# Patient Record
Sex: Male | Born: 1945
Health system: Southern US, Community
[De-identification: ages and names within clinical notes are randomized; demographics above are authoritative.]

## PROBLEM LIST (undated history)

## (undated) DIAGNOSIS — M109 Gout, unspecified: Secondary | ICD-10-CM

## (undated) DIAGNOSIS — Z862 Personal history of diseases of the blood and blood-forming organs and certain disorders involving the immune mechanism: Secondary | ICD-10-CM

## (undated) DIAGNOSIS — E782 Mixed hyperlipidemia: Secondary | ICD-10-CM

## (undated) DIAGNOSIS — N2889 Other specified disorders of kidney and ureter: Secondary | ICD-10-CM

## (undated) DIAGNOSIS — K2270 Barrett's esophagus without dysplasia: Secondary | ICD-10-CM

## (undated) DIAGNOSIS — F419 Anxiety disorder, unspecified: Secondary | ICD-10-CM

## (undated) DIAGNOSIS — C649 Malignant neoplasm of unspecified kidney, except renal pelvis: Secondary | ICD-10-CM

## (undated) DIAGNOSIS — I10 Essential (primary) hypertension: Secondary | ICD-10-CM

## (undated) DIAGNOSIS — R011 Cardiac murmur, unspecified: Secondary | ICD-10-CM

## (undated) DIAGNOSIS — C641 Malignant neoplasm of right kidney, except renal pelvis: Secondary | ICD-10-CM

## (undated) DIAGNOSIS — Z973 Presence of spectacles and contact lenses: Secondary | ICD-10-CM

## (undated) DIAGNOSIS — K219 Gastro-esophageal reflux disease without esophagitis: Secondary | ICD-10-CM

---

## 2012-09-04 HISTORY — PX: INGUINAL HERNIA REPAIR: SUR1180

## 2015-08-30 DIAGNOSIS — K2270 Barrett's esophagus without dysplasia: Secondary | ICD-10-CM | POA: Insufficient documentation

## 2015-08-30 DIAGNOSIS — M109 Gout, unspecified: Secondary | ICD-10-CM | POA: Insufficient documentation

## 2017-05-05 DIAGNOSIS — H18519 Endothelial corneal dystrophy, unspecified eye: Secondary | ICD-10-CM | POA: Insufficient documentation

## 2018-05-15 ENCOUNTER — Emergency Department (HOSPITAL_BASED_OUTPATIENT_CLINIC_OR_DEPARTMENT_OTHER)
Admission: EM | Admit: 2018-05-15 | Discharge: 2018-05-15 | Disposition: A | Discharge status: Home / Self Care | Payer: Medicare Other | Attending: Emergency Medicine | Admitting: Emergency Medicine

## 2018-05-15 ENCOUNTER — Other Ambulatory Visit: Payer: Self-pay

## 2018-05-15 ENCOUNTER — Encounter (HOSPITAL_BASED_OUTPATIENT_CLINIC_OR_DEPARTMENT_OTHER): Payer: Self-pay | Admitting: Emergency Medicine

## 2018-05-15 ENCOUNTER — Emergency Department (HOSPITAL_BASED_OUTPATIENT_CLINIC_OR_DEPARTMENT_OTHER): Payer: Medicare Other

## 2018-05-15 DIAGNOSIS — R319 Hematuria, unspecified: Secondary | ICD-10-CM | POA: Insufficient documentation

## 2018-05-15 DIAGNOSIS — I10 Essential (primary) hypertension: Secondary | ICD-10-CM | POA: Insufficient documentation

## 2018-05-15 DIAGNOSIS — Z7982 Long term (current) use of aspirin: Secondary | ICD-10-CM | POA: Diagnosis not present

## 2018-05-15 DIAGNOSIS — N2889 Other specified disorders of kidney and ureter: Secondary | ICD-10-CM | POA: Diagnosis not present

## 2018-05-15 DIAGNOSIS — Z79899 Other long term (current) drug therapy: Secondary | ICD-10-CM | POA: Diagnosis not present

## 2018-05-15 DIAGNOSIS — Z87891 Personal history of nicotine dependence: Secondary | ICD-10-CM | POA: Insufficient documentation

## 2018-05-15 HISTORY — DX: Gout, unspecified: M10.9

## 2018-05-15 HISTORY — DX: Essential (primary) hypertension: I10

## 2018-05-15 LAB — CBC
HCT: 43.3 % (ref 39.0–52.0)
Hemoglobin: 15.2 g/dL (ref 13.0–17.0)
MCH: 32.2 pg (ref 26.0–34.0)
MCHC: 35.1 g/dL (ref 30.0–36.0)
MCV: 91.7 fL (ref 80.0–100.0)
Platelets: 233 10*3/uL (ref 150–400)
RBC: 4.72 MIL/uL (ref 4.22–5.81)
RDW: 12 % (ref 11.5–15.5)
WBC: 9.7 10*3/uL (ref 4.0–10.5)
nRBC: 0 % (ref 0.0–0.2)

## 2018-05-15 LAB — URINALYSIS, ROUTINE W REFLEX MICROSCOPIC

## 2018-05-15 LAB — BASIC METABOLIC PANEL
Anion gap: 9 (ref 5–15)
BUN: 18 mg/dL (ref 8–23)
CO2: 24 mmol/L (ref 22–32)
Calcium: 8.9 mg/dL (ref 8.9–10.3)
Chloride: 105 mmol/L (ref 98–111)
Creatinine, Ser: 1.28 mg/dL — ABNORMAL HIGH (ref 0.61–1.24)
GFR calc Af Amer: >60 mL/min (ref >60)
GFR calc non Af Amer: 54 mL/min — ABNORMAL LOW (ref >60)
Glucose, Bld: 122 mg/dL — ABNORMAL HIGH (ref 70–99)
Potassium: 3.8 mmol/L (ref 3.5–5.1)
Sodium: 138 mmol/L (ref 135–145)

## 2018-05-15 LAB — URINALYSIS, MICROSCOPIC (REFLEX): RBC / HPF: >50 RBC/hpf (ref 0–5)

## 2018-05-15 LAB — PROTIME-INR
INR: 1
Prothrombin Time: 13.1 seconds (ref 11.4–15.2)

## 2018-05-15 IMAGING — CT CT RENAL STONE PROTOCOL
2 of 4 series · 16 of 46 positions shown, 18 images · non-contrast
Comparison: None.

CLINICAL DATA: Hematuria and lower abdominal pain since this
morning.

EXAM:
CT ABDOMEN AND PELVIS WITHOUT CONTRAST
TECHNIQUE: Multidetector CT imaging of the abdomen and pelvis was performed
following the standard protocol without IV contrast.

[Series 2: axial st · axial · 0.77mm/px · z∈[+751,+1111]mm · 13 of 80 slices shown, 15 images]
[im 4/80  soft-tissue]
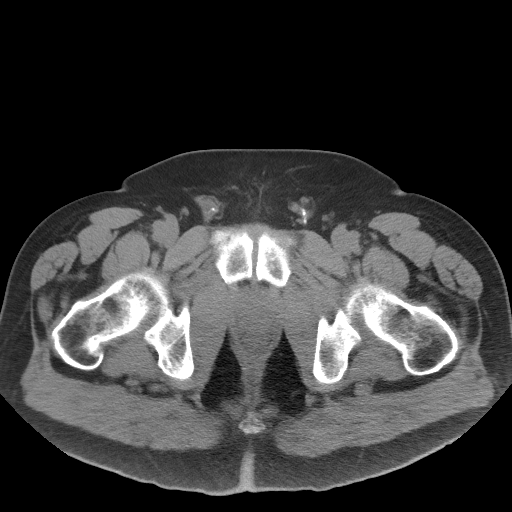
[im 4/80  bone]
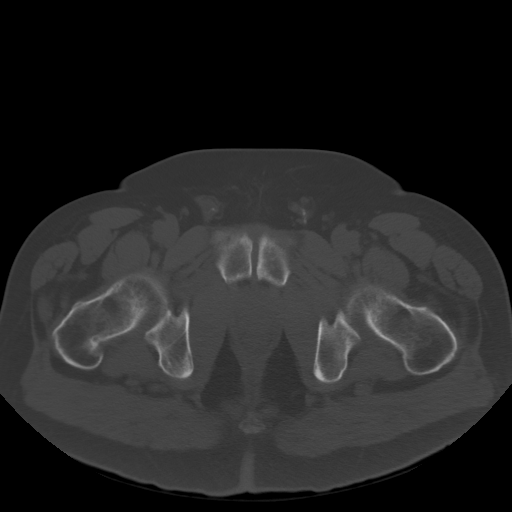
[im 10/80  soft-tissue]
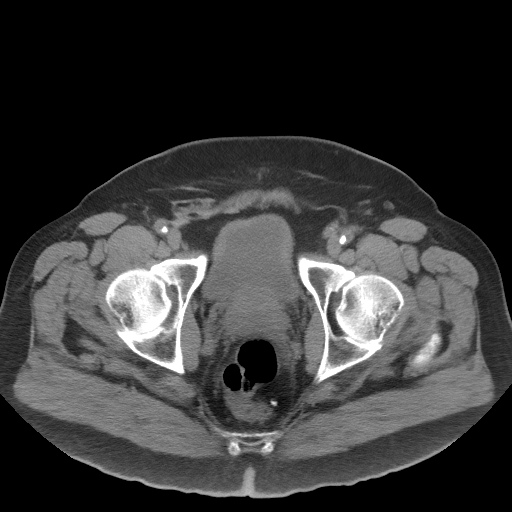
[im 16/80  soft-tissue]
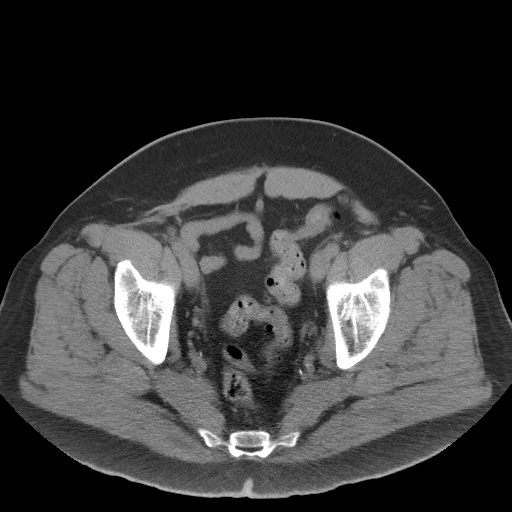
[im 23/80  soft-tissue]
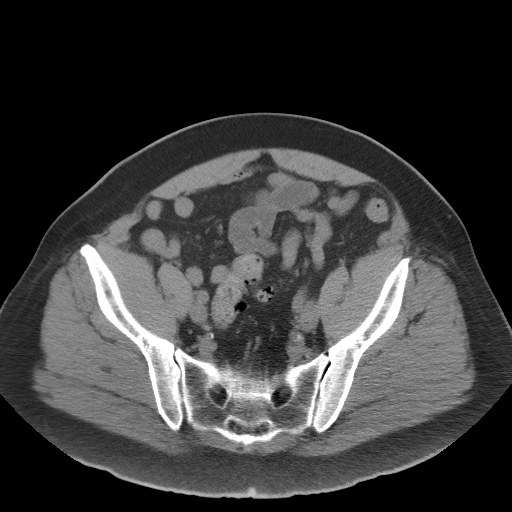
[im 29/80  soft-tissue]
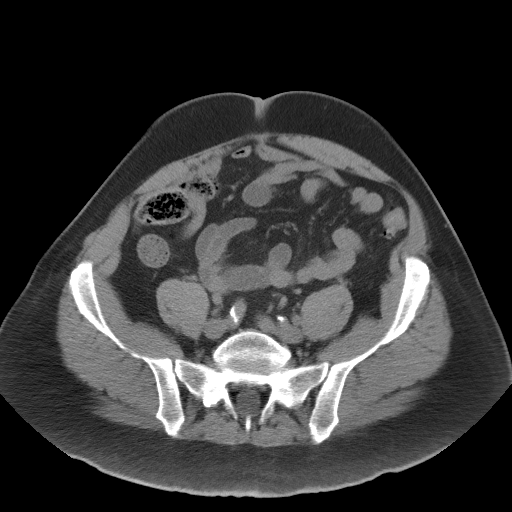
[im 35/80  soft-tissue]
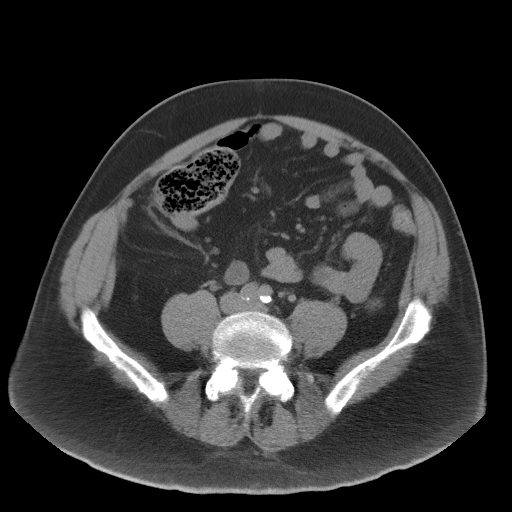
[im 42/80  soft-tissue]
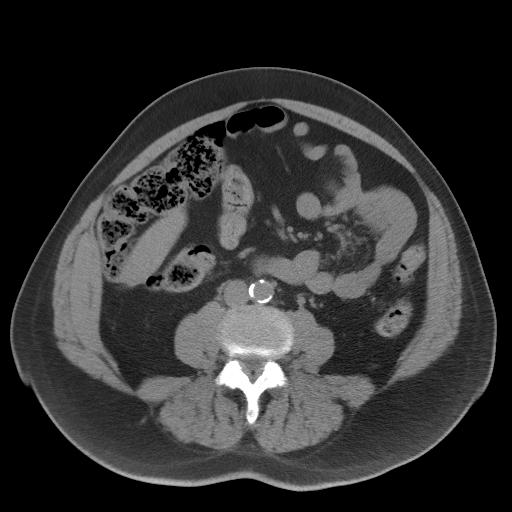
[im 45/80  soft-tissue]
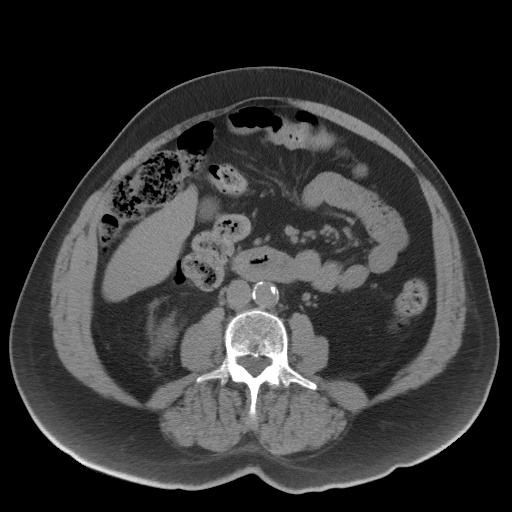
[im 51/80  soft-tissue]
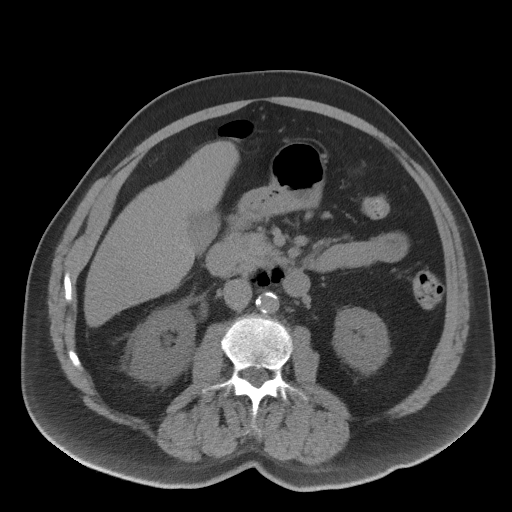
[im 51/80  bone]
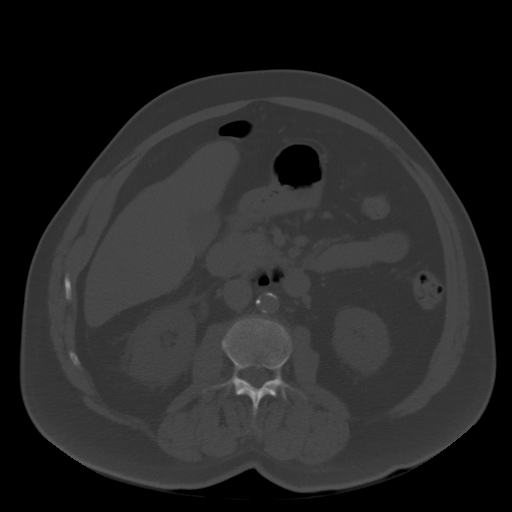
[im 57/80  soft-tissue]
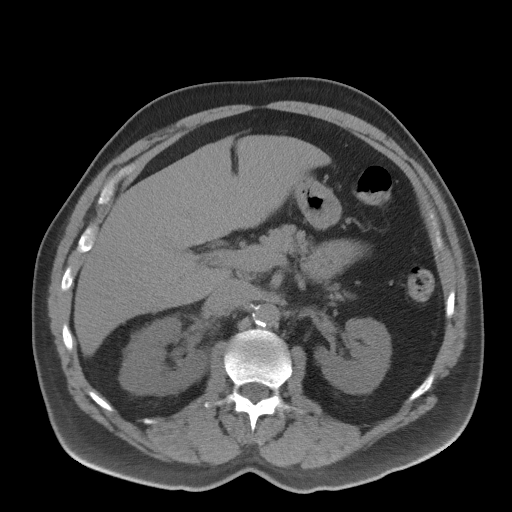
[im 64/80  soft-tissue]
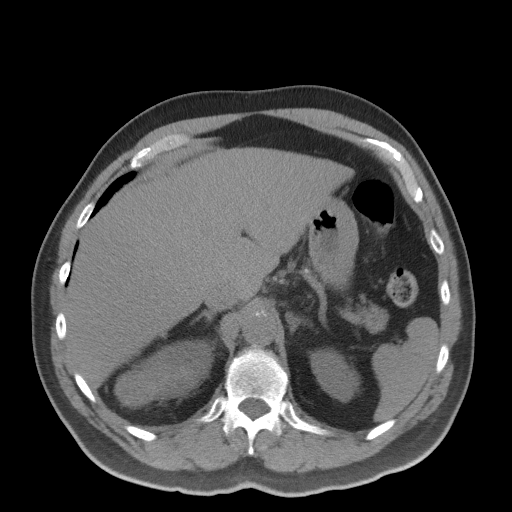
[im 70/80  soft-tissue]
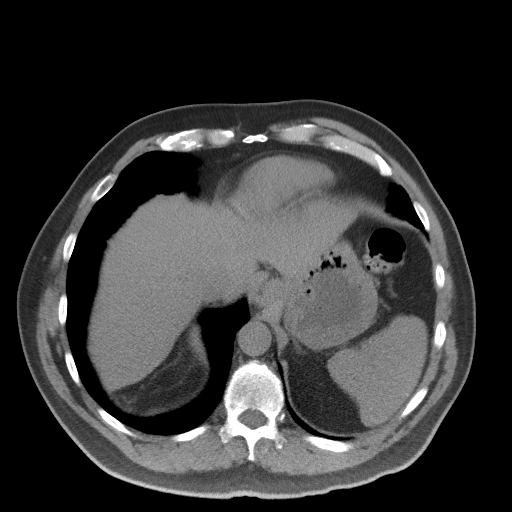
[im 76/80  soft-tissue]
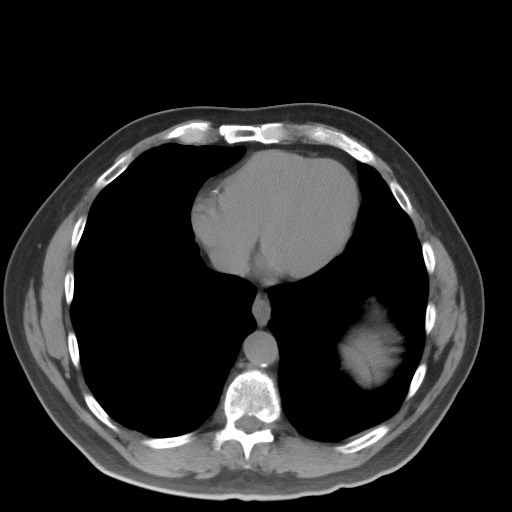

[Series 5: coronal st · coronal · 0.75mm/px · 3 of 107 slices shown]
[im 36/107  soft-tissue]
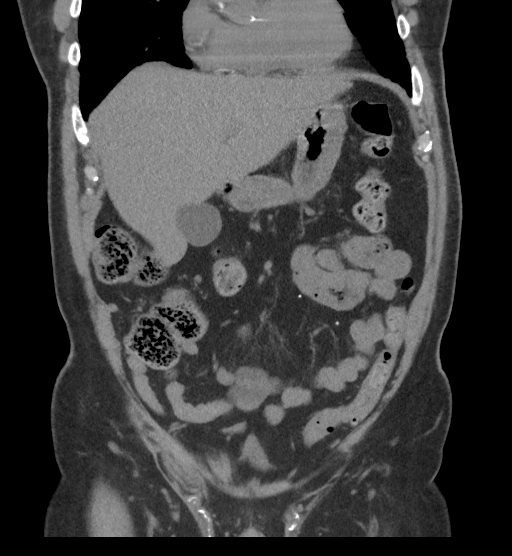
[im 48/107  soft-tissue]
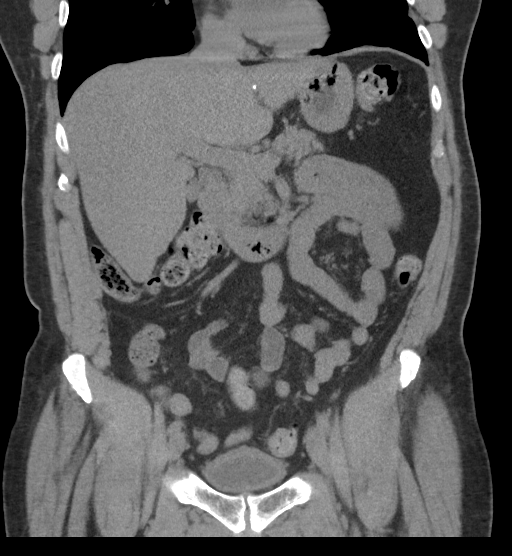
[im 59/107  soft-tissue]
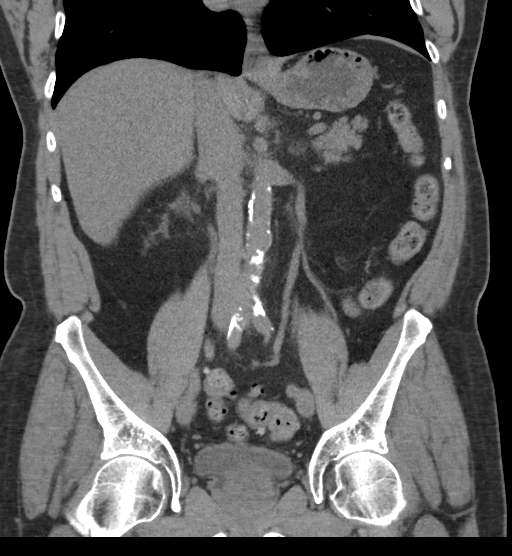

[16 of 46 positions shown; findings below may reference images not displayed]

FINDINGS: Lower chest: 1 cm nodule, right middle lobe. 3 mm nodule, subpleural
left lower lobe. No acute findings.

Hepatobiliary: No focal liver abnormality is seen. No gallstones,
gallbladder wall thickening, or biliary dilatation.

Pancreas: Unremarkable. No pancreatic ductal dilatation or
surrounding inflammatory changes.

Spleen: Normal in size without focal abnormality.

Adrenals/Urinary Tract: No adrenal masses.

Mass arises from the lateral mid to upper pole the right kidney
measuring 5.2 x 3.4 x 3.7 cm. There is mild prominence of the right
intrarenal collecting system and right perinephric stranding. Right
ureter is mildly dilated and there is increased attenuation material
distal right ureter beginning at the pelvic brim, which is likely
hemorrhage. There is no renal or ureteral stone. No other renal
masses. Left renal collecting system and ureter are unremarkable.

Bladder is mildly distended.  No bladder mass or stone.

Stomach/Bowel: Stomach and small bowel unremarkable. There are
scattered left colon diverticula. No diverticulitis or other colonic
inflammatory process. Normal appendix visualized.

Vascular/Lymphatic: Aortic atherosclerosis. No aneurysm. No
adenopathy.

Reproductive: Prominent prostate measuring 4.5 x 3.4 cm
transversely.

Other: No abdominal wall hernia or abnormality. No abdominopelvic
ascites.

Musculoskeletal: No fracture or acute finding. No osteoblastic or
osteolytic lesions.
IMPRESSION: 1. 5.2 cm mass in the right kidney. This is relatively
hyperattenuating measuring 32 Hounsfield units. Renal cell carcinoma
is suspected, which would be better assessed with renal MRI with and
without contrast or post contrast CT of the abdomen. There is mild
prominence of the right intrarenal collecting system with mild
dilation of the right ureter. Increased attenuation material in the
distal right ureter is likely hemorrhage. There is no ureteral or
renal stone. Two lung base nodules, 1 in the right middle lobe, 1
cm, with a 3 mm left lower lobe nodule, concerning for metastatic
disease.
2. No other evidence of metastatic disease. No other findings to
account for pain or hematuria.
3. Aortic atherosclerosis. Colonic diverticula without
diverticulitis.

## 2018-05-15 MED ORDER — HYDROCODONE-ACETAMINOPHEN 5-325 MG PO TABS: 1.0000 | ORAL_TABLET | ORAL | 0 refills | Status: DC | PRN

## 2018-05-15 MED ORDER — KETOROLAC TROMETHAMINE 30 MG/ML IJ SOLN
15.0000 mg | Freq: Once | INTRAMUSCULAR | Status: AC
  Administered 2018-05-15: 15 mg via INTRAVENOUS
  Filled 2018-05-15: qty 1

## 2018-05-15 MED ORDER — MORPHINE SULFATE (PF) 4 MG/ML IV SOLN
4.0000 mg | Freq: Once | INTRAVENOUS | Status: AC
  Administered 2018-05-15: 4 mg via INTRAVENOUS
  Filled 2018-05-15: qty 1

## 2018-05-15 MED ORDER — ONDANSETRON HCL 4 MG/2ML IJ SOLN
4.0000 mg | Freq: Once | INTRAMUSCULAR | Status: AC
  Administered 2018-05-15: 4 mg via INTRAVENOUS
  Filled 2018-05-15: qty 2

## 2018-05-15 NOTE — ED Notes (Signed)
ED Provider at bedside. 

## 2018-05-15 NOTE — ED Notes (Signed)
Pt on auto VS  

## 2018-05-15 NOTE — ED Provider Notes (Signed)
[facility] Provider Note   CSN: [id number] Arrival date & time: 05/15/18  0935     History   Chief Complaint Chief Complaint  Patient presents with  . Hematuria    HPI James Massey is a 72 y.o. male.  HPI Patient presented to the emergency room for evaluation hematuria.  Patient states he noticed it this morning.  The urine is grossly bloody.  He mentions and there is having some right lower quadrant discomfort but denies any pain to me.  He denies any fevers or chills.  No history of prior hematuria.  No recent injuries.  No known history of kidney stones or bladder polyps. Past Medical History:  Diagnosis Date  . Gout   . High cholesterol   . Hypertension     There are no active problems to display for this patient.   Past Surgical History:  Procedure Laterality Date  . HERNIA REPAIR          Home Medications    Prior to Admission medications   Medication Sig Start Date End Date Taking? Authorizing Provider  allopurinol (ZYLOPRIM) 300 MG tablet Take 300 mg by mouth daily.   Yes [provider]  aspirin EC 81 MG tablet Take 81 mg by mouth daily.   Yes [provider]  atorvastatin (LIPITOR) 40 MG tablet Take 40 mg by mouth daily.   Yes [provider]  diazepam (VALIUM) 5 MG tablet Take 5 mg by mouth every 12 (twelve) hours as needed for anxiety. As needed (usually break it in half)   Yes [provider]  indomethacin (INDOCIN) 50 MG capsule Take 50 mg by mouth 3 (three) times daily as needed.   Yes [provider]  pantoprazole (PROTONIX) 40 MG tablet Take 40 mg by mouth 2 (two) times daily.   Yes [provider]  simethicone (MYLICON) 458 MG chewable tablet Chew 180 mg by mouth every 6 (six) hours as needed for flatulence. OTC for stomach gas relief   Yes [provider]  HYDROcodone-acetaminophen (NORCO/VICODIN) 5-325 MG tablet Take 1 tablet by mouth every 4 (four) hours  as needed. 05/15/18   Dorie Rank, MD    Family History No family history on file.  Social History Social History   Tobacco Use  . Smoking status: Former Research scientist (life sciences)  . Smokeless tobacco: Never Used  Substance Use Topics  . Alcohol use: Yes  . Drug use: Never     Allergies   Clindamycin/lincomycin   Review of Systems Review of Systems  Constitutional: Negative for fever.  Respiratory: Negative for shortness of breath.   Cardiovascular: Negative for chest pain.  All other systems reviewed and are negative.    Physical Exam Updated Vital Signs BP (!) 143/89 (BP Location: Left Arm)   Pulse 70   Temp 98.2 F (36.8 C) (Oral)   Resp 18   Ht 1.753 m (5\' 9" )   Wt 100.7 kg   SpO2 94%   BMI 32.78 kg/m   Physical Exam  Constitutional: He appears well-developed and well-nourished. No distress.  HENT:  Head: Normocephalic and atraumatic.  Right Ear: External ear normal.  Left Ear: External ear normal.  Eyes: Conjunctivae are normal. Right eye exhibits no discharge. Left eye exhibits no discharge. No scleral icterus.  Neck: Neck supple. No tracheal deviation present.  Cardiovascular: Normal rate, regular rhythm and intact distal pulses.  Pulmonary/Chest: Effort normal and breath sounds normal. No stridor. No respiratory distress. He has no  wheezes. He has no rales.  Abdominal: Soft. Bowel sounds are normal. He exhibits no distension. There is no tenderness. There is no rebound and no guarding.  Musculoskeletal: He exhibits no edema or tenderness.  Neurological: He is alert. He has normal strength. No cranial nerve deficit (no facial droop, extraocular movements intact, no slurred speech) or sensory deficit. He exhibits normal muscle tone. He displays no seizure activity. Coordination normal.  Skin: Skin is warm and dry. No rash noted.  Psychiatric: He has a normal mood and affect.  Nursing note and vitals reviewed.    ED Treatments / Results  Labs (all labs ordered are  listed, but only abnormal results are displayed) Labs Reviewed  URINALYSIS, ROUTINE W REFLEX MICROSCOPIC - Abnormal; Notable for the following components:      Result Value   Color, Urine RED (*)    APPearance TURBID (*)    Glucose, UA   (*)    Value: TEST NOT REPORTED DUE TO COLOR INTERFERENCE OF URINE PIGMENT   Hgb urine dipstick   (*)    Value: TEST NOT REPORTED DUE TO COLOR INTERFERENCE OF URINE PIGMENT   Bilirubin Urine   (*)    Value: TEST NOT REPORTED DUE TO COLOR INTERFERENCE OF URINE PIGMENT   Ketones, ur   (*)    Value: TEST NOT REPORTED DUE TO COLOR INTERFERENCE OF URINE PIGMENT   Protein, ur   (*)    Value: TEST NOT REPORTED DUE TO COLOR INTERFERENCE OF URINE PIGMENT   Nitrite   (*)    Value: TEST NOT REPORTED DUE TO COLOR INTERFERENCE OF URINE PIGMENT   Leukocytes, UA   (*)    Value: TEST NOT REPORTED DUE TO COLOR INTERFERENCE OF URINE PIGMENT   All other components within normal limits  BASIC METABOLIC PANEL - Abnormal; Notable for the following components:   Glucose, Bld 122 (*)    Creatinine, Ser 1.28 (*)    GFR calc non Af Amer 54 (*)    All other components within normal limits  URINALYSIS, MICROSCOPIC (REFLEX) - Abnormal; Notable for the following components:   Bacteria, UA MANY (*)    All other components within normal limits  URINE CULTURE  CBC  PROTIME-INR     Radiology Ct Renal Stone Study  Result Date: 05/15/2018 CLINICAL DATA:  Hematuria and lower abdominal pain since this morning. EXAM: CT ABDOMEN AND PELVIS WITHOUT CONTRAST TECHNIQUE: Multidetector CT imaging of the abdomen and pelvis was performed following the standard protocol without IV contrast. COMPARISON:  None. FINDINGS: Lower chest: 1 cm nodule, right middle lobe. 3 mm nodule, subpleural left lower lobe. No acute findings. Hepatobiliary: No focal liver abnormality is seen. No gallstones, gallbladder wall thickening, or biliary dilatation. Pancreas: Unremarkable. No pancreatic ductal dilatation  or surrounding inflammatory changes. Spleen: Normal in size without focal abnormality. Adrenals/Urinary Tract: No adrenal masses. Mass arises from the lateral mid to upper pole the right kidney measuring 5.2 x 3.4 x 3.7 cm. There is mild prominence of the right intrarenal collecting system and right perinephric stranding. Right ureter is mildly dilated and there is increased attenuation material distal right ureter beginning at the pelvic brim, which is likely hemorrhage. There is no renal or ureteral stone. No other renal masses. Left renal collecting system and ureter are unremarkable. Bladder is mildly distended.  No bladder mass or stone. Stomach/Bowel: Stomach and small bowel unremarkable. There are scattered left colon diverticula. No diverticulitis or other colonic inflammatory process. Normal appendix visualized.  Vascular/Lymphatic: Aortic atherosclerosis. No aneurysm. No adenopathy. Reproductive: Prominent prostate measuring 4.5 x 3.4 cm transversely. Other: No abdominal wall hernia or abnormality. No abdominopelvic ascites. Musculoskeletal: No fracture or acute finding. No osteoblastic or osteolytic lesions. IMPRESSION: 1. 5.2 cm mass in the right kidney. This is relatively hyperattenuating measuring 32 Hounsfield units. Renal cell carcinoma is suspected, which would be better assessed with renal MRI with and without contrast or post contrast CT of the abdomen. There is mild prominence of the right intrarenal collecting system with mild dilation of the right ureter. Increased attenuation material in the distal right ureter is likely hemorrhage. There is no ureteral or renal stone. Two lung base nodules, 1 in the right middle lobe, 1 cm, with a 3 mm left lower lobe nodule, concerning for metastatic disease. 2. No other evidence of metastatic disease. No other findings to account for pain or hematuria. 3. Aortic atherosclerosis. Colonic diverticula without diverticulitis. Electronically Signed   By: Lajean Manes M.D.   On: 05/15/2018 10:54    Procedures Procedures (including critical care time)  Medications Ordered in ED Medications  ketorolac (TORADOL) 30 MG/ML injection 15 mg (has no administration in time range)  morphine 4 MG/ML injection 4 mg (4 mg Intravenous Given 05/15/18 1100)  ondansetron (ZOFRAN) injection 4 mg (4 mg Intravenous Given 05/15/18 1100)     Initial Impression / Assessment and Plan / ED Course  I have reviewed the triage vital signs and the nursing notes.  Pertinent labs & imaging results that were available during my care of the patient were reviewed by me and considered in my medical decision making (see chart for details).  Clinical Course as of May 15 1236  Sat May 15, 2018  1049 Now complains of pain   [JK]  1138 Labs reviewed.  Some bacturia, primarily hematuria.  CT scan shows renal mass.   [JK]    Clinical Course User Index [JK] Dorie Rank, MD   Patient presented to the emergency room for evaluation of hematuria.  Unfortunately his CT scan shows a right renal mass concerning for renal cell carcinoma.  Patient's urinalysis does show some bacteria but I doubt infection.  I will send off a urine culture.  I discussed the case with Dr. Alyson Ingles.  He would like to see the patient in the office this week.  I discussed the findings with the patient including the possibility of renal cell carcinoma and the need for follow-up.  He will be discharged home with prescription for hydrocodone to help him with his pain.   Discussed need to return to the ED if he is unable to urinate  Final Clinical Impressions(s) / ED Diagnoses   Final diagnoses:  Hematuria  Renal mass, right    ED Discharge Orders         Ordered    HYDROcodone-acetaminophen (NORCO/VICODIN) 5-325 MG tablet  Every 4 hours PRN     05/15/18 1236           Dorie Rank, MD 05/15/18 1239

## 2018-05-15 NOTE — ED Triage Notes (Signed)
Pt in for blood in urine x 2 onset this morning. Pt c/o Right lower quadrant pain. Pt reports some nausea.

## 2018-05-15 NOTE — Discharge Instructions (Addendum)
CT scan showed a 5 cm mass in the right kidney.  This is concerning for the possibility of renal carcinoma.  This will need to be evaluated further.  I spoke with Dr Alyson Ingles today.  Call his office to arrange a follow up appointment next week.   Return to an ED if you start to have difficulty urinating and cant empty your bladder.

## 2018-05-17 LAB — URINE CULTURE: Culture: NO GROWTH

## 2018-05-18 ENCOUNTER — Other Ambulatory Visit: Payer: Self-pay | Admitting: Urology

## 2018-05-19 ENCOUNTER — Encounter (HOSPITAL_COMMUNITY): Payer: Self-pay | Admitting: *Deleted

## 2018-05-19 ENCOUNTER — Other Ambulatory Visit: Payer: Self-pay

## 2018-05-20 NOTE — Anesthesia Preprocedure Evaluation (Addendum)
Anesthesia Evaluation  Patient identified by MRN, date of birth, ID band Patient awake    Reviewed: Allergy & Precautions, NPO status , Patient's Chart, lab work & pertinent test results  Airway Mallampati: II  TM Distance: >3 FB Neck ROM: Full    Dental no notable dental hx. (+) Caps, Teeth Intact, Dental Advisory Given   Pulmonary former smoker,    Pulmonary exam normal breath sounds clear to auscultation       Cardiovascular Exercise Tolerance: Good hypertension, Pt. on medications Normal cardiovascular exam Rhythm:Regular Rate:Normal  05/21/18 EKG NSR   Neuro/Psych Anxiety    GI/Hepatic Neg liver ROS, GERD  Medicated and Controlled,  Endo/Other    Renal/GU      Musculoskeletal negative musculoskeletal ROS (+) Hx of gout   Abdominal (+) + obese,   Peds  Hematology negative hematology ROS (+)   Anesthesia Other Findings   Reproductive/Obstetrics                            Lab Results  Component Value Date   CREATININE 1.28 (H) 05/15/2018   BUN 18 05/15/2018   NA 138 05/15/2018   K 3.8 05/15/2018   CL 105 05/15/2018   CO2 24 05/15/2018    Lab Results  Component Value Date   WBC 9.7 05/15/2018   HGB 15.2 05/15/2018   HCT 43.3 05/15/2018   MCV 91.7 05/15/2018   PLT 233 05/15/2018    Anesthesia Physical Anesthesia Plan  ASA: II  Anesthesia Plan: General   Post-op Pain Management:    Induction: Intravenous  PONV Risk Score and Plan: 2 and Treatment may vary due to age or medical condition, Ondansetron and Dexamethasone  Airway Management Planned: LMA  Additional Equipment:   Intra-op Plan:   Post-operative Plan:   Informed Consent: I have reviewed the patients History and Physical, chart, labs and discussed the procedure including the risks, benefits and alternatives for the proposed anesthesia with the patient or authorized representative who has indicated  his/her understanding and acceptance.   Dental advisory given  Plan Discussed with: CRNA  Anesthesia Plan Comments:         Anesthesia Quick Evaluation

## 2018-05-21 ENCOUNTER — Ambulatory Visit (HOSPITAL_COMMUNITY): Payer: Medicare Other

## 2018-05-21 ENCOUNTER — Other Ambulatory Visit: Payer: Self-pay

## 2018-05-21 ENCOUNTER — Ambulatory Visit (HOSPITAL_COMMUNITY): Payer: Medicare Other | Admitting: Anesthesiology

## 2018-05-21 ENCOUNTER — Encounter (HOSPITAL_COMMUNITY)
Admission: RE | Disposition: A | Discharge status: Home / Self Care | Payer: Self-pay | Source: Ambulatory Visit | Attending: Urology

## 2018-05-21 ENCOUNTER — Encounter (HOSPITAL_COMMUNITY): Payer: Self-pay | Admitting: *Deleted

## 2018-05-21 ENCOUNTER — Ambulatory Visit (HOSPITAL_COMMUNITY)
Admission: RE | Admit: 2018-05-21 | Discharge: 2018-05-21 | Disposition: A | Discharge status: Home / Self Care | Payer: Medicare Other | Source: Ambulatory Visit | Attending: Urology | Admitting: Urology

## 2018-05-21 DIAGNOSIS — F419 Anxiety disorder, unspecified: Secondary | ICD-10-CM | POA: Insufficient documentation

## 2018-05-21 DIAGNOSIS — K219 Gastro-esophageal reflux disease without esophagitis: Secondary | ICD-10-CM | POA: Diagnosis not present

## 2018-05-21 DIAGNOSIS — I10 Essential (primary) hypertension: Secondary | ICD-10-CM | POA: Insufficient documentation

## 2018-05-21 DIAGNOSIS — Z79899 Other long term (current) drug therapy: Secondary | ICD-10-CM | POA: Diagnosis not present

## 2018-05-21 DIAGNOSIS — M109 Gout, unspecified: Secondary | ICD-10-CM | POA: Diagnosis not present

## 2018-05-21 DIAGNOSIS — Z7982 Long term (current) use of aspirin: Secondary | ICD-10-CM | POA: Insufficient documentation

## 2018-05-21 DIAGNOSIS — N2889 Other specified disorders of kidney and ureter: Secondary | ICD-10-CM | POA: Diagnosis not present

## 2018-05-21 DIAGNOSIS — R310 Gross hematuria: Secondary | ICD-10-CM | POA: Diagnosis not present

## 2018-05-21 DIAGNOSIS — Z87891 Personal history of nicotine dependence: Secondary | ICD-10-CM | POA: Diagnosis not present

## 2018-05-21 HISTORY — PX: CYSTOSCOPY WITH RETROGRADE PYELOGRAM, URETEROSCOPY AND STENT PLACEMENT: SHX5789

## 2018-05-21 HISTORY — DX: Cardiac murmur, unspecified: R01.1

## 2018-05-21 HISTORY — DX: Anxiety disorder, unspecified: F41.9

## 2018-05-21 SURGERY — CYSTOURETEROSCOPY, WITH RETROGRADE PYELOGRAM AND STENT INSERTION
Anesthesia: General | Site: Ureter | Laterality: Right

## 2018-05-21 MED ORDER — BELLADONNA ALKALOIDS-OPIUM 16.2-60 MG RE SUPP
RECTAL | Status: DC | PRN
  Administered 2018-05-21: 1 via RECTAL

## 2018-05-21 MED ORDER — ONDANSETRON HCL 4 MG/2ML IJ SOLN
INTRAMUSCULAR | Status: AC
  Filled 2018-05-21: qty 2

## 2018-05-21 MED ORDER — BELLADONNA ALKALOIDS-OPIUM 16.2-30 MG RE SUPP
RECTAL | Status: AC
  Filled 2018-05-21: qty 1

## 2018-05-21 MED ORDER — SODIUM CHLORIDE 0.9 % IR SOLN
Status: DC | PRN
  Administered 2018-05-21: 1000 mL

## 2018-05-21 MED ORDER — SODIUM CHLORIDE 0.9 % IR SOLN
Status: DC | PRN
  Administered 2018-05-21: 4000 mL

## 2018-05-21 MED ORDER — DEXAMETHASONE SODIUM PHOSPHATE 10 MG/ML IJ SOLN
INTRAMUSCULAR | Status: AC
  Filled 2018-05-21: qty 1

## 2018-05-21 MED ORDER — PHENAZOPYRIDINE HCL 200 MG PO TABS: 200.0000 mg | ORAL_TABLET | Freq: Three times a day (TID) | ORAL | 0 refills | Status: DC | PRN

## 2018-05-21 MED ORDER — LIDOCAINE 2% (20 MG/ML) 5 ML SYRINGE
INTRAMUSCULAR | Status: DC | PRN
  Administered 2018-05-21: 100 mg via INTRAVENOUS

## 2018-05-21 MED ORDER — PROPOFOL 10 MG/ML IV BOLUS
INTRAVENOUS | Status: AC
  Filled 2018-05-21: qty 20

## 2018-05-21 MED ORDER — DEXAMETHASONE SODIUM PHOSPHATE 10 MG/ML IJ SOLN
INTRAMUSCULAR | Status: DC | PRN
  Administered 2018-05-21: 10 mg via INTRAVENOUS

## 2018-05-21 MED ORDER — TRAMADOL HCL 50 MG PO TABS: 50.0000 mg | ORAL_TABLET | Freq: Four times a day (QID) | ORAL | 0 refills | Status: DC | PRN

## 2018-05-21 MED ORDER — SODIUM CHLORIDE 0.9 % IV SOLN
INTRAVENOUS | Status: DC | PRN
  Administered 2018-05-21: 20 mL

## 2018-05-21 MED ORDER — FENTANYL CITRATE (PF) 100 MCG/2ML IJ SOLN: 25.0000 ug | INTRAMUSCULAR | Status: DC | PRN

## 2018-05-21 MED ORDER — ONDANSETRON HCL 4 MG/2ML IJ SOLN: 4.0000 mg | Freq: Once | INTRAMUSCULAR | Status: DC | PRN

## 2018-05-21 MED ORDER — FENTANYL CITRATE (PF) 100 MCG/2ML IJ SOLN
INTRAMUSCULAR | Status: AC
  Filled 2018-05-21: qty 2

## 2018-05-21 MED ORDER — PROPOFOL 10 MG/ML IV BOLUS
INTRAVENOUS | Status: DC | PRN
  Administered 2018-05-21: 200 mg via INTRAVENOUS

## 2018-05-21 MED ORDER — CEFAZOLIN SODIUM-DEXTROSE 2-4 GM/100ML-% IV SOLN
2.0000 g | INTRAVENOUS | Status: AC
  Administered 2018-05-21: 2 g via INTRAVENOUS
  Filled 2018-05-21: qty 100

## 2018-05-21 MED ORDER — FENTANYL CITRATE (PF) 100 MCG/2ML IJ SOLN
INTRAMUSCULAR | Status: DC | PRN
  Administered 2018-05-21 (×4): 25 ug via INTRAVENOUS

## 2018-05-21 MED ORDER — LACTATED RINGERS IV SOLN
INTRAVENOUS | Status: DC
  Administered 2018-05-21: 10:00:00 via INTRAVENOUS

## 2018-05-21 MED ORDER — KETOROLAC TROMETHAMINE 15 MG/ML IJ SOLN: 15.0000 mg | Freq: Once | INTRAMUSCULAR | Status: DC | PRN

## 2018-05-21 MED ORDER — ACETAMINOPHEN 10 MG/ML IV SOLN: 1000.0000 mg | Freq: Once | INTRAVENOUS | Status: DC | PRN

## 2018-05-21 MED ORDER — LIDOCAINE 2% (20 MG/ML) 5 ML SYRINGE
INTRAMUSCULAR | Status: AC
  Filled 2018-05-21: qty 5

## 2018-05-21 MED ORDER — ONDANSETRON HCL 4 MG/2ML IJ SOLN
INTRAMUSCULAR | Status: DC | PRN
  Administered 2018-05-21: 4 mg via INTRAVENOUS

## 2018-05-21 SURGICAL SUPPLY — 21 items
BAG URO CATCHER STRL LF (MISCELLANEOUS) ×2 IMPLANT
CATH URET 5FR 28IN OPEN ENDED (CATHETERS) ×2 IMPLANT
CATH URET DUAL LUMEN 6-10FR 50 (CATHETERS) ×2 IMPLANT
CLOTH BEACON ORANGE TIMEOUT ST (SAFETY) ×2 IMPLANT
GLOVE BIO SURGEON STRL SZ 6.5 (GLOVE) ×2 IMPLANT
GLOVE BIOGEL M STRL SZ7.5 (GLOVE) ×2 IMPLANT
GLOVE BIOGEL PI IND STRL 7.0 (GLOVE) ×1 IMPLANT
GLOVE BIOGEL PI INDICATOR 7.0 (GLOVE) ×1
GOWN STRL REUS W/ TWL LRG LVL3 (GOWN DISPOSABLE) ×1 IMPLANT
GOWN STRL REUS W/TWL LRG LVL3 (GOWN DISPOSABLE) ×1
GOWN STRL REUS W/TWL XL LVL3 (GOWN DISPOSABLE) ×2 IMPLANT
GUIDEWIRE ANG ZIPWIRE 038X150 (WIRE) IMPLANT
GUIDEWIRE STR DUAL SENSOR (WIRE) ×4 IMPLANT
KIT BALLIN UROMAX 15FX10 (LABEL) ×1 IMPLANT
MANIFOLD NEPTUNE II (INSTRUMENTS) ×2 IMPLANT
PACK CYSTO (CUSTOM PROCEDURE TRAY) ×2 IMPLANT
SET HIGH PRES BAL DIL (LABEL) ×1
STENT URET 6FRX26 CONTOUR (STENTS) ×2 IMPLANT
TUBING CONNECTING 10 (TUBING) ×2 IMPLANT
TUBING UROLOGY SET (TUBING) ×2 IMPLANT
WIRE COONS/BENSON .038X145CM (WIRE) IMPLANT

## 2018-05-21 NOTE — Transfer of Care (Signed)
Immediate Anesthesia Transfer of Care Note  Patient: James Massey  Procedure(s) Performed: RIGHT RETROGRADE PYELOGRAM, RIGHT DIAGNOSTIC URETEROSCOPY AND STENT PLACEMENT (Right Ureter)  Patient Location: PACU  Anesthesia Type:General  Level of Consciousness: awake, alert  and oriented  Airway & Oxygen Therapy: Patient Spontanous Breathing and Patient connected to face mask oxygen  Post-op Assessment: Report given to RN and Post -op Vital signs reviewed and stable  Post vital signs: Reviewed and stable  Last Vitals:  Vitals Value Taken Time  BP 137/89 05/21/2018  1:12 PM  Temp    Pulse 74 05/21/2018  1:13 PM  Resp 17 05/21/2018  1:13 PM  SpO2 100 % 05/21/2018  1:13 PM  Vitals shown include unvalidated device data.  Last Pain:  Vitals:   05/21/18 0929  TempSrc: Oral  PainSc: 0-No pain         Complications: No apparent anesthesia complications

## 2018-05-21 NOTE — H&P (View-Only) (Signed)
Renal Mass  HPI: James Massey is a 72 year-old male established patient who is here further eval and management of a renal mass.  The mass is on the right side.   The lesion(s) was first noted on 05/15/2018. The mass was seen on CT Scan.   His symptoms include nausea and blood in urine. Patient denies having flank pain, back pain, groin pain, vomiting, fever, and chills. He has seen blood in his urine. He does have a good appetite. He is not having pain in new locations. He has not recently had unwanted weight loss.   He has not had previous abdominal surgery. The patient can walk a flight of steps.   The patient denies history of diabetes, heart attack or stroke. There is not a a family history of kidney cancer. There is no family history of brain tumors (AMLs), seizures or brain aneurysm's.   The patient's mass was seen as a workup for blood in his urine. He was relatively asymptomatic at the time. It was a noncontrast CT scan. His creatinine was normal at that time, 1.26.   The patient has a history of high blood pressure and gout. He has a past surgical history of bilateral inguinal hernia repair. He is otherwise quite normal with no history of heart attack or diabetes.   Interval: The patient presented today for further evaluation after undergoing a contrast enhanced CT scan with delayed images. He's not had any ongoing flank pain or hematuria. He otherwise feels well.     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  Does not drink caffeine.    REVIEW OF SYSTEMS:    GU Review Male:   Patient denies frequent  urination, hard to postpone urination, burning/ pain with urination, get up at night to urinate, leakage of urine, stream starts and stops, trouble starting your stream, have to strain to urinate , erection problems, and penile pain.  Gastrointestinal (Upper):   Patient reports indigestion/ heartburn. Patient denies nausea and vomiting.  Gastrointestinal (Lower):   Patient denies diarrhea and constipation.  Constitutional:   Patient denies fever, night sweats, weight loss, and fatigue.  Skin:   Patient denies skin rash/ lesion and itching.  Eyes:   Patient denies blurred vision and double vision.  Ears/ Nose/ Throat:   Patient denies sore throat and sinus problems.  Hematologic/Lymphatic:   Patient denies swollen glands and easy bruising.  Cardiovascular:   Patient denies leg swelling and chest pains.  Respiratory:   Patient denies cough and shortness of breath.  Endocrine:   Patient denies excessive thirst.  Musculoskeletal:   Patient denies back pain and joint pain.  Neurological:   Patient denies headaches and dizziness.  Psychologic:   Patient denies depression and anxiety.   VITAL SIGNS:      05/18/2018 12:21 PM  BP 126/85 mmHg  Pulse 86 /min   MULTI-SYSTEM PHYSICAL EXAMINATION:    Constitutional: Well-nourished. No physical deformities. Normally developed. Good grooming.  Neck: Neck symmetrical, not swollen. Normal tracheal position.  Respiratory: Normal breath sounds. No labored breathing, no  use of accessory muscles.   Cardiovascular: Regular rate and rhythm. No murmur, no gallop. Normal temperature, normal extremity pulses, no swelling, no varicosities.   Lymphatic: No enlargement of neck, axillae, groin.  Skin: No paleness, no jaundice, no cyanosis. No lesion, no ulcer, no rash.  Neurologic / Psychiatric: Oriented to time, oriented to place, oriented to person. No depression, no anxiety, no agitation.  Gastrointestinal: No mass, no tenderness, no rigidity, non obese abdomen.   Eyes: Normal conjunctivae. Normal eyelids.  Ears, Nose, Mouth, and Throat: Left ear no scars, no lesions, no masses. Right ear no scars, no lesions, no masses. Nose no scars, no lesions, no masses. Normal hearing. Normal lips.  Musculoskeletal: Normal gait and station of head and neck.     PAST DATA REVIEWED:  Source Of History:  Patient  Records Review:   Pathology Reports, Previous Doctor Records, Previous Patient Records, POC Tool  X-Ray Review: C.T. Abdomen/Pelvis: Reviewed Films. Discussed With Patient.     PROCEDURES:          Urinalysis w/Scope Dipstick Dipstick Cont'd Micro  Color: Yellow Bilirubin: Neg mg/dL WBC/hpf: 0 - 5/hpf  Appearance: Clear Ketones: Neg mg/dL RBC/hpf: 40 - 60/hpf  Specific Gravity: 1.020 Blood: 3+ ery/uL Bacteria: Rare (0-9/hpf)  pH: 5.5 Protein: 1+ mg/dL Cystals: NS (Not Seen)  Glucose: Neg mg/dL Urobilinogen: 0.2 mg/dL Casts: NS (Not Seen)    Nitrites: Neg Trichomonas: Not Present    Leukocyte Esterase: Neg leu/uL Mucous: Present      Epithelial Cells: NS (Not Seen)      Yeast: NS (Not Seen)      Sperm: Not Present    ASSESSMENT:      ICD-10 Details  1 GU:   Benign Neo Kidney, Unspec - D30.00    PLAN:           Document Letter(s):  Created for Patient: Clinical Summary         Notes:   Our plan is to proceed with ureteroscopy to have a good look in the right collecting system to ensure that there are no masses emanating from the urinary tract. This is scheduled for Friday. We will then proceed with nephrectomy or nephroureterectomy on 11/27.

## 2018-05-21 NOTE — H&P (Signed)
Renal Mass  HPI: James Massey is a 72 year-old male established patient who is here further eval and management of a renal mass.  The mass is on the right side.   The lesion(s) was first noted on 05/15/2018. The mass was seen on CT Scan.   His symptoms include nausea and blood in urine. Patient denies having flank pain, back pain, groin pain, vomiting, fever, and chills. He has seen blood in his urine. He does have a good appetite. He is not having pain in new locations. He has not recently had unwanted weight loss.   He has not had previous abdominal surgery. The patient can walk a flight of steps.   The patient denies history of diabetes, heart attack or stroke. There is not a a family history of kidney cancer. There is no family history of brain tumors (AMLs), seizures or brain aneurysm's.   The patient's mass was seen as a workup for blood in his urine. He was relatively asymptomatic at the time. It was a noncontrast CT scan. His creatinine was normal at that time, 1.26.   The patient has a history of high blood pressure and gout. He has a past surgical history of bilateral inguinal hernia repair. He is otherwise quite normal with no history of heart attack or diabetes.   Interval: The patient presented today for further evaluation after undergoing a contrast enhanced CT scan with delayed images. He's not had any ongoing flank pain or hematuria. He otherwise feels well.     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  Does not drink caffeine.    REVIEW OF SYSTEMS:    GU Review Male:   Patient denies frequent  urination, hard to postpone urination, burning/ pain with urination, get up at night to urinate, leakage of urine, stream starts and stops, trouble starting your stream, have to strain to urinate , erection problems, and penile pain.  Gastrointestinal (Upper):   Patient reports indigestion/ heartburn. Patient denies nausea and vomiting.  Gastrointestinal (Lower):   Patient denies diarrhea and constipation.  Constitutional:   Patient denies fever, night sweats, weight loss, and fatigue.  Skin:   Patient denies skin rash/ lesion and itching.  Eyes:   Patient denies blurred vision and double vision.  Ears/ Nose/ Throat:   Patient denies sore throat and sinus problems.  Hematologic/Lymphatic:   Patient denies swollen glands and easy bruising.  Cardiovascular:   Patient denies leg swelling and chest pains.  Respiratory:   Patient denies cough and shortness of breath.  Endocrine:   Patient denies excessive thirst.  Musculoskeletal:   Patient denies back pain and joint pain.  Neurological:   Patient denies headaches and dizziness.  Psychologic:   Patient denies depression and anxiety.   VITAL SIGNS:      05/18/2018 12:21 PM  BP 126/85 mmHg  Pulse 86 /min   MULTI-SYSTEM PHYSICAL EXAMINATION:    Constitutional: Well-nourished. No physical deformities. Normally developed. Good grooming.  Neck: Neck symmetrical, not swollen. Normal tracheal position.  Respiratory: Normal breath sounds. No labored breathing, no  use of accessory muscles.   Cardiovascular: Regular rate and rhythm. No murmur, no gallop. Normal temperature, normal extremity pulses, no swelling, no varicosities.   Lymphatic: No enlargement of neck, axillae, groin.  Skin: No paleness, no jaundice, no cyanosis. No lesion, no ulcer, no rash.  Neurologic / Psychiatric: Oriented to time, oriented to place, oriented to person. No depression, no anxiety, no agitation.  Gastrointestinal: No mass, no tenderness, no rigidity, non obese abdomen.   Eyes: Normal conjunctivae. Normal eyelids.  Ears, Nose, Mouth, and Throat: Left ear no scars, no lesions, no masses. Right ear no scars, no lesions, no masses. Nose no scars, no lesions, no masses. Normal hearing. Normal lips.  Musculoskeletal: Normal gait and station of head and neck.     PAST DATA REVIEWED:  Source Of History:  Patient  Records Review:   Pathology Reports, Previous Doctor Records, Previous Patient Records, POC Tool  X-Ray Review: C.T. Abdomen/Pelvis: Reviewed Films. Discussed With Patient.     PROCEDURES:          Urinalysis w/Scope Dipstick Dipstick Cont'd Micro  Color: Yellow Bilirubin: Neg mg/dL WBC/hpf: 0 - 5/hpf  Appearance: Clear Ketones: Neg mg/dL RBC/hpf: 40 - 60/hpf  Specific Gravity: 1.020 Blood: 3+ ery/uL Bacteria: Rare (0-9/hpf)  pH: 5.5 Protein: 1+ mg/dL Cystals: NS (Not Seen)  Glucose: Neg mg/dL Urobilinogen: 0.2 mg/dL Casts: NS (Not Seen)    Nitrites: Neg Trichomonas: Not Present    Leukocyte Esterase: Neg leu/uL Mucous: Present      Epithelial Cells: NS (Not Seen)      Yeast: NS (Not Seen)      Sperm: Not Present    ASSESSMENT:      ICD-10 Details  1 GU:   Benign Neo Kidney, Unspec - D30.00    PLAN:           Document Letter(s):  Created for Patient: Clinical Summary         Notes:   Our plan is to proceed with ureteroscopy to have a good look in the right collecting system to ensure that there are no masses emanating from the urinary tract. This is scheduled for Friday. We will then proceed with nephrectomy or nephroureterectomy on 11/27.

## 2018-05-21 NOTE — Discharge Instructions (Signed)
DISCHARGE INSTRUCTIONS FOR URETERAL STENT   MEDICATIONS:  1. Resume all your other meds from home - except do not take any extra narcotic pain meds that you may have at home.  2. Pyridium is to help with the burning/stinging when you urinate. 3. Tramadol is for moderate/severe pain, otherwise taking upto 1000 mg every 6 hours of plainTylenol will help treat your pain.     ACTIVITY:  1. No strenuous activity x 1week  2. No driving while on narcotic pain medications  3. Drink plenty of water  4. Continue to walk at home - you can still get blood clots when you are at home, so keep active, but don't over do it.  5. May return to work/school tomorrow or when you feel ready   BATHING:  1. You can shower and we recommend daily showers     SIGNS/SYMPTOMS TO CALL:  Please call us if you have a fever greater than 101.5, uncontrolled nausea/vomiting, uncontrolled pain, dizziness, unable to urinate, bloody urine, chest pain, shortness of breath, leg swelling, leg pain, redness around wound, drainage from wound, or any other concerns or questions.   You can reach [place] at [phone].   FOLLOW-UP:  1. Your follow-up surgery has been scheduled for November 25th

## 2018-05-21 NOTE — Op Note (Signed)
Preoperative diagnosis:  1. Gross Hematuria 2. Right renal mass   Postoperative diagnosis:  1. same   Procedure: 1. cystoscopy, right retrograde pyelogram with interpretation 2. Right diagnostic ureteroscopy 3. Right ureteral balloon dilation 4. Right ureteral stent placed  Surgeon: Ardis Hughs, MD  Anesthesia: General  Complications: None  Intraoperative findings:  #1: The right retrograde pyelogram was performed with a 5 French open-ended ureteral catheter.  10 cc of Omnipaque contrast was instilled in the patient's ureter demonstrating a normal caliber ureter.  There was diminished filling in the upper pole in the posterior calyces.  There is no hydroureteronephrosis. 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 #3: In order to get up into the right kidney with the digital ureteroscope I had to balloon dilate the distal third of the ureter.  EBL: Minimal  Specimens: None  Indication: James Massey is a 72 y.o. patient with A history of gross hematuria who was found to have a large right upper pole renal mass.  The mass will is impinging into the collecting system and there was some concern based on the appearance of the vas that this may well be a transitional cell carcinoma.  After reviewing the management options for treatment, he elected to proceed with the above surgical procedure(s). We have discussed the potential benefits and risks of the procedure, side effects of the proposed treatment, the likelihood of the patient achieving the goals of the procedure, and any potential problems that might occur during the procedure or recuperation. Informed consent has been obtained.  Description of procedure:  The patient was taken to the operating room and general anesthesia was induced.  The patient was placed in the  dorsal lithotomy position, prepped and draped in the usual sterile fashion, and preoperative antibiotics were administered. A preoperative time-out was performed.   The 30 21 French cystoscope was gently passed through the patient's urethra and into the bladder under visual guidance.  Cystoscopy then demonstrated a normal bladder mucosa with no filling defects.  The patient did have a small median lobe of prostate.  The ureters were orthotopic.  There were no abnormalities.  A 5 French open ended ureteral catheter was used to perform a retrograde pyelogram as performed above on the right side.  I then advanced a wire up through the open-ended catheter into the right renal pelvis.  I then used a dual lumen catheter to advance a second wire into the right collecting system.  I then attempted to pass the flexible digital ureteroscope over the wire and into the right collecting 6.  I was unable to advance scope beyond the transmural ureter.as such I backed out the scope and advanced a 10 cm 18 French ureteral balloon dilator.  Under fluoroscopy apposition the balloonacross the transmural ureter and distal aspect of the ureter and inflated it to 13 cm H19for diabetes second.  I then backed out of the balloon and advanced theflexible ureteroscope over the wire and into the right collecting system quite easily.  Pyeloscopy then ensued with the above findings.  There was no evidence of transitional cell carcinoma.  I then slowly backed out the scope inspecting the ureter on the way out noting no significant ureteral trauma.  I then re-passed the 21 French cystoscope scope back loading the wire.  The patient is a 26 cm 6 [place]  double-J stent up into the right ureterin right renal pelvis.  This is all confirmed under fluoroscopy.  With the stent was noted to bein the appropriate position in the right renal pelvis advance the stent to the bladder neck before removing the wire.  I then emptied the patient's bladder  and placed a B&O suppository into the patient's rectum.  The case was subsequently terminated.  The patient was awoken and returned to the PACU in good condition.  Disposition: The patient is scheduled for radical nephrectomy on November 25.  Ardis Hughs, M.D.

## 2018-05-21 NOTE — Anesthesia Procedure Notes (Signed)
Procedure Name: LMA Insertion Date/Time: 05/21/2018 12:17 PM Performed by: Maxwell Caul, CRNA Pre-anesthesia Checklist: Patient identified, Emergency Drugs available, Suction available and Patient being monitored Patient Re-evaluated:Patient Re-evaluated prior to induction Oxygen Delivery Method: Circle system utilized Preoxygenation: Pre-oxygenation with 100% oxygen LMA: LMA inserted LMA Size: 4.0 Number of attempts: 1 Placement Confirmation: positive ETCO2 and breath sounds checked- equal and bilateral Tube secured with: Tape Dental Injury: Teeth and Oropharynx as per pre-operative assessment

## 2018-05-21 NOTE — Anesthesia Postprocedure Evaluation (Signed)
Anesthesia Post Note  Patient: James Massey  Procedure(s) Performed: RIGHT RETROGRADE PYELOGRAM, RIGHT DIAGNOSTIC URETEROSCOPY AND STENT PLACEMENT (Right Ureter)     Patient location during evaluation: PACU Anesthesia Type: General Level of consciousness: awake and alert Pain management: pain level controlled Vital Signs Assessment: post-procedure vital signs reviewed and stable Respiratory status: spontaneous breathing, nonlabored ventilation, respiratory function stable and patient connected to nasal cannula oxygen Cardiovascular status: blood pressure returned to baseline and stable Postop Assessment: no apparent nausea or vomiting Anesthetic complications: no    Last Vitals:  Vitals:   05/21/18 1330 05/21/18 1345  BP: 138/87 (!) 129/93  Pulse: 68 69  Resp: 11 12  Temp:  36.6 C  SpO2: 100% 94%    Last Pain:  Vitals:   05/21/18 1345  TempSrc:   PainSc: 0-No pain                 Barnet Glasgow

## 2018-05-22 ENCOUNTER — Encounter (HOSPITAL_COMMUNITY): Payer: Self-pay | Admitting: Urology

## 2018-05-25 NOTE — Patient Instructions (Addendum)
James Massey  08/27/1945    Your procedure is scheduled on: Monday 05/31/2018   Report to [facility] Main  Entrance,  Report to admitting at   5:30 AM    Call this number if you have problems the morning of surgery [phone]        Remember: Do not eat food or drink liquids :After Midnight.              BRUSH YOUR TEETH MORNING OF SURGERY AND RINSE YOUR MOUTH OUT, NO CHEWING GUM CANDY OR MINTS.       Take these medicines the morning of surgery with A SIP OF WATER:  Pantoprazole (Protonix)                                   You may not have any metal on your body including hair pins and              piercings  Do not wear jewelry, make-up, lotions, powders or perfumes, deodorant                          Men may shave face and neck.   Do not bring valuables to the hospital. [place].  Contacts, dentures or bridgework may not be worn into surgery.  Leave suitcase in the car. After surgery it may be brought to your room.      _____________________________________________________________________             [facility] - Preparing for Surgery Before surgery, you can play an important role.  Because skin is not sterile, your skin needs to be as free of germs as possible.  You can reduce the number of germs on your skin by washing with CHG (chlorahexidine gluconate) soap before surgery.  CHG is an antiseptic cleaner which kills germs and bonds with the skin to continue killing germs even after washing. Please DO NOT use if you have an allergy to CHG or antibacterial soaps.  If your skin becomes reddened/irritated stop using the CHG and inform your nurse when you arrive at Short Stay. Do not shave (including legs and underarms) for at least 48 hours prior to the first CHG shower.  You may shave your face/neck. Please follow these instructions carefully:  1.  Shower with CHG Soap the night  before surgery and the  morning of Surgery.  2.  If you choose to wash your hair, wash your hair first as usual with your  normal  shampoo.  3.  After you shampoo, rinse your hair and body thoroughly to remove the  shampoo.                            4.  Use CHG as you would any other liquid soap.  You can apply chg directly  to the skin and wash                       Gently with a scrungie or clean washcloth.  5.  Apply the CHG Soap to your body ONLY FROM THE NECK DOWN.   Do not use on  face/ open                           Wound or open sores. Avoid contact with eyes, ears mouth and genitals (private parts).                       Wash face,  Genitals (private parts) with your normal soap.             6.  Wash thoroughly, paying special attention to the area where your surgery  will be performed.  7.  Thoroughly rinse your body with warm water from the neck down.  8.  DO NOT shower/wash with your normal soap after using and rinsing off  the CHG Soap.             9.  Pat yourself dry with a clean towel.            10.  Wear clean pajamas.            11.  Place clean sheets on your bed the night of your first shower and do not  sleep with pets. Day of Surgery : Do not apply any lotions/deodorants the morning of surgery.  Please wear clean clothes to the hospital/surgery center.  FAILURE TO FOLLOW THESE INSTRUCTIONS MAY RESULT IN THE CANCELLATION OF YOUR SURGERY PATIENT SIGNATURE_________________________________  NURSE SIGNATURE__________________________________  ________________________________________________________________________

## 2018-05-26 ENCOUNTER — Other Ambulatory Visit (HOSPITAL_COMMUNITY): Payer: Medicare Other

## 2018-05-26 ENCOUNTER — Encounter (HOSPITAL_COMMUNITY)
Admission: RE | Admit: 2018-05-26 | Discharge: 2018-05-26 | Disposition: A | Discharge status: Home / Self Care | Payer: Medicare Other | Source: Ambulatory Visit | Attending: Urology | Admitting: Urology

## 2018-05-26 ENCOUNTER — Encounter (HOSPITAL_COMMUNITY): Payer: Self-pay

## 2018-05-26 ENCOUNTER — Other Ambulatory Visit: Payer: Self-pay

## 2018-05-26 DIAGNOSIS — Z01812 Encounter for preprocedural laboratory examination: Secondary | ICD-10-CM | POA: Diagnosis present

## 2018-05-26 HISTORY — DX: Personal history of diseases of the blood and blood-forming organs and certain disorders involving the immune mechanism: Z86.2

## 2018-05-26 HISTORY — DX: Mixed hyperlipidemia: E78.2

## 2018-05-26 HISTORY — DX: Barrett's esophagus without dysplasia: K22.70

## 2018-05-26 HISTORY — DX: Other specified disorders of kidney and ureter: N28.89

## 2018-05-26 HISTORY — DX: Presence of spectacles and contact lenses: Z97.3

## 2018-05-26 HISTORY — DX: Gastro-esophageal reflux disease without esophagitis: K21.9

## 2018-05-26 LAB — CBC
HCT: 47.8 % (ref 39.0–52.0)
Hemoglobin: 15.9 g/dL (ref 13.0–17.0)
MCH: 31.5 pg (ref 26.0–34.0)
MCHC: 33.3 g/dL (ref 30.0–36.0)
MCV: 94.7 fL (ref 80.0–100.0)
Platelets: 342 10*3/uL (ref 150–400)
RBC: 5.05 MIL/uL (ref 4.22–5.81)
RDW: 12.1 % (ref 11.5–15.5)
WBC: 11.6 10*3/uL — ABNORMAL HIGH (ref 4.0–10.5)
nRBC: 0 % (ref 0.0–0.2)

## 2018-05-26 LAB — COMPREHENSIVE METABOLIC PANEL
ALT: 28 U/L (ref 0–44)
AST: 25 U/L (ref 15–41)
Albumin: 4.1 g/dL (ref 3.5–5.0)
Alkaline Phosphatase: 61 U/L (ref 38–126)
Anion gap: 8 (ref 5–15)
BUN: 19 mg/dL (ref 8–23)
CO2: 30 mmol/L (ref 22–32)
Calcium: 9.2 mg/dL (ref 8.9–10.3)
Chloride: 103 mmol/L (ref 98–111)
Creatinine, Ser: 1.33 mg/dL — ABNORMAL HIGH (ref 0.61–1.24)
GFR calc Af Amer: >60 mL/min (ref >60)
GFR calc non Af Amer: 52 mL/min — ABNORMAL LOW (ref >60)
Glucose, Bld: 83 mg/dL (ref 70–99)
Potassium: 4.4 mmol/L (ref 3.5–5.1)
Sodium: 141 mmol/L (ref 135–145)
Total Bilirubin: 1.1 mg/dL (ref 0.3–1.2)
Total Protein: 7.6 g/dL (ref 6.5–8.1)

## 2018-05-26 LAB — ABO/RH: ABO/RH(D): O POS

## 2018-05-26 NOTE — Progress Notes (Signed)
EKG dated 05-21-2018 in epic.

## 2018-05-27 LAB — URINE CULTURE: Culture: NO GROWTH

## 2018-05-31 ENCOUNTER — Encounter (HOSPITAL_COMMUNITY)
Admission: RE | Disposition: A | Discharge status: Home / Self Care | Payer: Self-pay | Source: Ambulatory Visit | Attending: Urology

## 2018-05-31 ENCOUNTER — Inpatient Hospital Stay (HOSPITAL_COMMUNITY): Payer: Medicare Other | Admitting: Certified Registered"

## 2018-05-31 ENCOUNTER — Other Ambulatory Visit: Payer: Self-pay

## 2018-05-31 ENCOUNTER — Inpatient Hospital Stay (HOSPITAL_COMMUNITY)
Admission: RE | Admit: 2018-05-31 | Discharge: 2018-06-02 | DRG: 658 | Disposition: A | Discharge status: Home / Self Care | Payer: Medicare Other | Source: Ambulatory Visit | Attending: Urology | Admitting: Urology

## 2018-05-31 ENCOUNTER — Encounter (HOSPITAL_COMMUNITY): Payer: Self-pay | Admitting: Emergency Medicine

## 2018-05-31 DIAGNOSIS — Z87891 Personal history of nicotine dependence: Secondary | ICD-10-CM | POA: Diagnosis not present

## 2018-05-31 DIAGNOSIS — Z79899 Other long term (current) drug therapy: Secondary | ICD-10-CM | POA: Diagnosis not present

## 2018-05-31 DIAGNOSIS — Z7982 Long term (current) use of aspirin: Secondary | ICD-10-CM

## 2018-05-31 DIAGNOSIS — I10 Essential (primary) hypertension: Secondary | ICD-10-CM | POA: Diagnosis present

## 2018-05-31 DIAGNOSIS — N2889 Other specified disorders of kidney and ureter: Secondary | ICD-10-CM | POA: Diagnosis present

## 2018-05-31 DIAGNOSIS — C641 Malignant neoplasm of right kidney, except renal pelvis: Secondary | ICD-10-CM | POA: Diagnosis present

## 2018-05-31 HISTORY — PX: LAPAROSCOPIC NEPHRECTOMY, HAND ASSISTED: SHX1929

## 2018-05-31 LAB — TYPE AND SCREEN
ABO/RH(D): O POS
Antibody Screen: NEGATIVE

## 2018-05-31 LAB — CBC
HCT: 43.6 % (ref 39.0–52.0)
Hemoglobin: 14.4 g/dL (ref 13.0–17.0)
MCH: 32 pg (ref 26.0–34.0)
MCHC: 33 g/dL (ref 30.0–36.0)
MCV: 96.9 fL (ref 80.0–100.0)
Platelets: 238 10*3/uL (ref 150–400)
RBC: 4.5 MIL/uL (ref 4.22–5.81)
RDW: 11.9 % (ref 11.5–15.5)
WBC: 20.9 10*3/uL — ABNORMAL HIGH (ref 4.0–10.5)
nRBC: 0 % (ref 0.0–0.2)

## 2018-05-31 LAB — BASIC METABOLIC PANEL
Anion gap: 10 (ref 5–15)
BUN: 18 mg/dL (ref 8–23)
CO2: 26 mmol/L (ref 22–32)
Calcium: 8.5 mg/dL — ABNORMAL LOW (ref 8.9–10.3)
Chloride: 103 mmol/L (ref 98–111)
Creatinine, Ser: 1.56 mg/dL — ABNORMAL HIGH (ref 0.61–1.24)
GFR calc Af Amer: 49 mL/min — ABNORMAL LOW (ref >60)
GFR calc non Af Amer: 43 mL/min — ABNORMAL LOW (ref >60)
Glucose, Bld: 153 mg/dL — ABNORMAL HIGH (ref 70–99)
Potassium: 3.9 mmol/L (ref 3.5–5.1)
Sodium: 139 mmol/L (ref 135–145)

## 2018-05-31 SURGERY — NEPHRECTOMY, HAND-ASSISTED, LAPAROSCOPIC
Anesthesia: General | Laterality: Right

## 2018-05-31 MED ORDER — FENTANYL CITRATE (PF) 100 MCG/2ML IJ SOLN
INTRAMUSCULAR | Status: AC
  Filled 2018-05-31: qty 4

## 2018-05-31 MED ORDER — SUGAMMADEX SODIUM 200 MG/2ML IV SOLN
INTRAVENOUS | Status: AC
  Filled 2018-05-31: qty 2

## 2018-05-31 MED ORDER — CEFAZOLIN SODIUM-DEXTROSE 2-4 GM/100ML-% IV SOLN
2.0000 g | INTRAVENOUS | Status: AC
  Administered 2018-05-31: 2 g via INTRAVENOUS
  Filled 2018-05-31: qty 100

## 2018-05-31 MED ORDER — ONDANSETRON HCL 4 MG/2ML IJ SOLN
INTRAMUSCULAR | Status: AC
  Filled 2018-05-31: qty 2

## 2018-05-31 MED ORDER — PROPOFOL 10 MG/ML IV BOLUS
INTRAVENOUS | Status: DC | PRN
  Administered 2018-05-31: 160 mg via INTRAVENOUS

## 2018-05-31 MED ORDER — LIDOCAINE 2% (20 MG/ML) 5 ML SYRINGE
INTRAMUSCULAR | Status: AC
  Filled 2018-05-31: qty 5

## 2018-05-31 MED ORDER — DEXAMETHASONE SODIUM PHOSPHATE 10 MG/ML IJ SOLN
INTRAMUSCULAR | Status: DC | PRN
  Administered 2018-05-31: 10 mg via INTRAVENOUS

## 2018-05-31 MED ORDER — SODIUM CHLORIDE 0.45 % IV SOLN
INTRAVENOUS | Status: DC
  Administered 2018-05-31 – 2018-06-01 (×2): via INTRAVENOUS

## 2018-05-31 MED ORDER — ALLOPURINOL 300 MG PO TABS
300.0000 mg | ORAL_TABLET | Freq: Every day | ORAL | Status: DC
  Administered 2018-05-31 – 2018-06-01 (×2): 300 mg via ORAL
  Filled 2018-05-31 (×2): qty 1

## 2018-05-31 MED ORDER — ONDANSETRON HCL 4 MG/2ML IJ SOLN: 4.0000 mg | Freq: Four times a day (QID) | INTRAMUSCULAR | Status: DC | PRN

## 2018-05-31 MED ORDER — PROPOFOL 10 MG/ML IV BOLUS
INTRAVENOUS | Status: AC
  Filled 2018-05-31: qty 20

## 2018-05-31 MED ORDER — SODIUM CHLORIDE (PF) 0.9 % IJ SOLN
INTRAMUSCULAR | Status: AC
  Filled 2018-05-31: qty 10

## 2018-05-31 MED ORDER — DEXAMETHASONE SODIUM PHOSPHATE 10 MG/ML IJ SOLN
INTRAMUSCULAR | Status: AC
  Filled 2018-05-31: qty 1

## 2018-05-31 MED ORDER — FENTANYL CITRATE (PF) 100 MCG/2ML IJ SOLN
INTRAMUSCULAR | Status: DC | PRN
  Administered 2018-05-31: 100 ug via INTRAVENOUS
  Administered 2018-05-31 (×3): 50 ug via INTRAVENOUS

## 2018-05-31 MED ORDER — DIAZEPAM 5 MG PO TABS
2.5000 mg | ORAL_TABLET | Freq: Two times a day (BID) | ORAL | Status: DC | PRN
  Administered 2018-05-31: 5 mg via ORAL
  Administered 2018-06-01 – 2018-06-02 (×2): 2.5 mg via ORAL
  Filled 2018-05-31 (×4): qty 1

## 2018-05-31 MED ORDER — MIDAZOLAM HCL 2 MG/2ML IJ SOLN
INTRAMUSCULAR | Status: AC
  Filled 2018-05-31: qty 2

## 2018-05-31 MED ORDER — ZOLPIDEM TARTRATE 5 MG PO TABS
5.0000 mg | ORAL_TABLET | Freq: Every evening | ORAL | Status: DC | PRN
  Administered 2018-05-31 – 2018-06-01 (×2): 5 mg via ORAL
  Filled 2018-05-31 (×2): qty 1

## 2018-05-31 MED ORDER — FENTANYL CITRATE (PF) 250 MCG/5ML IJ SOLN
INTRAMUSCULAR | Status: AC
  Filled 2018-05-31: qty 5

## 2018-05-31 MED ORDER — MORPHINE SULFATE (PF) 2 MG/ML IV SOLN
2.0000 mg | INTRAVENOUS | Status: DC | PRN
  Administered 2018-05-31 – 2018-06-01 (×5): 2 mg via INTRAVENOUS
  Filled 2018-05-31 (×4): qty 1

## 2018-05-31 MED ORDER — EPHEDRINE SULFATE-NACL 50-0.9 MG/10ML-% IV SOSY
PREFILLED_SYRINGE | INTRAVENOUS | Status: DC | PRN
  Administered 2018-05-31: 10 mg via INTRAVENOUS

## 2018-05-31 MED ORDER — ROCURONIUM BROMIDE 10 MG/ML (PF) SYRINGE
PREFILLED_SYRINGE | INTRAVENOUS | Status: DC | PRN
  Administered 2018-05-31: 60 mg via INTRAVENOUS
  Administered 2018-05-31: 10 mg via INTRAVENOUS

## 2018-05-31 MED ORDER — ROCURONIUM BROMIDE 100 MG/10ML IV SOLN
INTRAVENOUS | Status: AC
  Filled 2018-05-31: qty 1

## 2018-05-31 MED ORDER — SUGAMMADEX SODIUM 200 MG/2ML IV SOLN
INTRAVENOUS | Status: DC | PRN
  Administered 2018-05-31: 200 mg via INTRAVENOUS

## 2018-05-31 MED ORDER — TRAMADOL HCL 50 MG PO TABS
50.0000 mg | ORAL_TABLET | Freq: Four times a day (QID) | ORAL | Status: DC | PRN
  Administered 2018-05-31 – 2018-06-02 (×5): 100 mg via ORAL
  Filled 2018-05-31 (×5): qty 2

## 2018-05-31 MED ORDER — BUPIVACAINE-EPINEPHRINE (PF) 0.25% -1:200000 IJ SOLN
INTRAMUSCULAR | Status: DC | PRN
  Administered 2018-05-31: 30 mL

## 2018-05-31 MED ORDER — MIDAZOLAM HCL 5 MG/5ML IJ SOLN
INTRAMUSCULAR | Status: DC | PRN
  Administered 2018-05-31: 2 mg via INTRAVENOUS

## 2018-05-31 MED ORDER — EPHEDRINE 5 MG/ML INJ
INTRAVENOUS | Status: AC
  Filled 2018-05-31: qty 10

## 2018-05-31 MED ORDER — PANTOPRAZOLE SODIUM 40 MG PO TBEC
40.0000 mg | DELAYED_RELEASE_TABLET | Freq: Two times a day (BID) | ORAL | Status: DC
  Administered 2018-05-31 – 2018-06-02 (×4): 40 mg via ORAL
  Filled 2018-05-31 (×4): qty 1

## 2018-05-31 MED ORDER — LACTATED RINGERS IV SOLN
INTRAVENOUS | Status: DC
  Administered 2018-05-31 (×2): via INTRAVENOUS

## 2018-05-31 MED ORDER — ONDANSETRON HCL 4 MG/2ML IJ SOLN
INTRAMUSCULAR | Status: DC | PRN
  Administered 2018-05-31: 4 mg via INTRAVENOUS

## 2018-05-31 MED ORDER — SODIUM CHLORIDE (PF) 0.9 % IJ SOLN
INTRAMUSCULAR | Status: DC | PRN
  Administered 2018-05-31: 57 mL

## 2018-05-31 MED ORDER — ACETAMINOPHEN 10 MG/ML IV SOLN
1000.0000 mg | Freq: Four times a day (QID) | INTRAVENOUS | Status: AC
  Administered 2018-05-31 – 2018-06-01 (×4): 1000 mg via INTRAVENOUS
  Filled 2018-05-31 (×4): qty 100

## 2018-05-31 MED ORDER — METOCLOPRAMIDE HCL 5 MG/ML IJ SOLN: 10.0000 mg | Freq: Once | INTRAMUSCULAR | Status: DC | PRN

## 2018-05-31 MED ORDER — ATORVASTATIN CALCIUM 40 MG PO TABS
40.0000 mg | ORAL_TABLET | Freq: Every day | ORAL | Status: DC
  Administered 2018-05-31 – 2018-06-01 (×2): 40 mg via ORAL
  Filled 2018-05-31 (×2): qty 1

## 2018-05-31 MED ORDER — HYDRALAZINE HCL 20 MG/ML IJ SOLN: 5.0000 mg | INTRAMUSCULAR | Status: DC | PRN

## 2018-05-31 MED ORDER — FENTANYL CITRATE (PF) 100 MCG/2ML IJ SOLN
25.0000 ug | INTRAMUSCULAR | Status: DC | PRN
  Administered 2018-05-31 (×3): 50 ug via INTRAVENOUS

## 2018-05-31 MED ORDER — LIDOCAINE 2% (20 MG/ML) 5 ML SYRINGE
INTRAMUSCULAR | Status: DC | PRN
  Administered 2018-05-31: 100 mg via INTRAVENOUS

## 2018-05-31 MED ORDER — BUPIVACAINE-EPINEPHRINE (PF) 0.25% -1:200000 IJ SOLN
INTRAMUSCULAR | Status: AC
  Filled 2018-05-31: qty 30

## 2018-05-31 MED ORDER — MEPERIDINE HCL 50 MG/ML IJ SOLN: 6.2500 mg | INTRAMUSCULAR | Status: DC | PRN

## 2018-05-31 MED ORDER — SODIUM CHLORIDE (PF) 0.9 % IJ SOLN
INTRAMUSCULAR | Status: AC
  Filled 2018-05-31: qty 50

## 2018-05-31 MED ORDER — BUPIVACAINE LIPOSOME 1.3 % IJ SUSP
20.0000 mL | Freq: Once | INTRAMUSCULAR | Status: AC
  Administered 2018-05-31: 20 mL
  Filled 2018-05-31: qty 20

## 2018-05-31 SURGICAL SUPPLY — 55 items
APPLICATOR ARISTA FLEXITIP XL (MISCELLANEOUS) IMPLANT
APPLICATOR SURGIFLO ENDO (HEMOSTASIS) IMPLANT
APPLIER CLIP ROT 10 11.4 M/L (STAPLE)
BAG LAPAROSCOPIC 12 15 PORT 16 (BASKET) ×1 IMPLANT
BAG RETRIEVAL 12/15 (BASKET) ×2
BAG ZIPLOCK 12X15 (MISCELLANEOUS) IMPLANT
BLADE EXTENDED COATED 6.5IN (ELECTRODE) IMPLANT
BLADE SURG SZ10 CARB STEEL (BLADE) ×2 IMPLANT
CHLORAPREP W/TINT 26ML (MISCELLANEOUS) ×2 IMPLANT
CLIP APPLIE ROT 10 11.4 M/L (STAPLE) IMPLANT
CLIP VESOLOCK LG 6/CT PURPLE (CLIP) ×2 IMPLANT
CLIP VESOLOCK MED LG 6/CT (CLIP) ×4 IMPLANT
CLIP VESOLOCK XL 6/CT (CLIP) ×2 IMPLANT
COVER WAND RF STERILE (DRAPES) ×2 IMPLANT
CUTTER FLEX LINEAR 45M (STAPLE) ×2 IMPLANT
DECANTER SPIKE VIAL GLASS SM (MISCELLANEOUS) ×2 IMPLANT
DERMABOND ADVANCED (GAUZE/BANDAGES/DRESSINGS) ×1
DERMABOND ADVANCED .7 DNX12 (GAUZE/BANDAGES/DRESSINGS) ×1 IMPLANT
DRAPE INCISE IOBAN 66X45 STRL (DRAPES) ×2 IMPLANT
DRAPE WARM FLUID 44X44 (DRAPE) IMPLANT
ELECT PENCIL ROCKER SW 15FT (MISCELLANEOUS) ×2 IMPLANT
ELECT REM PT RETURN 15FT ADLT (MISCELLANEOUS) ×2 IMPLANT
GLOVE BIOGEL M STRL SZ7.5 (GLOVE) ×2 IMPLANT
GOWN STRL REUS W/TWL LRG LVL3 (GOWN DISPOSABLE) ×4 IMPLANT
HEMOSTAT ARISTA ABSORB 3G PWDR (MISCELLANEOUS) IMPLANT
HEMOSTAT SURGICEL 4X8 (HEMOSTASIS) ×2 IMPLANT
HOLDER FOLEY CATH W/STRAP (MISCELLANEOUS) ×2 IMPLANT
IRRIG SUCT STRYKERFLOW 2 WTIP (MISCELLANEOUS) ×2
IRRIGATION SUCT STRKRFLW 2 WTP (MISCELLANEOUS) ×1 IMPLANT
IV LACTATED RINGERS 1000ML (IV SOLUTION) ×2 IMPLANT
KIT BASIN OR (CUSTOM PROCEDURE TRAY) ×2 IMPLANT
MANIFOLD NEPTUNE II (INSTRUMENTS) ×2 IMPLANT
NEEDLE SPNL 22GX3.5 QUINCKE BK (NEEDLE) ×2 IMPLANT
PAD POSITIONING PINK XL (MISCELLANEOUS) ×2 IMPLANT
PROTECTOR NERVE ULNAR (MISCELLANEOUS) ×4 IMPLANT
RELOAD 45 VASCULAR/THIN (ENDOMECHANICALS) ×6 IMPLANT
RELOAD STAPLE TA45 3.5 REG BLU (ENDOMECHANICALS) IMPLANT
SCISSORS LAP 5X35 DISP (ENDOMECHANICALS) IMPLANT
SHEARS HARMONIC ACE PLUS 36CM (ENDOMECHANICALS) ×2 IMPLANT
SLEEVE XCEL OPT CAN 5 100 (ENDOMECHANICALS) ×2 IMPLANT
SPONGE LAP 4X18 RFD (DISPOSABLE) IMPLANT
SUT MNCRL AB 4-0 PS2 18 (SUTURE) ×4 IMPLANT
SUT PDS AB 0 CT1 36 (SUTURE) IMPLANT
SUT VIC AB 2-0 CT1 27 (SUTURE)
SUT VIC AB 2-0 CT1 27XBRD (SUTURE) IMPLANT
SUT VICRYL 0 UR6 27IN ABS (SUTURE) ×2 IMPLANT
TAPE CLOTH 4X10 WHT NS (GAUZE/BANDAGES/DRESSINGS) ×4 IMPLANT
TOWEL OR 17X26 10 PK STRL BLUE (TOWEL DISPOSABLE) ×2 IMPLANT
TOWEL OR NON WOVEN STRL DISP B (DISPOSABLE) ×2 IMPLANT
TRAY FOLEY CATH 16FR SILVER (SET/KITS/TRAYS/PACK) ×2 IMPLANT
TRAY LAPAROSCOPIC (CUSTOM PROCEDURE TRAY) ×2 IMPLANT
TROCAR BLADELESS OPT 5 100 (ENDOMECHANICALS) ×2 IMPLANT
TROCAR UNIVERSAL OPT 12M 100M (ENDOMECHANICALS) ×2 IMPLANT
TROCAR XCEL 12X100 BLDLESS (ENDOMECHANICALS) ×2 IMPLANT
TUBING INSUF HEATED (TUBING) IMPLANT

## 2018-05-31 NOTE — Anesthesia Postprocedure Evaluation (Signed)
Anesthesia Post Note  Patient: James Massey  Procedure(s) Performed: LAPAROSCOPIC RADICAL RIGHT NEPHRECTOMY (Right )     Patient location during evaluation: PACU Anesthesia Type: General Level of consciousness: awake and alert Pain management: pain level controlled Vital Signs Assessment: post-procedure vital signs reviewed and stable Respiratory status: spontaneous breathing, nonlabored ventilation, respiratory function stable and patient connected to nasal cannula oxygen Cardiovascular status: blood pressure returned to baseline and stable Postop Assessment: no apparent nausea or vomiting Anesthetic complications: no    Last Vitals:  Vitals:   05/31/18 1130 05/31/18 1145  BP: 128/77 125/76  Pulse: 86 86  Resp: 16 12  Temp:  37.2 C  SpO2: 98% 99%    Last Pain:  Vitals:   05/31/18 1145  TempSrc:   PainSc: 8                  [name]

## 2018-05-31 NOTE — Anesthesia Preprocedure Evaluation (Addendum)
Anesthesia Evaluation  Patient identified by MRN, date of birth, ID band Patient awake    Reviewed: Allergy & Precautions, NPO status , Patient's Chart, lab work & pertinent test results  Airway Mallampati: II  TM Distance: >3 FB Neck ROM: Full    Dental no notable dental hx. (+) Caps   Pulmonary former smoker,    Pulmonary exam normal breath sounds clear to auscultation       Cardiovascular hypertension, Pt. on medications Normal cardiovascular exam Rhythm:Regular Rate:Normal     Neuro/Psych negative neurological ROS  negative psych ROS   GI/Hepatic negative GI ROS, Neg liver ROS,   Endo/Other  negative endocrine ROS  Renal/GU negative Renal ROS  negative genitourinary   Musculoskeletal negative musculoskeletal ROS (+)   Abdominal   Peds negative pediatric ROS (+)  Hematology negative hematology ROS (+)   Anesthesia Other Findings   Reproductive/Obstetrics negative OB ROS                             Anesthesia Physical Anesthesia Plan  ASA: II  Anesthesia Plan: General   Post-op Pain Management:    Induction: Intravenous  PONV Risk Score and Plan: 2 and Ondansetron and Treatment may vary due to age or medical condition  Airway Management Planned: Oral ETT  Additional Equipment:   Intra-op Plan:   Post-operative Plan: Extubation in OR  Informed Consent: I have reviewed the patients History and Physical, chart, labs and discussed the procedure including the risks, benefits and alternatives for the proposed anesthesia with the patient or authorized representative who has indicated his/her understanding and acceptance.   Dental advisory given  Plan Discussed with: CRNA  Anesthesia Plan Comments:         Anesthesia Quick Evaluation

## 2018-05-31 NOTE — Progress Notes (Signed)
Pt refused CPAP for QHS. Pt states he does not ware one at home.

## 2018-05-31 NOTE — Anesthesia Procedure Notes (Signed)
Procedure Name: Intubation Date/Time: 05/31/2018 7:49 AM Performed by: Tanda Morrissey D, CRNA Pre-anesthesia Checklist: Patient identified, Emergency Drugs available, Suction available and Patient being monitored Patient Re-evaluated:Patient Re-evaluated prior to induction Oxygen Delivery Method: Circle system utilized Preoxygenation: Pre-oxygenation with 100% oxygen Induction Type: IV induction Ventilation: Mask ventilation without difficulty Laryngoscope Size: Mac and 4 Grade View: Grade III Tube type: Oral Tube size: 7.5 mm Number of attempts: 1 Airway Equipment and Method: Stylet Placement Confirmation: ETT inserted through vocal cords under direct vision,  positive ETCO2 and breath sounds checked- equal and bilateral Secured at: 22 cm Tube secured with: Tape Dental Injury: Teeth and Oropharynx as per pre-operative assessment

## 2018-05-31 NOTE — Interval H&P Note (Signed)
History and Physical Interval Note: URS demonstrated no tumors within the right collecting system, suggesting that his mass is growing from the renal parenchyma.  He presents today for right radical nephrectomy. 05/31/2018 5:58 AM  James Massey  has presented today for surgery, with the diagnosis of RIGHT RENAL MASS  The various methods of treatment have been discussed with the patient and family. After consideration of risks, benefits and other options for treatment, the patient has consented to  Procedure(s): HAND ASSISTED LAPAROSCOPIC NEPHRECTOMY (Right) as a surgical intervention .  The patient's history has been reviewed, patient examined, no change in status, stable for surgery.  I have reviewed the patient's chart and labs.  Questions were answered to the patient's satisfaction.     [name] W

## 2018-05-31 NOTE — Op Note (Signed)
Preoperative diagnosis:  1. Right renal mass   Postoperative diagnosis:  1. same   Procedure: 1. Laparoscopic right radical nephrectomy  Surgeon: Ardis Hughs, MD First Assistant: none  Anesthesia: General  Complications: None  Intraoperative findings: Non-adrenal sparing nephrectomy.  Artery and vein was stapled separately.  Stent was removed at the time of ureter ligation.  EBL: 50 cc  Specimens:  Right kidney and proximal ureter  Indication: James Massey is a 72 y.o. patient with history of gross hematuria was found to have a large renal mass.  The mass was adjacent to the collecting system and he underwent ureteroscopy to confirm that this was not a transitional cell carcinoma.  Given that he had a normal ureteroscopy, we opted to proceed with a radical nephrectomy.  After reviewing the management options for treatment, he elected to proceed with the above surgical procedure(s). We have discussed the potential benefits and risks of the procedure, side effects of the proposed treatment, the likelihood of the patient achieving the goals of the procedure, and any potential problems that might occur during the procedure or recuperation. Informed consent has been obtained.  Description of procedure:  A site was selected lateral to the umbilicus for placement of the camera port. This was placed using a standard open Hassan technique which allowed entry into the peritoneal cavity under direct vision and without difficulty. A 12 mm Hassan cannula was placed and a pneumoperitoneum established. The camera was then used to inspect the abdomen and there was no evidence of any intra-abdominal injuries or other abnormalities. The remaining abdominal ports were then placed under visual guidance.  A second 12 mm port was placed in the right lower quadrant approximately 8 cm away from the camera. A 5 mm port was placed in the right upper quadrant again 8 cm away from the camera. A second 5 mm  port was placed just inferior to the xiphoid process in the midline, this was used as a liver retractor.  An additional 5 mm port was then placed in the right lower quadrant at the anterior axillary line lateral to the 12 mm port. All ports were placed under direct vision without difficulty.   The white line of Toldt was incised allowing the colon to be mobilized medially and the plane between the mesocolon and the anterior layer of Gerota's fascia to be developed and the kidney exposed. The ureter and gonadal vein were identified inferiorly and the ureter was lifted anteriorly off the psoas muscle. Dissection proceeded superiorly along the gonadal vein until the renal vein was identified. The renal hilum was then carefully isolated with a combination of blunt and sharp dissectiong allowing the renal arterial and venous structures to be separated and isolated.   The renal artery was isolated and ligated and divided using a 45 mm Flex ETS stapler.   The renal vein was then isolated and also ligated and divided separately with a 45 mm Flex ETS stapler.   The dissection then moved forward around the upper pole taking the adrenal gland as part of the specimen.  The hepatorenal ligaments were divided.. The lateral and posterior attachements to the kidney were then divided.  The ureter was then divided in the distal component of the ureteral stent that had been placed at this previous surgery was removed from the patient.  The distal ureteral ligated and was then clipped with a large clip.  The proximal ureteral and was also clipped to prevent the stent from migrating out.  Once the kidney was free from its attachments  40cc of 0.25% rupivocaine was then injected into the right anterior axillary line b/w the iliac crest and the twelfth rib under laparoscopic guidance. The layer between the tranversus abdominus and the internal oblique was targeted.   The kidney/ureter specimen was then placed into a 12 mm  Endocatch II retrieval bag. The renal hilum, liver, adrenal bed and gonadal vein areas were each inspected and hemostasis was ensured with the pneomperitoneal pressures lowered. Surgicel was then placed over the hilum.  The camera was then brought to the 12 mm port laterally and the camera port was removed and the fascia closed using a [place] needle with a 0 Vicryl.  All the ports were then removed under visual guidance. The lateral 12 mm port and 5 mm port were then connected sharply with a 15 blade. Then opened this incision down to the external oblique fascia. We then spread the muscle fibers down to the internal oblique fascia which we then opened with cautery. These were then spread in all muscle spared and the posterior peritoneum was opened. The rectus muscle was pulled medially. The specimen was then removed through these incision. The internal oblique fascia was then closed with a 0 Vicryl in a running fashion. The external oblique fascia was then closed with a 0 PDS in a running fashion.  All incisions were injected with local anesthetic and reapproximated at the skin with 4-0 monocryl sutures. Dermabond was applied to the skin. The patient tolerated the procedure well and without complications and was transferred to the recovery unit in satisfactory condition.   Ardis Hughs, M.D.

## 2018-05-31 NOTE — Transfer of Care (Signed)
Immediate Anesthesia Transfer of Care Note  Patient: James Massey  Procedure(s) Performed: LAPAROSCOPIC RADICAL RIGHT NEPHRECTOMY (Right )  Patient Location: PACU  Anesthesia Type:General  Level of Consciousness: awake, alert  and oriented  Airway & Oxygen Therapy: Patient Spontanous Breathing and Patient connected to face mask oxygen  Post-op Assessment: Report given to RN and Post -op Vital signs reviewed and stable  Post vital signs: Reviewed and stable  Last Vitals:  Vitals Value Taken Time  BP 128/97 05/31/2018 10:49 AM  Temp 37 C 05/31/2018 10:49 AM  Pulse 100 05/31/2018 10:52 AM  Resp 12 05/31/2018 10:51 AM  SpO2 100 % 05/31/2018 10:52 AM  Vitals shown include unvalidated device data.  Last Pain:  Vitals:   05/31/18 3094  TempSrc: Oral  PainSc: 0-No pain      Patients Stated Pain Goal: 4 (07/68/08 8110)  Complications: No apparent anesthesia complications

## 2018-05-31 NOTE — Discharge Instructions (Signed)

## 2018-06-01 ENCOUNTER — Encounter (HOSPITAL_COMMUNITY): Payer: Self-pay | Admitting: Urology

## 2018-06-01 LAB — BASIC METABOLIC PANEL
Anion gap: 8 (ref 5–15)
BUN: 19 mg/dL (ref 8–23)
CO2: 27 mmol/L (ref 22–32)
Calcium: 8.5 mg/dL — ABNORMAL LOW (ref 8.9–10.3)
Chloride: 99 mmol/L (ref 98–111)
Creatinine, Ser: 1.66 mg/dL — ABNORMAL HIGH (ref 0.61–1.24)
GFR calc Af Amer: 46 mL/min — ABNORMAL LOW (ref >60)
GFR calc non Af Amer: 40 mL/min — ABNORMAL LOW (ref >60)
Glucose, Bld: 115 mg/dL — ABNORMAL HIGH (ref 70–99)
Potassium: 4.6 mmol/L (ref 3.5–5.1)
Sodium: 134 mmol/L — ABNORMAL LOW (ref 135–145)

## 2018-06-01 LAB — CBC
HCT: 39.7 % (ref 39.0–52.0)
Hemoglobin: 13.2 g/dL (ref 13.0–17.0)
MCH: 31.7 pg (ref 26.0–34.0)
MCHC: 33.2 g/dL (ref 30.0–36.0)
MCV: 95.2 fL (ref 80.0–100.0)
Platelets: 313 10*3/uL (ref 150–400)
RBC: 4.17 MIL/uL — ABNORMAL LOW (ref 4.22–5.81)
RDW: 11.9 % (ref 11.5–15.5)
WBC: 18.7 10*3/uL — ABNORMAL HIGH (ref 4.0–10.5)
nRBC: 0 % (ref 0.0–0.2)

## 2018-06-01 MED ORDER — BISACODYL 10 MG RE SUPP
10.0000 mg | Freq: Once | RECTAL | Status: AC
  Administered 2018-06-01: 10 mg via RECTAL
  Filled 2018-06-01: qty 1

## 2018-06-01 MED ORDER — DOCUSATE SODIUM 100 MG PO CAPS: 100.0000 mg | ORAL_CAPSULE | Freq: Two times a day (BID) | ORAL | 2 refills | Status: AC

## 2018-06-01 MED ORDER — TRAMADOL HCL 50 MG PO TABS: 50.0000 mg | ORAL_TABLET | Freq: Four times a day (QID) | ORAL | 0 refills | Status: DC | PRN

## 2018-06-01 NOTE — Progress Notes (Signed)
Urology Inpatient Progress Report  RIGHT RENAL MASS  Procedure(s): LAPAROSCOPIC RADICAL RIGHT NEPHRECTOMY  1 Day Post-Op   Intv/Subj: No acute events overnight. Patient is without complaint. Hasn't been up and walked yet Catheter has been removed No nausea - on clear liquids  Active Problems:   Renal mass, left  Current Facility-Administered Medications  Medication Dose Route Frequency Provider Last Rate Last Dose  . acetaminophen (OFIRMEV) IV 1,000 mg  1,000 mg Intravenous Q6H Ardis Hughs, MD 400 mL/hr at 06/01/18 0500 1,000 mg at 06/01/18 0500  . allopurinol (ZYLOPRIM) tablet 300 mg  300 mg Oral QHS Ardis Hughs, MD   300 mg at 05/31/18 2118  . atorvastatin (LIPITOR) tablet 40 mg  40 mg Oral QHS Ardis Hughs, MD   40 mg at 05/31/18 2118  . diazepam (VALIUM) tablet 2.5-5 mg  2.5-5 mg Oral Q12H PRN Ardis Hughs, MD   2.5 mg at 06/01/18 0457  . hydrALAZINE (APRESOLINE) injection 5 mg  5 mg Intravenous Q4H PRN Ardis Hughs, MD      . morphine 2 MG/ML injection 2 mg  2 mg Intravenous Q1H PRN Ardis Hughs, MD   2 mg at 05/31/18 1822  . ondansetron (ZOFRAN) injection 4 mg  4 mg Intravenous Q6H PRN Ardis Hughs, MD      . pantoprazole (PROTONIX) EC tablet 40 mg  40 mg Oral BID Ardis Hughs, MD   40 mg at 05/31/18 2118  . traMADol (ULTRAM) tablet 50-100 mg  50-100 mg Oral Q6H PRN Ardis Hughs, MD   100 mg at 06/01/18 0457  . zolpidem (AMBIEN) tablet 5 mg  5 mg Oral QHS PRN Ardis Hughs, MD   5 mg at 05/31/18 2141     Objective: Vital: Vitals:   05/31/18 1815 05/31/18 2035 06/01/18 0027 06/01/18 0453  BP: (!) 145/99 127/74 119/84 126/78  Pulse: 88 84 88 84  Resp: 16 18 18 18   Temp: 99.6 F (37.6 C) 98 F (36.7 C) 98.5 F (36.9 C) 97.9 F (36.6 C)  TempSrc: Oral Oral Oral Oral  SpO2: 94% 96% 93% 97%  Weight:      Height:       I/Os: I/O last 3 completed shifts: In: 4136.7 [P.O.:720; I.V.:3121.7; IV  Piggyback:295] Out: 1875 [Urine:1850; Blood:25]  Physical Exam:  General: Patient is in no apparent distress Lungs: Normal respiratory effort, chest expands symmetrically. GI: Incisions are c/d/i. Some bruising around the extraction incision in right lower quadrant. Foley: out  Ext: lower extremities symmetric  Lab Results: Recent Labs    05/31/18 1359 06/01/18 0525  WBC 20.9* 18.7*  HGB 14.4 13.2  HCT 43.6 39.7   Recent Labs    05/31/18 1359 06/01/18 0525  NA 139 134*  K 3.9 4.6  CL 103 99  CO2 26 27  GLUCOSE 153* 115*  BUN 18 19  CREATININE 1.56* 1.66*  CALCIUM 8.5* 8.5*   No results for input(s): LABPT, INR in the last 72 hours. No results for input(s): LABURIN in the last 72 hours. Results for orders placed or performed during the hospital encounter of 05/26/18  Urine culture     Status: None   Collection Time: 05/26/18 10:42 AM  Result Value Ref Range Status   Specimen Description   Final    URINE, CLEAN CATCH Performed at [facility], [place] [address], [place], [place] [address]    Special Requests   Final    NONE  Performed at [facility], [place] [address] [place], [place] [address]    Culture   Final    NO GROWTH Performed at [facility], [place] [address] [place], [place] [address]    Report Status 05/27/2018 FINAL  Final    Studies/Results: No results found.  Assessment: Procedure(s): LAPAROSCOPIC RADICAL RIGHT NEPHRECTOMY, 1 Day Post-Op  doing well. Moving slowing.  Needs to get up out of bed, walk.  Will d/c fluids and normalize him, will re-evaluate this PM for discharge.  Plan: HLIVF TRansition to oral pain medications ADAT Encourage ambulation REstart home medications.  Possibly home this afternoon or tomorrow AM.   [name], MD Urology 06/01/2018, 7:45 AM

## 2018-06-01 NOTE — Plan of Care (Signed)
  Problem: Nutrition: Goal: Adequate nutrition will be maintained Outcome: Progressing   Problem: Pain Managment: Goal: General experience of comfort will improve Outcome: Progressing   Problem: Safety: Goal: Ability to remain free from injury will improve Outcome: Progressing   

## 2018-06-02 NOTE — Progress Notes (Signed)
Vs 132/99 HR 111 temp 100F. I reported Vitals to on coming nurse. On coming nurse will pass on vitals to surgeon . Will continue to monitor.

## 2018-06-02 NOTE — Discharge Summary (Signed)
Date of admission: 05/31/2018  Date of discharge: 06/02/2018  Admission diagnosis: right renal mass  Discharge diagnosis: same  Secondary diagnoses:  Patient Active Problem List   Diagnosis Date Noted  . Renal mass, left 05/31/2018    Procedures performed: Procedure(s): LAPAROSCOPIC RADICAL RIGHT NEPHRECTOMY  History and Physical: For full details, please see admission history and physical. Briefly, James Massey is a 72 y.o. year old patient with right renal mass.   Vitals:   06/01/18 0945 06/01/18 1504 06/01/18 2143 06/02/18 0610  BP: (!) 143/89 (!) 146/91 (!) 135/98 (!) 132/99  Pulse: 79 78 91 (!) 111  Resp: _0 Temp: 98.2 F (36.8 C) 98 F (36.7 C) 98 F (36.7 C) 100 F (37.8 C)  TempSrc: Oral Oral Oral Oral  SpO2: 96% 96% 95% 95%  Weight:      Height:        Intake/Output Summary (Last 24 hours) at 06/02/2018 1014 Last data filed at 06/02/2018 0545 Gross per 24 hour  Intake 480 ml  Output 2300 ml  Net -1820 ml   NAD Non-labored breathing Abdomen is soft Ext symmetric   Hospital Course: Patient tolerated the procedure well.  He was then transferred to the floor after an uneventful PACU stay.  His hospital course was uncomplicated.  On POD#2 he had met discharge criteria: was eating a regular diet, was up and ambulating independently,  pain was well controlled, was voiding without a catheter, and was ready to for discharge.   Laboratory values:  Recent Labs    05/31/18 1359 06/01/18 0525  WBC 20.9* 18.7*  HGB 14.4 13.2  HCT 43.6 39.7   Recent Labs    05/31/18 1359 06/01/18 0525  NA 139 134*  K 3.9 4.6  CL 103 99  CO2 26 27  GLUCOSE 153* 115*  BUN 18 19  CREATININE 1.56* 1.66*  CALCIUM 8.5* 8.5*   No results for input(s): LABPT, INR in the last 72 hours. No results for input(s): LABURIN in the last 72 hours. Results for orders placed or performed during the hospital encounter of 05/26/18  Urine culture     Status: None    Collection Time: 05/26/18 10:42 AM  Result Value Ref Range Status   Specimen Description   Final    URINE, CLEAN CATCH Performed at [facility], [place] [address], [place], [place] [address]    Special Requests   Final    NONE Performed at [facility], [place] [address] [place], [place] [address]    Culture   Final    NO GROWTH Performed at [facility], [place] [address], [place], [place] [address]    Report Status 05/27/2018 FINAL  Final    Disposition: Home  Discharge instruction: The patient was instructed to be ambulatory but told to refrain from heavy lifting, strenuous activity, or driving.   Discharge medications:  Allergies as of 06/02/2018      Reactions   Bee Venom Swelling   Poison [employer] and ivy   Clindamycin/lincomycin Rash      Medication List    STOP taking these medications   indomethacin 50 MG capsule Commonly known as:  INDOCIN   phenazopyridine 200 MG tablet Commonly known as:  PYRIDIUM     TAKE these medications   allopurinol 300 MG tablet Commonly known as:  ZYLOPRIM Take 300 mg by mouth at bedtime.   aspirin EC 81 MG  tablet Take 81 mg by mouth at bedtime.   atorvastatin 40 MG tablet Commonly known as:  LIPITOR Take 40 mg by mouth at bedtime.   diazepam 5 MG tablet Commonly known as:  VALIUM Take 2.5-5 mg by mouth every 12 (twelve) hours as needed (for eye twitching).   docusate sodium 100 MG capsule Commonly known as:  COLACE Take 1 capsule (100 mg total) by mouth 2 (two) times daily.   hydrocortisone 2.5 % cream Apply 1 application topically 2 (two) times daily as needed (for hemorroidal flare ups).   pantoprazole 40 MG tablet Commonly known as:  PROTONIX Take 40 mg by mouth 2 (two) times daily.   pseudoephedrine-acetaminophen 30-500 MG Tabs tablet Commonly known as:  TYLENOL SINUS Take 1-2 tablets by mouth every 4 (four) hours as needed (for sinus issues).    Simethicone 180 MG Caps Take 180 mg by mouth 3 (three) times daily as needed (for gas/indigestion.).   traMADol 50 MG tablet Commonly known as:  ULTRAM Take 1-2 tablets (50-100 mg total) by mouth every 6 (six) hours as needed for moderate pain.       Followup:  Follow-up Information    Ardis Hughs, MD On 06/17/2018.   Specialty:  Urology Why:  [place] information: [place] [id number] [phone]

## 2018-06-02 NOTE — Progress Notes (Signed)
Pt given discharge instructions with understanding pt has no questions at this time. Wife at bedside to take pt home.

## 2018-08-13 DIAGNOSIS — Z905 Acquired absence of kidney: Secondary | ICD-10-CM | POA: Insufficient documentation

## 2019-06-19 ENCOUNTER — Other Ambulatory Visit: Payer: Self-pay

## 2019-06-19 ENCOUNTER — Emergency Department (HOSPITAL_BASED_OUTPATIENT_CLINIC_OR_DEPARTMENT_OTHER): Payer: Medicare Other

## 2019-06-19 ENCOUNTER — Emergency Department (HOSPITAL_BASED_OUTPATIENT_CLINIC_OR_DEPARTMENT_OTHER)
Admission: EM | Admit: 2019-06-19 | Discharge: 2019-06-19 | Disposition: A | Discharge status: Home / Self Care | Payer: Medicare Other | Attending: Emergency Medicine | Admitting: Emergency Medicine

## 2019-06-19 ENCOUNTER — Encounter (HOSPITAL_BASED_OUTPATIENT_CLINIC_OR_DEPARTMENT_OTHER): Payer: Self-pay | Admitting: Emergency Medicine

## 2019-06-19 DIAGNOSIS — M25551 Pain in right hip: Secondary | ICD-10-CM | POA: Diagnosis not present

## 2019-06-19 DIAGNOSIS — Z79899 Other long term (current) drug therapy: Secondary | ICD-10-CM | POA: Diagnosis not present

## 2019-06-19 DIAGNOSIS — Z85528 Personal history of other malignant neoplasm of kidney: Secondary | ICD-10-CM | POA: Diagnosis not present

## 2019-06-19 DIAGNOSIS — Z87891 Personal history of nicotine dependence: Secondary | ICD-10-CM | POA: Diagnosis not present

## 2019-06-19 HISTORY — DX: Malignant neoplasm of unspecified kidney, except renal pelvis: C64.9

## 2019-06-19 IMAGING — DX DG HIP (WITH OR WITHOUT PELVIS) 2-3V*R*
3 series · 3 of 3 positions shown · non-contrast
Comparison: CT scan of the abdomen and pelvis dated [DATE]

CLINICAL DATA: Right hip pain for 2.5 weeks.

EXAM:
DG HIP (WITH OR WITHOUT PELVIS) 2-3V RIGHT

[pelvis ap]
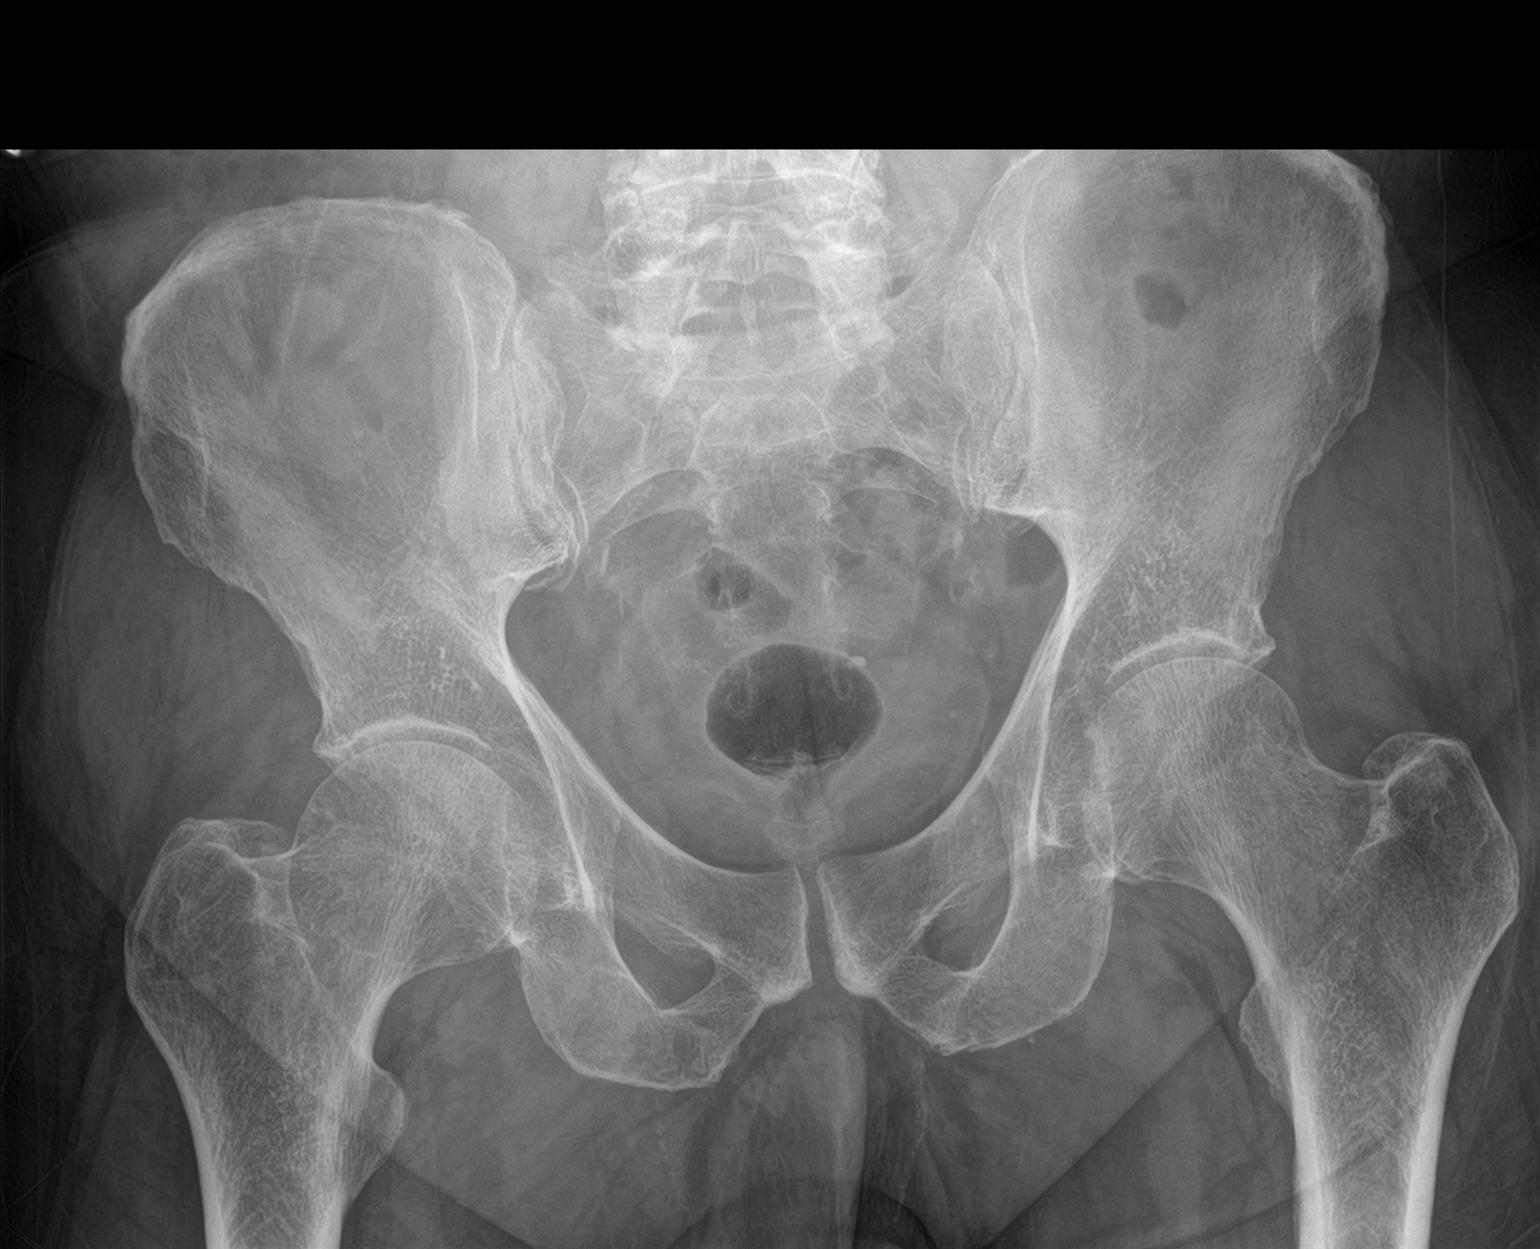

[hip ap]
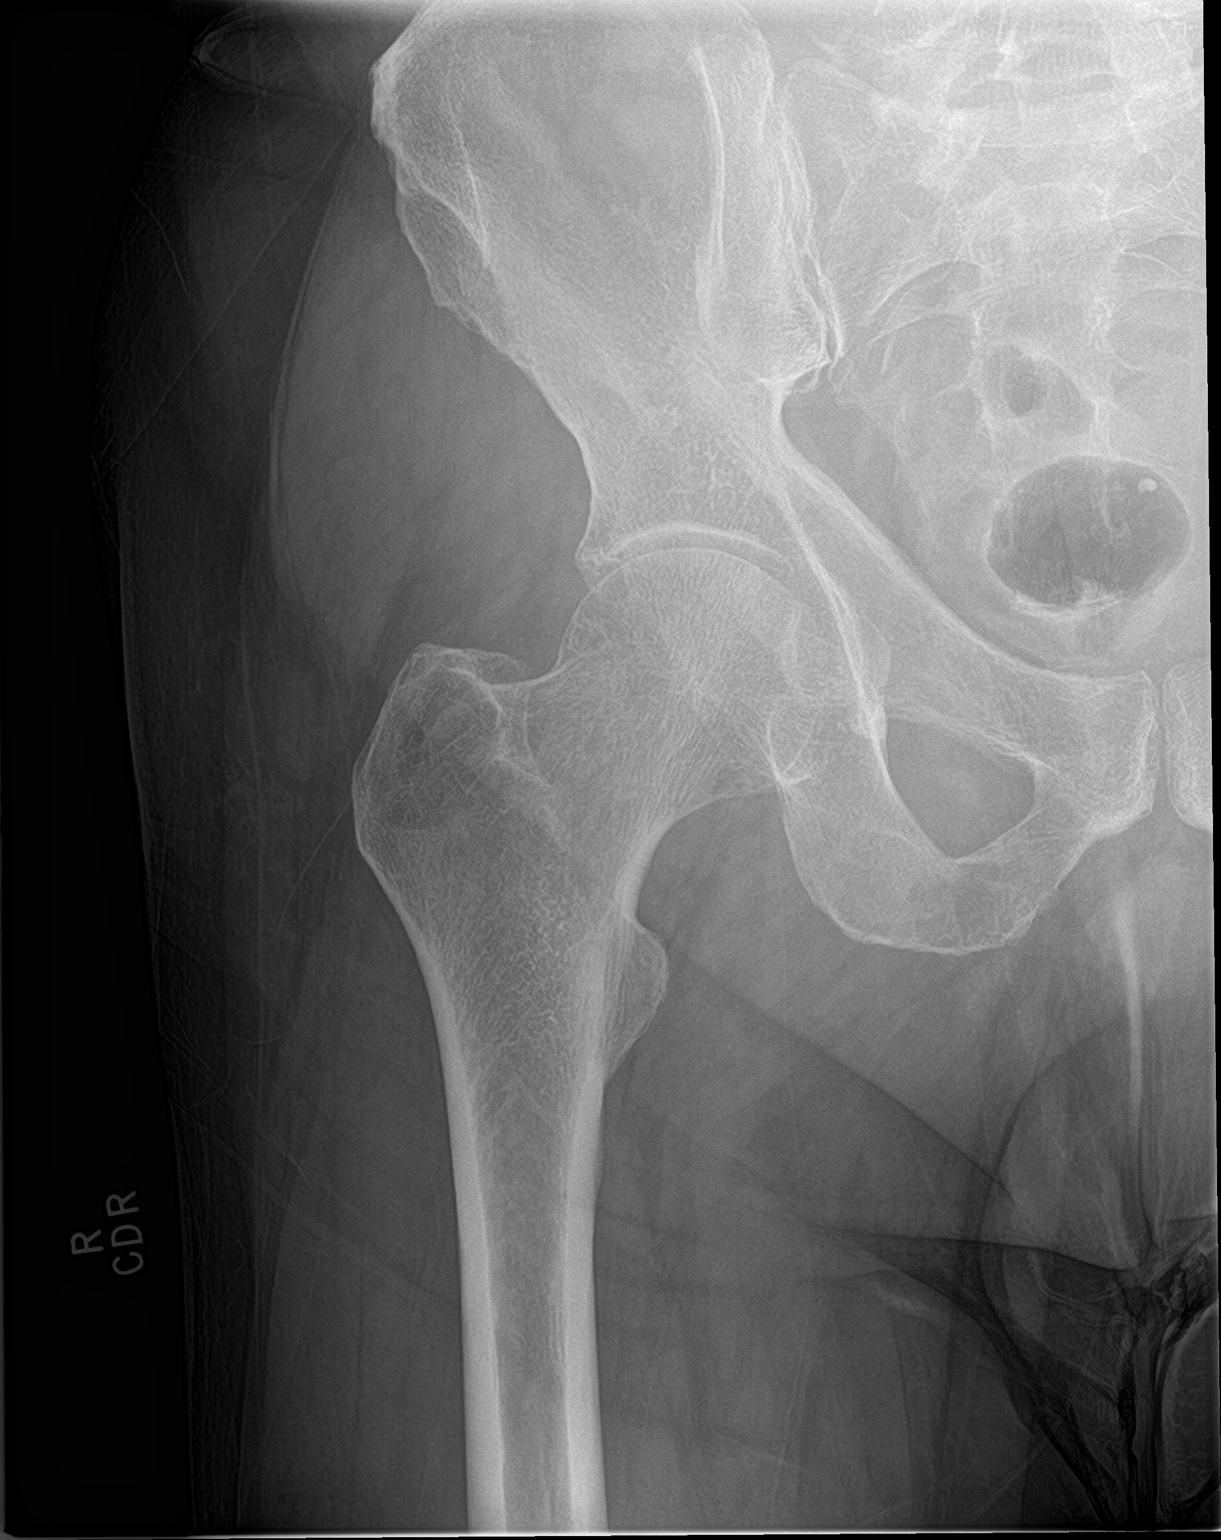

[hip lat]
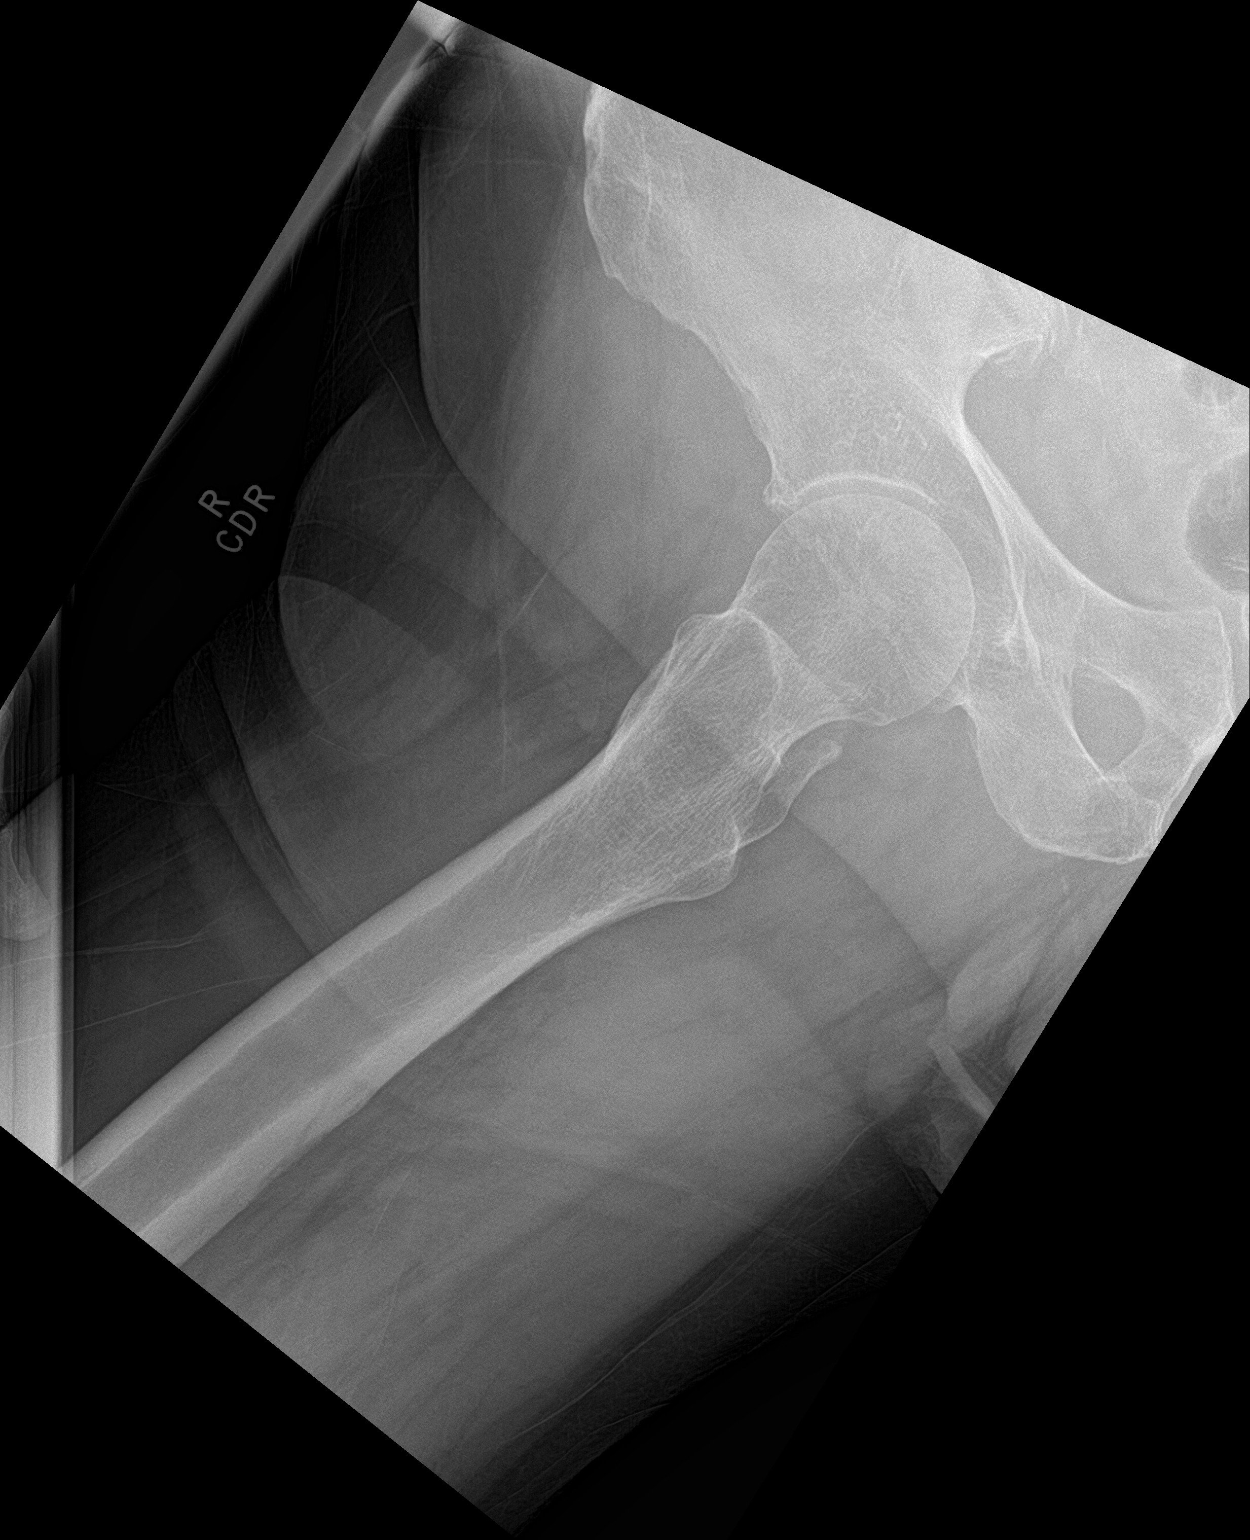

[3 of 3 positions shown; findings below may reference images not displayed]

FINDINGS: There is no evidence of hip fracture or dislocation. There are
minimal cystic degenerative changes of the superolateral aspect of
the right acetabulum. There are similar changes in the left hip.
IMPRESSION: No acute abnormalities. Minimal degenerative changes of the right
hip.

## 2019-06-19 MED ORDER — TRAMADOL HCL 50 MG PO TABS: 50.0000 mg | ORAL_TABLET | Freq: Three times a day (TID) | ORAL | 0 refills | Status: DC | PRN

## 2019-06-19 NOTE — Discharge Instructions (Addendum)
It was our pleasure to provide your ER care today - we hope that you feel better.  Take acetaminophen as need for pain. You may also try ultram as need for pain - no driving when taking.   Follow up with orthopedist or primary care doctor in the next 1-2 weeks.  Return to ER if worse, new symptoms, fevers, severe or intractable pain, numbness/weakness, swelling/redness, or other concern.

## 2019-06-19 NOTE — ED Triage Notes (Signed)
Pt reports left hip pain x 2 weeks. Pt also reports pain to B/L knees. Pt reports that he slammed his left hip into a door to get it to close.

## 2019-06-19 NOTE — ED Provider Notes (Signed)
[place] EMERGENCY DEPARTMENT Provider Note   CSN: [id number] Arrival date & time: 06/19/19  1347     History Chief Complaint  Patient presents with  . Hip Pain    James Massey is a 73 y.o. male.  Patient c/o right hip pain in the past 2 weeks. States may have slammed it into a door then, but no other trauma or fall. Denies prior hx hip pain. Pain constant, dull, moderate, worse w certain movements and weight bearing and walking. No back pain. Pain occasional radiates down right leg, but no consistent radicular pain. No leg numbness or weakness. No saddle area numbness. No problems w normal bowel or bladder function. Denies any skin changes, redness, or swelling. No fever or chills. States was placed on a prednisone taper without significant improvement in symptoms - c/o that no xrays were done then or yet.   The history is provided by the patient and a relative.  Hip Pain Pertinent negatives include no chest pain, no abdominal pain and no shortness of breath.       Past Medical History:  Diagnosis Date  . Anxiety   . Barrett esophagus   . GERD (gastroesophageal reflux disease)   . Gout   . Heart murmur   . History of anemia   . Kidney cancer, primary, 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 to other site [facility])   . Mixed hyperlipidemia   . Right renal mass   . Wears glasses     Patient Active Problem List   Diagnosis Date Noted  . Renal mass, left 05/31/2018    Past Surgical History:  Procedure Laterality Date  . CYSTOSCOPY WITH RETROGRADE PYELOGRAM, URETEROSCOPY AND STENT PLACEMENT Right 05/21/2018   Procedure: RIGHT RETROGRADE PYELOGRAM, RIGHT DIAGNOSTIC URETEROSCOPY AND STENT PLACEMENT;  Surgeon: Ardis Hughs, MD;  Location: [facility] ORS;  Service: Urology;  Laterality: Right;  . INGUINAL HERNIA REPAIR Bilateral 09/2012  . LAPAROSCOPIC NEPHRECTOMY, HAND ASSISTED Right 05/31/2018   Procedure: LAPAROSCOPIC RADICAL RIGHT NEPHRECTOMY;  Surgeon: Ardis Hughs, MD;  Location: [facility] ORS;  Service: Urology;  Laterality: Right;       No family history on file.  Social History   Tobacco Use  . Smoking status: Former Smoker    Years: 15.00    Types: Cigarettes    Quit date: 12/12/1987    Years since quitting: 31.5  . Smokeless tobacco: Never Used  Substance Use Topics  . Alcohol use: Yes    Comment: occ beer   . Drug use: Never    Home Medications Prior to Admission medications   Medication Sig Start Date End Date Taking? Authorizing Provider  allopurinol (ZYLOPRIM) 300 MG tablet Take 300 mg by mouth at bedtime.     [provider]  aspirin EC 81 MG tablet Take 81 mg by mouth at bedtime.     [provider]  atorvastatin (LIPITOR) 40 MG tablet Take 40 mg by mouth at bedtime.     [provider]  diazepam (VALIUM) 5 MG tablet Take 2.5-5 mg by mouth every 12 (twelve) hours as needed (for eye twitching).     [provider]  hydrocortisone 2.5 % cream Apply 1 application topically 2 (two) times daily as needed (for hemorroidal flare ups).    [provider]  pantoprazole (PROTONIX) 40 MG tablet Take 40 mg by mouth 2 (two) times daily.    [provider]  pseudoephedrine-acetaminophen (TYLENOL SINUS) 30-500 MG TABS tablet Take 1-2 tablets by  mouth every 4 (four) hours as needed (for sinus issues).    [provider]  Simethicone 180 MG CAPS Take 180 mg by mouth 3 (three) times daily as needed (for gas/indigestion.).    [provider]  traMADol (ULTRAM) 50 MG tablet Take 1-2 tablets (50-100 mg total) by mouth every 6 (six) hours as needed for moderate pain. 06/01/18   Ardis Hughs, MD    Allergies    Bee venom, Poison oak extract, and Clindamycin/lincomycin  Review of Systems   Review of Systems  Constitutional: Negative for fever.  HENT: Negative for sore throat.   Eyes: Negative for redness.  Respiratory: Negative for shortness of breath.     Cardiovascular: Negative for chest pain.  Gastrointestinal: Negative for abdominal pain.  Genitourinary: Negative for flank pain and testicular pain.  Musculoskeletal: Negative for back pain.  Skin: Negative for rash.  Neurological: Negative for weakness and numbness.  Hematological: Does not bruise/bleed easily.  Psychiatric/Behavioral: Negative for confusion.    Physical Exam Updated Vital Signs BP 136/84 (BP Location: Left Arm)   Pulse 88   Temp 98.6 F (37 C) (Oral)   Resp 16   Ht 1.74 m (5' 8.5")   Wt 99.8 kg   SpO2 97%   BMI 32.96 kg/m   Physical Exam Vitals and nursing note reviewed.  Constitutional:      Appearance: Normal appearance. He is well-developed.  HENT:     Head: Atraumatic.     Nose: Nose normal.     Mouth/Throat:     Mouth: Mucous membranes are moist.     Pharynx: Oropharynx is clear.  Eyes:     General: No scleral icterus.    Conjunctiva/sclera: Conjunctivae normal.  Neck:     Trachea: No tracheal deviation.  Cardiovascular:     Rate and Rhythm: Normal rate.     Pulses: Normal pulses.  Pulmonary:     Effort: Pulmonary effort is normal. No accessory muscle usage or respiratory distress.  Abdominal:     General: Bowel sounds are normal. There is no distension.     Palpations: Abdomen is soft.     Tenderness: There is no abdominal tenderness.     Comments: No pulsatile mass.   Genitourinary:    Comments: No cva tenderness. Musculoskeletal:        General: No swelling.     Cervical back: Normal range of motion and neck supple. No rigidity.     Comments: T/L/S spine non tender, aligned. Good passive rom at right hip and knee without pain. Distal pulses palp. No leg swelling. No skin changes, erythema, lesions, or sts in area of hip pain.   Skin:    General: Skin is warm and dry.     Findings: No rash.  Neurological:     Mental Status: He is alert.     Comments: Alert, speech clear. RLE motor 5/5. sens grossly intact.   Psychiatric:         Mood and Affect: Mood normal.     ED Results / Procedures / Treatments   Labs (all labs ordered are listed, but only abnormal results are displayed) Labs Reviewed - No data to display  EKG None  Radiology XR Hip Right  Result Date: 06/19/2019 CLINICAL DATA:  Right hip pain for 2.5 weeks. EXAM: DG HIP (WITH OR WITHOUT PELVIS) 2-3V RIGHT COMPARISON:  CT scan of the abdomen and pelvis dated 05/25/2018 FINDINGS: There is no evidence of hip fracture or dislocation. There  are minimal cystic degenerative changes of the superolateral aspect of the right acetabulum. There are similar changes in the left hip. IMPRESSION: No acute abnormalities. Minimal degenerative changes of the right hip. Electronically Signed   By: Lorriane Shire M.D.   On: 06/19/2019 15:39    Procedures Procedures (including critical care time)  Medications Ordered in ED Medications - No data to display  ED Course  I have reviewed the triage vital signs and the nursing notes.  Pertinent labs & imaging results that were available during my care of the patient were reviewed by me and considered in my medical decision making (see chart for details).    MDM Rules/Calculators/A&P   Xrays ordered.   Reviewed nursing notes and prior charts for additional history.   Xrays reviewed/interpreted by me - no fx.   Ultram po.  Recheck pt comfortable. Discussed xr results.   Rec pcp/ortho follow up.  Return precautions discussed.    Final Clinical Impression(s) / ED Diagnoses Final diagnoses:  None    Rx / DC Orders ED Discharge Orders    None       Lajean Saver, MD 06/19/19 1550

## 2020-01-05 ENCOUNTER — Telehealth: Payer: Self-pay | Admitting: Oncology

## 2020-01-05 NOTE — Telephone Encounter (Signed)
Received a new pt referral from Dr. [name] at [facility] for consideration of treatment for metastatic kidney cancer. [name] has been cld and scheduled to see Dr. [name] on 7/14 at 11am. Pt aware to arrive 15 minutes early.

## 2020-01-16 ENCOUNTER — Other Ambulatory Visit (HOSPITAL_COMMUNITY): Payer: Self-pay | Admitting: Urology

## 2020-01-16 ENCOUNTER — Encounter (HOSPITAL_COMMUNITY): Payer: Self-pay

## 2020-01-16 DIAGNOSIS — D49511 Neoplasm of unspecified behavior of right kidney: Secondary | ICD-10-CM

## 2020-01-16 NOTE — Progress Notes (Signed)
[facility]   James Massey "Richard" Male, 74 y.o., 06/15/1946 MRN:  [id number] Phone:  [phone] (H) ... PCP:  Nicola Girt, DO Primary Cvg:  Medicare/Medicare Part A And B Next Appt With Radiology ([facility]) 01/19/2020 at 9:00 AM  RE: Biopsy Received: Today Message Details  Markus Daft, MD  Ernestene Mention for CT guided biopsy of right ischial bone lesion. See "nephrographic" image set from 12/27/19.    [place]   Previous Messages  ----- Message -----  From: Lenore Cordia  Sent: 01/16/2020 12:39 PM EDT  To: Ir Procedure Requests  Subject: Biopsy                      Procedure Requested: Interventional Radiology With and without iv contrast   Reason for Procedure: right renal neoplasm    Provider Requesting: [name]  Provider Telephone: [phone]    Other Info: h/o renal cell, now with tumor on inferior ramus, concerning for metastatic disease, Please perform biopsy of this tumor

## 2020-01-17 ENCOUNTER — Other Ambulatory Visit: Payer: Self-pay | Admitting: Radiology

## 2020-01-18 ENCOUNTER — Inpatient Hospital Stay: Payer: Medicare Other | Attending: Oncology | Admitting: Oncology

## 2020-01-18 ENCOUNTER — Other Ambulatory Visit: Payer: Self-pay

## 2020-01-18 ENCOUNTER — Other Ambulatory Visit: Payer: Self-pay | Admitting: Radiology

## 2020-01-18 VITALS — BP 138/83 | HR 92 | Temp 98.1°F | Resp 18 | Ht 68.5 in | Wt 208.1 lb

## 2020-01-18 DIAGNOSIS — N2889 Other specified disorders of kidney and ureter: Secondary | ICD-10-CM

## 2020-01-18 DIAGNOSIS — E032 Hypothyroidism due to medicaments and other exogenous substances: Secondary | ICD-10-CM

## 2020-01-18 DIAGNOSIS — R918 Other nonspecific abnormal finding of lung field: Secondary | ICD-10-CM

## 2020-01-18 DIAGNOSIS — C641 Malignant neoplasm of right kidney, except renal pelvis: Secondary | ICD-10-CM

## 2020-01-18 DIAGNOSIS — Z905 Acquired absence of kidney: Secondary | ICD-10-CM

## 2020-01-18 DIAGNOSIS — Z85528 Personal history of other malignant neoplasm of kidney: Secondary | ICD-10-CM | POA: Diagnosis not present

## 2020-01-18 NOTE — Progress Notes (Signed)
Reason for the request:   Kidney cancer  HPI: I was asked by Dr. [name] to evaluate James Massey for the evaluation of renal cell carcinoma.  He is a 74 year old man presented with gross hematuria and a large renal mass adjacent to the collecting system on the right side.  He underwent laparoscopic right radical nephrectomy completed on May 31, 2018 under the care of Dr. [name].  The final pathology showed clear cell histology with nuclear grade 3 with the tumor invading into the renal sinus fatty tissue indicating T3a disease.  He has no evidence of metastatic disease at that time.  He remained on active surveillance as CT scan obtained on December 27, 2019 showed a new peripheral subpleural tiny pulmonary nodule in the posterior segment of the right upper lobe and posterior right lower lobe.  A large hyperdense expansile mass in the right inferior pubic ramus measuring 5.2 x 6.8 cm with a small lytic lesion in the right acetabular roof.  Based on these findings, he is set up to have a tissue biopsy on January 19, 2020.  Clinically, he reports no major complaints at this time.  He continues to have issues with his right hip predominantly while weightbearing.  He denies any pain at rest.  He denies any recent falls or syncope.  He denies any hospitalization or illnesses.  Denies any pulmonary complaints or decline in his performance status.  He does not report any headaches, blurry vision, syncope or seizures. Does not report any fevers, chills or sweats.  Does not report any cough, wheezing or hemoptysis.  Does not report any chest pain, palpitation, orthopnea or leg edema.  Does not report any nausea, vomiting or abdominal pain.  Does not report any constipation or diarrhea.  Does not report any skeletal complaints.    Does not report frequency, urgency or hematuria.  Does not report any skin rashes or lesions. Does not report any heat or cold intolerance.  Does not report any lymphadenopathy or petechiae.  Does  not report any anxiety or depression.  Remaining review of systems is negative.    Past Medical History:  Diagnosis Date  . Anxiety   . Barrett esophagus   . GERD (gastroesophageal reflux disease)   . Gout   . Heart murmur   . History of anemia   . Kidney cancer, primary, 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 to other site [facility])   . Mixed hyperlipidemia   . Right renal mass   . Wears glasses   :  Past Surgical History:  Procedure Laterality Date  . CYSTOSCOPY WITH RETROGRADE PYELOGRAM, URETEROSCOPY AND STENT PLACEMENT Right 05/21/2018   Procedure: RIGHT RETROGRADE PYELOGRAM, RIGHT DIAGNOSTIC URETEROSCOPY AND STENT PLACEMENT;  Surgeon: Ardis Hughs, MD;  Location: [facility];  Service: Urology;  Laterality: Right;  . INGUINAL HERNIA REPAIR Bilateral 09/2012  . LAPAROSCOPIC NEPHRECTOMY, HAND ASSISTED Right 05/31/2018   Procedure: LAPAROSCOPIC RADICAL RIGHT NEPHRECTOMY;  Surgeon: Ardis Hughs, MD;  Location: [facility];  Service: Urology;  Laterality: Right;  :   Current Outpatient Medications:  .  allopurinol (ZYLOPRIM) 300 MG tablet, Take 300 mg by mouth at bedtime. , Disp: , Rfl:  .  aspirin EC 81 MG tablet, Take 81 mg by mouth at bedtime. , Disp: , Rfl:  .  atorvastatin (LIPITOR) 40 MG tablet, Take 40 mg by mouth at bedtime. , Disp: , Rfl:  .  diazepam (VALIUM) 5 MG tablet, Take 2.5-5 mg by mouth every 12 (twelve) hours as needed (  for eye twitching). , Disp: , Rfl:  .  hydrocortisone 2.5 % cream, Apply 1 application topically 2 (two) times daily as needed (for hemorroidal flare ups)., Disp: , Rfl:  .  pantoprazole (PROTONIX) 40 MG tablet, Take 40 mg by mouth 2 (two) times daily., Disp: , Rfl:  .  pseudoephedrine-acetaminophen (TYLENOL SINUS) 30-500 MG TABS tablet, Take 1-2 tablets by mouth every 4 (four) hours as needed (for sinus issues)., Disp: , Rfl:  .  Simethicone 180 MG CAPS, Take 180 mg by mouth 3 (three) times daily as needed (for gas/indigestion.)., Disp: , Rfl:  .  traMADol  (ULTRAM) 50 MG tablet, Take 1-2 tablets (50-100 mg total) by mouth every 6 (six) hours as needed for moderate pain., Disp: 15 tablet, Rfl: 0 .  traMADol (ULTRAM) 50 MG tablet, Take 1 tablet (50 mg total) by mouth every 8 (eight) hours as needed., Disp: 15 tablet, Rfl: 0:  Allergies  Allergen Reactions  . Bee Venom Swelling  . Poison [employer] and ivy  . Clindamycin/Lincomycin Rash  :  No family history on file.:  Social History   Socioeconomic History  . Marital status: Married    Spouse name: Not on file  . Number of children: Not on file  . Years of education: Not on file  . Highest education level: Not on file  Occupational History  . Not on file  Tobacco Use  . Smoking status: Former Smoker    Years: 15.00    Types: Cigarettes    Quit date: 12/12/1987    Years since quitting: 32.1  . Smokeless tobacco: Never Used  Vaping Use  . Vaping Use: Never used  Substance and Sexual Activity  . Alcohol use: Yes    Comment: occ beer   . Drug use: Never  . Sexual activity: Not Currently  Other Topics Concern  . Not on file  Social History Narrative  . Not on file   Social Determinants of Health   Financial Resource Strain:   . Difficulty of Paying Living Expenses:   Food Insecurity:   . Worried About Charity fundraiser in the Last Year:   . Arboriculturist in the Last Year:   Transportation Needs:   . Film/video editor (Medical):   Marland Kitchen Lack of Transportation (Non-Medical):   Physical Activity:   . Days of Exercise per Week:   . Minutes of Exercise per Session:   Stress:   . Feeling of Stress :   Social Connections:   . Frequency of Communication with Friends and Family:   . Frequency of Social Gatherings with Friends and Family:   . Attends Religious Services:   . Active Member of Clubs or Organizations:   . Attends Archivist Meetings:   Marland Kitchen Marital Status:   Intimate Partner Violence:   . Fear of Current or Ex-Partner:   .  Emotionally Abused:   Marland Kitchen Physically Abused:   . Sexually Abused:   :  Pertinent items are noted in HPI.  Exam: Blood pressure 138/83, pulse 92, temperature 98.1 F (36.7 C), temperature source Temporal, resp. rate 18, height 5' 8.5" (1.74 m), weight 208 lb 1.6 oz (94.4 kg), SpO2 97 %.  ECOG 1  General appearance: alert and cooperative appeared without distress. Head: atraumatic without any abnormalities. Eyes: conjunctivae/corneas clear. PERRL.  Sclera anicteric. Throat: lips, mucosa, and tongue normal; without oral thrush or ulcers. Resp: clear to auscultation bilaterally without rhonchi,  wheezes or dullness to percussion. Cardio: regular rate and rhythm, S1, S2 normal, no murmur, click, rub or gallop GI: soft, non-tender; bowel sounds normal; no masses,  no organomegaly Skin: Skin color, texture, turgor normal. No rashes or lesions Lymph nodes: Cervical, supraclavicular, and axillary nodes normal. Neurologic: Grossly normal without any motor, sensory or deep tendon reflexes. Musculoskeletal: No joint deformity or effusion.  CBC    Component Value Date/Time   WBC 18.7 (H) 06/01/2018 0525   RBC 4.17 (L) 06/01/2018 0525   HGB 13.2 06/01/2018 0525   HCT 39.7 06/01/2018 0525   PLT 313 06/01/2018 0525   MCV 95.2 06/01/2018 0525   MCH 31.7 06/01/2018 0525   MCHC 33.2 06/01/2018 0525   RDW 11.9 06/01/2018 0525     Chemistry      Component Value Date/Time   NA 134 (L) 06/01/2018 0525   K 4.6 06/01/2018 0525   CL 99 06/01/2018 0525   CO2 27 06/01/2018 0525   BUN 19 06/01/2018 0525   CREATININE 1.66 (H) 06/01/2018 0525      Component Value Date/Time   CALCIUM 8.5 (L) 06/01/2018 0525   ALKPHOS 61 05/26/2018 1100   AST 25 05/26/2018 1100   ALT 28 05/26/2018 1100   BILITOT 1.1 05/26/2018 1100       Assessment and Plan:    74 year old man with:  1.  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  He presented with hematuria found to have a large right  kidney mass.  He underwent laparoscopic right radical nephrectomy on May 31, 2018 with a final pathology showed T3a N0 clear-cell renal cell carcinoma with negative margins.  CT scan obtained on December 27, 2019 showed a 5.2 x 6.8 expansile lytic lesion in the right inferior pubic ramus.  He did have a small pulmonary nodules as well.  These findings were reviewed today in detail with the patient and his wife.  Imaging studies as well as pathology reports were discussed in detail.  The differential diagnosis of these findings include metastatic renal cell carcinoma versus other malignancy.  He is scheduled to have tissue biopsy which likely will confirm that he has indeed stage IV renal cell carcinoma.  Management options were reviewed at this time.  Local therapy with radiation would be reasonable and he is under evaluation by Dr. Tammi Klippel will have a consultation in the near future.  He will likely require systemic therapy after that.  Systemic therapy options were discussed today in detail.  These would include oral targeted therapy, combination of immunotherapy agents as well as combination of both immunotherapy and oral targeted therapy.  Risks and benefits of all these approaches were reviewed.  Potential complications were also discussed.  These would include nausea, fatigue, hypertension and immune mediated complications.  I feel he would be a reasonable candidate for combination of immunotherapy and oral targeted therapy in the form of Pembrolizumab and axitinib upon completing radiation once his pathology has been confirmed.  He is agreeable with this plan and will have consultation with Dr. Tammi Klippel after his biopsy which is scheduled tomorrow.  2.  Prognosis and goals of care: This was discussed today in detail.  He understands he has an incurable malignancy but disease that can be treated and palliated for a period of time.  3. Follow up: Will be in the next week weeks to follow his  progress.    60  minutes were dedicated to this visit. The time was spent on reviewing imaging  studies, discussing treatment options, discussing differential diagnosis and answering questions regarding future plan.    A copy of this consult has been forwarded to the requesting physician.

## 2020-01-19 ENCOUNTER — Other Ambulatory Visit: Payer: Self-pay

## 2020-01-19 ENCOUNTER — Encounter (HOSPITAL_COMMUNITY): Payer: Self-pay

## 2020-01-19 ENCOUNTER — Ambulatory Visit (HOSPITAL_COMMUNITY)
Admission: RE | Admit: 2020-01-19 | Discharge: 2020-01-19 | Disposition: A | Discharge status: Home / Self Care | Payer: Medicare Other | Source: Ambulatory Visit | Attending: Diagnostic Radiology | Admitting: Diagnostic Radiology

## 2020-01-19 ENCOUNTER — Ambulatory Visit (HOSPITAL_COMMUNITY)
Admission: RE | Admit: 2020-01-19 | Discharge: 2020-01-19 | Disposition: A | Discharge status: Home / Self Care | Payer: Medicare Other | Source: Ambulatory Visit | Attending: Urology | Admitting: Urology

## 2020-01-19 DIAGNOSIS — D49511 Neoplasm of unspecified behavior of right kidney: Secondary | ICD-10-CM

## 2020-01-19 DIAGNOSIS — C641 Malignant neoplasm of right kidney, except renal pelvis: Secondary | ICD-10-CM | POA: Diagnosis present

## 2020-01-19 LAB — BASIC METABOLIC PANEL
Anion gap: 12 (ref 5–15)
BUN: 15 mg/dL (ref 8–23)
CO2: 23 mmol/L (ref 22–32)
Calcium: 9.3 mg/dL (ref 8.9–10.3)
Chloride: 103 mmol/L (ref 98–111)
Creatinine, Ser: 1.57 mg/dL — ABNORMAL HIGH (ref 0.61–1.24)
GFR calc Af Amer: 50 mL/min — ABNORMAL LOW (ref >60)
GFR calc non Af Amer: 43 mL/min — ABNORMAL LOW (ref >60)
Glucose, Bld: 115 mg/dL — ABNORMAL HIGH (ref 70–99)
Potassium: 4.1 mmol/L (ref 3.5–5.1)
Sodium: 138 mmol/L (ref 135–145)

## 2020-01-19 LAB — CBC WITH DIFFERENTIAL/PLATELET
Abs Immature Granulocytes: 0.02 10*3/uL (ref 0.00–0.07)
Basophils Absolute: 0.1 10*3/uL (ref 0.0–0.1)
Basophils Relative: 1 %
Eosinophils Absolute: 0.3 10*3/uL (ref 0.0–0.5)
Eosinophils Relative: 4 %
HCT: 42.9 % (ref 39.0–52.0)
Hemoglobin: 14.6 g/dL (ref 13.0–17.0)
Immature Granulocytes: 0 %
Lymphocytes Relative: 31 %
Lymphs Abs: 2.6 10*3/uL (ref 0.7–4.0)
MCH: 32.2 pg (ref 26.0–34.0)
MCHC: 34 g/dL (ref 30.0–36.0)
MCV: 94.5 fL (ref 80.0–100.0)
Monocytes Absolute: 0.8 10*3/uL (ref 0.1–1.0)
Monocytes Relative: 10 %
Neutro Abs: 4.6 10*3/uL (ref 1.7–7.7)
Neutrophils Relative %: 54 %
Platelets: 247 10*3/uL (ref 150–400)
RBC: 4.54 MIL/uL (ref 4.22–5.81)
RDW: 12.2 % (ref 11.5–15.5)
WBC: 8.5 10*3/uL (ref 4.0–10.5)
nRBC: 0 % (ref 0.0–0.2)

## 2020-01-19 LAB — PROTIME-INR
INR: 1 (ref 0.8–1.2)
Prothrombin Time: 12.6 seconds (ref 11.4–15.2)

## 2020-01-19 IMAGING — CT CT BIOPSY
2 of 4 series · 15 of 29 positions shown, 17 images · non-contrast
Comparison: CT of the chest, abdomen and pelvis-[DATE]

INDICATION: History of renal cell carcinoma, now with lytic lesion involving the
right inferior pubic ramus. Please perform CT-guided biopsy for
tissue diagnostic purposes.

EXAM:
CT GUIDED BIOPSY OF LYTIC LESION INVOLVING THE RIGHT INFERIOR PUBIC
RAMUS

[Series 2: i-spiral 5.0 b40f · axial · 0.91mm/px · z∈[+1148,+1252]mm · 8 of 40 slices shown, 10 images (1 of 2)]
[im 5/40  mediastinal]
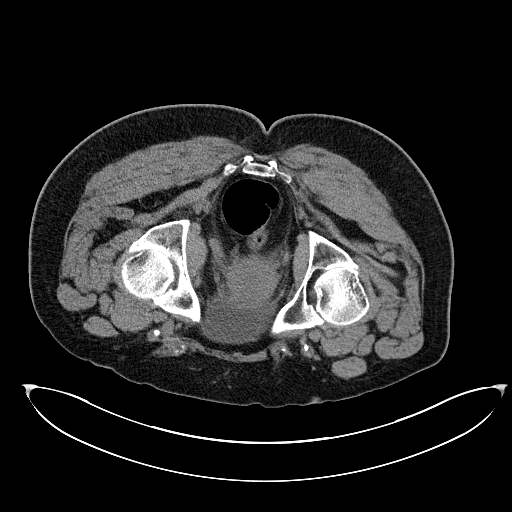
[im 5/40  lung]
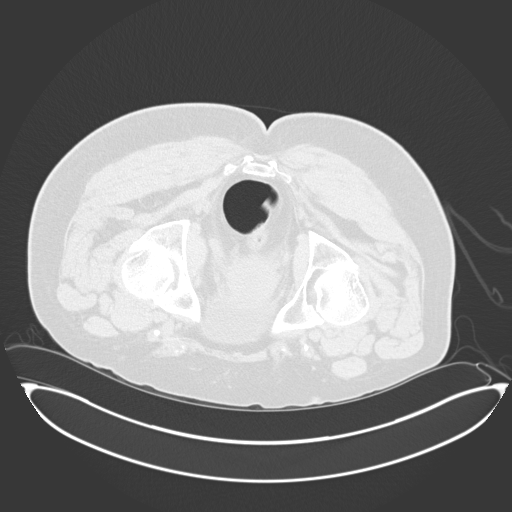
[im 9/40  lung]
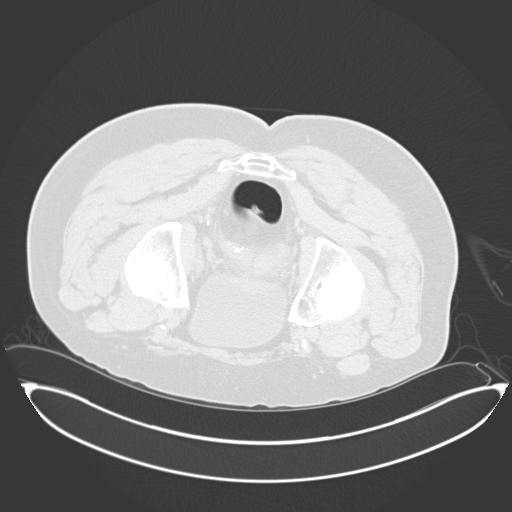
[im 14/40  lung]
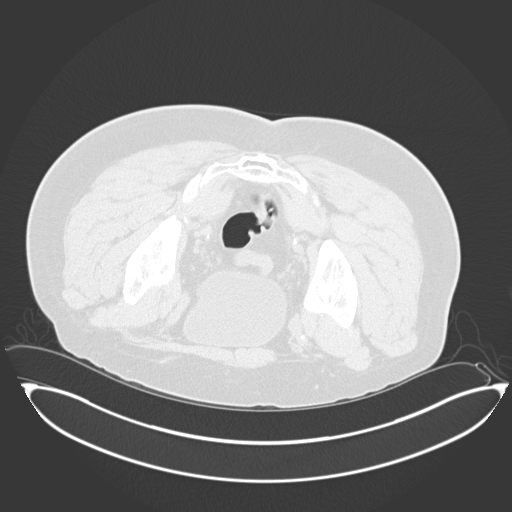
[im 18/40  lung]
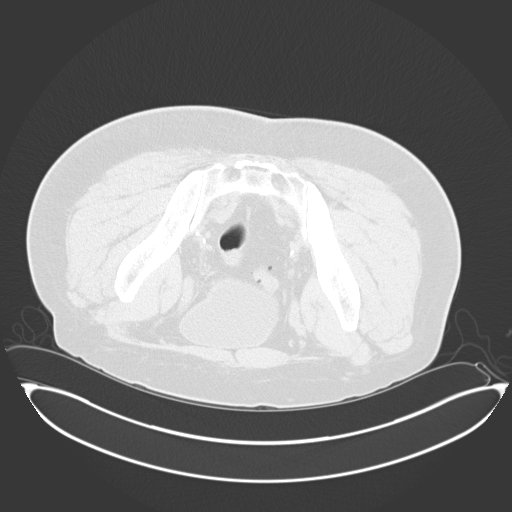
[im 22/40  mediastinal]
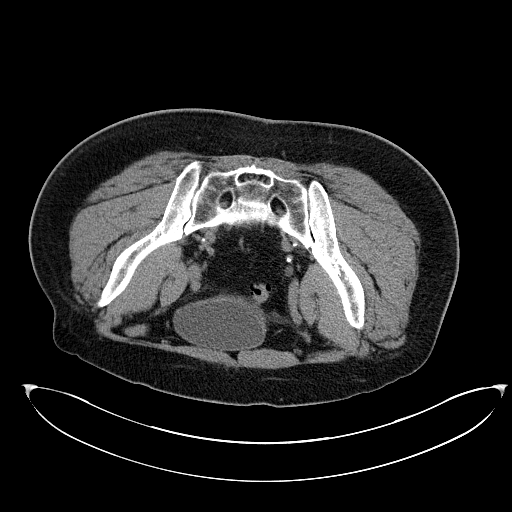
[im 22/40  lung]
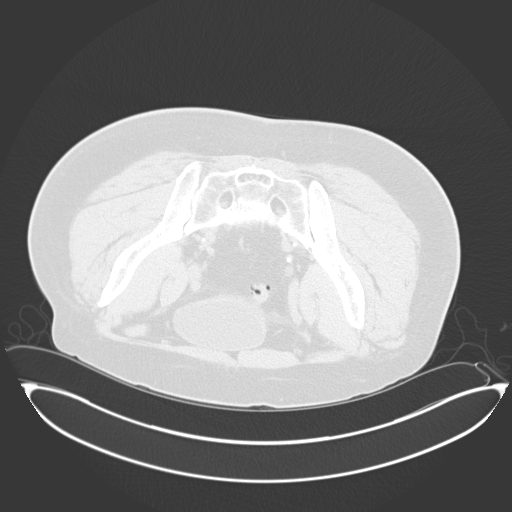
[im 27/40  lung]
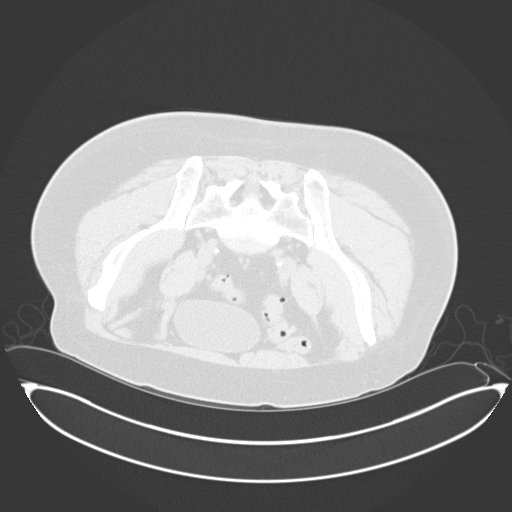
[im 31/40  lung]
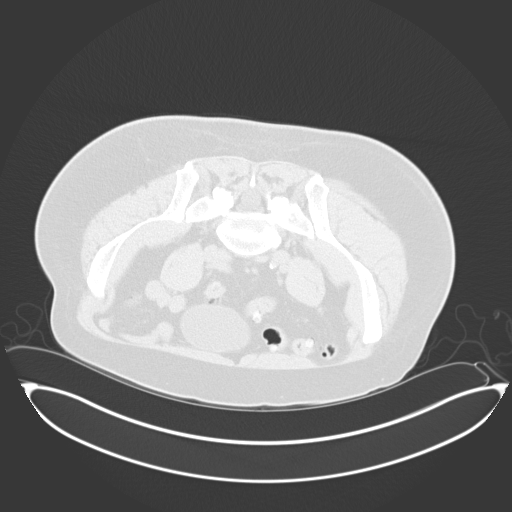
[im 35/40  lung]
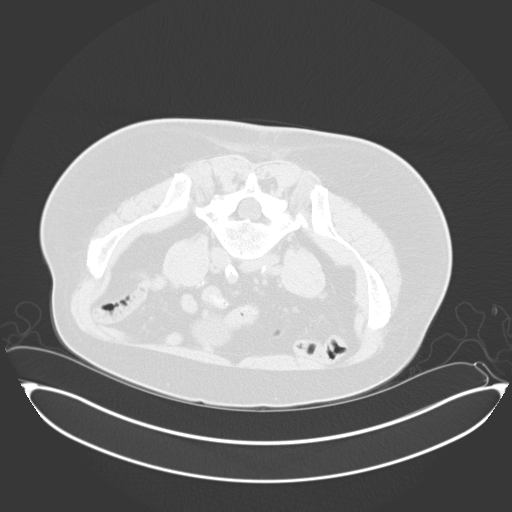

[Series 3: i-spiral 5.0 b40f · axial · 0.91mm/px · z∈[+1057,+1148]mm · 7 of 34 slices shown (2 of 2)]
[im 5/34  lung]
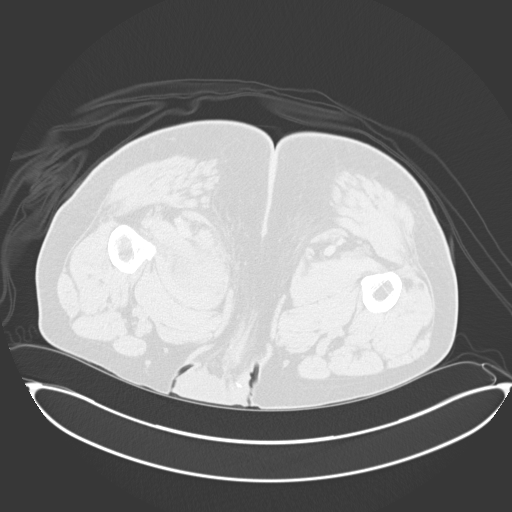
[im 10/34  lung]
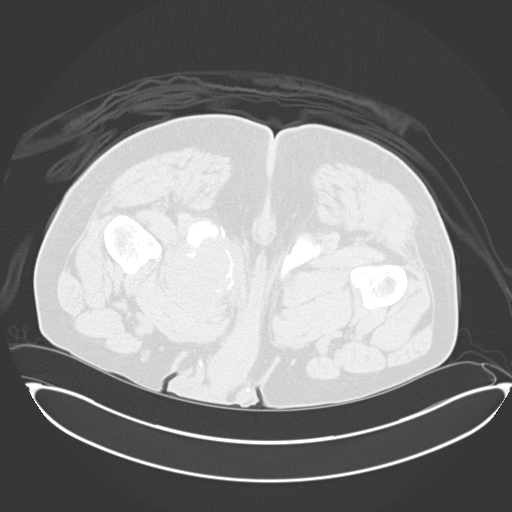
[im 15/34  lung]
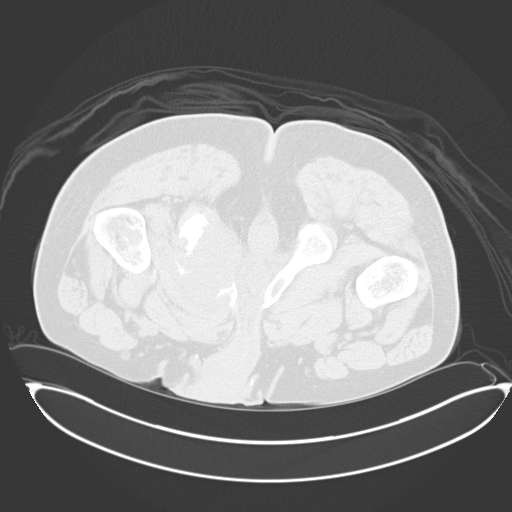
[im 19/34  lung]
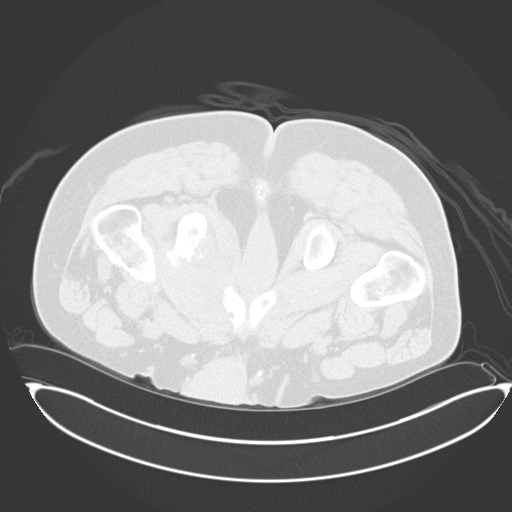
[im 24/34  lung]
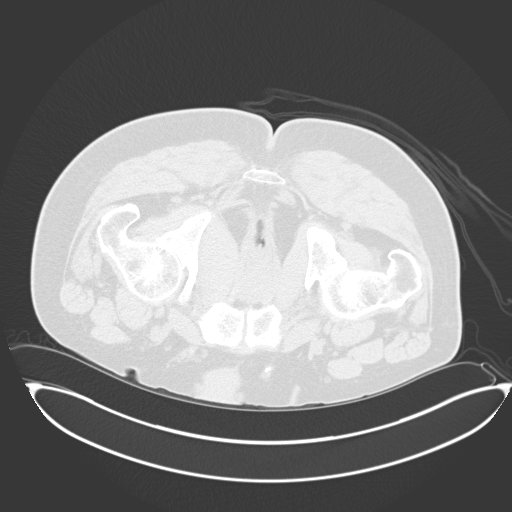
[im 29/34  lung]
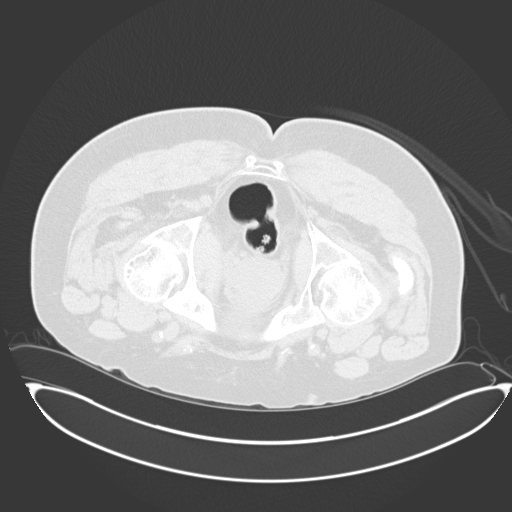
[im 31/34  lung]
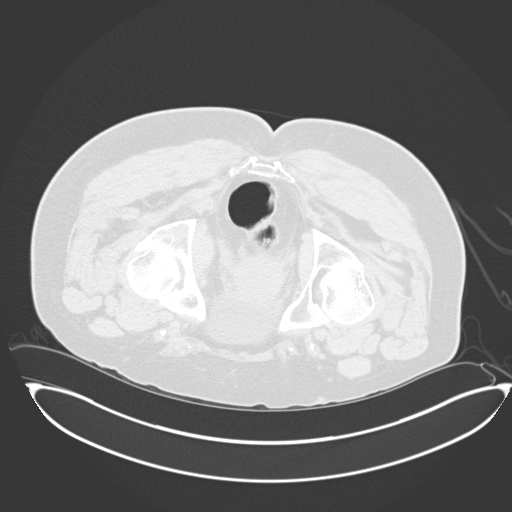

[15 of 29 positions shown; findings below may reference images not displayed]

MEDICATIONS:
None.

ANESTHESIA/SEDATION:
Fentanyl 100 mcg IV; Versed 2 mg IV

Sedation time: 19 minutes; The patient was continuously monitored
during the procedure by the interventional radiology nurse under my
direct supervision.

CONTRAST:  None.

COMPLICATIONS:
None immediate.

PROCEDURE:
Informed consent was obtained from the patient following an
explanation of the procedure, risks, benefits and alternatives. A
time out was performed prior to the initiation of the procedure.

The patient was positioned prone on the CT table and a limited CT
was performed for procedural planning demonstrating unchanged size
and appearance of the approximately 5.6 x 5.2 cm lytic expansile
lesion involving the right inferior pubic ramus (image 22, series
3). The procedure was planned. The operative site was prepped and
draped in the usual sterile fashion. Appropriate trajectory was
confirmed with a 22 gauge spinal needle after the adjacent tissues
were anesthetized with 1% Lidocaine with epinephrine.

Under intermittent CT guidance, a 17 gauge coaxial needle was
advanced into the peripheral aspect of the mass.

Appropriate positioning was confirmed and 6 core needle biopsy
samples were obtained with an 18 gauge core needle biopsy device.
The co-axial needle was removed following administration of a
Gel-Foam slurry and superficial hemostasis was achieved with manual
compression.

A limited postprocedural CT was negative for hemorrhage or
additional complication. A dressing was placed. The patient
tolerated the procedure well without immediate postprocedural
complication.
IMPRESSION: Technically successful CT guided core needle biopsy of lytic,
expansile lesion involving the right inferior pubic ramus.

## 2020-01-19 MED ORDER — NALOXONE HCL 0.4 MG/ML IJ SOLN
INTRAMUSCULAR | Status: AC
  Filled 2020-01-19: qty 1

## 2020-01-19 MED ORDER — FENTANYL CITRATE (PF) 100 MCG/2ML IJ SOLN
INTRAMUSCULAR | Status: AC
  Filled 2020-01-19: qty 2

## 2020-01-19 MED ORDER — MIDAZOLAM HCL 2 MG/2ML IJ SOLN
INTRAMUSCULAR | Status: AC | PRN
  Administered 2020-01-19 (×2): 1 mg via INTRAVENOUS

## 2020-01-19 MED ORDER — LIDOCAINE-EPINEPHRINE (PF) 1 %-1:200000 IJ SOLN
INTRAMUSCULAR | Status: AC | PRN
  Administered 2020-01-19: 10 mL via INTRADERMAL

## 2020-01-19 MED ORDER — SODIUM CHLORIDE 0.9 % IV SOLN: INTRAVENOUS | Status: DC

## 2020-01-19 MED ORDER — FENTANYL CITRATE (PF) 100 MCG/2ML IJ SOLN
INTRAMUSCULAR | Status: AC | PRN
  Administered 2020-01-19 (×2): 25 ug via INTRAVENOUS
  Administered 2020-01-19: 50 ug via INTRAVENOUS

## 2020-01-19 MED ORDER — MIDAZOLAM HCL 2 MG/2ML IJ SOLN
INTRAMUSCULAR | Status: AC
  Filled 2020-01-19: qty 2

## 2020-01-19 MED ORDER — FLUMAZENIL 0.5 MG/5ML IV SOLN
INTRAVENOUS | Status: AC
  Filled 2020-01-19: qty 5

## 2020-01-19 NOTE — Discharge Instructions (Addendum)
Please call Interventional Radiology clinic [phone] with any questions or concerns.  You may remove your dressing and shower tomorrow.   Needle Biopsy of the Bone, Care After This sheet gives you information about how to care for yourself after your procedure. Your health care provider may also give you more specific instructions. If you have problems or questions, contact your health care provider. What can I expect after the procedure? After the procedure, it is common to have soreness or tenderness at the puncture site. Follow these instructions at home: Puncture care   Follow instructions from your health care provider about how to take care of your puncture site. Make sure you: ? Wash your hands with soap and water before and after you change your bandage (dressing). If soap and water are not available, use hand sanitizer. ? Change your dressing as told by your health care provider.  Check your puncture site every day for signs of infection. Check for: ? Redness, swelling, or worsening pain. ? Fluid or blood. ? Warmth. ? Pus or a bad smell. General instructions  Take over-the-counter and prescription medicines only as told by your health care provider.  Do not drive for 24 hours if you were given a sedative during your procedure.  Return to your normal activities as told by your health care provider.  Keep all follow-up visits as told by your health care provider. This is important. Contact a health care provider if:  You have redness, swelling, or worsening pain at the site of your puncture.  You have fluid or blood coming from your puncture site.  Your puncture site feels warm to the touch.  You have pus or a bad smell coming from your puncture site.  You have a fever.  You have persistent nausea or vomiting. Get help right away if:  You develop a rash.  You have difficulty breathing. Summary  After the procedure, it is common to have soreness or  tenderness at the puncture site.  Follow instructions from your health care provider about how to take care of your puncture site.  Contact a health care provider if you have any signs of infection.  Keep all follow-up visits as told by your health care provider. This is important. This information is not intended to replace advice given to you by your health care provider. Make sure you discuss any questions you have with your health care provider. Document Revised: 03/04/2018 Document Reviewed: 03/04/2018 Elsevier Patient Education  2020 [employer]    Moderate Conscious Sedation, Adult, Care After These instructions provide you with information about caring for yourself after your procedure. Your health care provider may also give you more specific instructions. Your treatment has been planned according to current medical practices, but problems sometimes occur. Call your health care provider if you have any problems or questions after your procedure. What can I expect after the procedure? After your procedure, it is common:  To feel sleepy for several hours.  To feel clumsy and have poor balance for several hours.  To have poor judgment for several hours.  To vomit if you eat too soon. Follow these instructions at home: For at least 24 hours after the procedure:   Do not: ? Participate in activities where you could fall or become injured. ? Drive. ? Use heavy machinery. ? Drink alcohol. ? Take sleeping pills or medicines that cause drowsiness. ? Make important decisions or sign legal documents. ? Take care of children on your own.    Rest. Eating and drinking  Follow the diet recommended by your health care provider.  If you vomit: ? Drink water, juice, or soup when you can drink without vomiting. ? Make sure you have little or no nausea before eating solid foods. General instructions  Have a responsible adult stay with you until you are awake and alert.  Take  over-the-counter and prescription medicines only as told by your health care provider.  If you smoke, do not smoke without supervision.  Keep all follow-up visits as told by your health care provider. This is important. Contact a health care provider if:  You keep feeling nauseous or you keep vomiting.  You feel light-headed.  You develop a rash.  You have a fever. Get help right away if:  You have trouble breathing. This information is not intended to replace advice given to you by your health care provider. Make sure you discuss any questions you have with your health care provider. Document Revised: 06/05/2017 Document Reviewed: 10/13/2015 Elsevier Patient Education  2020 [employer]   

## 2020-01-19 NOTE — H&P (Signed)
Chief Complaint: Patient was seen in consultation today for right ischial bone lesion/biopsy.  Referring Physician(s): Herrick,Benjamin W  Supervising Physician: Sandi Mariscal  Patient Status: [facility] - Out-pt  History of Present Illness: James Massey is a 74 y.o. male with a past medical history of hyperlipidemia, GERD, Barrett esophagus, RCC, anemia, gout, and anxiety. He was unfortunately diagnosed with RCC in 2019. He underwent a right radical nephrectomy on 05/31/2018 by Dr. [name]. Recent follow-up imaging revealed a right ischial bone lesion, seen on CT 12/2019.  IR requested by Dr. [name] for possible image-guided right ischial bone lesion biopsy for tissue diagnosis. Patient awake and alert sitting in bed with no complaints at this time. Denies fever, chills, chest pain, dyspnea, abdominal pain, or headache.   Past Medical History:  Diagnosis Date  . Anxiety   . Barrett esophagus   . GERD (gastroesophageal reflux disease)   . Gout   . Heart murmur   . History of anemia   . Kidney cancer, primary, 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 to other site [facility])   . Mixed hyperlipidemia   . Right renal mass   . Wears glasses     Past Surgical History:  Procedure Laterality Date  . CYSTOSCOPY WITH RETROGRADE PYELOGRAM, URETEROSCOPY AND STENT PLACEMENT Right 05/21/2018   Procedure: RIGHT RETROGRADE PYELOGRAM, RIGHT DIAGNOSTIC URETEROSCOPY AND STENT PLACEMENT;  Surgeon: Ardis Hughs, MD;  Location: [facility];  Service: Urology;  Laterality: Right;  . INGUINAL HERNIA REPAIR Bilateral 09/2012  . LAPAROSCOPIC NEPHRECTOMY, HAND ASSISTED Right 05/31/2018   Procedure: LAPAROSCOPIC RADICAL RIGHT NEPHRECTOMY;  Surgeon: Ardis Hughs, MD;  Location: [facility];  Service: Urology;  Laterality: Right;    Allergies: Bee venom, Poison oak extract, and Clindamycin/lincomycin  Medications: Prior to Admission medications   Medication Sig Start Date End Date Taking? Authorizing Provider    allopurinol (ZYLOPRIM) 300 MG tablet Take 300 mg by mouth at bedtime.    Yes [provider]  atorvastatin (LIPITOR) 40 MG tablet Take 40 mg by mouth at bedtime.    Yes [provider]  diazepam (VALIUM) 5 MG tablet Take 2.5-5 mg by mouth every 12 (twelve) hours as needed (for eye twitching).    Yes [provider]  hydrocortisone 2.5 % cream Apply 1 application topically 2 (two) times daily as needed (for hemorroidal flare ups).   Yes [provider]  magnesium 30 MG tablet Take 30 mg by mouth 2 (two) times daily.   Yes [provider]  pantoprazole (PROTONIX) 40 MG tablet Take 40 mg by mouth 2 (two) times daily.   Yes [provider]  pseudoephedrine-acetaminophen (TYLENOL SINUS) 30-500 MG TABS tablet Take 1-2 tablets by mouth every 4 (four) hours as needed (for sinus issues).   Yes [provider]  Simethicone 180 MG CAPS Take 180 mg by mouth 3 (three) times daily as needed (for gas/indigestion.).   Yes [provider]  aspirin EC 81 MG tablet Take 81 mg by mouth at bedtime.  Patient not taking: Reported on 01/18/2020    [provider]  colchicine 0.6 MG tablet Take 0.6 mg by mouth as needed.    [provider]  traMADol (ULTRAM) 50 MG tablet Take 1-2 tablets (50-100 mg total) by mouth every 6 (six) hours as needed for moderate pain. Patient not taking: Reported on 01/18/2020 06/01/18   Ardis Hughs, MD  traMADol (ULTRAM) 50 MG tablet Take 1 tablet (50 mg total) by mouth every 8 (eight) hours  as needed. Patient not taking: Reported on 01/18/2020 06/19/19   Lajean Saver, MD     History reviewed. No pertinent family history.  Social History   Socioeconomic History  . Marital status: Married    Spouse name: Not on file  . Number of children: Not on file  . Years of education: Not on file  . Highest education level: Not on file  Occupational History  . Not on file  Tobacco Use  . Smoking  status: Former Smoker    Years: 15.00    Types: Cigarettes    Quit date: 12/12/1987    Years since quitting: 32.1  . Smokeless tobacco: Never Used  Vaping Use  . Vaping Use: Never used  Substance and Sexual Activity  . Alcohol use: Yes    Comment: occ beer   . Drug use: Never  . Sexual activity: Not Currently  Other Topics Concern  . Not on file  Social History Narrative  . Not on file   Social Determinants of Health   Financial Resource Strain:   . Difficulty of Paying Living Expenses:   Food Insecurity:   . Worried About Charity fundraiser in the Last Year:   . Arboriculturist in the Last Year:   Transportation Needs:   . Film/video editor (Medical):   Marland Kitchen Lack of Transportation (Non-Medical):   Physical Activity:   . Days of Exercise per Week:   . Minutes of Exercise per Session:   Stress:   . Feeling of Stress :   Social Connections:   . Frequency of Communication with Friends and Family:   . Frequency of Social Gatherings with Friends and Family:   . Attends Religious Services:   . Active Member of Clubs or Organizations:   . Attends Archivist Meetings:   Marland Kitchen Marital Status:      Review of Systems: A 12 point ROS discussed and pertinent positives are indicated in the HPI above.  All other systems are negative.  Review of Systems  Constitutional: Negative for chills and fever.  Respiratory: Negative for shortness of breath and wheezing.   Cardiovascular: Negative for chest pain and palpitations.  Gastrointestinal: Negative for abdominal pain.  Neurological: Negative for headaches.  Psychiatric/Behavioral: Negative for behavioral problems and confusion.    Vital Signs: BP 135/83 (BP Location: Right Arm)   Pulse 82   Temp 98.2 F (36.8 C) (Oral)   Resp 18   SpO2 99%   Physical Exam Vitals and nursing note reviewed.  Constitutional:      General: He is not in acute distress.    Appearance: Normal appearance.  Cardiovascular:     Rate and  Rhythm: Normal rate and regular rhythm.     Heart sounds: Normal heart sounds. No murmur heard.   Pulmonary:     Effort: Pulmonary effort is normal. No respiratory distress.     Breath sounds: Normal breath sounds. No wheezing.  Skin:    General: Skin is warm and dry.  Neurological:     Mental Status: He is alert and oriented to person, place, and time.      MD Evaluation Airway: WNL Heart: WNL Abdomen: WNL Chest/ Lungs: WNL ASA  Classification: 3 Mallampati/Airway Score: Two   Imaging: No results found.  Labs:  CBC: Recent Labs    01/19/20 0730  WBC 8.5  HGB 14.6  HCT 42.9  PLT 247    COAGS: Recent Labs    01/19/20 0730  INR 1.0    BMP: Recent Labs    01/19/20 0730  NA 138  K 4.1  CL 103  CO2 23  GLUCOSE 115*  BUN 15  CALCIUM 9.3  CREATININE 1.57*  GFRNONAA 43*  GFRAA 50*     Assessment and Plan:  Right ischial bone lesion. Plan for image-guided right ischial bone lesions biopsy today in IR. Patient is NPO. Afebrile and WBCs WNL. He does not take blood thinners. INR 1.0 today.  Risks and benefits discussed with the patient including, but not limited to bleeding, infection, damage to adjacent structures or low yield requiring additional tests. All of the patient's questions were answered, patient is agreeable to proceed. Consent signed and in chart.   Thank you for this interesting consult.  I greatly enjoyed meeting James Massey and look forward to participating in their care.  A copy of this report was sent to the requesting provider on this date.  Electronically Signed: Earley Abide, PA-C 01/19/2020, 8:28 AM   I spent a total of 30 Minutes in face to face in clinical consultation, greater than 50% of which was counseling/coordinating care for right ischial bone lesion/biopsy.

## 2020-01-19 NOTE — Procedures (Signed)
Pre procedural Dx: Lytic lesion involving the right inferior pubic ramus Post procedural Dx: Same  Technically successful CT guided biopsy of lytic lesion involving the right inferior pubic ramus.   EBL: None.  Complications: None immediate.   Ronny Bacon, MD Pager #: [phone]

## 2020-01-23 LAB — SURGICAL PATHOLOGY

## 2020-01-24 ENCOUNTER — Telehealth: Payer: Self-pay | Admitting: Oncology

## 2020-01-24 NOTE — Telephone Encounter (Signed)
Scheduled per 07/14 los, patient has been called and notified. 

## 2020-01-30 NOTE — Progress Notes (Signed)
Histology and Location of Primary Cancer: renal cell carcinoma s/p right radical nephrectomy on 05/31/2018  Sites of Visceral and Bony Metastatic Disease: Left lower lobe of posterior lung and right inferior pubic ramus  Location(s) of Symptomatic Metastases: Right inferior pubic ramus. Patient denies pulmonary complaints.  Past/Anticipated chemotherapy by medical oncology, if any: Consulted on 01/18/2020  Pain on a scale of 0-10 is: Right posterior hip pain. Reports constant pain 1 on a scale of 0-10 in his right hip.   If Spine Met(s), symptoms, if any, include:  Bowel/Bladder retention or incontinence (please describe): Denies any urologic complaints. Reports occasional constipation managed with colace.   Numbness or weakness in extremities (please describe): denies  Current Decadron regimen, if applicable: denies  Ambulatory status? Walker? Wheelchair?: Ambulatory with Assistance  SAFETY ISSUES:  Prior radiation? no  Pacemaker/ICD? no  Possible current pregnancy? no, male patient  Is the patient on methotrexate? No   Current Complaints / other details:  74 year old male. Married. Resides in [facility]

## 2020-01-31 ENCOUNTER — Encounter: Payer: Self-pay | Admitting: Urology

## 2020-01-31 ENCOUNTER — Encounter: Payer: Self-pay | Admitting: Radiation Oncology

## 2020-01-31 ENCOUNTER — Other Ambulatory Visit: Payer: Self-pay

## 2020-01-31 ENCOUNTER — Ambulatory Visit
Admission: RE | Admit: 2020-01-31 | Discharge: 2020-01-31 | Disposition: A | Discharge status: Home / Self Care | Payer: Medicare Other | Source: Ambulatory Visit | Attending: Radiation Oncology | Admitting: Radiation Oncology

## 2020-01-31 VITALS — BP 129/77 | HR 89 | Temp 98.1°F | Resp 20 | Ht 69.0 in | Wt 208.8 lb

## 2020-01-31 DIAGNOSIS — Z87891 Personal history of nicotine dependence: Secondary | ICD-10-CM | POA: Diagnosis not present

## 2020-01-31 DIAGNOSIS — C7951 Secondary malignant neoplasm of bone: Secondary | ICD-10-CM

## 2020-01-31 DIAGNOSIS — R011 Cardiac murmur, unspecified: Secondary | ICD-10-CM | POA: Diagnosis not present

## 2020-01-31 DIAGNOSIS — Z7982 Long term (current) use of aspirin: Secondary | ICD-10-CM | POA: Diagnosis not present

## 2020-01-31 DIAGNOSIS — Z803 Family history of malignant neoplasm of breast: Secondary | ICD-10-CM | POA: Diagnosis not present

## 2020-01-31 DIAGNOSIS — E782 Mixed hyperlipidemia: Secondary | ICD-10-CM | POA: Diagnosis not present

## 2020-01-31 DIAGNOSIS — Z79899 Other long term (current) drug therapy: Secondary | ICD-10-CM | POA: Diagnosis not present

## 2020-01-31 DIAGNOSIS — M109 Gout, unspecified: Secondary | ICD-10-CM | POA: Diagnosis not present

## 2020-01-31 DIAGNOSIS — C641 Malignant neoplasm of right kidney, except renal pelvis: Secondary | ICD-10-CM | POA: Diagnosis not present

## 2020-01-31 DIAGNOSIS — F419 Anxiety disorder, unspecified: Secondary | ICD-10-CM | POA: Insufficient documentation

## 2020-01-31 DIAGNOSIS — K2270 Barrett's esophagus without dysplasia: Secondary | ICD-10-CM | POA: Diagnosis not present

## 2020-01-31 DIAGNOSIS — Z8719 Personal history of other diseases of the digestive system: Secondary | ICD-10-CM | POA: Diagnosis not present

## 2020-01-31 DIAGNOSIS — C7802 Secondary malignant neoplasm of left lung: Secondary | ICD-10-CM | POA: Diagnosis not present

## 2020-01-31 DIAGNOSIS — C649 Malignant neoplasm of unspecified kidney, except renal pelvis: Secondary | ICD-10-CM | POA: Insufficient documentation

## 2020-01-31 DIAGNOSIS — K219 Gastro-esophageal reflux disease without esophagitis: Secondary | ICD-10-CM | POA: Diagnosis not present

## 2020-01-31 HISTORY — DX: Malignant neoplasm of right kidney, except renal pelvis: C64.1

## 2020-01-31 NOTE — Progress Notes (Signed)
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    ICD-10-CM   1. Secondary malignant neoplasm of bone and bone marrow (HCC)  C79.51    C79.52   2. 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 male seen at the request of Dr. Alen Blew. He was initially diagnosed with renal cell carcinoma in 2019 after presenting with gross hematuria. He was treated with right radical nephrectomy on 05/31/2018 under the care of Dr. [name]. Final surgical pathology showed a stage T3a clear cell renal carcinoma with tumor invasion into the renal sinus fatty tissue, grade 3 with negative surgical margins. He has remained in observation since that time with restaging scans showing no evidence of disease recurrence or metastasis.  However, more recently, he underwent surveillance CT C/A/P on 12/27/2019 showing a new 6.8 cm large expansile mass in the right inferior pubic ramus with a smaller lytic lesion in the right acetabular roof as well as 10 mm nodule in the LLL, concerning for metastases and additional tiny peripheral subpleural pulmonary nodules in the posterior RUL and RLL. He has had some persistent posterior right hip pain that is worse with sitting and pain in the right groin that is worse with weightbearing and activities.  He has been seeing an orthopedist and has had a cortisone injection that only provided relief for approximately 2 days before the pain returned and has since  persisted.  He denies shortness of breath, wheezing, chest pain, productive cough or hemoptysis.  CT-guided core biopsy of the pubic ramus mass was performed on 01/19/2020 with final surgical pathology confirming metastatic clear cell renal cell carcinoma, grade 2.  He met in consultation with Dr. Alen Blew on 01/18/2020 to discuss potential systemic treatment options.  His case was also presented at the recent multidisciplinary urologic oncology conference where consensus was to proceed with stereotactic radiation to the oligometastatic disease in the right hip and left lower lobe lung, possibly in combination with immunotherapy.  He has a scheduled follow-up visit with Dr. Alen Blew on 02/06/2020 to further discuss systemic treatment.  We have been asked to meet with him today to discuss the potential radiation treatment options  PREVIOUS RADIATION THERAPY: No  PAST MEDICAL HISTORY:  Past Medical History:  Diagnosis Date  . Anxiety   . Barrett esophagus   . GERD (gastroesophageal reflux disease)   . Gout   . Heart murmur   . History of anemia   . Kidney cancer, primary, 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 to other site [facility])   . Mixed hyperlipidemia   . Renal cell cancer, right ([place])   . Right renal mass   . Wears glasses       PAST SURGICAL HISTORY: Past Surgical History:  Procedure Laterality Date  . CYSTOSCOPY WITH RETROGRADE PYELOGRAM, URETEROSCOPY AND STENT PLACEMENT Right 05/21/2018   Procedure: RIGHT RETROGRADE PYELOGRAM, RIGHT DIAGNOSTIC URETEROSCOPY AND STENT PLACEMENT;  Surgeon: Ardis Hughs, MD;  Location[facility];  Service: Urology;  Laterality: Right;  . INGUINAL HERNIA REPAIR Bilateral 09/2012  . LAPAROSCOPIC NEPHRECTOMY, HAND ASSISTED Right 05/31/2018   Procedure: LAPAROSCOPIC RADICAL RIGHT NEPHRECTOMY;  Surgeon: Ardis Hughs, MD;  Location: [facility];  Service: Urology;  Laterality: Right;    FAMILY HISTORY:  Family History  Problem Relation Age of Onset  . Breast cancer  Mother   . Prostate cancer Neg Hx   . Pancreatic cancer Neg Hx   . Colon cancer Neg Hx     SOCIAL HISTORY:  Social History   Socioeconomic History  . Marital status: Married    Spouse name: Not on file  . Number of children: Not on file  . Years of education: Not on file  . Highest education level: Not on file  Occupational History  . Not on file  Tobacco Use  . Smoking status: Former Smoker    Packs/day: 0.50    Years: 15.00    Pack years: 7.50    Types: Cigarettes    Quit date: 12/12/1987    Years since quitting: 32.1  . Smokeless tobacco: Never Used  Vaping Use  . Vaping Use: Never used  Substance and Sexual Activity  . Alcohol use: Yes    Comment: occ beer   . Drug use: Never  . Sexual activity: Not Currently  Other Topics Concern  . Not on file  Social History Narrative  . Not on file   Social Determinants of Health   Financial Resource Strain:   . Difficulty of Paying Living Expenses:   Food Insecurity:   . Worried About Charity fundraiser in the Last Year:   . Arboriculturist in the Last Year:   Transportation Needs:   . Film/video editor (Medical):   Marland Kitchen Lack of Transportation (Non-Medical):   Physical Activity:   . Days of Exercise per Week:   . Minutes of Exercise per Session:   Stress:   . Feeling of Stress :   Social Connections:   . Frequency of Communication with Friends and Family:   . Frequency of Social Gatherings with Friends and Family:   . Attends Religious Services:   . Active Member of Clubs or Organizations:   . Attends Archivist Meetings:   Marland Kitchen Marital Status:   Intimate Partner Violence:   . Fear of Current or Ex-Partner:   . Emotionally Abused:   Marland Kitchen Physically Abused:   . Sexually Abused:     ALLERGIES: Bee venom, Budesonide-formoterol fumarate, Poison oak extract, and Clindamycin/lincomycin  MEDICATIONS:  Current Outpatient Medications  Medication Sig Dispense Refill  . allopurinol (ZYLOPRIM) 300 MG tablet  Take 300 mg by mouth at bedtime.     Marland Kitchen aspirin EC 81 MG tablet Take 81 mg by mouth at bedtime.     Marland Kitchen atorvastatin (LIPITOR) 40 MG tablet Take 40 mg by mouth at bedtime.     . Cholecalciferol 25 MCG (1000 UT) tablet Take by mouth.    . colchicine 0.6 MG tablet Take 2 tabs once followed by 1 tab an hour later for gout flare (total 1.8 mg dose/course). Do not repeat for at least 3 days.    . diazepam (VALIUM) 5 MG tablet Take 2.5-5 mg by mouth every 12 (twelve) hours as needed (for eye twitching).     . hydrocortisone 2.5 % cream Apply 1 application topically 2 (two) times daily as needed (for hemorroidal flare ups).    . magnesium 30  MG tablet Take 30 mg by mouth 2 (two) times daily.    . pantoprazole (PROTONIX) 40 MG tablet Take 40 mg by mouth 2 (two) times daily.    . pseudoephedrine-acetaminophen (TYLENOL SINUS) 30-500 MG TABS tablet Take 1-2 tablets by mouth every 4 (four) hours as needed (for sinus issues).    . Simethicone 180 MG CAPS Take 180 mg by mouth 3 (three) times daily as needed (for gas/indigestion.).    Marland Kitchen traMADol (ULTRAM) 50 MG tablet Take 1 tablet (50 mg total) by mouth every 8 (eight) hours as needed. 15 tablet 0   No current facility-administered medications for this encounter.    REVIEW OF SYSTEMS:  On review of systems, the patient reports that he is doing well overall. He denies any chest pain, shortness of breath, productive cough, hemoptysis, fevers, chills, night sweats, or recent unintended weight changes. He denies any bladder disturbances, and denies abdominal pain, nausea or vomiting. He reports occasional constipation managed with colace and constant pain in his right hip rated 1/10 but exacerbated with sitting for an extended period of time and with weightbearing and activities.  He denies any paresthesias or focal weakness in the lower extremities.  A complete review of systems is obtained and is otherwise negative.    PHYSICAL EXAM:  Wt Readings from Last 3  Encounters:  01/31/20 (!) 208 lb 12.8 oz (94.7 kg)  01/18/20 208 lb 1.6 oz (94.4 kg)  06/19/19 220 lb (99.8 kg)   Temp Readings from Last 3 Encounters:  01/31/20 98.1 F (36.7 C) (Oral)  01/19/20 98.3 F (36.8 C) (Oral)  01/18/20 98.1 F (36.7 C) (Temporal)   BP Readings from Last 3 Encounters:  01/31/20 (!) 129/77  01/19/20 114/81  01/18/20 138/83   Pulse Readings from Last 3 Encounters:  01/31/20 89  01/19/20 74  01/18/20 92   Pain Assessment Pain Score: 1  Pain Frequency: Constant Pain Loc: Hip/10  In general this is a well appearing Caucasian male in no acute distress.  He is alert and oriented x4 and appropriate throughout the examination. HEENT reveals that the patient is normocephalic, atraumatic. EOMs are intact. PERRLA. Skin is intact without any evidence of gross lesions. Cardiopulmonary assessment is negative for acute distress and he exhibits normal effort. Lower extremities are negative for pretibial pitting edema, deep calf tenderness, cyanosis or clubbing.   KPS = 100  100 - Normal; no complaints; no evidence of disease. 90   - Able to carry on normal activity; minor signs or symptoms of disease. 80   - Normal activity with effort; some signs or symptoms of disease. 29   - Cares for self; unable to carry on normal activity or to do active work. 60   - Requires occasional assistance, but is able to care for most of his personal needs. 50   - Requires considerable assistance and frequent medical care. 52   - Disabled; requires special care and assistance. 59   - Severely disabled; hospital admission is indicated although death not imminent. 87   - Very sick; hospital admission necessary; active supportive treatment necessary. 10   - Moribund; fatal processes progressing rapidly. 0     - Dead  Karnofsky DA, Abelmann WH, Craver LS and Burchenal [facility] [phone]) The use of the nitrogen mustards in the palliative treatment of carcinoma: with particular reference to  bronchogenic carcinoma Cancer 1 634-56  LABORATORY DATA:  Lab Results  Component Value Date   WBC 8.5 01/19/2020   HGB  14.6 01/19/2020   HCT 42.9 01/19/2020   MCV 94.5 01/19/2020   PLT 247 01/19/2020   Lab Results  Component Value Date   NA 138 01/19/2020   K 4.1 01/19/2020   CL 103 01/19/2020   CO2 23 01/19/2020   Lab Results  Component Value Date   ALT 28 05/26/2018   AST 25 05/26/2018   ALKPHOS 61 05/26/2018   BILITOT 1.1 05/26/2018     RADIOGRAPHY: CT BIOPSY  Result Date: 01/19/2020 INDICATION: History of renal cell carcinoma, now with lytic lesion involving the right inferior pubic ramus. Please perform CT-guided biopsy for tissue diagnostic purposes. EXAM: CT GUIDED BIOPSY OF LYTIC LESION INVOLVING THE RIGHT INFERIOR PUBIC RAMUS COMPARISON:  CT of the chest, abdomen and pelvis-12/27/2019 MEDICATIONS: None. ANESTHESIA/SEDATION: Fentanyl 100 mcg IV; Versed 2 mg IV Sedation time: 19 minutes; The patient was continuously monitored during the procedure by the interventional radiology nurse under my direct supervision. CONTRAST:  None. COMPLICATIONS: None immediate. PROCEDURE: Informed consent was obtained from the patient following an explanation of the procedure, risks, benefits and alternatives. A time out was performed prior to the initiation of the procedure. The patient was positioned prone on the CT table and a limited CT was performed for procedural planning demonstrating unchanged size and appearance of the approximately 5.6 x 5.2 cm lytic expansile lesion involving the right inferior pubic ramus (image 22, series 3). The procedure was planned. The operative site was prepped and draped in the usual sterile fashion. Appropriate trajectory was confirmed with a 22 gauge spinal needle after the adjacent tissues were anesthetized with 1% Lidocaine with epinephrine. Under intermittent CT guidance, a 17 gauge coaxial needle was advanced into the peripheral aspect of the mass.  Appropriate positioning was confirmed and 6 core needle biopsy samples were obtained with an 18 gauge core needle biopsy device. The co-axial needle was removed following administration of a Gel-Foam slurry and superficial hemostasis was achieved with manual compression. A limited postprocedural CT was negative for hemorrhage or additional complication. A dressing was placed. The patient tolerated the procedure well without immediate postprocedural complication. IMPRESSION: Technically successful CT guided core needle biopsy of lytic, expansile lesion involving the right inferior pubic ramus. Electronically Signed   By: Sandi Mariscal M.D.   On: 01/19/2020 10:55      IMPRESSION/PLAN: 1. 74 y.o. 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 Today, we talked to the patient and his wife about the findings and workup thus far. We reviewed his imaging and discussed the natural history of renal cell carcinoma and general treatment, highlighting the role of radiotherapy in the management of oligometastatic disease. We discussed the available radiation techniques, and focused on the details and logistics of delivery.  The recommendation is to proceed with a 5 fraction course of stereotactic body radiation therapy focused on the lesion in the right inferior pubic ramus, right acetabular roof and left lower lobe lung nodule.  We reviewed the anticipated acute and late sequelae associated with radiation in this setting. The patient was encouraged to ask questions that were answered to his stated satisfaction.  At the end of our conversation, the patient would like to proceed with the recommended 5 fraction course of stereotactic body radiotherapy focused on the lesion in the right inferior pubic ramus, right acetabular roof and left lower lobe lung nodule.  He appears to have a good understanding of his disease and our treatment recommendations  which  are of curative intent.  He understands that there is a very low likelihood of cure but that this treatment will certainly improve the probability of gaining more long-term, durable control of his disease, improving overall survival.  He has freely signed written consent to proceed today in the office and a copy of this document will be placed in his medical record.  He is scheduled for CT simulation/treatment planning on Wednesday, 02/08/2020 at 9:30 AM with plans to begin his treatments in the near future.  We will share our discussion with Dr. Alen Blew and Dr. [name] and he will keep his planned follow-up visit with Dr. Alen Blew on 02/06/2020 to further discuss the recommendation and timing of systemic therapy.  We enjoyed meeting him and his wife today and look forward to continuing to participate in his care.    Nicholos Johns, PA-C    Tyler Pita, MD  [facility] Direct Dial: [phone]  Fax: [phone] [email or web address]  Skype  [email or web address]   This document serves as a record of services personally performed by Tyler Pita, MD and Freeman Caldron, PA-C. It was created on their behalf by Wilburn Mylar, a trained medical scribe. The creation of this record is based on the scribe's personal observations and the provider's statements to them. This document has been checked and approved by the attending provider.

## 2020-02-06 ENCOUNTER — Inpatient Hospital Stay (HOSPITAL_BASED_OUTPATIENT_CLINIC_OR_DEPARTMENT_OTHER): Payer: Medicare Other | Admitting: Oncology

## 2020-02-06 ENCOUNTER — Inpatient Hospital Stay: Payer: Medicare Other | Attending: Oncology

## 2020-02-06 ENCOUNTER — Other Ambulatory Visit: Payer: Self-pay

## 2020-02-06 VITALS — BP 142/80 | HR 88 | Temp 97.5°F | Resp 19 | Ht 69.0 in | Wt 210.1 lb

## 2020-02-06 DIAGNOSIS — Z923 Personal history of irradiation: Secondary | ICD-10-CM | POA: Diagnosis not present

## 2020-02-06 DIAGNOSIS — C649 Malignant neoplasm of unspecified kidney, except renal pelvis: Secondary | ICD-10-CM

## 2020-02-06 DIAGNOSIS — E032 Hypothyroidism due to medicaments and other exogenous substances: Secondary | ICD-10-CM

## 2020-02-06 DIAGNOSIS — F419 Anxiety disorder, unspecified: Secondary | ICD-10-CM | POA: Diagnosis not present

## 2020-02-06 DIAGNOSIS — N2889 Other specified disorders of kidney and ureter: Secondary | ICD-10-CM | POA: Diagnosis not present

## 2020-02-06 DIAGNOSIS — C7951 Secondary malignant neoplasm of bone: Secondary | ICD-10-CM | POA: Insufficient documentation

## 2020-02-06 DIAGNOSIS — C641 Malignant neoplasm of right kidney, except renal pelvis: Secondary | ICD-10-CM | POA: Insufficient documentation

## 2020-02-06 DIAGNOSIS — R918 Other nonspecific abnormal finding of lung field: Secondary | ICD-10-CM | POA: Diagnosis not present

## 2020-02-06 DIAGNOSIS — Z79899 Other long term (current) drug therapy: Secondary | ICD-10-CM | POA: Insufficient documentation

## 2020-02-06 LAB — CBC WITH DIFFERENTIAL (CANCER CENTER ONLY)
Abs Immature Granulocytes: 0.04 10*3/uL (ref 0.00–0.07)
Basophils Absolute: 0.1 10*3/uL (ref 0.0–0.1)
Basophils Relative: 1 %
Eosinophils Absolute: 0.2 10*3/uL (ref 0.0–0.5)
Eosinophils Relative: 3 %
HCT: 39.7 % (ref 39.0–52.0)
Hemoglobin: 13.7 g/dL (ref 13.0–17.0)
Immature Granulocytes: 1 %
Lymphocytes Relative: 26 %
Lymphs Abs: 1.9 10*3/uL (ref 0.7–4.0)
MCH: 31.7 pg (ref 26.0–34.0)
MCHC: 34.5 g/dL (ref 30.0–36.0)
MCV: 91.9 fL (ref 80.0–100.0)
Monocytes Absolute: 0.7 10*3/uL (ref 0.1–1.0)
Monocytes Relative: 10 %
Neutro Abs: 4.4 10*3/uL (ref 1.7–7.7)
Neutrophils Relative %: 59 %
Platelet Count: 274 10*3/uL (ref 150–400)
RBC: 4.32 MIL/uL (ref 4.22–5.81)
RDW: 11.9 % (ref 11.5–15.5)
WBC Count: 7.3 10*3/uL (ref 4.0–10.5)
nRBC: 0 % (ref 0.0–0.2)

## 2020-02-06 LAB — CMP (CANCER CENTER ONLY)
ALT: 15 U/L (ref 0–44)
AST: 16 U/L (ref 15–41)
Albumin: 3.5 g/dL (ref 3.5–5.0)
Alkaline Phosphatase: 74 U/L (ref 38–126)
Anion gap: 9 (ref 5–15)
BUN: 15 mg/dL (ref 8–23)
CO2: 26 mmol/L (ref 22–32)
Calcium: 10.2 mg/dL (ref 8.9–10.3)
Chloride: 104 mmol/L (ref 98–111)
Creatinine: 1.61 mg/dL — ABNORMAL HIGH (ref 0.61–1.24)
GFR, Est AFR Am: 48 mL/min — ABNORMAL LOW (ref >60)
GFR, Estimated: 42 mL/min — ABNORMAL LOW (ref >60)
Glucose, Bld: 113 mg/dL — ABNORMAL HIGH (ref 70–99)
Potassium: 4.3 mmol/L (ref 3.5–5.1)
Sodium: 139 mmol/L (ref 135–145)
Total Bilirubin: 0.6 mg/dL (ref 0.3–1.2)
Total Protein: 6.8 g/dL (ref 6.5–8.1)

## 2020-02-06 LAB — TSH: TSH: 3.648 u[IU]/mL (ref 0.320–4.118)

## 2020-02-06 NOTE — Progress Notes (Signed)
Hematology and Oncology Follow Up Visit  James Massey [id number] 1946/04/01 74 y.o. 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  He was initially diagnosed with T3AN0 localized right kidney cancer.   Prior Therapy:  He is status post laparoscopic right radical nephrectomy on June 01, 2019 2019.  Current therapy: Under evaluation to be treated with radiation therapy for his oligometastatic disease.  Interim History: [name] returns today for a follow-up visit.  Since the last visit, he underwent a successful CT-guided biopsy of the lytic lesion in the right inferior pubic ramus which confirmed the presence of clear-cell renal cell carcinoma.  He was also evaluated by Dr. Tammi Klippel for potential radiation therapy for his oligometastatic disease.  Clinically, he reports no increased pain at this time is able to ambulate without any difficulties.  He does use a cane for precautionary reasons.  He did report some occasional insomnia and anxiety related to his diagnosis but no other complaints.    Medications: I have reviewed the patient's current medications.  Current Outpatient Medications  Medication Sig Dispense Refill  . allopurinol (ZYLOPRIM) 300 MG tablet Take 300 mg by mouth at bedtime.     Marland Kitchen aspirin EC 81 MG tablet Take 81 mg by mouth at bedtime.     Marland Kitchen atorvastatin (LIPITOR) 40 MG tablet Take 40 mg by mouth at bedtime.     . Cholecalciferol 25 MCG (1000 UT) tablet Take by mouth.    . colchicine 0.6 MG tablet Take 2 tabs once followed by 1 tab an hour later for gout flare (total 1.8 mg dose/course). Do not repeat for at least 3 days.    . diazepam (VALIUM) 5 MG tablet Take 2.5-5 mg by mouth every 12 (twelve) hours as needed (for eye twitching).     . hydrocortisone 2.5 % cream Apply 1 application topically 2 (two) times  daily as needed (for hemorroidal flare ups).    . magnesium 30 MG tablet Take 30 mg by mouth 2 (two) times daily.    . pantoprazole (PROTONIX) 40 MG tablet Take 40 mg by mouth 2 (two) times daily.    . pseudoephedrine-acetaminophen (TYLENOL SINUS) 30-500 MG TABS tablet Take 1-2 tablets by mouth every 4 (four) hours as needed (for sinus issues).    . Simethicone 180 MG CAPS Take 180 mg by mouth 3 (three) times daily as needed (for gas/indigestion.).    Marland Kitchen traMADol (ULTRAM) 50 MG tablet Take 1 tablet (50 mg total) by mouth every 8 (eight) hours as needed. 15 tablet 0   No current facility-administered medications for this visit.     Allergies:  Allergies  Allergen Reactions  . Bee Venom Swelling  . Budesonide-Formoterol Fumarate Other (See Comments)    RESPIRATORY ISSUES  . Poison [employer] and ivy  . Clindamycin/Lincomycin Rash     Physical Exam: Blood pressure (!) 142/80, pulse 88, temperature (!) 97.5 F (36.4 C), temperature source Temporal, resp. rate 19, height 5\' 9"  (1.753 m), weight (!) 210 lb 1.6 oz (95.3 kg), SpO2 99 %. ECOG: 1  General appearance: Comfortable appearing without any discomfort Head: Normocephalic without any trauma Oropharynx: Mucous membranes are moist and pink without any thrush or ulcers. Eyes: Pupils are equal and round reactive to light. Lymph nodes: No cervical, supraclavicular, inguinal or axillary lymphadenopathy.  Heart:regular rate and rhythm.  S1 and S2 without leg edema. Lung: Clear without any rhonchi or wheezes.  No dullness to percussion. Abdomin: Soft, nontender, nondistended with good bowel sounds.  No hepatosplenomegaly. Musculoskeletal: No joint deformity or effusion.  Full range of motion noted. Neurological: No deficits noted on motor, sensory and deep tendon reflex exam. Skin: No petechial rash or dryness.  Appeared moist.  Psychiatric: Mood and affect appeared appropriate.     Lab Results: Lab Results   Component Value Date   WBC 7.3 02/06/2020   HGB 13.7 02/06/2020   HCT 39.7 02/06/2020   MCV 91.9 02/06/2020   PLT 274 02/06/2020     Chemistry      Component Value Date/Time   NA 139 02/06/2020 0928   K 4.3 02/06/2020 0928   CL 104 02/06/2020 0928   CO2 26 02/06/2020 0928   BUN 15 02/06/2020 0928   CREATININE 1.61 (H) 02/06/2020 0928      Component Value Date/Time   CALCIUM 10.2 02/06/2020 0928   ALKPHOS 74 02/06/2020 0928   AST 16 02/06/2020 0928   ALT 15 02/06/2020 0928   BILITOT 0.6 02/06/2020 0928      Impression and Plan:   74 year old man with:  1.    Stage IV clear-cell renal cell carcinoma with right pubic ramus involvement documented in July 2021.  He was initially diagnosed with T3a N0 right-sided renal cell carcinoma.  The natural course of this disease was reviewed again today with the patient and his wife.  Treatment options were reiterated.  Local therapy to the isolated area of metastasis would be one approach versus a systemic chemotherapy utilizing immunotherapy were also reviewed.  Concomitant therapy with stereotactic radiosurgery or immunotherapy option was also reviewed.  After discussion today, we have opted to treat him locally and repeat imaging studies in the next 2 to 3 months.  Based on these results we will determine the need for full systemic therapy.  She has more evidence of systemic disease I prefer to proceed with combination of immunotherapy or combination of immunotherapy with oral targeted therapy at that time.  2.  Prognosis and goals of care: His disease is unlikely to be curable but can be treated and palliated for an extended period of time.  Present measures are warranted given his excellent performance status.  3.  Anxiety: He does use Valium periodically which helps with his mood.  I recommended evaluation by his primary care physician for possible antidepressant medication if this issue persists.  4. Follow up: He will return  in 2 to 3 months for repeat imaging studies and reevaluation.   30  minutes were spent on this encounter.  The time was dedicated to reviewing his disease status, discussing treatment options reviewing pathology results and future plan of care review.   [name], MD 8/2/202110:19 AM

## 2020-02-08 ENCOUNTER — Other Ambulatory Visit: Payer: Self-pay

## 2020-02-08 ENCOUNTER — Ambulatory Visit
Admission: RE | Admit: 2020-02-08 | Discharge: 2020-02-08 | Disposition: A | Discharge status: Home / Self Care | Payer: Medicare Other | Source: Ambulatory Visit | Attending: Radiation Oncology | Admitting: Radiation Oncology

## 2020-02-08 DIAGNOSIS — C7951 Secondary malignant neoplasm of bone: Secondary | ICD-10-CM | POA: Diagnosis not present

## 2020-02-08 DIAGNOSIS — Z510 Encounter for antineoplastic radiation therapy: Secondary | ICD-10-CM | POA: Diagnosis present

## 2020-02-08 DIAGNOSIS — C7802 Secondary malignant neoplasm of left lung: Secondary | ICD-10-CM | POA: Diagnosis not present

## 2020-02-08 DIAGNOSIS — C649 Malignant neoplasm of unspecified kidney, except renal pelvis: Secondary | ICD-10-CM

## 2020-02-14 ENCOUNTER — Telehealth: Payer: Self-pay | Admitting: Oncology

## 2020-02-14 NOTE — Telephone Encounter (Signed)
Scheduled per 08/02 los, patient has been called and notified.

## 2020-02-14 NOTE — Progress Notes (Signed)
  Radiation Oncology         [phone]) [phone] ________________________________  Name: James Massey MRN: [id number]  Date: 02/08/2020  DOB: February 23, 1946  STEREOTACTIC BODY RADIOTHERAPY SIMULATION AND TREATMENT PLANNING NOTE    ICD-10-CM   1. 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  NARRATIVE:  The patient was brought to the [facility].  Identity was confirmed.  All relevant records and images related to the planned course of therapy were reviewed.  The patient freely provided informed written consent to proceed with treatment after reviewing the details related to the planned course of therapy. The consent form was witnessed and verified by the simulation staff.  Then, the patient was set-up in a stable reproducible  supine position for radiation therapy.  A BodyFix immobilization pillow was fabricated for reproducible positioning.  Surface markings were placed.  The CT images were loaded into the planning software.  The gross target volumes (GTV) and planning target volumes (PTV) were delinieated, and avoidance structures were contoured.  Treatment planning then occurred.  The radiation prescription was entered and confirmed.  A total of two complex treatment devices were fabricated in the form of the BodyFix immobilization pillow and a neck accuform cushion.  I have requested : 3D Simulation  I have requested a DVH of the following structures: targets and all normal structures near the target including lungs, heart, esophagus, bowel and bladder as noted on the radiation plan to maintain doses in adherence with established limits  SPECIAL TREATMENT PROCEDURE:  The planned course of therapy using radiation constitutes a special treatment procedure.  Special care is required in the management of this patient for the following reasons. High dose per fraction requiring special monitoring for increased toxicities of treatment including daily imaging..  The special nature of the planned course of radiotherapy will require increased physician supervision and oversight to ensure patient's safety with optimal treatment outcomes.  PLAN:  The patient will receive 50 Gy in 5 fractions to the lung and bone metastasis.  ________________________________  Sheral Apley Tammi Klippel, M.D.

## 2020-02-15 DIAGNOSIS — Z510 Encounter for antineoplastic radiation therapy: Secondary | ICD-10-CM | POA: Diagnosis not present

## 2020-02-20 ENCOUNTER — Other Ambulatory Visit: Payer: Self-pay

## 2020-02-20 ENCOUNTER — Ambulatory Visit
Admission: RE | Admit: 2020-02-20 | Discharge: 2020-02-20 | Disposition: A | Discharge status: Home / Self Care | Payer: Medicare Other | Source: Ambulatory Visit | Attending: Radiation Oncology | Admitting: Radiation Oncology

## 2020-02-20 DIAGNOSIS — C649 Malignant neoplasm of unspecified kidney, except renal pelvis: Secondary | ICD-10-CM

## 2020-02-20 DIAGNOSIS — Z510 Encounter for antineoplastic radiation therapy: Secondary | ICD-10-CM | POA: Diagnosis not present

## 2020-02-20 DIAGNOSIS — C7951 Secondary malignant neoplasm of bone: Secondary | ICD-10-CM

## 2020-02-21 ENCOUNTER — Ambulatory Visit: Payer: Medicare Other | Admitting: Radiation Oncology

## 2020-02-22 ENCOUNTER — Ambulatory Visit
Admission: RE | Admit: 2020-02-22 | Discharge: 2020-02-22 | Disposition: A | Discharge status: Home / Self Care | Payer: Medicare Other | Source: Ambulatory Visit | Attending: Radiation Oncology | Admitting: Radiation Oncology

## 2020-02-22 ENCOUNTER — Other Ambulatory Visit: Payer: Self-pay

## 2020-02-22 DIAGNOSIS — C649 Malignant neoplasm of unspecified kidney, except renal pelvis: Secondary | ICD-10-CM

## 2020-02-22 DIAGNOSIS — Z510 Encounter for antineoplastic radiation therapy: Secondary | ICD-10-CM | POA: Diagnosis not present

## 2020-02-22 DIAGNOSIS — C7802 Secondary malignant neoplasm of left lung: Secondary | ICD-10-CM

## 2020-02-23 ENCOUNTER — Ambulatory Visit: Payer: Medicare Other | Admitting: Radiation Oncology

## 2020-02-23 ENCOUNTER — Telehealth: Payer: Self-pay

## 2020-02-23 NOTE — Telephone Encounter (Signed)
Patient called and wanted to make Dr. Alen Blew aware that he has pain on his left side near the breastbone. He rates the pain from 3 up to 7 and says it has been going on 3 weeks or more. Patient states the pain comes and goes and he experienced this several years ago. He has taken Tylenol which does not help much. He tried to contact his PCP but she is out of town for 2 weeks. Patient wanted to make Dr. Alen Blew aware and states the pain increases when he coughs or sneezes. He has radiation tomorrow. Dr. Alen Blew made aware and per Dr. Alen Blew this is not related to the cancer and if it gets worse then patient needs to go to Urgent Care. Patient made aware and verbalized understanding.

## 2020-02-24 ENCOUNTER — Ambulatory Visit
Admission: RE | Admit: 2020-02-24 | Discharge: 2020-02-24 | Disposition: A | Discharge status: Home / Self Care | Payer: Medicare Other | Source: Ambulatory Visit | Attending: Radiation Oncology | Admitting: Radiation Oncology

## 2020-02-24 ENCOUNTER — Other Ambulatory Visit: Payer: Self-pay

## 2020-02-24 DIAGNOSIS — Z510 Encounter for antineoplastic radiation therapy: Secondary | ICD-10-CM | POA: Diagnosis not present

## 2020-02-24 DIAGNOSIS — C649 Malignant neoplasm of unspecified kidney, except renal pelvis: Secondary | ICD-10-CM

## 2020-02-28 ENCOUNTER — Other Ambulatory Visit: Payer: Self-pay

## 2020-02-28 ENCOUNTER — Ambulatory Visit
Admission: RE | Admit: 2020-02-28 | Discharge: 2020-02-28 | Disposition: A | Discharge status: Home / Self Care | Payer: Medicare Other | Source: Ambulatory Visit | Attending: Radiation Oncology | Admitting: Radiation Oncology

## 2020-02-28 DIAGNOSIS — C7802 Secondary malignant neoplasm of left lung: Secondary | ICD-10-CM

## 2020-02-28 DIAGNOSIS — C649 Malignant neoplasm of unspecified kidney, except renal pelvis: Secondary | ICD-10-CM

## 2020-02-28 DIAGNOSIS — Z510 Encounter for antineoplastic radiation therapy: Secondary | ICD-10-CM | POA: Diagnosis not present

## 2020-03-01 ENCOUNTER — Encounter: Payer: Self-pay | Admitting: Radiation Oncology

## 2020-03-01 ENCOUNTER — Other Ambulatory Visit: Payer: Self-pay

## 2020-03-01 ENCOUNTER — Telehealth: Payer: Self-pay | Admitting: Radiation Oncology

## 2020-03-01 ENCOUNTER — Ambulatory Visit
Admission: RE | Admit: 2020-03-01 | Discharge: 2020-03-01 | Disposition: A | Discharge status: Home / Self Care | Payer: Medicare Other | Source: Ambulatory Visit | Attending: Radiation Oncology | Admitting: Radiation Oncology

## 2020-03-01 VITALS — BP 129/83 | HR 88 | Temp 98.0°F | Resp 20 | Ht 69.0 in | Wt 206.4 lb

## 2020-03-01 DIAGNOSIS — Z510 Encounter for antineoplastic radiation therapy: Secondary | ICD-10-CM | POA: Diagnosis not present

## 2020-03-01 DIAGNOSIS — C649 Malignant neoplasm of unspecified kidney, except renal pelvis: Secondary | ICD-10-CM

## 2020-03-01 DIAGNOSIS — C7802 Secondary malignant neoplasm of left lung: Secondary | ICD-10-CM

## 2020-03-01 DIAGNOSIS — C7951 Secondary malignant neoplasm of bone: Secondary | ICD-10-CM

## 2020-03-01 NOTE — Telephone Encounter (Signed)
Received multiple messages from patient's wife within a ten minute time frame. Made several unsuccessful attempts to return her call at [phone]. Ultimately, reached the patient on his home [phone]. Patient explains he has been coughing since he strangled on tea this morning. Patient questions if he can present for his final radiation treatment at 1 pm. Patient denies fever, chills, loss of taste or smell. Patient otherwise asymptomatic. Encouraged patient to present for appointment. Encouraged patient to inform wife we have spoke and he committed to doing so. Patient expressed appreciation for the return call.

## 2020-03-01 NOTE — Progress Notes (Signed)
Weight and vitals stable. Reports intermittent tolerable pain to the left of his sternum which he correlates with arthritis. Denies a cough normally. Reports today since he got strangled on tea he has been coughing. Denies shortness of breath. Denies pain or difficulty associated with swallowing. Denies skin changes within treatment fields. Denies LUTS. Reports he manages constipation with Colace. Denies numbness or tingling of lower extremities. One month follow up appointment card given.   BP 129/83   Pulse 88   Temp 98 F (36.7 C)   Resp 20   Ht 5\' 9"  (1.753 m)   Wt 206 lb 6.4 oz (93.6 kg)   SpO2 99%   BMI 30.48 kg/m  Wt Readings from Last 3 Encounters:  03/01/20 206 lb 6.4 oz (93.6 kg)  02/06/20 (!) 210 lb 1.6 oz (95.3 kg)  01/31/20 (!) 208 lb 12.8 oz (94.7 kg)

## 2020-03-15 NOTE — Progress Notes (Signed)
Cardiology Office Note:   Date:  03/16/2020  NAME:  James Massey    MRN: [id number] DOB:  [birth year]   PCP:  Nicola Girt, DO  Cardiologist:  No primary care provider on file.   Referring MD: Nicola Girt, DO   Chief Complaint  Patient presents with  . Irregular Heart Beat   History of Present Illness:   James Massey is a 74 y.o. male with a hx of stage IV renal cell carcinoma who is being seen today for the evaluation of irregular heart beat at the request of Doug Sou B, DO.  He was evaluated by his primary care physician yesterday noted to be in atrial fibrillation.  I cannot see that EKG but I am able to see the report that was read by the cardiologist [facility].  He reports he was noted to have an irregular pulse and this led to the EKG.  He was recommended to take anticoagulation but he did not do this.  He was then referred to [place].  He presents today back in normal sinus rhythm with PACs.  He reports he is unaware of any palpitations.  He reports an occasional tightness in his chest associated with coughing.  Apparently when he does not cough he does not have chest pain.  He is recently undergone intense radiation therapy for recurrent renal cell carcinoma.  He has no history of hypertension.  There is a history of high cholesterol for which he takes medication.  He is never had a heart attack or stroke.  He is a former smoker.  He reports he is on exercise but has no limitations with his current level of activity.  He reports he has low energy due to recent radiation therapy for his recurrent renal cell carcinoma.  He denies any chest pain, shortness of breath or palpitations today.  No strong family history of heart disease.  He does report a lot of stress in his life.  He apparently is the executor of his cousins well.  Apparently this is a rather large estate that is having to be managed.  Problem List 1. 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  Paroxysmal atrial fibrillation -CHADSVASC = 1  Past Medical History: Past Medical History:  Diagnosis Date  . Anxiety   . Barrett esophagus   . GERD (gastroesophageal reflux disease)   . Gout   . Heart murmur   . History of anemia   . 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 Mixed hyperlipidemia   . Renal cell cancer, right ([place])   . Right renal mass   . Wears glasses     Past Surgical History: Past Surgical History:  Procedure Laterality Date  . CYSTOSCOPY WITH RETROGRADE PYELOGRAM, URETEROSCOPY AND STENT PLACEMENT Right 05/21/2018   Procedure: RIGHT RETROGRADE PYELOGRAM, RIGHT DIAGNOSTIC URETEROSCOPY AND STENT PLACEMENT;  Surgeon: Ardis Hughs, MD;  Location: [facility];  Service: Urology;  Laterality: Right;  . INGUINAL HERNIA REPAIR Bilateral 09/2012  . LAPAROSCOPIC NEPHRECTOMY, HAND ASSISTED Right 05/31/2018   Procedure: LAPAROSCOPIC RADICAL RIGHT NEPHRECTOMY;  Surgeon: Ardis Hughs, MD;  Location: [facility];  Service: Urology;  Laterality: Right;    Current Medications: Current Meds  Medication Sig  . allopurinol (ZYLOPRIM) 300 MG tablet Take 300 mg by mouth at bedtime.   Marland Kitchen aspirin EC 81 MG tablet Take 81 mg by mouth at bedtime.   Marland Kitchen atorvastatin (  LIPITOR) 40 MG tablet Take 40 mg by mouth at bedtime.   . Cholecalciferol 25 MCG (1000 UT) tablet Take by mouth.  . colchicine 0.6 MG tablet Take 2 tabs once followed by 1 tab an hour later for gout flare (total 1.8 mg dose/course). Do not repeat for at least 3 days.  . diazepam (VALIUM) 5 MG tablet Take 2.5-5 mg by mouth every 12 (twelve) hours as needed (for eye twitching).   . hydrocortisone 2.5 % cream Apply 1 application topically 2 (two) times daily as needed (for hemorroidal flare ups).  . magnesium 30 MG tablet Take 30 mg by mouth 2 (two) times daily.  . pantoprazole (PROTONIX) 40 MG tablet Take 40 mg by mouth 2 (two) times daily.  . pseudoephedrine-acetaminophen  (TYLENOL SINUS) 30-500 MG TABS tablet Take 1-2 tablets by mouth every 4 (four) hours as needed (for sinus issues).  . Simethicone 180 MG CAPS Take 180 mg by mouth 3 (three) times daily as needed (for gas/indigestion.).  Marland Kitchen traMADol (ULTRAM) 50 MG tablet Take 1 tablet (50 mg total) by mouth every 8 (eight) hours as needed.     Allergies:    Bee venom, Budesonide-formoterol fumarate, Poison oak extract, and Clindamycin/lincomycin   Social History: Social History   Socioeconomic History  . Marital status: Married    Spouse name: Not on file  . Number of children: Not on file  . Years of education: Not on file  . Highest education level: Not on file  Occupational History  . Occupation: retired Biochemist, clinical  Tobacco Use  . Smoking status: Former Smoker    Packs/day: 0.50    Years: 15.00    Pack years: 7.50    Types: Cigarettes    Quit date: 12/12/1987    Years since quitting: 32.2  . Smokeless tobacco: Never Used  Vaping Use  . Vaping Use: Never used  Substance and Sexual Activity  . Alcohol use: Yes    Comment: occ beer   . Drug use: Never  . Sexual activity: Not Currently  Other Topics Concern  . Not on file  Social History Narrative  . Not on file   Social Determinants of Health   Financial Resource Strain:   . Difficulty of Paying Living Expenses: Not on file  Food Insecurity:   . Worried About Charity fundraiser in the Last Year: Not on file  . Ran Out of Food in the Last Year: Not on file  Transportation Needs:   . Lack of Transportation (Medical): Not on file  . Lack of Transportation (Non-Medical): Not on file  Physical Activity:   . Days of Exercise per Week: Not on file  . Minutes of Exercise per Session: Not on file  Stress:   . Feeling of Stress : Not on file  Social Connections:   . Frequency of Communication with Friends and Family: Not on file  . Frequency of Social Gatherings with Friends and Family: Not on file  . Attends Religious Services: Not on  file  . Active Member of Clubs or Organizations: Not on file  . Attends Archivist Meetings: Not on file  . Marital Status: Not on file     Family History: The patient's family history includes Breast cancer in his mother; Heart attack in his father. There is no history of Prostate cancer, Pancreatic cancer, or Colon cancer.  ROS:   All other ROS reviewed and negative. Pertinent positives noted in the HPI.  EKGs/Labs/Other Studies Reviewed:   The following studies were personally reviewed by me today:  EKG:  EKG is ordered today.  The ekg ordered today demonstrates normal sinus rhythm, heart rate 88, PACs noted, and was personally reviewed by me.   Recent Labs: 02/06/2020: ALT 15; BUN 15; Creatinine 1.61; Hemoglobin 13.7; Platelet Count 274; Potassium 4.3; Sodium 139; TSH 3.648   Recent Lipid Panel No results found for: CHOL, TRIG, HDL, CHOLHDL, VLDL, LDLCALC, LDLDIRECT  Physical Exam:   VS:  BP (!) 134/92   Pulse 88   Ht 5\' 9"  (1.753 m)   Wt 207 lb (93.9 kg)   SpO2 97%   BMI 30.57 kg/m    Wt Readings from Last 3 Encounters:  03/16/20 207 lb (93.9 kg)  03/01/20 206 lb 6.4 oz (93.6 kg)  02/06/20 (!) 210 lb 1.6 oz (95.3 kg)    General: Well nourished, well developed, in no acute distress Heart: Atraumatic, normal size  Eyes: PEERLA, EOMI  Neck: Supple, no JVD Endocrine: No thryomegaly Cardiac: Normal S1, S2; RRR; no murmurs, rubs, or gallops Lungs: Clear to auscultation bilaterally, no wheezing, rhonchi or rales  Abd: Soft, nontender, no hepatomegaly  Ext: No edema, pulses 2+ Musculoskeletal: No deformities, BUE and BLE strength normal and equal Skin: Warm and dry, no rashes   Neuro: Alert and oriented to person, place, time, and situation, CNII-XII grossly intact, no focal deficits  Psych: Normal mood and affect   ASSESSMENT:   James Massey is a 74 y.o. male who presents for the following: 1. Paroxysmal A-fib ([place])   2. Irregular heart beat   3.  PAC (premature atrial contraction)   4. Precordial pain     PLAN:   1. Paroxysmal A-fib ([place]) 2. Irregular heart beat 3. PAC (premature atrial contraction) -Found to be in atrial fibrillation yesterday at his primary care physician office.  Back in normal sinus rhythm today.  He is unaware of any irregular heartbeats.  Most recent TSH 3.64.  No murmurs on exam today.  EKG today demonstrates normal sinus rhythm with PACs. -Given his lack of symptoms I have recommended a 7-day Zio patch to determine how much A. fib he is having.  Main risk factors for A. fib are advanced age and cancer. -CHADSVASC=1 (age 5-74).  Anticoagulation is optional at this point.  He reports he would like to hold on this.  He would like for me to talk with his oncologist regarding his bleeding risk.  Once he turns 75, anticoagulation will be recommended.  We can reach out to Dr. Alen Blew just to get his input on this.  I do not see this being a major issue for him. -He does need an echocardiogram.  We will obtain this. -I will see him back in 3 months.  He will let us know if he has any issues.  4. Precordial pain -Intermittent precordial chest pain related to coughing.  I suspect this is all related to his radiation treatment to his lungs.  He has no symptoms concerning for angina.  EKG without ischemic changes.  We will proceed with echo and Zio patch as above.  No need for stress test at this time.   Disposition: Return in about 3 months (around 06/15/2020).  Medication Adjustments/Labs and Tests Ordered: Current medicines are reviewed at length with the patient today.  Concerns regarding medicines are outlined above.  Orders Placed This Encounter  Procedures  . LONG TERM MONITOR (3-14 DAYS)  . EKG 12-Lead  .  ECHOCARDIOGRAM COMPLETE   No orders of the defined types were placed in this encounter.   Patient Instructions  Medication Instructions:  The current medical regimen is effective;  continue present plan  and medications.  *If you need a refill on your cardiac medications before your next appointment, please call your pharmacy*   Testing/Procedures: Echocardiogram - Your physician has requested that you have an echocardiogram. Echocardiography is a painless test that uses sound waves to create images of your heart. It provides your doctor with information about the size and shape of your heart and how well your heart's chambers and valves are working. This procedure takes approximately one hour. There are no restrictions for this procedure. This will be performed at our [facility] location - [address], [address].  Your physician has recommended that you wear a 7 DAY ZIO-PATCH monitor. The Zio patch cardiac monitor continuously records heart rhythm data for up to 14 days, this is for patients being evaluated for multiple types heart rhythms. For the first 24 hours post application, please avoid getting the Zio monitor wet in the shower or by excessive sweating during exercise. After that, feel free to carry on with regular activities. Keep soaps and lotions away from the ZIO XT Patch.  This will be mailed to you, please expect 7-10 days to receive.    Applying the monitor   Shave hair from upper left chest.   Hold abrader disc by orange tab.  Rub abrader in 40 strokes over left upper chest as indicated in your monitor instructions.   Clean area with 4 enclosed alcohol pads .  Use all pads to assure are is cleaned thoroughly.  Let dry.   Apply patch as indicated in monitor instructions.  Patch will be place under collarbone on left side of chest with arrow pointing upward.   Rub patch adhesive wings for 2 minutes.Remove white label marked "1".  Remove white label marked "2".  Rub patch adhesive wings for 2 additional minutes.   While looking in a mirror, press and release button in center of patch.  A small green light will flash 3-4 times .  This will be your only indicator the monitor has  been turned on.     Do not shower for the first 24 hours.  You may shower after the first 24 hours.   Press button if you feel a symptom. You will hear a small click.  Record Date, Time and Symptom in the Patient Log Book.   When you are ready to remove patch, follow instructions on last 2 pages of Patient Log Book.  Stick patch monitor onto last page of Patient Log Book.   Place Patient Log Book in [place] box.  Use locking tab on box and tape box closed securely.  The [employer] and [employer] has [employer] on it.  Please place in mailbox as soon as possible.  Your physician should have your test results approximately 7 days after the monitor has been mailed back to [facility].   Call [place] at [phone] if you have questions regarding your ZIO XT patch monitor.  Call them immediately if you see an orange light blinking on your monitor.   If your monitor falls off in less than 4 days contact our Monitor department at [phone].  If your monitor becomes loose or falls off after 4 days call [employer] at [phone] for suggestions on securing your monitor     Follow-Up: At  [facility], you and your health needs are our priority.  As part of our continuing mission to provide you with exceptional heart care, we have created designated Provider Care Teams.  These Care Teams include your primary Cardiologist (physician) and Advanced Practice Providers (APPs -  Physician Assistants and Nurse Practitioners) who all work together to provide you with the care you need, when you need it.  We recommend signing up for the patient portal called "MyChart".  Sign up information is provided on this After Visit Summary.  MyChart is used to connect with patients for Virtual Visits (Telemedicine).  Patients are able to view lab/test results, encounter notes, upcoming appointments, etc.  Non-urgent messages can be sent to your provider as well.   To learn more about what you can  do with MyChart, go to [email or web address].    Your next appointment:   3 month(s)  The format for your next appointment:   In Person  Provider:   Eleonore Chiquito, MD       Signed, Addison Naegeli. Audie Box, [place]  [address] [place], [place] [address] [phone]  03/16/2020 1:47 PM

## 2020-03-16 ENCOUNTER — Ambulatory Visit (INDEPENDENT_AMBULATORY_CARE_PROVIDER_SITE_OTHER): Payer: Medicare Other | Admitting: Cardiovascular Disease

## 2020-03-16 ENCOUNTER — Encounter: Payer: Self-pay | Admitting: Cardiovascular Disease

## 2020-03-16 ENCOUNTER — Other Ambulatory Visit: Payer: Self-pay

## 2020-03-16 ENCOUNTER — Telehealth: Payer: Self-pay

## 2020-03-16 VITALS — BP 134/92 | HR 88 | Ht 69.0 in | Wt 207.0 lb

## 2020-03-16 DIAGNOSIS — I491 Atrial premature depolarization: Secondary | ICD-10-CM

## 2020-03-16 DIAGNOSIS — I499 Cardiac arrhythmia, unspecified: Secondary | ICD-10-CM

## 2020-03-16 DIAGNOSIS — I480 Paroxysmal atrial fibrillation: Secondary | ICD-10-CM | POA: Diagnosis not present

## 2020-03-16 DIAGNOSIS — R072 Precordial pain: Secondary | ICD-10-CM

## 2020-03-16 NOTE — Patient Instructions (Signed)
Medication Instructions:  The current medical regimen is effective;  continue present plan and medications.  *If you need a refill on your cardiac medications before your next appointment, please call your pharmacy*   Testing/Procedures: Echocardiogram - Your physician has requested that you have an echocardiogram. Echocardiography is a painless test that uses sound waves to create images of your heart. It provides your doctor with information about the size and shape of your heart and how well your heart's chambers and valves are working. This procedure takes approximately one hour. There are no restrictions for this procedure. This will be performed at our [facility] location - [address].  Your physician has recommended that you wear a 7 DAY ZIO-PATCH monitor. The Zio patch cardiac monitor continuously records heart rhythm data for up to 14 days, this is for patients being evaluated for multiple types heart rhythms. For the first 24 hours post application, please avoid getting the Zio monitor wet in the shower or by excessive sweating during exercise. After that, feel free to carry on with regular activities. Keep soaps and lotions away from the ZIO XT Patch.  This will be mailed to you, please expect 7-10 days to receive.    Applying the monitor   Shave hair from upper left chest.   Hold abrader disc by orange tab.  Rub abrader in 40 strokes over left upper chest as indicated in your monitor instructions.   Clean area with 4 enclosed alcohol pads .  Use all pads to assure are is cleaned thoroughly.  Let dry.   Apply patch as indicated in monitor instructions.  Patch will be place under collarbone on left side of chest with arrow pointing upward.   Rub patch adhesive wings for 2 minutes.Remove white label marked "1".  Remove white label marked "2".  Rub patch adhesive wings for 2 additional minutes.   While looking in a mirror, press and release button in center of patch.   A small green light will flash 3-4 times .  This will be your only indicator the monitor has been turned on.     Do not shower for the first 24 hours.  You may shower after the first 24 hours.   Press button if you feel a symptom. You will hear a small click.  Record Date, Time and Symptom in the Patient Log Book.   When you are ready to remove patch, follow instructions on last 2 pages of Patient Log Book.  Stick patch monitor onto last page of Patient Log Book.   Place Patient Log Book in [place] box.  Use locking tab on box and tape box closed securely.  The [employer] has [employer] on it.  Please place in mailbox as soon as possible.  Your physician should have your test results approximately 7 days after the monitor has been mailed back to [facility].   Call [place] at [phone] if you have questions regarding your ZIO XT patch monitor.  Call them immediately if you see an orange light blinking on your monitor.   If your monitor falls off in less than 4 days contact our Monitor department at [phone].  If your monitor becomes loose or falls off after 4 days call [employer] at [phone] for suggestions on securing your monitor     Follow-Up: At [facility], you and your health needs are our priority.  As part of our continuing mission to provide you with  exceptional heart care, we have created designated Provider Care Teams.  These Care Teams include your primary Cardiologist (physician) and Advanced Practice Providers (APPs -  Physician Assistants and Nurse Practitioners) who all work together to provide you with the care you need, when you need it.  We recommend signing up for the patient portal called "MyChart".  Sign up information is provided on this After Visit Summary.  MyChart is used to connect with patients for Virtual Visits (Telemedicine).  Patients are able to view lab/test results, encounter notes, upcoming appointments, etc.   Non-urgent messages can be sent to your provider as well.   To learn more about what you can do with MyChart, go to [email or web address].    Your next appointment:   3 month(s)  The format for your next appointment:   In Person  Provider:   Eleonore Chiquito, MD

## 2020-03-16 NOTE — Telephone Encounter (Signed)
Enrolled patient for a 7 day Zio XT monitor to be mailed to patients home.  

## 2020-03-21 ENCOUNTER — Ambulatory Visit (INDEPENDENT_AMBULATORY_CARE_PROVIDER_SITE_OTHER): Payer: Medicare Other

## 2020-03-21 DIAGNOSIS — I480 Paroxysmal atrial fibrillation: Secondary | ICD-10-CM

## 2020-04-02 ENCOUNTER — Ambulatory Visit (HOSPITAL_COMMUNITY): Payer: Medicare Other | Attending: Internal Medicine

## 2020-04-02 ENCOUNTER — Other Ambulatory Visit: Payer: Self-pay

## 2020-04-02 DIAGNOSIS — I480 Paroxysmal atrial fibrillation: Secondary | ICD-10-CM | POA: Diagnosis present

## 2020-04-02 LAB — ECHOCARDIOGRAM COMPLETE
AR max vel: 2.28 cm2
AV Area VTI: 2.02 cm2
AV Area mean vel: 1.73 cm2
AV Mean grad: 8.5 mmHg
AV Peak grad: 16.2 mmHg
Ao pk vel: 2.01 m/s
Area-P 1/2: 4.49 cm2
S' Lateral: 2.9 cm

## 2020-04-05 ENCOUNTER — Other Ambulatory Visit: Payer: Self-pay

## 2020-04-05 ENCOUNTER — Ambulatory Visit
Admission: RE | Admit: 2020-04-05 | Discharge: 2020-04-05 | Disposition: A | Discharge status: Home / Self Care | Payer: Medicare Other | Source: Ambulatory Visit | Attending: Urology | Admitting: Urology

## 2020-04-05 ENCOUNTER — Encounter: Payer: Self-pay | Admitting: Urology

## 2020-04-05 DIAGNOSIS — C649 Malignant neoplasm of unspecified kidney, except renal pelvis: Secondary | ICD-10-CM

## 2020-04-05 DIAGNOSIS — C7802 Secondary malignant neoplasm of left lung: Secondary | ICD-10-CM

## 2020-04-05 NOTE — Progress Notes (Signed)
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  Interval Since Last Radiation:  5 weeks  02/20/20 - 03/01/20: The right hip and left lung targets were treated to 50 Gy in 5 fractions of 10 Gy each..  Narrative:  I spoke with the patient to conduct his routine scheduled 1 month follow up visit via telephone to spare the patient unnecessary potential exposure in the healthcare setting during the current COVID-19 pandemic.  The patient was notified in advance and gave permission to proceed with this visit format.  He tolerated his radiation treatments fairly well but did develop some moderate fatigue beginning with fraction #4.  Otherwise, he did not experience any ill side effects.                              On review of systems, the patient states that he is doing very well in general and is currently without complaints.  He reports that his energy is now back to approximately 95% of where he was prior to starting radiation.  He denies abdominal pain, nausea, vomiting, diarrhea or constipation.  Overall, he is quite pleased with his progress to date.  ALLERGIES:  is allergic to bee venom, budesonide-formoterol fumarate, poison oak extract, and clindamycin/lincomycin.  Meds: Current Outpatient Medications  Medication Sig Dispense Refill  . allopurinol (ZYLOPRIM) 300 MG tablet Take 300 mg by mouth at bedtime.     Marland Kitchen aspirin EC 81 MG tablet Take 81 mg by mouth at bedtime.     Marland Kitchen atorvastatin (LIPITOR) 40 MG tablet Take 40 mg by mouth at bedtime.     . Cholecalciferol 25 MCG (1000 UT) tablet Take by mouth.    . colchicine 0.6 MG tablet Take 2 tabs once followed by 1 tab an hour later  for gout flare (total 1.8 mg dose/course). Do not repeat for at least 3 days.    . diazepam (VALIUM) 5 MG tablet Take 2.5-5 mg by mouth every 12 (twelve) hours as needed (for eye twitching).     . hydrocortisone 2.5 % cream Apply 1 application topically 2 (two) times daily as needed (for hemorroidal flare ups).    . magnesium 30 MG tablet Take 30 mg by mouth 2 (two) times daily.    . pantoprazole (PROTONIX) 40 MG tablet Take 40 mg by mouth 2 (two) times daily.    . pseudoephedrine-acetaminophen (TYLENOL SINUS) 30-500 MG TABS tablet Take 1-2 tablets by mouth every 4 (four) hours as needed (for sinus issues).    . Simethicone 180 MG CAPS Take 180 mg by mouth 3 (three) times daily as needed (for gas/indigestion.).    Marland Kitchen traMADol (ULTRAM) 50 MG tablet Take 1 tablet (50 mg total) by mouth every 8 (eight) hours as needed. 15 tablet 0   No current facility-administered medications for this encounter.    Physical Findings:  vitals were not taken for this visit.   /10 Unable to assess due to telephone follow-up visit format.  Lab Findings: Lab Results  Component Value Date   WBC 7.3 02/06/2020   HGB 13.7 02/06/2020   HCT 39.7 02/06/2020  MCV 91.9 02/06/2020   PLT 274 02/06/2020     Radiographic Findings: ECHOCARDIOGRAM COMPLETE  Result Date: 04/02/2020    ECHOCARDIOGRAM REPORT   Patient Name:   James Massey Date of Exam: 04/02/2020 Medical Rec #:  [id number]         Height:       69.0 in Accession #:    [id number]        Weight:       207.0 lb Date of Birth:  [birth year]         BSA:          2.096 m Patient Age:    34 years          BP:           134/92 mmHg Patient Gender: M                 HR:           93 bpm. Exam Location:  [place] Procedure: 2D Echo, 3D Echo, Cardiac Doppler, Color Doppler and Strain Analysis Indications:    I48 Atrial fibrillation  History:        Patient has no prior history of Echocardiogram examinations.                 Signs/Symptoms:Murmur. Lung cancer.  Bone cancer. Renal cancer.                 Anemia.  Sonographer:    Jessee Avers, RDCS Referring Phys: [id number] [place]  1. Global longitudinal strain is -19.2%. Left ventricular ejection fraction, by estimation, is 70 to 75%. The left ventricle has hyperdynamic function. The left ventricle has no regional wall motion abnormalities. Left ventricular diastolic parameters were normal.  2. Right ventricular systolic function is normal. The right ventricular size is normal.  3. AV is trileaflet. It is thickened, calcified with minimally restricted motion. Peak and mean gradients through the valve is 15 and 8 mm Hg respectively consistent with very mild AS.Marland Kitchen The aortic valve is abnormal. Aortic valve regurgitation is not visualized. Mild aortic valve stenosis.  4. Aortic dilatation noted. There is mild dilatation of the ascending aorta, measuring 38 mm.  5. The inferior vena cava is normal in size with greater than 50% respiratory variability, suggesting right atrial pressure of 3 mmHg. FINDINGS  Left Ventricle: Global longitudinal strain is -19.2%. Left ventricular ejection fraction, by estimation, is 70 to 75%. The left ventricle has hyperdynamic function. The left ventricle has no regional wall motion abnormalities. The left ventricular internal cavity size was normal in size. There is no left ventricular hypertrophy. Left ventricular diastolic parameters were normal. Right Ventricle: The right ventricular size is normal. Right vetricular wall thickness was not assessed. Right ventricular systolic function is normal. Left Atrium: Left atrial size was normal in size. Right Atrium: Right atrial size was normal in size. Pericardium: Trivial pericardial effusion is present. Mitral Valve: The mitral valve is abnormal. Mild mitral annular calcification. Trivial mitral valve regurgitation. Tricuspid Valve: The tricuspid valve is normal in structure. Tricuspid valve regurgitation is trivial. Aortic  Valve: AV is trileaflet. It is thickened, calcified with minimally restricted motion. Peak and mean gradients through the valve is 15 and 8 mm Hg respectively consistent with very mild AS. The aortic valve is abnormal. Aortic valve regurgitation is not visualized. Mild aortic stenosis is present. Aortic valve mean gradient measures 8.5 mmHg. Aortic valve peak gradient measures 16.2 mmHg. Aortic valve area,  by VTI measures 2.02 cm. Pulmonic Valve: The pulmonic valve was normal in structure. Pulmonic valve regurgitation is trivial. Aorta: The aortic root and ascending aorta are structurally normal, with no evidence of dilitation, the aortic root is normal in size and structure and aortic dilatation noted. There is mild dilatation of the ascending aorta, measuring 38 mm. Venous: The inferior vena cava is normal in size with greater than 50% respiratory variability, suggesting right atrial pressure of 3 mmHg. IAS/Shunts: No atrial level shunt detected by color flow Doppler.  LEFT VENTRICLE PLAX 2D LVIDd:         4.90 cm  Diastology LVIDs:         2.90 cm  LV e' medial:    8.51 cm/s LV PW:         1.00 cm  LV E/e' medial:  13.4 LV IVS:        1.00 cm  LV e' lateral:   8.35 cm/s LVOT diam:     2.00 cm  LV E/e' lateral: 13.7 LV SV:         77 LV SV Index:   37       2D Longitudinal Strain LVOT Area:     3.14 cm 2D Strain GLS (A2C):   -19.2 %                         2D Strain GLS (A3C):   -18.6 %                         2D Strain GLS (A4C):   -19.9 %                         2D Strain GLS Avg:     -19.2 %                          3D Volume EF:                         3D EF:        62 %                         LV EDV:       130 ml                         LV ESV:       50 ml                         LV SV:        80 ml RIGHT VENTRICLE RV Basal diam:  3.60 cm RV S prime:     16.10 cm/s TAPSE (M-mode): 2.4 cm LEFT ATRIUM             Index       RIGHT ATRIUM           Index LA diam:        3.60 cm 1.72 cm/m  RA Pressure: 3.00  mmHg LA Vol (A2C):   55.2 ml 26.33 ml/m RA Area:     13.10 cm LA Vol (A4C):   47.6 ml 22.71 ml/m RA Volume:   28.60 ml  13.64 ml/m LA Biplane Vol: 54.5 ml 26.00 ml/m  AORTIC VALVE AV Area (Vmax):    2.28 cm AV Area (Vmean):   1.73 cm AV Area (VTI):     2.02 cm AV Vmax:           201.00 cm/s AV Vmean:          132.500 cm/s AV VTI:            0.379 m AV Peak Grad:      16.2 mmHg AV Mean Grad:      8.5 mmHg LVOT Vmax:         146.00 cm/s LVOT Vmean:        73.000 cm/s LVOT VTI:          0.244 m LVOT/AV VTI ratio: 0.64  AORTA Ao Root diam: 4.00 cm Ao Asc diam:  3.80 cm MITRAL VALVE                TRICUSPID VALVE                             Estimated RAP:  3.00 mmHg  MV E velocity: 114.00 cm/s  SHUNTS MV A velocity: 58.70 cm/s   Systemic VTI:  0.24 m MV E/A ratio:  1.94         Systemic Diam: 2.00 cm Dorris Carnes MD Electronically signed by Dorris Carnes MD Signature Date/Time: 04/02/2020/12:33:19 PM    Final     Impression/Plan: 67. 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 He appears to have recovered well from the effects of his recent SBRT and is currently without complaints.  We discussed that while we are happy to continue to participate in his care if clinically indicated, at this point, we will plan to see him back on an as-needed basis.  He will continue in routine follow-up under the care and direction of Dr. Alen Blew and Dr. [name].  He is scheduled for a follow-up visit with Dr. Alen Blew on 04/12/2020 with repeat labs and imaging prior to that visit to help determine whether there is any need for further systemic therapy.  He is unaware of any scheduled follow-up with Dr. [name] in the urology office.  He knows that he is welcome to call at anytime with any questions or concerns related to his previous radiation.    Nicholos Johns, PA-C

## 2020-04-10 ENCOUNTER — Inpatient Hospital Stay: Payer: Medicare Other

## 2020-04-11 ENCOUNTER — Other Ambulatory Visit: Payer: Self-pay

## 2020-04-11 ENCOUNTER — Encounter (HOSPITAL_COMMUNITY): Payer: Self-pay

## 2020-04-11 ENCOUNTER — Ambulatory Visit (HOSPITAL_COMMUNITY)
Admission: RE | Admit: 2020-04-11 | Discharge: 2020-04-11 | Disposition: A | Discharge status: Home / Self Care | Payer: Medicare Other | Source: Ambulatory Visit | Attending: Oncology | Admitting: Oncology

## 2020-04-11 ENCOUNTER — Inpatient Hospital Stay: Payer: Medicare Other | Attending: Oncology

## 2020-04-11 DIAGNOSIS — C7800 Secondary malignant neoplasm of unspecified lung: Secondary | ICD-10-CM | POA: Insufficient documentation

## 2020-04-11 DIAGNOSIS — Z79899 Other long term (current) drug therapy: Secondary | ICD-10-CM | POA: Diagnosis not present

## 2020-04-11 DIAGNOSIS — I700 Atherosclerosis of aorta: Secondary | ICD-10-CM | POA: Diagnosis not present

## 2020-04-11 DIAGNOSIS — N2889 Other specified disorders of kidney and ureter: Secondary | ICD-10-CM

## 2020-04-11 DIAGNOSIS — Z7982 Long term (current) use of aspirin: Secondary | ICD-10-CM | POA: Insufficient documentation

## 2020-04-11 DIAGNOSIS — R918 Other nonspecific abnormal finding of lung field: Secondary | ICD-10-CM

## 2020-04-11 DIAGNOSIS — C641 Malignant neoplasm of right kidney, except renal pelvis: Secondary | ICD-10-CM | POA: Diagnosis not present

## 2020-04-11 DIAGNOSIS — C649 Malignant neoplasm of unspecified kidney, except renal pelvis: Secondary | ICD-10-CM

## 2020-04-11 DIAGNOSIS — C7951 Secondary malignant neoplasm of bone: Secondary | ICD-10-CM | POA: Insufficient documentation

## 2020-04-11 LAB — CBC WITH DIFFERENTIAL (CANCER CENTER ONLY)
Abs Immature Granulocytes: 0.03 10*3/uL (ref 0.00–0.07)
Basophils Absolute: 0.1 10*3/uL (ref 0.0–0.1)
Basophils Relative: 1 %
Eosinophils Absolute: 0.2 10*3/uL (ref 0.0–0.5)
Eosinophils Relative: 3 %
HCT: 38.3 % — ABNORMAL LOW (ref 39.0–52.0)
Hemoglobin: 13.3 g/dL (ref 13.0–17.0)
Immature Granulocytes: 1 %
Lymphocytes Relative: 18 %
Lymphs Abs: 1.1 10*3/uL (ref 0.7–4.0)
MCH: 32 pg (ref 26.0–34.0)
MCHC: 34.7 g/dL (ref 30.0–36.0)
MCV: 92.1 fL (ref 80.0–100.0)
Monocytes Absolute: 0.7 10*3/uL (ref 0.1–1.0)
Monocytes Relative: 12 %
Neutro Abs: 4 10*3/uL (ref 1.7–7.7)
Neutrophils Relative %: 65 %
Platelet Count: 224 10*3/uL (ref 150–400)
RBC: 4.16 MIL/uL — ABNORMAL LOW (ref 4.22–5.81)
RDW: 13 % (ref 11.5–15.5)
WBC Count: 6 10*3/uL (ref 4.0–10.5)
nRBC: 0 % (ref 0.0–0.2)

## 2020-04-11 LAB — CMP (CANCER CENTER ONLY)
ALT: 16 U/L (ref 0–44)
AST: 18 U/L (ref 15–41)
Albumin: 3.4 g/dL — ABNORMAL LOW (ref 3.5–5.0)
Alkaline Phosphatase: 70 U/L (ref 38–126)
Anion gap: 7 (ref 5–15)
BUN: 18 mg/dL (ref 8–23)
CO2: 30 mmol/L (ref 22–32)
Calcium: 9.9 mg/dL (ref 8.9–10.3)
Chloride: 103 mmol/L (ref 98–111)
Creatinine: 1.51 mg/dL — ABNORMAL HIGH (ref 0.61–1.24)
GFR, Estimated: 45 mL/min — ABNORMAL LOW (ref >60)
Glucose, Bld: 107 mg/dL — ABNORMAL HIGH (ref 70–99)
Potassium: 4.1 mmol/L (ref 3.5–5.1)
Sodium: 140 mmol/L (ref 135–145)
Total Bilirubin: 0.7 mg/dL (ref 0.3–1.2)
Total Protein: 7.1 g/dL (ref 6.5–8.1)

## 2020-04-11 IMAGING — CT CT CHEST W/ CM
2 of 13 series · 10 of 46 positions shown, 16 images · IV contrast (omnipaque)
Comparison: [DATE] from [HOSPITAL]

CLINICAL DATA: Right renal cell carcinoma with nephrectomy in [EV].
metastasis diagnosed in [DATE]. Finished radiation therapy 4
weeks ago. Remote smoking history.

EXAM:
CT CHEST WITH CONTRAST
CT ABDOMEN AND PELVIS WITH AND WITHOUT CONTRAST
TECHNIQUE: Multidetector CT imaging of the chest was performed during
intravenous contrast administration. Multidetector CT imaging of the
abdomen and pelvis was performed following the standard protocol
before and during bolus administration of intravenous contrast.
CONTRAST:  80mL OMNIPAQUE IOHEXOL 350 MG/ML SOLN

[Series 3: coronal pre · coronal · non-contrast · 0.55mm/px · 2 of 90 slices shown, 3 images]
[im 30/90  soft-tissue]
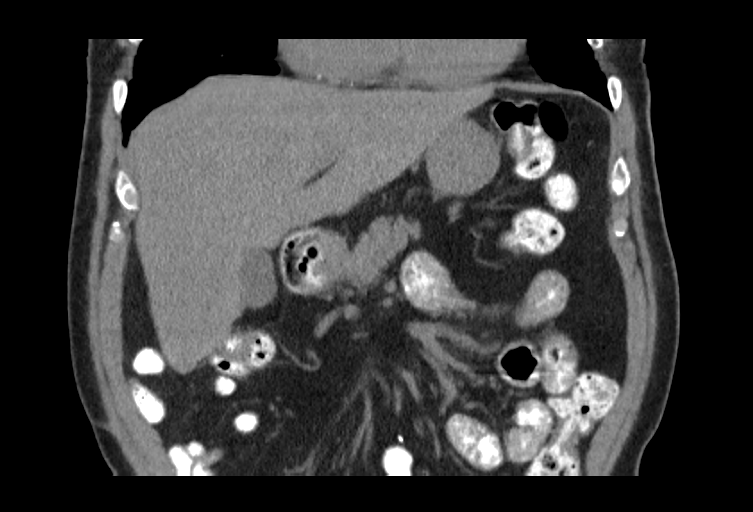
[im 30/90  bone]
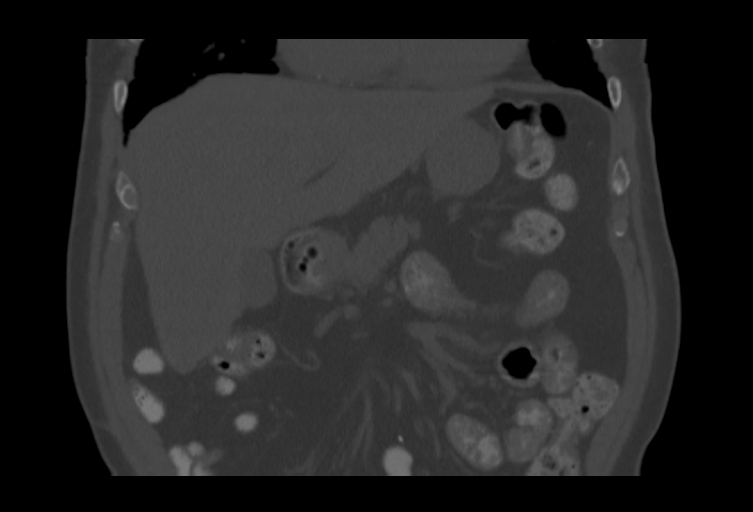
[im 60/90  soft-tissue]
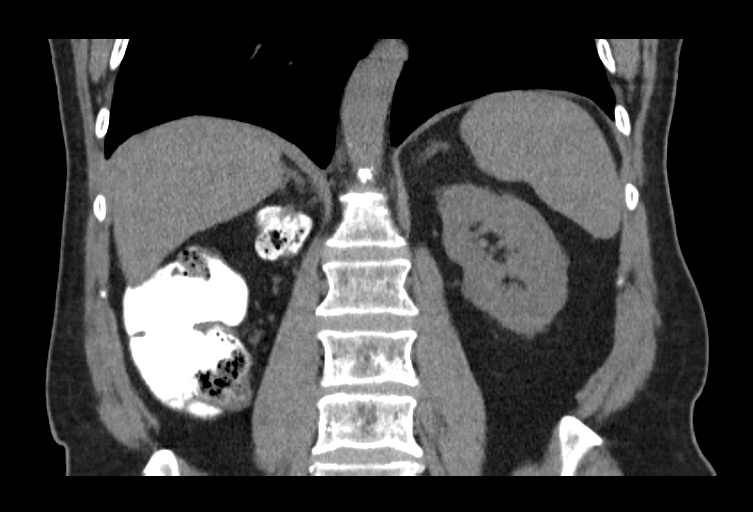

[Series 11: axial nephro · axial · 0.81mm/px · z∈[-416,+82]mm · 8 of 214 slices shown, 13 images]
[im 24/214  soft-tissue]
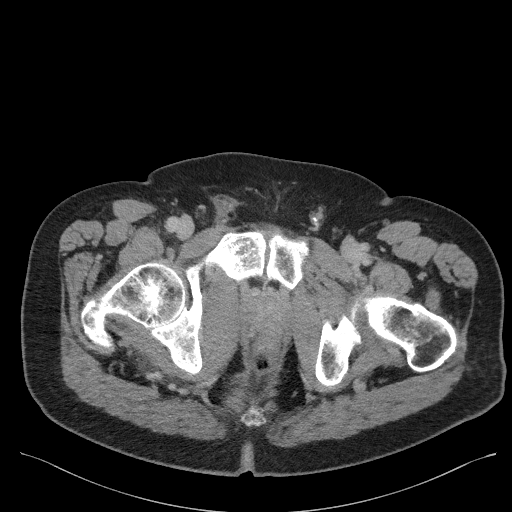
[im 24/214  bone]
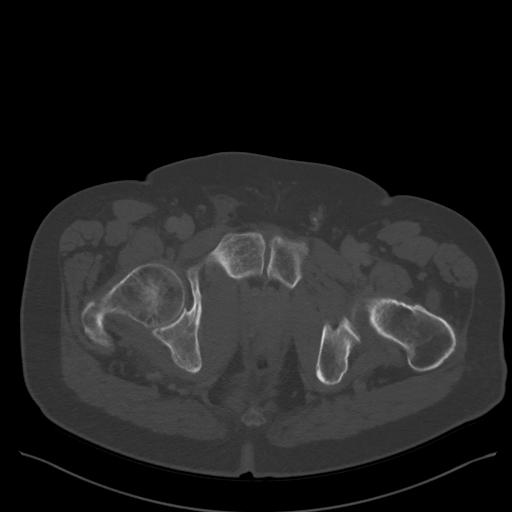
[im 48/214  soft-tissue]
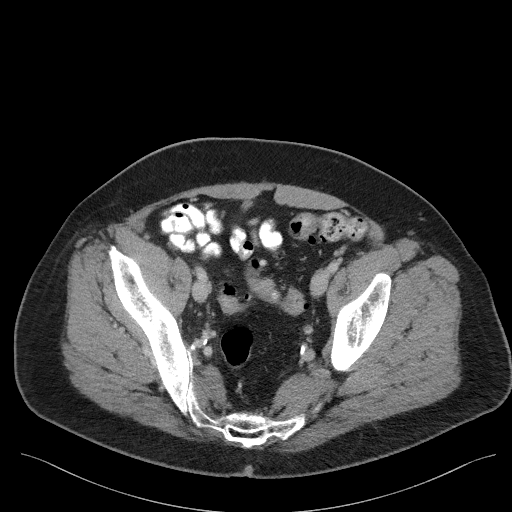
[im 72/214  soft-tissue]
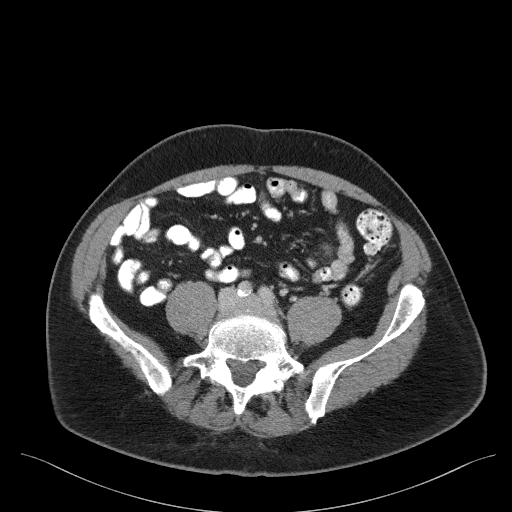
[im 95/214  soft-tissue]
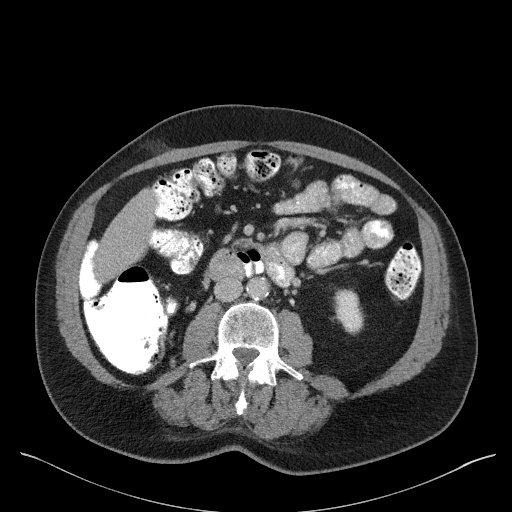
[im 119/214  soft-tissue]
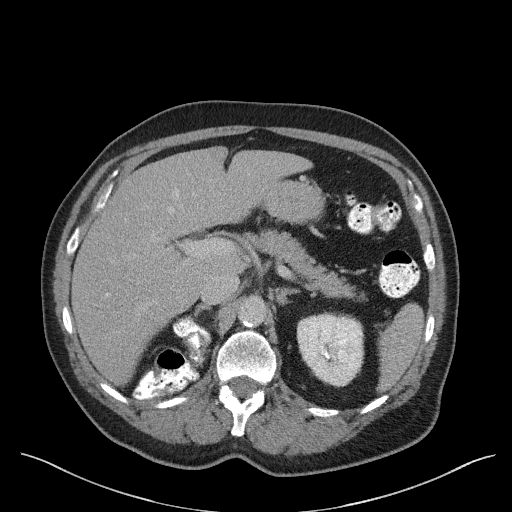
[im 119/214  lung]
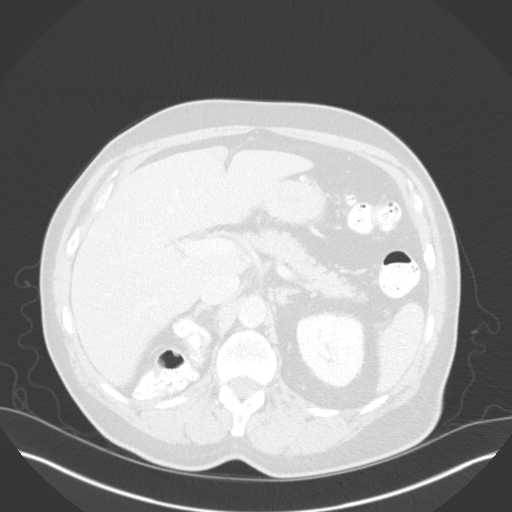
[im 143/214  soft-tissue]
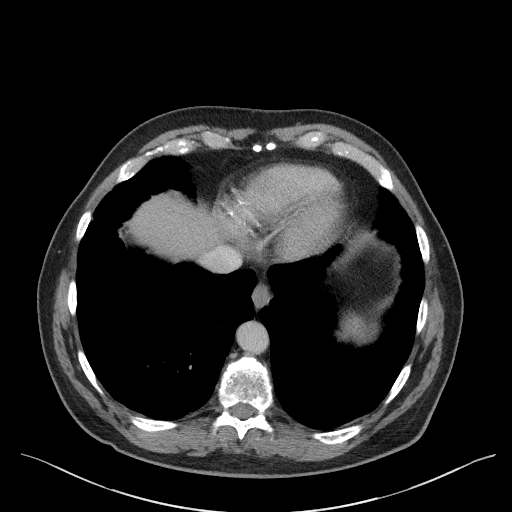
[im 143/214  lung]
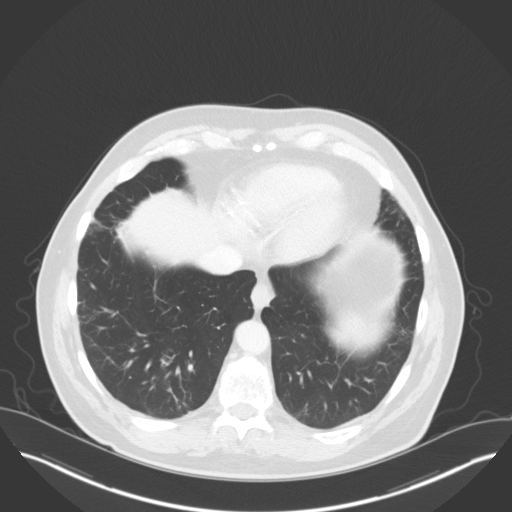
[im 166/214  soft-tissue]
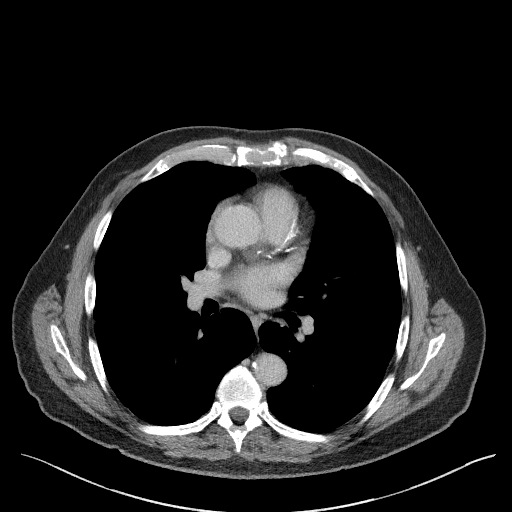
[im 166/214  lung]
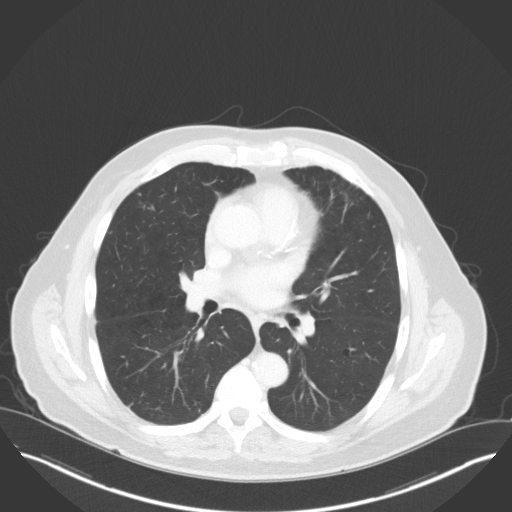
[im 190/214  soft-tissue]
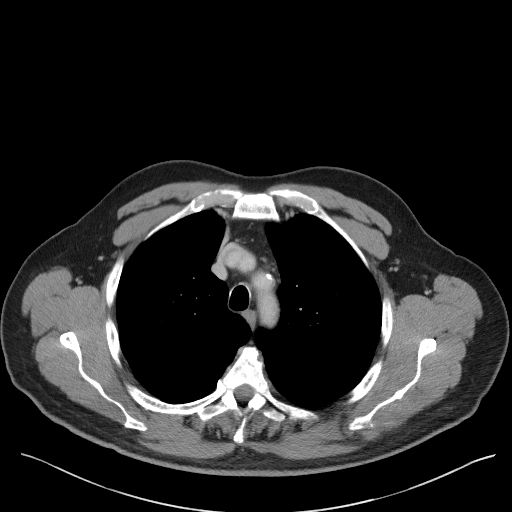
[im 190/214  lung]
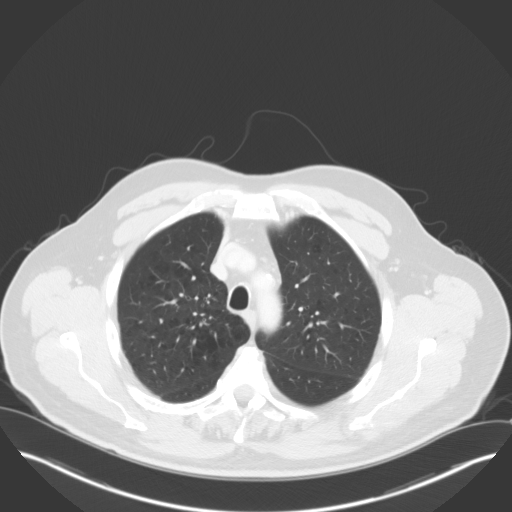

[10 of 46 positions shown; findings below may reference images not displayed]

FINDINGS: CT CHEST FINDINGS

Cardiovascular: Aortic atherosclerosis. Tortuous thoracic aorta.
Normal heart size, without pericardial effusion. Multivessel
coronary artery atherosclerosis. No central pulmonary embolism, on
this non-dedicated study.

Mediastinum/Nodes: No supraclavicular adenopathy. No mediastinal or
hilar adenopathy.

Lungs/Pleura: No pleural fluid.  Moderate centrilobular emphysema.

Bilateral pulmonary nodules on the order of 3-4 mm. Many of these
are similar and therefore likely benign. There are some nodules
which are new today.

Examples in the posterior upper lobes at 3 mm on 25/15 bilaterally.

Within the anterior right upper lobe at 3 mm on 46/15.

Within the left lower lobe, along the left major fissure at 3 mm on
47/15.

A 2 mm right lower lobe nodule on 105/15 is likely new.

Similar areas of scarring and interstitial thickening bilaterally.

The left lower lobe pulmonary nodule of interest measures 12 x 9 mm
on 116/15 versus 10 x 9 mm on the prior exam (when remeasured).

Musculoskeletal: No acute osseous abnormality.

CT ABDOMEN AND PELVIS FINDINGS

Hepatobiliary: Normal liver. Normal gallbladder, without biliary
ductal dilatation.

Pancreas: Suspect pancreas divisum, with the prominent dorsal duct
entering the duodenum on 47/6. No duct dilatation or acute
inflammation.

Spleen: Normal in size, without focal abnormality.

Adrenals/Urinary Tract: Left adrenal nodule of 11 mm is similar and
considered benign given stability.

Right nephrectomy, without locally recurrent disease.

No left renal calculi or hydronephrosis.  Normal urinary bladder.

Stomach/Bowel: Underdistended gastric cardiac body. Transverse
duodenal diverticulum. Otherwise normal small bowel. Extensive
colonic diverticulosis. Normal terminal ileum and appendix.

Vascular/Lymphatic: Aortic and branch vessel atherosclerosis. No
abdominopelvic adenopathy.

Reproductive: Mild prostatomegaly.

Other: No significant free fluid.

Musculoskeletal: Lytic lesion involving the right inferior pubic
ramus with cortical destruction and soft tissue components. Grossly
similar at 7.1 x 4.3 cm on 201/11.

A new posterior right acetabular lytic lesion measures 3.1 x 3.3 cm
on 183/11. Trace L4-5 anterolisthesis.
IMPRESSION: CT CHEST IMPRESSION

1. Pulmonary metastasis are felt to be slightly progressive. The
dominant left lower lobe nodule has minimally enlarged. There also
multiple new tiny nodules which are technically indeterminate but
suspicious for metastasis.
2. No thoracic adenopathy.
3. Aortic atherosclerosis ([EV]-[EV]), coronary artery
atherosclerosis and emphysema ([EV]-[EV]).

CT ABDOMEN AND PELVIS IMPRESSION

1. Progressive osseous metastasis. Although the right inferior pubic
ramus lytic lesion is similar, a new right posterior acetabular
lesion is seen, and may predispose the patient to pathologic
fracture.
2. No evidence of soft tissue metastasis within the abdomen or
pelvis.
3. Suspect pancreas divisum.

## 2020-04-11 MED ORDER — IOHEXOL 350 MG/ML SOLN
100.0000 mL | Freq: Once | INTRAVENOUS | Status: AC | PRN
  Administered 2020-04-11: 80 mL via INTRAVENOUS

## 2020-04-12 ENCOUNTER — Other Ambulatory Visit: Payer: Self-pay

## 2020-04-12 ENCOUNTER — Inpatient Hospital Stay (HOSPITAL_BASED_OUTPATIENT_CLINIC_OR_DEPARTMENT_OTHER): Payer: Medicare Other | Admitting: Oncology

## 2020-04-12 VITALS — BP 139/88 | HR 92 | Temp 97.5°F | Resp 18 | Ht 69.0 in | Wt 206.4 lb

## 2020-04-12 DIAGNOSIS — M25559 Pain in unspecified hip: Secondary | ICD-10-CM

## 2020-04-12 DIAGNOSIS — C649 Malignant neoplasm of unspecified kidney, except renal pelvis: Secondary | ICD-10-CM | POA: Diagnosis not present

## 2020-04-12 DIAGNOSIS — C641 Malignant neoplasm of right kidney, except renal pelvis: Secondary | ICD-10-CM | POA: Diagnosis not present

## 2020-04-12 NOTE — Progress Notes (Signed)
Hematology and Oncology Follow Up Visit  James Massey [id number] Dec 03, 1945 74 y.o. 04/12/2020 3:15 PM Benard Rink, DO   Principle Diagnosis: 74 year old man with kidney cancer diagnosed and November 2019.  He developed stage IV clear-cell renal cell carcinoma  in July 2021 with right pelvic bone involvement.   Prior Therapy:  He is status post laparoscopic right radical nephrectomy on June 01, 2019 2019.  He is status post right hip and left long radiation therapy completed in August 2021.  He received 50 Gray in 5 fractions  Current therapy: Under evaluation for additional therapy.  Interim History: James Massey is here for a follow-up evaluation.  Since the last visit, he reports no major changes in his health.  He completed radiation therapy without any delayed complications.  He did report some increased fatigue and tiredness which has improved at this time.  He denies any worsening cough or shortness of breath.  He does report chest wall pain which is chronic in nature.  Despite the previous epidural injection and radiation has not helped his pain.  He is ambulating with the help of cane without any falls or syncope.  He does report difficulties with range of motion and mobility at times.    Medications: Reviewed without changes. Current Outpatient Medications  Medication Sig Dispense Refill  . allopurinol (ZYLOPRIM) 300 MG tablet Take 300 mg by mouth at bedtime.     Marland Kitchen aspirin EC 81 MG tablet Take 81 mg by mouth at bedtime.  (Patient not taking: Reported on 04/05/2020)    . atorvastatin (LIPITOR) 40 MG tablet Take 40 mg by mouth at bedtime.  (Patient not taking: Reported on 04/05/2020)    . Cholecalciferol 25 MCG (1000 UT) tablet Take by mouth. (Patient not taking: Reported on 04/05/2020)    . colchicine 0.6 MG tablet Take 2 tabs once followed by 1 tab an hour later for gout flare (total 1.8 mg dose/course). Do not repeat for at least 3 days. (Patient not  taking: Reported on 04/05/2020)    . diazepam (VALIUM) 5 MG tablet Take 2.5-5 mg by mouth every 12 (twelve) hours as needed (for eye twitching).     . hydrocortisone 2.5 % cream Apply 1 application topically 2 (two) times daily as needed (for hemorroidal flare ups).    . magnesium 30 MG tablet Take 30 mg by mouth 2 (two) times daily.    . pantoprazole (PROTONIX) 40 MG tablet Take 40 mg by mouth 2 (two) times daily.    . pseudoephedrine-acetaminophen (TYLENOL SINUS) 30-500 MG TABS tablet Take 1-2 tablets by mouth every 4 (four) hours as needed (for sinus issues).    . Simethicone 180 MG CAPS Take 180 mg by mouth 3 (three) times daily as needed (for gas/indigestion.).    Marland Kitchen traMADol (ULTRAM) 50 MG tablet Take 1 tablet (50 mg total) by mouth every 8 (eight) hours as needed. (Patient not taking: Reported on 04/05/2020) 15 tablet 0   No current facility-administered medications for this visit.     Allergies:  Allergies  Allergen Reactions  . Bee Venom Swelling  . Budesonide-Formoterol Fumarate Other (See Comments)    RESPIRATORY ISSUES  . Poison [employer] and ivy  . Clindamycin/Lincomycin Rash     Physical Exam: Blood pressure 139/88, pulse 92, temperature (!) 97.5 F (36.4 C), temperature source Tympanic, resp. rate 18, height 5\' 9"  (1.753 m), weight 206 lb 6.4 oz (93.6 kg), SpO2 99 %.  ECOG: 1   General appearance: Alert, awake without any distress. Head: Atraumatic without abnormalities Oropharynx: Without any thrush or ulcers. Eyes: No scleral icterus. Lymph nodes: No lymphadenopathy noted in the cervical, supraclavicular, or axillary nodes Heart:regular rate and rhythm, without any murmurs or gallops.   Lung: Clear to auscultation without any rhonchi, wheezes or dullness to percussion. Abdomin: Soft, nontender without any shifting dullness or ascites. Musculoskeletal: No clubbing or cyanosis. Neurological: No motor or sensory deficits. Skin: No rashes or  lesions.       Lab Results: Lab Results  Component Value Date   WBC 6.0 04/11/2020   HGB 13.3 04/11/2020   HCT 38.3 (L) 04/11/2020   MCV 92.1 04/11/2020   PLT 224 04/11/2020     Chemistry      Component Value Date/Time   NA 140 04/11/2020 1010   K 4.1 04/11/2020 1010   CL 103 04/11/2020 1010   CO2 30 04/11/2020 1010   BUN 18 04/11/2020 1010   CREATININE 1.51 (H) 04/11/2020 1010      Component Value Date/Time   CALCIUM 9.9 04/11/2020 1010   ALKPHOS 70 04/11/2020 1010   AST 18 04/11/2020 1010   ALT 16 04/11/2020 1010   BILITOT 0.7 04/11/2020 1010     IMPRESSION: CT CHEST IMPRESSION  1. Pulmonary metastasis are felt to be slightly progressive. The dominant left lower lobe nodule has minimally enlarged. There also multiple new tiny nodules which are technically indeterminate but suspicious for metastasis. 2. No thoracic adenopathy. 3. Aortic atherosclerosis (ICD10-I70.0), coronary artery atherosclerosis and emphysema (ICD10-J43.9).  CT ABDOMEN AND PELVIS IMPRESSION  1. Progressive osseous metastasis. Although the right inferior pubic ramus lytic lesion is similar, a new right posterior acetabular lesion is seen, and may predispose the patient to pathologic fracture. 2. No evidence of soft tissue metastasis within the abdomen or pelvis. 3. Suspect pancreas divisum.  Impression and Plan:   74 year old man with:  1.    Kidney cancer diagnosed in 2019.  He subsequently developed stage IV clear-cell metastasis to the right pubic ramus in July 2021.    He status post radiation therapy outlined above and currently on active surveillance.  CT scan obtained on 04/11/2020 were reviewed and showed overall stable disease and has pulmonary nodules that are slightly progressive but overall very little change noted.  He does have however progressive right inferior pubic ramus lytic lesion that needs to be evaluated further.  The natural course of this disease and  treatment options moving forward were discussed.  The role for systemic therapy was discussed including oral targeted therapy, immunotherapy or combination of the above.  These options will be needed in the near future after addressing his right hip.  2.  Right acetabular lesion: Described as a new lesion on his recent CT scan with clinical significance.  He has reported chronic hip and pelvic pain with possibility of impending fracture based on the location.  I will make urgent referral to orthopedic surgery to evaluate the need for surgery prophylactically.  If surgery is not required, radiation therapy would be used instead.  In the meantime I have advised him to limit weightbearing is fully evaluated at this time.  3.  Prognosis and goals of care: Therapy remains palliative at this time and no cure for his disease is available currently.  His disease is incurable.    4. Follow up: In the next 4 weeks for repeat evaluation.   30  minutes were dedicated to this visit.  The time was spent on reviewing his disease status, discussing treatment options and future plan of care review.   [name], MD 10/7/20213:15 PM

## 2020-04-16 ENCOUNTER — Telehealth: Payer: Self-pay | Admitting: Radiology

## 2020-04-16 ENCOUNTER — Encounter: Payer: Self-pay | Admitting: Physician Assistant

## 2020-04-16 ENCOUNTER — Ambulatory Visit (INDEPENDENT_AMBULATORY_CARE_PROVIDER_SITE_OTHER): Payer: Medicare Other | Admitting: Physician Assistant

## 2020-04-16 VITALS — Ht 69.0 in | Wt 204.6 lb

## 2020-04-16 DIAGNOSIS — C649 Malignant neoplasm of unspecified kidney, except renal pelvis: Secondary | ICD-10-CM | POA: Diagnosis not present

## 2020-04-16 DIAGNOSIS — C7951 Secondary malignant neoplasm of bone: Secondary | ICD-10-CM

## 2020-04-16 NOTE — Progress Notes (Signed)
Office Visit Note   Patient: James Massey           Date of Birth: [birth year]           MRN: [id number] Visit Date: 04/16/2020              Requested by: James Portela, MD [place],  [place] [address] PCP: James Girt, DO   Assessment & Plan: Visit Diagnoses:  1. 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  Patient and his wife would like input from Dr. [name] in regards to referral to orthopedic oncology.  We will have Dr. Ninfa Massey discuss this with Dr. [name] and then Dr. Ninfa Massey or Dr. [name] will contact patient and his wife about the referral.  In the interim would recommend offloading the right hip with a walker as he is doing walker was suggested today.  Walker was adjusted to the appropriate height today. Images and patient were discussed with Dr. Ninfa Massey at length today.  Follow-Up Instructions: Return if symptoms worsen or fail to improve.   Orders:  No orders of the defined types were placed in this encounter.  No orders of the defined types were placed in this encounter.     Procedures: No procedures performed   Clinical Data: No additional findings.   Subjective: Chief Complaint  Patient presents with  . Right Hip - Pain    HPI James Massey is a 74 year old male were seen for the first time for right acetabular lesion.  Patient is referred by Dr. [name].  Patient was diagnosed with renal cancer November 2019.  He has undergone a right radical nephrectomy May 31, 2018.  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  He is undergone radiation to the right hip which was completed August 2021.  He is also been treated previously for right hip moderate arthritis at [facility] with an intra-articular injection in March 2021. Patient  recently underwent a chest CT with contrast in the CT of the abdomen pelvis with and without contrast and was found to have a new right posterior acetabular lesion.  The right inferior pubic ramus lytic lesion.  Unchanged from prior scan.  Was felt that this new lesion may predispose the patient to pathologic fracture. CT scan images and report were reviewed with the patient and his wife today.  Review of Systems See HPI  Objective: Vital Signs: Ht 5\' 9"  (1.753 m)   Wt 204 lb 9.6 oz (92.8 kg)   BMI 30.21 kg/m   Physical Exam Pulmonary:     Effort: Pulmonary effort is normal.  Neurological:     Mental Status: He is alert and oriented to person, place, and time.  Psychiatric:        Mood and Affect: Mood normal.     Ortho Exam  Specialty Comments:  No specialty comments available.  Imaging: No results found.   PMFS History: Patient Active Problem List   Diagnosis Date Noted  . 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 Renal mass, left 05/31/2018   Past Medical History:  Diagnosis Date  . Anxiety   . Barrett esophagus   . GERD (gastroesophageal reflux disease)   . Gout   .  Heart murmur   . History of anemia   . Kidney cancer, primary, 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 to other site [facility])   . Mixed hyperlipidemia   . Renal cell cancer, right ([place])   . Right renal mass   . Wears glasses     Family History  Problem Relation Age of Onset  . Breast cancer Mother   . Heart attack Father   . Prostate cancer Neg Hx   . Pancreatic cancer Neg Hx   . Colon cancer Neg Hx     Past Surgical History:  Procedure Laterality Date  . CYSTOSCOPY WITH RETROGRADE PYELOGRAM, URETEROSCOPY AND STENT PLACEMENT Right 05/21/2018   Procedure: RIGHT RETROGRADE PYELOGRAM, RIGHT DIAGNOSTIC URETEROSCOPY AND STENT PLACEMENT;  Surgeon: Ardis Hughs, MD;  Location: [facility];  Service: Urology;  Laterality:  Right;  . INGUINAL HERNIA REPAIR Bilateral 09/2012  . LAPAROSCOPIC NEPHRECTOMY, HAND ASSISTED Right 05/31/2018   Procedure: LAPAROSCOPIC RADICAL RIGHT NEPHRECTOMY;  Surgeon: Ardis Hughs, MD;  Location: [facility];  Service: Urology;  Laterality: Right;   Social History   Occupational History  . Occupation: retired Biochemist, clinical  Tobacco Use  . Smoking status: Former Smoker    Packs/day: 0.50    Years: 15.00    Pack years: 7.50    Types: Cigarettes    Quit date: 12/12/1987    Years since quitting: 32.3  . Smokeless tobacco: Never Used  Vaping Use  . Vaping Use: Never used  Substance and Sexual Activity  . Alcohol use: Yes    Comment: occ beer   . Drug use: Never  . Sexual activity: Not Currently

## 2020-04-16 NOTE — Telephone Encounter (Signed)
Patient was seen in office today with wife, referred by Dr. Alen Blew.  Patient and family were under the impression that they were in our office today to discuss surgery. Unfortunately, more discussion is needed for future care by Dr. Ninfa Linden and Dr. Alen Blew for more appropriate recommendations for surgery.  Family was very upset during and after visit today. Requests Dr. Ninfa Linden and Dr. Alen Blew consult together on future care.   I called [place]: Dr. Hazeline Junker direct office line is [phone] and pager is [phone]

## 2020-04-17 ENCOUNTER — Telehealth: Payer: Self-pay

## 2020-04-17 NOTE — Progress Notes (Signed)
Patient referral to Dr Magda Bernheim office for diagnosis of metastatic renal cell carcinoma to the right hip.   Office notes faxed over to Dr Nicki Reaper [facility] office [phone]. Office number is [phone]. Patient scheduled to be seen with Dr Redmond Pulling 05/15/20 at 930 am . Patient needs to be seen sooner per Dr Alen Blew for risk of hip fracture. Office staff to reach out back out with a possible sooner appointment date. Called patient's wife back with information, advised that Dr Redmond Pulling office is working on getting him seen sooner. She verbalized understanding and will make patient aware.

## 2020-04-17 NOTE — Telephone Encounter (Signed)
Called patient's wife back per Dr Alen Blew. Patient's wife states that they do not want to go back to the same office. They had a bad expereince with the office and wants Dr Alen Blew to make the referral. Working on referral to Dr. Magda Bernheim at [place] [phone]

## 2020-04-17 NOTE — Telephone Encounter (Signed)
-----   Message from [name], MD sent at 04/17/2020 11:54 AM EDT ----- I agree with this referral. Thanks ----- Message ----- From: Kennedy Bucker, LPN Sent: 80/22/3361  11:47 AM EDT To: [name], MD  Looking back on the note from the PA yesterday, it looks like orthopedics are wanting to refer the patient to orthopedics oncology and the patient and his wife are wanting your input on seeing the orthopedic oncologist.   ----- Message ----- From: [name], MD Sent: 04/17/2020  11:41 AM EDT To: Kennedy Bucker, LPN  What were they told? I can't offer clarification about what they were told.  ----- Message ----- From: Kennedy Bucker, LPN Sent: 22/44/9753  11:02 AM EDT To: [name], MD  Patient's wife called about appointment from yesterday with orthopedics. Wife and patient seems confused about the purpose of the appointment yesterday. They thought that they were going to see the surgeon and getting a surgery date. They are wanted some clarification on what is going on with surgery and treatment. Please advise.   Kim LPN

## 2020-04-24 ENCOUNTER — Other Ambulatory Visit: Payer: Self-pay | Admitting: *Deleted

## 2020-04-24 ENCOUNTER — Encounter: Payer: Self-pay | Admitting: Radiation Oncology

## 2020-04-24 MED ORDER — METOPROLOL SUCCINATE ER 25 MG PO TB24: 25.0000 mg | ORAL_TABLET | Freq: Every day | ORAL | 3 refills | Status: DC

## 2020-04-24 NOTE — Progress Notes (Signed)
  Radiation Oncology         [phone]) [phone] ________________________________  Name: James Massey MRN: [id number]  Date: 04/24/2020  DOB: [birth year]  Chart Note:  I spoke with Dr. Magda Bernheim today about this case.  I reviewed this patient's most recent findings and wanted to take a minute to document my impression.  He has a new right posterior acetabular metastasis, that is mechanically stable and outside of his previous right ischial SBRT targeted area.  Dr. Redmond Pulling and I agreed that SBRT may be a good option, reserving surgical therapy for salvage if needed.  We'll work to get him back in to discuss.    ________________________________  Sheral Apley Tammi Klippel, M.D.

## 2020-04-27 ENCOUNTER — Ambulatory Visit: Payer: Medicare Other

## 2020-04-27 ENCOUNTER — Ambulatory Visit
Admission: RE | Admit: 2020-04-27 | Discharge: 2020-04-27 | Disposition: A | Discharge status: Home / Self Care | Payer: Medicare Other | Source: Ambulatory Visit | Attending: Radiation Oncology | Admitting: Radiation Oncology

## 2020-04-27 ENCOUNTER — Institutional Professional Consult (permissible substitution): Payer: Medicare Other | Admitting: Urology

## 2020-04-27 ENCOUNTER — Encounter: Payer: Self-pay | Admitting: Urology

## 2020-04-27 ENCOUNTER — Other Ambulatory Visit: Payer: Self-pay

## 2020-04-27 VITALS — Ht 69.0 in | Wt 204.0 lb

## 2020-04-27 DIAGNOSIS — C7951 Secondary malignant neoplasm of bone: Secondary | ICD-10-CM | POA: Insufficient documentation

## 2020-04-27 DIAGNOSIS — C649 Malignant neoplasm of unspecified kidney, except renal pelvis: Secondary | ICD-10-CM | POA: Diagnosis present

## 2020-04-27 NOTE — Progress Notes (Signed)
  Radiation Oncology         [phone] ________________________________  Name: James Massey MRN: [id number]  Date: 04/27/2020  DOB: Nov 08, 1945  STEREOTACTIC BODY RADIOTHERAPY SIMULATION AND TREATMENT PLANNING NOTE    ICD-10-CM   1. 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  NARRATIVE:  The patient was brought to the [facility].  Identity was confirmed.  All relevant records and images related to the planned course of therapy were reviewed.  The patient freely provided informed written consent to proceed with treatment after reviewing the details related to the planned course of therapy. The consent form was witnessed and verified by the simulation staff.  Then, the patient was set-up in a stable reproducible  supine position for radiation therapy.  A BodyFix immobilization pillow was fabricated for reproducible positioning.  Surface markings were placed.  The CT images were loaded into the planning software.  The gross target volumes (GTV) and planning target volumes (PTV) were delinieated, and avoidance structures were contoured.  Treatment planning then occurred.  The radiation prescription was entered and confirmed.  A total of two complex treatment devices were fabricated in the form of the BodyFix immobilization pillow and a neck accuform cushion.  I have requested : 3D Simulation  I have requested a DVH of the following structures: targets and all normal structures near the target including bowel and bladder as noted on the radiation plan to maintain doses in adherence with established limits  SPECIAL TREATMENT PROCEDURE:  The planned course of therapy using radiation constitutes a special treatment procedure. Special care is required in the management of this patient for the following reasons. High dose per fraction  requiring special monitoring for increased toxicities of treatment including daily imaging..  The special nature of the planned course of radiotherapy will require increased physician supervision and oversight to ensure patient's safety with optimal treatment outcomes.  PLAN:  The patient will receive 50 Gy in 5 fractions to the right posterior acetabular bone metastasis.  ________________________________  Sheral Apley Tammi Klippel, M.D.

## 2020-04-27 NOTE — Progress Notes (Signed)
MyChart video visit with patient and wife to discuss treatment of right hip lesion from metastatic renal cell carcinoma. Patient denies right hip pain. Patient denies numbness or tingling down right leg. Patient reports weakness and intermittent right knee pain which he relates to effects of arthritis. Reports using a walker for safety. Patient reports feeling steady on his feet when using his walker but not steady without. Wife reports he "scoots" instead of picking up his feet. Patient reports continued intermittent sternal and left 2-3 rib pain that is less overall but intense when he sneezes. Patient reports when this pain presents the only thing that eases it is a hot pack.   Ht 5\' 9"  (1.753 m)   Wt 204 lb (92.5 kg)   BMI 30.13 kg/m

## 2020-04-27 NOTE — Progress Notes (Signed)
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    ICD-10-CM   1. Secondary malignant neoplasm of bone and bone marrow (HCC)  C79.51    C79.52     HISTORY OF PRESENT ILLNESS: James Massey is a 74 y.o. malewas initially seen on 7/27/21at the request of Dr. Alen Blew. He was initially diagnosed with renal cell carcinoma in 2019 after presenting with gross hematuria. He was treated with right radical nephrectomy on 05/31/2018 under the care of Dr. [name]. Final surgical pathology showed a stage T3a clear cell renal carcinoma with tumor invasion into the renal sinus fatty tissue, grade 3 with negative surgical margins and had remained in observation since that time with restaging scans showing no evidence of disease recurrence or metastasis.  He underwent surveillance CT C/A/P on 12/27/2019 showing a new 6.8 cm large expansile mass in the right inferior pubic ramus with a smaller lytic lesion in the right acetabular roof as well as 10 mm nodule in the LLL, concerning for metastases and additional tiny peripheral subpleural pulmonary nodules in the posterior RUL and RLL. He was having some persistent posterior right hip pain that was worse with sitting and pain in the right groin that was worse with weightbearing and activities.  He had been seeing an orthopedist and had a cortisone injection that only provided relief for approximately 2 days before the pain returned.   CT-guided core biopsy of the pubic ramus mass was performed on 01/19/2020 with final surgical  pathology confirming metastatic clear cell renal cell carcinoma, grade 2.  He met in consultation with Dr. Alen Blew on 01/18/2020 to discuss potential systemic treatment options.  His case was also presented at the multidisciplinary urologic oncology conference where consensus was to proceed with stereotactic radiation to the oligometastatic disease in the right hip and left lower lobe lung. We met with the patient on 01/31/2020 to discus SBRT. The patient and Dr. Alen Blew met back to discuss possible concurrent systemic therapy but agreed to hold off on immunotherapy for a few months and re-evaluate after his next set of staging scans. He subsequently completed 5 fractions of SBRT to the metastatic disease in the right posterior pubic ramus and left lung nodule-completed on 03/01/20.  More recently, he underwent restaging CT C/A/P on 04/11/2020 showing slightly progressive pulmonary metastases with a dominant LLL nodule minimally enlarged as well as indeterminate new tiny puilmonary nodules bilaterally. There was also evidence of progressive osseous metastases with a new right posterior acetabular lesion but the recently treated right pubic ramus lytic lesion appeared similar. There was no thoracic adenopathy or evidence of soft tissue metastasis within the abdomen or pelvis.  He was referred to Dr. Redmond Pulling in orthopedic oncology to discuss possible surgical treatment of the new right acetabular lesion which appeared threatening for future fracture. Dr. Kalman Drape reached out to Dr. Tammi Klippel on 04/24/2020 and discussion was documented indicating that the new right posterior acetabular metastasis is mechanically stable and outside of his previous right ischial SBRT targeted area.  Dr. Redmond Pulling and Tammi Klippel agreed that SBRT may be a good option  for management of the acetabular lesion, reserving surgical therapy for salvage if needed in the future.     Therefore, he has kindly been referred back to [place] today to further discuss  additional radiotherapy to the new acetabular metastasis.   PREVIOUS RADIATION THERAPY: Yes 02/20/20 - 03/01/20: Right posterior inferior pubic ramus  + LLL Lung nodule / 50 Gy in 5 fractions (SBRT)  PAST MEDICAL HISTORY:  Past Medical History:  Diagnosis Date  . Anxiety   . Barrett esophagus   . GERD (gastroesophageal reflux disease)   . Gout   . Heart murmur   . History of anemia   . Kidney cancer, primary, 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 to other site [facility])   . Mixed hyperlipidemia   . Renal cell cancer, right ([place])   . Right renal mass   . Wears glasses       PAST SURGICAL HISTORY: Past Surgical History:  Procedure Laterality Date  . CYSTOSCOPY WITH RETROGRADE PYELOGRAM, URETEROSCOPY AND STENT PLACEMENT Right 05/21/2018   Procedure: RIGHT RETROGRADE PYELOGRAM, RIGHT DIAGNOSTIC URETEROSCOPY AND STENT PLACEMENT;  Surgeon: Ardis Hughs, MD;  Location: [facility];  Service: Urology;  Laterality: Right;  . INGUINAL HERNIA REPAIR Bilateral 09/2012  . LAPAROSCOPIC NEPHRECTOMY, HAND ASSISTED Right 05/31/2018   Procedure: LAPAROSCOPIC RADICAL RIGHT NEPHRECTOMY;  Surgeon: Ardis Hughs, MD;  Location: [facility];  Service: Urology;  Laterality: Right;    FAMILY HISTORY:  Family History  Problem Relation Age of Onset  . Breast cancer Mother   . Heart attack Father   . Prostate cancer Neg Hx   . Pancreatic cancer Neg Hx   . Colon cancer Neg Hx     SOCIAL HISTORY:  Social History   Socioeconomic History  . Marital status: Married    Spouse name: Not on file  . Number of children: Not on file  . Years of education: Not on file  . Highest education level: Not on file  Occupational History  . Occupation: retired Biochemist, clinical  Tobacco Use  . Smoking status: Former Smoker    Packs/day: 0.50    Years: 15.00    Pack years: 7.50    Types: Cigarettes    Quit date: 12/12/1987    Years since quitting: 32.3  . Smokeless tobacco: Never Used  Vaping Use  . Vaping Use: Never used   Substance and Sexual Activity  . Alcohol use: Yes    Comment: occasional beer  . Drug use: Never  . Sexual activity: Not Currently  Other Topics Concern  . Not on file  Social History Narrative  . Not on file   Social Determinants of Health   Financial Resource Strain:   . Difficulty of Paying Living Expenses: Not on file  Food Insecurity:   . Worried About Charity fundraiser in the Last Year: Not on file  . Ran Out of Food in the Last Year: Not on file  Transportation Needs:   . Lack of Transportation (Medical): Not on file  . Lack of Transportation (Non-Medical): Not on file  Physical Activity:   . Days of Exercise per Week: Not on file  . Minutes of Exercise per Session: Not on file  Stress:   . Feeling of Stress : Not on file  Social Connections:   . Frequency of Communication with Friends and Family: Not on file  . Frequency of Social Gatherings with Friends and Family: Not on file  . Attends Religious Services: Not on file  .  Active Member of Clubs or Organizations: Not on file  . Attends Archivist Meetings: Not on file  . Marital Status: Not on file  Intimate Partner Violence:   . Fear of Current or Ex-Partner: Not on file  . Emotionally Abused: Not on file  . Physically Abused: Not on file  . Sexually Abused: Not on file    ALLERGIES: Bee venom, Budesonide-formoterol fumarate, Poison oak extract, and Clindamycin/lincomycin  MEDICATIONS:  Current Outpatient Medications  Medication Sig Dispense Refill  . allopurinol (ZYLOPRIM) 300 MG tablet Take 300 mg by mouth at bedtime.     Marland Kitchen atorvastatin (LIPITOR) 40 MG tablet Take 40 mg by mouth at bedtime.     . diazepam (VALIUM) 5 MG tablet Take 2.5-5 mg by mouth every 12 (twelve) hours as needed (for eye twitching).     . hydrocortisone 2.5 % cream Apply 1 application topically 2 (two) times daily as needed (for hemorroidal flare ups).    . magnesium 30 MG tablet Take 30 mg by mouth 2 (two) times daily.     . Multiple Vitamin (MULTIVITAMIN) tablet Take 1 tablet by mouth daily.    . pantoprazole (PROTONIX) 40 MG tablet Take 40 mg by mouth 2 (two) times daily.    . pseudoephedrine-acetaminophen (TYLENOL SINUS) 30-500 MG TABS tablet Take 1-2 tablets by mouth every 4 (four) hours as needed (for sinus issues).    . Simethicone 180 MG CAPS Take 180 mg by mouth 3 (three) times daily as needed (for gas/indigestion.).    Marland Kitchen colchicine 0.6 MG tablet Take 2 tabs once followed by 1 tab an hour later for gout flare (total 1.8 mg dose/course). Do not repeat for at least 3 days. (Patient not taking: Reported on 04/27/2020)    . metoprolol succinate (TOPROL XL) 25 MG 24 hr tablet Take 1 tablet (25 mg total) by mouth daily. (Patient not taking: Reported on 04/27/2020) 90 tablet 3   No current facility-administered medications for this encounter.    REVIEW OF SYSTEMS:  On review of systems, the patient reports that he is doing well overall. He denies any chest pain, shortness of breath, productive cough, hemoptysis, fevers, chills, night sweats, or recent unintended weight changes. He denies any bladder disturbances, and denies abdominal pain, nausea or vomiting. He denies right hip pain and numbness or tingling in his right leg. He does note weakness and intermittent pain to his right knee, but he feels this is related to arthritis. He endorses using a walker, as directed by orthopedics for support and safety. He also reports intermittent sternal and left 2nd-3rd rib pain that is less overall but intense when he sneezes, which is relieved with a hot pack.  A complete review of systems is obtained and is otherwise negative.    PHYSICAL EXAM:  Wt Readings from Last 3 Encounters:  04/27/20 204 lb (92.5 kg)  04/16/20 204 lb 9.6 oz (92.8 kg)  04/12/20 206 lb 6.4 oz (93.6 kg)   Temp Readings from Last 3 Encounters:  04/12/20 (!) 97.5 F (36.4 C) (Tympanic)  03/01/20 98 F (36.7 C)  02/06/20 (!) 97.5 F (36.4 C)  (Temporal)   BP Readings from Last 3 Encounters:  04/12/20 139/88  03/16/20 (!) 134/92  03/01/20 129/83   Pulse Readings from Last 3 Encounters:  04/12/20 92  03/16/20 88  03/01/20 88   Pain Assessment Pain Score: 0-No pain/10  In general this is a well appearing Caucasian man in no acute distress. He's alert and  oriented x4 and appropriate throughout the examination. Cardiopulmonary assessment is negative for acute distress and he exhibits normal effort.    KPS = 100  100 - Normal; no complaints; no evidence of disease. 90   - Able to carry on normal activity; minor signs or symptoms of disease. 80   - Normal activity with effort; some signs or symptoms of disease. 34   - Cares for self; unable to carry on normal activity or to do active work. 60   - Requires occasional assistance, but is able to care for most of his personal needs. 50   - Requires considerable assistance and frequent medical care. 78   - Disabled; requires special care and assistance. 3   - Severely disabled; hospital admission is indicated although death not imminent. 34   - Very sick; hospital admission necessary; active supportive treatment necessary. 10   - Moribund; fatal processes progressing rapidly. 0     - Dead  Karnofsky DA, Abelmann [place], Craver LS and Burchenal JH [phone]) The use of the nitrogen mustards in the palliative treatment of carcinoma: with particular reference to bronchogenic carcinoma Cancer 1 [id number]  LABORATORY DATA:  Lab Results  Component Value Date   WBC 6.0 04/11/2020   HGB 13.3 04/11/2020   HCT 38.3 (L) 04/11/2020   MCV 92.1 04/11/2020   PLT 224 04/11/2020   Lab Results  Component Value Date   NA 140 04/11/2020   K 4.1 04/11/2020   CL 103 04/11/2020   CO2 30 04/11/2020   Lab Results  Component Value Date   ALT 16 04/11/2020   AST 18 04/11/2020   ALKPHOS 70 04/11/2020   BILITOT 0.7 04/11/2020     RADIOGRAPHY: CT Abdomen Pelvis W Wo Contrast  Result Date:  04/11/2020 CLINICAL DATA:  Right renal cell carcinoma with nephrectomy in 2019. metastasis diagnosed in June of 2021. Finished radiation therapy 4 weeks ago. Remote smoking history. EXAM: CT CHEST WITH CONTRAST CT ABDOMEN AND PELVIS WITH AND WITHOUT CONTRAST TECHNIQUE: Multidetector CT imaging of the chest was performed during intravenous contrast administration. Multidetector CT imaging of the abdomen and pelvis was performed following the standard protocol before and during bolus administration of intravenous contrast. CONTRAST:  34mL OMNIPAQUE IOHEXOL 350 MG/ML SOLN COMPARISON:  12/28/2019 from [facility] FINDINGS: CT CHEST FINDINGS Cardiovascular: Aortic atherosclerosis. Tortuous thoracic aorta. Normal heart size, without pericardial effusion. Multivessel coronary artery atherosclerosis. No central pulmonary embolism, on this non-dedicated study. Mediastinum/Nodes: No supraclavicular adenopathy. No mediastinal or hilar adenopathy. Lungs/Pleura: No pleural fluid.  Moderate centrilobular emphysema. Bilateral pulmonary nodules on the order of 3-4 mm. Many of these are similar and therefore likely benign. There are some nodules which are new today. Examples in the posterior upper lobes at 3 mm on 25/15 bilaterally. Within the anterior right upper lobe at 3 mm on 46/15. Within the left lower lobe, along the left major fissure at 3 mm on 47/15. A 2 mm right lower lobe nodule on 105/15 is likely new. Similar areas of scarring and interstitial thickening bilaterally. The left lower lobe pulmonary nodule of interest measures 12 x 9 mm on 116/15 versus 10 x 9 mm on the prior exam (when remeasured). Musculoskeletal: No acute osseous abnormality. CT ABDOMEN AND PELVIS FINDINGS Hepatobiliary: Normal liver. Normal gallbladder, without biliary ductal dilatation. Pancreas: Suspect pancreas divisum, with the prominent dorsal duct entering the duodenum on 47/6. No duct dilatation or acute inflammation. Spleen: Normal in  size, without focal abnormality. Adrenals/Urinary Tract: Left adrenal nodule  of 11 mm is similar and considered benign given stability. Right nephrectomy, without locally recurrent disease. No left renal calculi or hydronephrosis.  Normal urinary bladder. Stomach/Bowel: Underdistended gastric cardiac body. Transverse duodenal diverticulum. Otherwise normal small bowel. Extensive colonic diverticulosis. Normal terminal ileum and appendix. Vascular/Lymphatic: Aortic and branch vessel atherosclerosis. No abdominopelvic adenopathy. Reproductive: Mild prostatomegaly. Other: No significant free fluid. Musculoskeletal: Lytic lesion involving the right inferior pubic ramus with cortical destruction and soft tissue components. Grossly similar at 7.1 x 4.3 cm on 201/11. A new posterior right acetabular lytic lesion measures 3.1 x 3.3 cm on 183/11. Trace L4-5 anterolisthesis. IMPRESSION: CT CHEST IMPRESSION 1. Pulmonary metastasis are felt to be slightly progressive. The dominant left lower lobe nodule has minimally enlarged. There also multiple new tiny nodules which are technically indeterminate but suspicious for metastasis. 2. No thoracic adenopathy. 3. Aortic atherosclerosis (ICD10-I70.0), coronary artery atherosclerosis and emphysema (ICD10-J43.9). CT ABDOMEN AND PELVIS IMPRESSION 1. Progressive osseous metastasis. Although the right inferior pubic ramus lytic lesion is similar, a new right posterior acetabular lesion is seen, and may predispose the patient to pathologic fracture. 2. No evidence of soft tissue metastasis within the abdomen or pelvis. 3. Suspect pancreas divisum. Electronically Signed   By: Abigail Miyamoto M.D.   On: 04/11/2020 12:32   CT Chest W Contrast  Result Date: 04/11/2020 CLINICAL DATA:  Right renal cell carcinoma with nephrectomy in 2019. metastasis diagnosed in June of 2021. Finished radiation therapy 4 weeks ago. Remote smoking history. EXAM: CT CHEST WITH CONTRAST CT ABDOMEN AND PELVIS WITH  AND WITHOUT CONTRAST TECHNIQUE: Multidetector CT imaging of the chest was performed during intravenous contrast administration. Multidetector CT imaging of the abdomen and pelvis was performed following the standard protocol before and during bolus administration of intravenous contrast. CONTRAST:  64mL OMNIPAQUE IOHEXOL 350 MG/ML SOLN COMPARISON:  12/28/2019 from [facility] FINDINGS: CT CHEST FINDINGS Cardiovascular: Aortic atherosclerosis. Tortuous thoracic aorta. Normal heart size, without pericardial effusion. Multivessel coronary artery atherosclerosis. No central pulmonary embolism, on this non-dedicated study. Mediastinum/Nodes: No supraclavicular adenopathy. No mediastinal or hilar adenopathy. Lungs/Pleura: No pleural fluid.  Moderate centrilobular emphysema. Bilateral pulmonary nodules on the order of 3-4 mm. Many of these are similar and therefore likely benign. There are some nodules which are new today. Examples in the posterior upper lobes at 3 mm on 25/15 bilaterally. Within the anterior right upper lobe at 3 mm on 46/15. Within the left lower lobe, along the left major fissure at 3 mm on 47/15. A 2 mm right lower lobe nodule on 105/15 is likely new. Similar areas of scarring and interstitial thickening bilaterally. The left lower lobe pulmonary nodule of interest measures 12 x 9 mm on 116/15 versus 10 x 9 mm on the prior exam (when remeasured). Musculoskeletal: No acute osseous abnormality. CT ABDOMEN AND PELVIS FINDINGS Hepatobiliary: Normal liver. Normal gallbladder, without biliary ductal dilatation. Pancreas: Suspect pancreas divisum, with the prominent dorsal duct entering the duodenum on 47/6. No duct dilatation or acute inflammation. Spleen: Normal in size, without focal abnormality. Adrenals/Urinary Tract: Left adrenal nodule of 11 mm is similar and considered benign given stability. Right nephrectomy, without locally recurrent disease. No left renal calculi or hydronephrosis.  Normal  urinary bladder. Stomach/Bowel: Underdistended gastric cardiac body. Transverse duodenal diverticulum. Otherwise normal small bowel. Extensive colonic diverticulosis. Normal terminal ileum and appendix. Vascular/Lymphatic: Aortic and branch vessel atherosclerosis. No abdominopelvic adenopathy. Reproductive: Mild prostatomegaly. Other: No significant free fluid. Musculoskeletal: Lytic lesion involving the right inferior pubic ramus with cortical  destruction and soft tissue components. Grossly similar at 7.1 x 4.3 cm on 201/11. A new posterior right acetabular lytic lesion measures 3.1 x 3.3 cm on 183/11. Trace L4-5 anterolisthesis. IMPRESSION: CT CHEST IMPRESSION 1. Pulmonary metastasis are felt to be slightly progressive. The dominant left lower lobe nodule has minimally enlarged. There also multiple new tiny nodules which are technically indeterminate but suspicious for metastasis. 2. No thoracic adenopathy. 3. Aortic atherosclerosis (ICD10-I70.0), coronary artery atherosclerosis and emphysema (ICD10-J43.9). CT ABDOMEN AND PELVIS IMPRESSION 1. Progressive osseous metastasis. Although the right inferior pubic ramus lytic lesion is similar, a new right posterior acetabular lesion is seen, and may predispose the patient to pathologic fracture. 2. No evidence of soft tissue metastasis within the abdomen or pelvis. 3. Suspect pancreas divisum. Electronically Signed   By: Abigail Miyamoto M.D.   On: 04/11/2020 12:32   ECHOCARDIOGRAM COMPLETE  Result Date: 04/02/2020    ECHOCARDIOGRAM REPORT   Patient Name:   Briant CEASER EBELING Date of Exam: 04/02/2020 Medical Rec #:  [id number]         Height:       69.0 in Accession #:    [id number]        Weight:       207.0 lb Date of Birth:  [birth year]         BSA:          2.096 m Patient Age:    72 years          BP:           134/92 mmHg Patient Gender: M                 HR:           93 bpm. Exam Location:  [facility] Procedure: 2D Echo, 3D Echo, Cardiac Doppler, Color Doppler  and Strain Analysis Indications:    I48 Atrial fibrillation  History:        Patient has no prior history of Echocardiogram examinations.                 Signs/Symptoms:Murmur. Lung cancer. Bone cancer. Renal cancer.                 Anemia.  Sonographer:    Jessee Avers, RDCS Referring Phys: [id number] [place]  1. Global longitudinal strain is -19.2%. Left ventricular ejection fraction, by estimation, is 70 to 75%. The left ventricle has hyperdynamic function. The left ventricle has no regional wall motion abnormalities. Left ventricular diastolic parameters were normal.  2. Right ventricular systolic function is normal. The right ventricular size is normal.  3. AV is trileaflet. It is thickened, calcified with minimally restricted motion. Peak and mean gradients through the valve is 15 and 8 mm Hg respectively consistent with very mild AS.Marland Kitchen The aortic valve is abnormal. Aortic valve regurgitation is not visualized. Mild aortic valve stenosis.  4. Aortic dilatation noted. There is mild dilatation of the ascending aorta, measuring 38 mm.  5. The inferior vena cava is normal in size with greater than 50% respiratory variability, suggesting right atrial pressure of 3 mmHg. FINDINGS  Left Ventricle: Global longitudinal strain is -19.2%. Left ventricular ejection fraction, by estimation, is 70 to 75%. The left ventricle has hyperdynamic function. The left ventricle has no regional wall motion abnormalities. The left ventricular internal cavity size was normal in size. There is no left ventricular hypertrophy. Left ventricular diastolic parameters were normal. Right Ventricle: The right ventricular size  is normal. Right vetricular wall thickness was not assessed. Right ventricular systolic function is normal. Left Atrium: Left atrial size was normal in size. Right Atrium: Right atrial size was normal in size. Pericardium: Trivial pericardial effusion is present. Mitral Valve: The mitral valve is  abnormal. Mild mitral annular calcification. Trivial mitral valve regurgitation. Tricuspid Valve: The tricuspid valve is normal in structure. Tricuspid valve regurgitation is trivial. Aortic Valve: AV is trileaflet. It is thickened, calcified with minimally restricted motion. Peak and mean gradients through the valve is 15 and 8 mm Hg respectively consistent with very mild AS. The aortic valve is abnormal. Aortic valve regurgitation is not visualized. Mild aortic stenosis is present. Aortic valve mean gradient measures 8.5 mmHg. Aortic valve peak gradient measures 16.2 mmHg. Aortic valve area, by VTI measures 2.02 cm. Pulmonic Valve: The pulmonic valve was normal in structure. Pulmonic valve regurgitation is trivial. Aorta: The aortic root and ascending aorta are structurally normal, with no evidence of dilitation, the aortic root is normal in size and structure and aortic dilatation noted. There is mild dilatation of the ascending aorta, measuring 38 mm. Venous: The inferior vena cava is normal in size with greater than 50% respiratory variability, suggesting right atrial pressure of 3 mmHg. IAS/Shunts: No atrial level shunt detected by color flow Doppler.  LEFT VENTRICLE PLAX 2D LVIDd:         4.90 cm  Diastology LVIDs:         2.90 cm  LV e' medial:    8.51 cm/s LV PW:         1.00 cm  LV E/e' medial:  13.4 LV IVS:        1.00 cm  LV e' lateral:   8.35 cm/s LVOT diam:     2.00 cm  LV E/e' lateral: 13.7 LV SV:         77 LV SV Index:   37       2D Longitudinal Strain LVOT Area:     3.14 cm 2D Strain GLS (A2C):   -19.2 %                         2D Strain GLS (A3C):   -18.6 %                         2D Strain GLS (A4C):   -19.9 %                         2D Strain GLS Avg:     -19.2 %                          3D Volume EF:                         3D EF:        62 %                         LV EDV:       130 ml                         LV ESV:       50 ml  LV SV:        80 ml RIGHT VENTRICLE RV  Basal diam:  3.60 cm RV S prime:     16.10 cm/s TAPSE (M-mode): 2.4 cm LEFT ATRIUM             Index       RIGHT ATRIUM           Index LA diam:        3.60 cm 1.72 cm/m  RA Pressure: 3.00 mmHg LA Vol (A2C):   55.2 ml 26.33 ml/m RA Area:     13.10 cm LA Vol (A4C):   47.6 ml 22.71 ml/m RA Volume:   28.60 ml  13.64 ml/m LA Biplane Vol: 54.5 ml 26.00 ml/m  AORTIC VALVE AV Area (Vmax):    2.28 cm AV Area (Vmean):   1.73 cm AV Area (VTI):     2.02 cm AV Vmax:           201.00 cm/s AV Vmean:          132.500 cm/s AV VTI:            0.379 m AV Peak Grad:      16.2 mmHg AV Mean Grad:      8.5 mmHg LVOT Vmax:         146.00 cm/s LVOT Vmean:        73.000 cm/s LVOT VTI:          0.244 m LVOT/AV VTI ratio: 0.64  AORTA Ao Root diam: 4.00 cm Ao Asc diam:  3.80 cm MITRAL VALVE                TRICUSPID VALVE                             Estimated RAP:  3.00 mmHg  MV E velocity: 114.00 cm/s  SHUNTS MV A velocity: 58.70 cm/s   Systemic VTI:  0.24 m MV E/A ratio:  1.94         Systemic Diam: 2.00 cm Dorris Carnes MD Electronically signed by Dorris Carnes MD Signature Date/Time: 04/02/2020/12:33:19 PM    Final    LONG TERM MONITOR (3-14 DAYS)  Result Date: 04/23/2020 Enrollment 03/21/2020-03/28/2020 (7 days 1 hour). Patient had a min HR of 56 bpm (sinus bradycardia), max HR of 250 bpm (5 beats of supraventricular tachycardia; 1.7 second duration), and avg HR of 78 bpm (normal sinus rhythm). Predominant underlying rhythm was Sinus Rhythm. 2 non-sustained Ventricular Tachycardia runs occurred, the run with the fastest interval lasting 4 beats (1.5 seconds) with a max rate of 184 bpm (avg 171 bpm); the run with the fastest interval was also the longest. 112 Supraventricular Tachycardia runs occurred, the run with the fastest interval lasting 5 beats (1.7 second duration) with a max rate of 250 bpm, the longest lasting 6 beats (4.1 seconds) with an avg rate of 102 bpm. Some episodes of Supraventricular Tachycardia may be possible  Atrial Tachycardia with variable block. Isolated SVEs were frequent (8.7%, [id number]), SVE Couplets were rare (<1.0%, 486), and SVE Triplets were rare (<1.0%, 1954). Isolated VEs were rare (<1.0%), and no VE Couplets or VE Triplets were present. No diary submitted. Impression: 1. Brief atrial tachycardia episodes (112 episodes in 7 days; longest duration 6 beats). 2. No definite atrial fibrillation. 3. Frequent PACs (8.7% burden). [place] T. Audie Box, [place] [address] [place], [place] [address] [phone]  7:31 AM      IMPRESSION/PLAN: This visit was conducted via MyChart to spare the patient unnecessary potential exposure in the healthcare setting during the current COVID-19 pandemic. 1. 74 y.o. 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 We reviewed his imaging and discussed the natural history of renal cell carcinoma and general treatment, highlighting the role of radiotherapy in the management of oligometastatic disease. We reviewed the recommended radiation technique, which he is familiar with given his previous treatment several months ago.  The recommendation is to proceed with a 5 fraction course of stereotactic body radiation therapy focused on the right acetabular lesion.  We reviewed the anticipated acute and late sequelae associated with radiation in this setting. The patient was encouraged to ask questions that were answered to his stated satisfaction.  At the end of our conversation, the patient would like to proceed with the recommended 5 fraction course of stereotactic body radiotherapy focused on the lesion in the right acetabula.  He appears to have a good understanding of his disease and our treatment recommendations which are of curative intent.  He understands that there is a very low likelihood of cure but that  this treatment will certainly improve the probability of gaining more long-term, durable control of his disease, improving overall survival.  He has freely signed written consent to proceed today in the office and a copy of this document will be placed in his medical record.  He is scheduled for CT simulation/treatment planning later this afternoon at 2 pm with plans to begin his treatments in the near future.  We will share our discussion with Dr. Alen Blew and Dr. Redmond Pulling and look forward to continuing to participate in his care.   Given current concerns for patient exposure during the COVID-19 pandemic, this encounter was conducted via video-enabled WebEx visit. The patient has given verbal consent for this type of encounter. The time spent during this encounter was 30 minutes. The attendants for this meeting include Tyler Pita MD, Jearldine Cassady PA-C, [place], patient, Jama Krichbaum and his wife. During the encounter, Tyler Pita MD, Abriel Hattery PA-C, and scribe, Wilburn Mylar were located at [facility].  Patient, Nikolay Demetriou and his wife were located at home.     Nicholos Johns, PA-C    Tyler Pita, MD  [facility] Direct Dial: [phone]  Fax: [phone] [email or web address]  Skype  [email or web address]   This document serves as a record of services personally performed by Tyler Pita, MD and Freeman Caldron, PA-C. It was created on their behalf by Wilburn Mylar, a trained medical scribe. The creation of this record is based on the scribe's personal observations and the provider's statements to them. This document has been checked and approved by the attending provider.

## 2020-05-07 DIAGNOSIS — C7951 Secondary malignant neoplasm of bone: Secondary | ICD-10-CM | POA: Insufficient documentation

## 2020-05-07 DIAGNOSIS — C649 Malignant neoplasm of unspecified kidney, except renal pelvis: Secondary | ICD-10-CM | POA: Diagnosis present

## 2020-05-08 ENCOUNTER — Other Ambulatory Visit: Payer: Self-pay

## 2020-05-08 ENCOUNTER — Ambulatory Visit
Admission: RE | Admit: 2020-05-08 | Discharge: 2020-05-08 | Disposition: A | Discharge status: Home / Self Care | Payer: Medicare Other | Source: Ambulatory Visit | Attending: Radiation Oncology | Admitting: Radiation Oncology

## 2020-05-08 DIAGNOSIS — C7951 Secondary malignant neoplasm of bone: Secondary | ICD-10-CM

## 2020-05-08 DIAGNOSIS — C649 Malignant neoplasm of unspecified kidney, except renal pelvis: Secondary | ICD-10-CM

## 2020-05-09 ENCOUNTER — Ambulatory Visit: Payer: Medicare Other | Admitting: Radiation Oncology

## 2020-05-10 ENCOUNTER — Ambulatory Visit
Admission: RE | Admit: 2020-05-10 | Discharge: 2020-05-10 | Disposition: A | Discharge status: Home / Self Care | Payer: Medicare Other | Source: Ambulatory Visit | Attending: Radiation Oncology | Admitting: Radiation Oncology

## 2020-05-10 ENCOUNTER — Other Ambulatory Visit: Payer: Self-pay

## 2020-05-10 DIAGNOSIS — C7951 Secondary malignant neoplasm of bone: Secondary | ICD-10-CM | POA: Diagnosis not present

## 2020-05-10 DIAGNOSIS — C649 Malignant neoplasm of unspecified kidney, except renal pelvis: Secondary | ICD-10-CM

## 2020-05-11 ENCOUNTER — Ambulatory Visit: Payer: Medicare Other | Admitting: Radiation Oncology

## 2020-05-14 ENCOUNTER — Other Ambulatory Visit: Payer: Self-pay | Admitting: Oncology

## 2020-05-14 ENCOUNTER — Inpatient Hospital Stay: Payer: Medicare Other | Attending: Oncology | Admitting: Oncology

## 2020-05-14 ENCOUNTER — Ambulatory Visit
Admission: RE | Admit: 2020-05-14 | Discharge: 2020-05-14 | Disposition: A | Discharge status: Home / Self Care | Payer: Medicare Other | Source: Ambulatory Visit | Attending: Radiation Oncology | Admitting: Radiation Oncology

## 2020-05-14 ENCOUNTER — Other Ambulatory Visit: Payer: Self-pay

## 2020-05-14 VITALS — BP 140/73 | HR 81 | Temp 98.9°F | Resp 20 | Wt 201.5 lb

## 2020-05-14 DIAGNOSIS — C7951 Secondary malignant neoplasm of bone: Secondary | ICD-10-CM | POA: Insufficient documentation

## 2020-05-14 DIAGNOSIS — C641 Malignant neoplasm of right kidney, except renal pelvis: Secondary | ICD-10-CM | POA: Insufficient documentation

## 2020-05-14 DIAGNOSIS — C649 Malignant neoplasm of unspecified kidney, except renal pelvis: Secondary | ICD-10-CM

## 2020-05-14 DIAGNOSIS — Z79899 Other long term (current) drug therapy: Secondary | ICD-10-CM | POA: Insufficient documentation

## 2020-05-14 MED ORDER — CABOMETYX 40 MG PO TABS: 40.0000 mg | ORAL_TABLET | Freq: Every day | ORAL | 0 refills | Status: DC

## 2020-05-14 NOTE — Progress Notes (Signed)
Hematology and Oncology Follow Up Visit  James Massey [id number] 01/24/1946 74 y.o. 05/14/2020 9:01 AM James Rink, DO   Principle Diagnosis: 74 year old man with stage IV clear-cell renal cell carcinoma documented in July 2021.  He has known disease in the pelvis after localized kidney cancer diagnosed in November 2019.     Prior Therapy:  He is status post laparoscopic right radical nephrectomy on June 01, 2019 2019.  He is status post right hip and left lung radiation therapy completed in August 2021.  He received 44 Gray in 5 fractions  Current therapy: He is currently receiving radiation therapy to the right hip.  Therapy is scheduled to conclude on November 12.  Interim History: James Massey returns today for a follow-up visit.  Since her last visit, he was evaluated by Dr. Magda Bernheim at [facility] regarding his metastatic disease to the hip and currently receiving radiation therapy under the care of Dr. Tammi Klippel.  Clinically, he reports feeling reasonably well and ambulating with the help of walker without any falls or syncope.  His hip pain has been very minimal at this time.  He denies any nausea, vomiting or recent falls.    Medications: Updated on review. Current Outpatient Medications  Medication Sig Dispense Refill  . allopurinol (ZYLOPRIM) 300 MG tablet Take 300 mg by mouth at bedtime.     Marland Kitchen atorvastatin (LIPITOR) 40 MG tablet Take 40 mg by mouth at bedtime.     . colchicine 0.6 MG tablet Take 2 tabs once followed by 1 tab an hour later for gout flare (total 1.8 mg dose/course). Do not repeat for at least 3 days. (Patient not taking: Reported on 04/27/2020)    . diazepam (VALIUM) 5 MG tablet Take 2.5-5 mg by mouth every 12 (twelve) hours as needed (for eye twitching).     . hydrocortisone 2.5 % cream Apply 1 application topically 2 (two) times daily as needed (for hemorroidal flare ups).    . magnesium 30 MG tablet  Take 30 mg by mouth 2 (two) times daily.    . metoprolol succinate (TOPROL XL) 25 MG 24 hr tablet Take 1 tablet (25 mg total) by mouth daily. (Patient not taking: Reported on 04/27/2020) 90 tablet 3  . Multiple Vitamin (MULTIVITAMIN) tablet Take 1 tablet by mouth daily.    . pantoprazole (PROTONIX) 40 MG tablet Take 40 mg by mouth 2 (two) times daily.    . pseudoephedrine-acetaminophen (TYLENOL SINUS) 30-500 MG TABS tablet Take 1-2 tablets by mouth every 4 (four) hours as needed (for sinus issues).    . Simethicone 180 MG CAPS Take 180 mg by mouth 3 (three) times daily as needed (for gas/indigestion.).     No current facility-administered medications for this visit.     Allergies:  Allergies  Allergen Reactions  . Bee Venom Swelling  . Budesonide-Formoterol Fumarate Other (See Comments)    RESPIRATORY ISSUES  . Poison [employer] and ivy  . Clindamycin/Lincomycin Rash     Physical Exam: Blood pressure 140/73, pulse 81, temperature 98.9 F (37.2 C), temperature source Oral, resp. rate 20, weight 201 lb 8 oz (91.4 kg), SpO2 99 %.    ECOG: 1    General appearance: Comfortable appearing without any discomfort Head: Normocephalic without any trauma Oropharynx: Mucous membranes are moist and pink without any thrush or ulcers. Eyes: Pupils are equal and round reactive to light. Lymph nodes: No cervical, supraclavicular,  inguinal or axillary lymphadenopathy.   Heart:regular rate and rhythm.  S1 and S2 without leg edema. Lung: Clear without any rhonchi or wheezes.  No dullness to percussion. Abdomin: Soft, nontender, nondistended with good bowel sounds.  No hepatosplenomegaly. Musculoskeletal: No joint deformity or effusion.  Full range of motion noted. Neurological: No deficits noted on motor, sensory and deep tendon reflex exam. Skin: No petechial rash or dryness.  Appeared moist.         Lab Results: Lab Results  Component Value Date   WBC 6.0 04/11/2020    HGB 13.3 04/11/2020   HCT 38.3 (L) 04/11/2020   MCV 92.1 04/11/2020   PLT 224 04/11/2020     Chemistry      Component Value Date/Time   NA 140 04/11/2020 1010   K 4.1 04/11/2020 1010   CL 103 04/11/2020 1010   CO2 30 04/11/2020 1010   BUN 18 04/11/2020 1010   CREATININE 1.51 (H) 04/11/2020 1010      Component Value Date/Time   CALCIUM 9.9 04/11/2020 1010   ALKPHOS 70 04/11/2020 1010   AST 18 04/11/2020 1010   ALT 16 04/11/2020 1010   BILITOT 0.7 04/11/2020 1010       Impression and Plan:   74 year old man with:  1.    Stage IV renal cell carcinoma with clear-cell histology diagnosed to the right pelvis in July 2021.    The natural course of his disease was updated at this time and imaging studies April 11, 2020 were reiterated.  Risks and benefits of systemic therapy for his kidney cancer were discussed at this time.  Treatment options were reviewed.  These would include single agent oral targeted therapy or in combination with immunotherapy.  Combination with ipilimumab and nivolumab could also be a consideration.  Risks and benefits of all these approaches were discussed at this time.  After discussion today, we opted to proceed with single agent to cabozantinib first with potential addition of nivolumab subsequently.  Complication associated with this treatment, nausea, fatigue, diarrhea among other complications.  The plan is to start with 40 mg dosing after completing radiation therapy with a tentative start date on May 28, 2020.  2.  Right acetabular lesion: Currently receiving radiation therapy at this time.  He has tolerated well with pain under excellent control.     3.  Prognosis and goals of care: His disease is incurable although aggressive measures are warranted given his excellent performance status.  Any treatment is palliative.  4.  Diarrhea prophylaxis: We will discuss strategies to prevent diarrhea associated with Cabometyx.   5. Follow up:  In 1 month for repeat follow-up.   30  minutes were spent on this encounter.  Time was dedicated to reviewing his disease status, discussing treatment options and future plan of care review.   [name], MD 11/8/20219:01 AM

## 2020-05-16 ENCOUNTER — Other Ambulatory Visit: Payer: Self-pay

## 2020-05-16 ENCOUNTER — Ambulatory Visit
Admission: RE | Admit: 2020-05-16 | Discharge: 2020-05-16 | Disposition: A | Discharge status: Home / Self Care | Payer: Medicare Other | Source: Ambulatory Visit | Attending: Radiation Oncology | Admitting: Radiation Oncology

## 2020-05-16 ENCOUNTER — Telehealth: Payer: Self-pay

## 2020-05-16 ENCOUNTER — Telehealth: Payer: Self-pay | Admitting: Pharmacist

## 2020-05-16 DIAGNOSIS — C7951 Secondary malignant neoplasm of bone: Secondary | ICD-10-CM | POA: Diagnosis not present

## 2020-05-16 DIAGNOSIS — C649 Malignant neoplasm of unspecified kidney, except renal pelvis: Secondary | ICD-10-CM

## 2020-05-16 NOTE — Telephone Encounter (Signed)
Called patient's wife and let her know that patient needs to continue to see Dr. Redmond Pulling. She verbalized understanding.

## 2020-05-16 NOTE — Telephone Encounter (Signed)
-----   Message from [name], MD sent at 05/16/2020 10:33 AM EST ----- Yes. He needs to continue to see him and monitor his hip status moving forward.  Thanks ----- Message ----- From: Tami Lin, RN Sent: 05/16/2020  10:22 AM EST To: [name], MD  Patient saw Dr. Redmond Pulling at [facility] on 10/19. He is scheduled for a follow up and x ray on 11/30 with Dr. Redmond Pulling.  Patient's wife called and wants to know if patient needs to continue to see Dr. Redmond Pulling. She said patient was seen here on Monday and she wasn't sure if they need to continue to follow up with Dr. Redmond Pulling.  Lanelle Bal

## 2020-05-16 NOTE — Telephone Encounter (Signed)
Oral Oncology Patient Advocate Encounter  Received notification from [employer] that prior authorization for Cabometyx is required.  PA submitted on CoverMyMeds Key [id number] Status is pending  Oral Oncology Clinic will continue to follow.  [place] Patient [place] Phone [phone] Fax [phone] 05/16/2020 4:14 PM

## 2020-05-17 ENCOUNTER — Telehealth: Payer: Self-pay | Admitting: Radiation Oncology

## 2020-05-17 ENCOUNTER — Telehealth: Payer: Self-pay

## 2020-05-17 NOTE — Telephone Encounter (Signed)
Oral Oncology Patient Advocate Encounter  Prior Authorization for Cabometyx has been approved.    PA# [id number] Effective dates: 05/16/20 through 07/06/21  Patients co-pay is $3222  Oral Oncology Clinic will continue to follow.    [place] Patient [place] Phone [phone] Fax [phone] 05/17/2020 8:13 AM

## 2020-05-17 NOTE — Telephone Encounter (Signed)
Oral Oncology Patient Advocate Encounter  Was successful in securing patient a $10000 grant from [facility] to provide copayment coverage for Cabometyx.  This will keep the out of pocket expense at $0.     [employer] ID: [id number]  I have spoken with the patient.   The billing information is as follows and has been shared with [place].    RxBin: [id number] PCN: [id number] Member ID: [id number] Group ID: [id number] Dates of Eligibility: 04/16/20 through 04/16/21  Fund:  Renal Cell   [place] Patient [place] Phone [phone] Fax [phone] 05/17/2020 3:16 PM

## 2020-05-17 NOTE — Telephone Encounter (Signed)
Oral Oncology Patient Advocate Encounter   Was successful in securing patient an $3 grant from [facility] [place] [facility]) to provide copayment coverage for Cabometyx.  This will keep the out of pocket expense at $0.     I have spoken with the patient.    The billing information is as follows and has been shared with [place].   Member ID: [id number] Group ID: [id number] RxBin: [id number] Dates of Eligibility: 02/17/20 through 05/16/21  Fund:  Renal Cell  [place] Patient [place] Phone [phone] Fax [phone] 05/17/2020 3:14 PM

## 2020-05-17 NOTE — Telephone Encounter (Signed)
Oral Oncology Pharmacist Encounter  Received new prescription for Cabometyx (cabozantinib) for the treatment of stage IV clear-cell renal cell carcinoma, planned duration until disease progression or unacceptable drug toxicity.  Prescription dose and frequency assessed for appropriateness. Patient starting on Cabometyx 40 mg/d per MD. Patient may be considered for addition of immunotherapy at a later date.   CMP and CBC w/ Diff from 04/11/20 assessed, noted Scr of 1.51 (CrCl ~55 mL/min), no baseline dose adjustments required. Noted patient had TSH checked 02/06/20 and was WNL.   Current medication list in Epic reviewed, no relevant/significant DDIs with Cabometyx identified.  Evaluated chart and no patient barriers to medication adherence noted.   Prescription has been e-scribed to the [facility] for benefits analysis and approval.  Oral Oncology Clinic will continue to follow for insurance authorization, copayment issues, initial counseling and start date.  Leron Croak, PharmD, BCPS Hematology/Oncology Clinical Pharmacist [facility] [phone] 05/17/2020 8:05 AM

## 2020-05-17 NOTE — Telephone Encounter (Signed)
Received voicemail message from patient concerned he missed a call from this office. Phoned patient back to explain it was in fact Wynn Maudlin, CPhT attempting to reach him about a grant. Patient verbalized understanding and expressed he just hung up with her. Patient verbalized appreciation for the call back. Patient denies additional needs at this time.

## 2020-05-18 ENCOUNTER — Encounter: Payer: Self-pay | Admitting: Radiation Oncology

## 2020-05-18 ENCOUNTER — Encounter: Payer: Self-pay | Admitting: Urology

## 2020-05-18 ENCOUNTER — Ambulatory Visit
Admission: RE | Admit: 2020-05-18 | Discharge: 2020-05-18 | Disposition: A | Discharge status: Home / Self Care | Payer: Medicare Other | Source: Ambulatory Visit | Attending: Radiation Oncology | Admitting: Radiation Oncology

## 2020-05-18 DIAGNOSIS — C649 Malignant neoplasm of unspecified kidney, except renal pelvis: Secondary | ICD-10-CM

## 2020-05-18 DIAGNOSIS — C7951 Secondary malignant neoplasm of bone: Secondary | ICD-10-CM | POA: Diagnosis not present

## 2020-05-22 MED FILL — CABOMETYX 40 MG TABLET: 40 | 30 days supply | Qty: 30 | Fill #0

## 2020-05-22 NOTE — Telephone Encounter (Signed)
Oral Chemotherapy Pharmacist Encounter  I spoke with patient's wife for overview of: Cabometyx for the treatment of stage IV clear-cell renal cell carcinoma, planned duration until disease progression or unacceptable toxicity.   Counseled on administration, dosing, side effects, monitoring, drug-food interactions, safe handling, storage, and disposal.  Patient will take Cabometyx 40mg  tablets, 1 tablet (40mg ) by mouth once daily on an empty stomach, 1 hour before or 2 hours after a meal.  Patient knows to avoid grapefruit and grapefruit juice.  Cabometyx start date: 05/28/2020  Adverse effects include but are not limited to: diarrhea, nausea, decreased appetite, fatigue, hypertension, hand-foot syndrome, decreased blood counts, and electrolyte abnormalities. Patient will obtain anti diarrheal and alert the office of 4 or more loose stools above baseline.  Informed wife that Cabometyx should be held at least 3 weeks prior to any scheduled surgery (including dental surgery) and not resumed until at least 2 weeks after major surgery and until adequate wound healing is established.  Reviewed importance of keeping a medication schedule and plan for any missed doses. No barriers to medication adherence identified.  Medication reconciliation performed and medication/allergy list updated.  Insurance authorization for Cabometyx has been obtained. Test claim at the pharmacy revealed copayment $3222 for 1st fill of Cabometyx. Fatima Sanger was obtained for patient bringing out of pocket copay to $0. Patient and will will pick up Cabometyx from the [facility] [place] on 05/23/20.  Informed wife the pharmacy will reach out 5-7 days prior to needing next fill of Cabometyx to coordinate continued medication acquisition to prevent break in therapy.  All questions answered.  Ms. Navarrete voiced understanding and appreciation.   Medication education handout placed in mail for patient. Patient and wife  know to call the office with questions or concerns. Oral Chemotherapy Clinic phone number provided.   Leron Croak, PharmD, BCPS Hematology/Oncology Clinical Pharmacist [facility] [phone] 05/22/2020 2:06 PM

## 2020-06-16 ENCOUNTER — Other Ambulatory Visit: Payer: Self-pay | Admitting: Oncology

## 2020-06-18 ENCOUNTER — Other Ambulatory Visit: Payer: Self-pay | Admitting: Oncology

## 2020-06-19 ENCOUNTER — Other Ambulatory Visit: Payer: Self-pay

## 2020-06-19 ENCOUNTER — Inpatient Hospital Stay (HOSPITAL_BASED_OUTPATIENT_CLINIC_OR_DEPARTMENT_OTHER): Payer: Medicare Other | Admitting: Oncology

## 2020-06-19 ENCOUNTER — Inpatient Hospital Stay: Payer: Medicare Other | Attending: Oncology

## 2020-06-19 VITALS — BP 158/88 | HR 68 | Temp 97.9°F | Resp 18 | Ht 69.0 in | Wt 197.6 lb

## 2020-06-19 DIAGNOSIS — C7951 Secondary malignant neoplasm of bone: Secondary | ICD-10-CM | POA: Insufficient documentation

## 2020-06-19 DIAGNOSIS — C649 Malignant neoplasm of unspecified kidney, except renal pelvis: Secondary | ICD-10-CM | POA: Diagnosis not present

## 2020-06-19 DIAGNOSIS — C641 Malignant neoplasm of right kidney, except renal pelvis: Secondary | ICD-10-CM

## 2020-06-19 DIAGNOSIS — Z923 Personal history of irradiation: Secondary | ICD-10-CM | POA: Insufficient documentation

## 2020-06-19 DIAGNOSIS — Z79899 Other long term (current) drug therapy: Secondary | ICD-10-CM | POA: Diagnosis not present

## 2020-06-19 LAB — CMP (CANCER CENTER ONLY)
ALT: 72 U/L — ABNORMAL HIGH (ref 0–44)
AST: 46 U/L — ABNORMAL HIGH (ref 15–41)
Albumin: 2.8 g/dL — ABNORMAL LOW (ref 3.5–5.0)
Alkaline Phosphatase: 113 U/L (ref 38–126)
Anion gap: 8 (ref 5–15)
BUN: 13 mg/dL (ref 8–23)
CO2: 28 mmol/L (ref 22–32)
Calcium: 8.9 mg/dL (ref 8.9–10.3)
Chloride: 100 mmol/L (ref 98–111)
Creatinine: 1.26 mg/dL — ABNORMAL HIGH (ref 0.61–1.24)
GFR, Estimated: 60 mL/min — ABNORMAL LOW (ref >60)
Glucose, Bld: 110 mg/dL — ABNORMAL HIGH (ref 70–99)
Potassium: 4.4 mmol/L (ref 3.5–5.1)
Sodium: 136 mmol/L (ref 135–145)
Total Bilirubin: 0.6 mg/dL (ref 0.3–1.2)
Total Protein: 7 g/dL (ref 6.5–8.1)

## 2020-06-19 LAB — CBC WITH DIFFERENTIAL (CANCER CENTER ONLY)
Abs Immature Granulocytes: 0.02 10*3/uL (ref 0.00–0.07)
Basophils Absolute: 0 10*3/uL (ref 0.0–0.1)
Basophils Relative: 1 %
Eosinophils Absolute: 0.1 10*3/uL (ref 0.0–0.5)
Eosinophils Relative: 1 %
HCT: 40.3 % (ref 39.0–52.0)
Hemoglobin: 14.2 g/dL (ref 13.0–17.0)
Immature Granulocytes: 0 %
Lymphocytes Relative: 19 %
Lymphs Abs: 1.2 10*3/uL (ref 0.7–4.0)
MCH: 31.1 pg (ref 26.0–34.0)
MCHC: 35.2 g/dL (ref 30.0–36.0)
MCV: 88.4 fL (ref 80.0–100.0)
Monocytes Absolute: 0.5 10*3/uL (ref 0.1–1.0)
Monocytes Relative: 7 %
Neutro Abs: 4.5 10*3/uL (ref 1.7–7.7)
Neutrophils Relative %: 72 %
Platelet Count: 307 10*3/uL (ref 150–400)
RBC: 4.56 MIL/uL (ref 4.22–5.81)
RDW: 12.3 % (ref 11.5–15.5)
WBC Count: 6.3 10*3/uL (ref 4.0–10.5)
nRBC: 0 % (ref 0.0–0.2)

## 2020-06-19 MED ORDER — TRAMADOL HCL 50 MG PO TABS
50.0000 mg | ORAL_TABLET | Freq: Four times a day (QID) | ORAL | 0 refills | Status: DC | PRN
Start: 2020-06-19 — End: 2020-11-19

## 2020-06-19 MED FILL — CABOMETYX 40 MG TABLET: 40 | 30 days supply | Qty: 30 | Fill #0

## 2020-06-19 NOTE — Progress Notes (Signed)
Hematology and Oncology Follow Up Visit  James Massey [id number] 09/18/1945 74 y.o. 06/19/2020 11:45 AM James Bouchard [name], DO   Principle Diagnosis: 74 year old man with localized kidney disease diagnosed in 2019.  He developed stage IV clear-cell renal cell carcinoma documented in July 2021.     Prior Therapy:  He is status post laparoscopic right radical nephrectomy on June 01, 2019 2019.  He is status post right hip and left lung radiation therapy completed in August 2021.  He received 50 Gray in 5 fractions  He is status post radiation to the right posterior acetabular lesion completed in November 2021.  He received at 50 Gray in 5 fractions.  Current therapy: Cabometyx 40 mg daily started on 05/28/2020.  Interim History: James Massey is here for repeat evaluation.  Since the last visit, he started Cabometyx without any major complaints.  He denies any nausea, vomiting or abdominal pain.  He denies any worsening diarrhea or excessive fatigue.  He is reporting more pain in the right hip with some weakness associated with it.  He was evaluated by Dr. Redmond Pulling and recommended physical therapy.    Medications: Unchanged on review Current Outpatient Medications  Medication Sig Dispense Refill  . allopurinol (ZYLOPRIM) 300 MG tablet Take 300 mg by mouth at bedtime.     Marland Kitchen atorvastatin (LIPITOR) 40 MG tablet Take 40 mg by mouth at bedtime.     . CABOMETYX 40 MG tablet TAKE 1 TABLET (40 MG TOTAL) BY MOUTH DAILY. TAKE ON AN EMPTY STOMACH, 1 HOUR BEFORE OR 2 HOURS AFTER MEALS. 30 tablet 0  . colchicine 0.6 MG tablet Take 2 tabs once followed by 1 tab an hour later for gout flare (total 1.8 mg dose/course). Do not repeat for at least 3 days. (Patient not taking: Reported on 04/27/2020)    . diazepam (VALIUM) 5 MG tablet Take 2.5-5 mg by mouth every 12 (twelve) hours as needed (for eye twitching).     . hydrocortisone 2.5 % cream Apply 1 application topically 2 (two)  times daily as needed (for hemorroidal flare ups).    . magnesium 30 MG tablet Take 30 mg by mouth 2 (two) times daily.    . metoprolol succinate (TOPROL XL) 25 MG 24 hr tablet Take 1 tablet (25 mg total) by mouth daily. (Patient not taking: Reported on 04/27/2020) 90 tablet 3  . Multiple Vitamin (MULTIVITAMIN) tablet Take 1 tablet by mouth daily.    . pantoprazole (PROTONIX) 40 MG tablet Take 40 mg by mouth 2 (two) times daily.    . pseudoephedrine-acetaminophen (TYLENOL SINUS) 30-500 MG TABS tablet Take 1-2 tablets by mouth every 4 (four) hours as needed (for sinus issues).    . Simethicone 180 MG CAPS Take 180 mg by mouth 3 (three) times daily as needed (for gas/indigestion.).     No current facility-administered medications for this visit.     Allergies:  Allergies  Allergen Reactions  . Bee Venom Swelling  . Budesonide-Formoterol Fumarate Other (See Comments)    RESPIRATORY ISSUES  . Poison [employer] and ivy  . Clindamycin/Lincomycin Rash     Physical Exam:  Blood pressure (!) 158/88, pulse 68, temperature 97.9 F (36.6 C), temperature source Tympanic, resp. rate 18, height 5\' 9"  (1.753 m), weight 197 lb 9.6 oz (89.6 kg), SpO2 98 %.    ECOG: 1   General appearance: Alert, awake without any distress. Head: Atraumatic without abnormalities Oropharynx: Without any  thrush or ulcers. Eyes: No scleral icterus. Lymph nodes: No lymphadenopathy noted in the cervical, supraclavicular, or axillary nodes Heart:regular rate and rhythm, without any murmurs or gallops.   Lung: Clear to auscultation without any rhonchi, wheezes or dullness to percussion. Abdomin: Soft, nontender without any shifting dullness or ascites. Musculoskeletal: No clubbing or cyanosis. Neurological: No motor or sensory deficits. Skin: No rashes or lesions.         Lab Results: Lab Results  Component Value Date   WBC 6.3 06/19/2020   HGB 14.2 06/19/2020   HCT 40.3 06/19/2020    MCV 88.4 06/19/2020   PLT 307 06/19/2020     Chemistry      Component Value Date/Time   NA 140 04/11/2020 1010   K 4.1 04/11/2020 1010   CL 103 04/11/2020 1010   CO2 30 04/11/2020 1010   BUN 18 04/11/2020 1010   CREATININE 1.51 (H) 04/11/2020 1010      Component Value Date/Time   CALCIUM 9.9 04/11/2020 1010   ALKPHOS 70 04/11/2020 1010   AST 18 04/11/2020 1010   ALT 16 04/11/2020 1010   BILITOT 0.7 04/11/2020 1010       Impression and Plan:   74 year old man with:  1.    Kidney cancer diagnosed in 2019.  He subsequently developed stage IV clear-cell carcinoma with bone involvement.   He has started on Cabometyx in the last months without any major complications.  The natural course of this disease and alternative treatment options were reviewed.  Risks and benefits of continuing this treatment without any dose reduction were discussed.  Complications such as diarrhea, weight loss among others were reiterated.  He is agreeable to continuing the plan is to repeat imaging studies in 2 months.  2.  Right acetabular lesion: He has completed radiation therapy and currently under evaluation for possible surgical intervention.  He was evaluated by Dr. Redmond Pulling at [facility] and currently on active surveillance.  It was recommended that he proceed with physical therapy.     3.  Prognosis and goals of care: Therapy remains palliative although aggressive measures are warranted given his reasonable performance status.  4.  Diarrhea prophylaxis: I continue to emphasize importance of having antithyroidal medication.  No issues reported at this time.  5.  Pain: Prescription for tramadol will be available to him with instructions how to use it.   6. Follow up: In 4 weeks for repeat follow-up.   30  minutes were dedicated to this visit.  The time was spent on reviewing his disease status, discussing treatment options and future plan of care  review.   [name], MD 12/14/202111:45 AM

## 2020-06-20 ENCOUNTER — Telehealth: Payer: Self-pay

## 2020-06-20 NOTE — Telephone Encounter (Signed)
Attempted to call patient in regards to 1 month telephone follow-up with Freeman Caldron PA on 07/04/20 @9 :00am. Could not leave a message. Called to review meaningful use. TM

## 2020-07-02 ENCOUNTER — Telehealth: Payer: Self-pay

## 2020-07-02 ENCOUNTER — Encounter: Payer: Self-pay | Admitting: Urology

## 2020-07-02 NOTE — Telephone Encounter (Signed)
Spoke with spouse in regards to telephone visit appointment with Marcello Fennel PA on 07/04/20 @ 9:00am. Patient spouse verbalized understanding of appointment date and time. Reviewed meaningful use questions. TM

## 2020-07-04 ENCOUNTER — Ambulatory Visit
Admission: RE | Admit: 2020-07-04 | Discharge: 2020-07-04 | Disposition: A | Discharge status: Home / Self Care | Payer: Medicare Other | Source: Ambulatory Visit | Attending: Urology | Admitting: Urology

## 2020-07-04 ENCOUNTER — Other Ambulatory Visit: Payer: Self-pay

## 2020-07-04 DIAGNOSIS — C649 Malignant neoplasm of unspecified kidney, except renal pelvis: Secondary | ICD-10-CM

## 2020-07-04 NOTE — Progress Notes (Signed)
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  Interval Since Last Radiation:  6.5 weeks  05/08/20 - 05/18/20(SBRT): The target in the right acetabulum was treated to 50 Gy in 5 fractions of 10 Gy  02/20/20 - 03/01/20: Right posterior inferior pubic ramus  + LLL Lung nodule / 50 Gy in 5 fractions (SBRT)  Narrative:  I spoke with the patient to conduct his routine scheduled 1 month follow up visit via telephone to spare the patient unnecessary potential exposure in the healthcare setting during the current COVID-19 pandemic.  The patient was notified in advance and gave permission to proceed with this visit format.   He tolerated radiation treatment relatively well without any ill side effects.                              On review of systems, the patient states that he is doing relatively well but has been having progressive right hip pain over the past 3 to 4 weeks, particularly with changing positions and weightbearing activities.  He had a follow-up visit with Dr. Redmond Pulling at [facility] on 06/05/2020 and is scheduled to begin physical therapy next week to help regain strength and mobility.  He also met back with Dr. Alen Blew on 06/19/2020 and the current plan is to continue on the Carbometyx 40 mg daily (started 05/28/20) with plans for repeat imaging in February 2022.  Otherwise, he feels well and denies any ill effects from his recent radiotherapy.  ALLERGIES:  is allergic to bee venom, budesonide-formoterol fumarate, poison oak extract, and clindamycin/lincomycin.  Meds: Current Outpatient Medications  Medication Sig Dispense Refill  . allopurinol (ZYLOPRIM) 300 MG tablet Take 300 mg by  mouth at bedtime.     Marland Kitchen atorvastatin (LIPITOR) 40 MG tablet Take 40 mg by mouth at bedtime.     . CABOMETYX 40 MG tablet TAKE 1 TABLET (40 MG TOTAL) BY MOUTH DAILY. TAKE ON AN EMPTY STOMACH, 1 HOUR BEFORE OR 2 HOURS AFTER MEALS. 30 tablet 0  . diazepam (VALIUM) 5 MG tablet Take 2.5-5 mg by mouth every 12 (twelve) hours as needed (for eye twitching).     . hydrocortisone 2.5 % cream Apply 1 application topically 2 (two) times daily as needed (for hemorroidal flare ups).    . metoprolol succinate (TOPROL XL) 25 MG 24 hr tablet Take 1 tablet (25 mg total) by mouth daily. 90 tablet 3  . Multiple Vitamin (MULTIVITAMIN) tablet Take 1 tablet by mouth daily.    . pantoprazole (PROTONIX) 40 MG tablet Take 40 mg by mouth 2 (two) times daily.    . pseudoephedrine-acetaminophen (TYLENOL SINUS) 30-500 MG TABS tablet Take 1-2 tablets by mouth every 4 (four) hours as needed (for sinus issues).    . Simethicone 180 MG CAPS Take 180 mg by mouth 3 (three) times daily as needed (for gas/indigestion.).    Marland Kitchen traMADol (ULTRAM) 50 MG tablet Take 1 tablet (50 mg total) by mouth every 6 (six) hours as needed. 60 tablet 0  . colchicine 0.6 MG tablet Take 2 tabs once followed by 1 tab an hour later for gout flare (total  1.8 mg dose/course). Do not repeat for at least 3 days. (Patient not taking: No sig reported)    . magnesium 30 MG tablet Take 30 mg by mouth 2 (two) times daily. (Patient not taking: Reported on 07/02/2020)     No current facility-administered medications for this encounter.    Physical Findings:  vitals were not taken for this visit.   /Unable to assess due to telephone follow-up visit format.  Lab Findings: Lab Results  Component Value Date   WBC 6.3 06/19/2020   HGB 14.2 06/19/2020   HCT 40.3 06/19/2020   MCV 88.4 06/19/2020   PLT 307 06/19/2020     Radiographic Findings: No results found.  Impression/Plan: 51. 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 The patient appears to have recovered well from his recent SBRT.  Unfortunately, he has continued to have significant right hip pain but reports that x-rays of the right hip at the time of his recent follow-up with Dr. Redmond Pulling on 06/05/2020 indicated that his pain was most likely related to some osteoarthritis as the recently treated acetabular lesion appeared to be healing well.  He is scheduled to begin physical therapy next week and will have repeat systemic imaging in 2 months with Dr. Alen Blew and repeat hip radiographs with Dr. Redmond Pulling in February 2022.  We discussed that while we are happy to continue to participate in his care if clinically indicated, at this point, we will plan to see him back on an as-needed basis.  We enjoyed taking care of him and look forward to continuing to follow his progress via correspondence.  He knows that he is welcome to call at anytime with any questions or concerns related to his previous radiotherapy.    Nicholos Johns, PA-C

## 2020-07-04 NOTE — Progress Notes (Signed)
  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     Indication for treatment:  Curative, Definitive SBRT       Radiation treatment dates:   05/08/20 - 05/18/20  Site/dose:   The target in the right acetabulum was treated to 50 Gy in 5 fractions of 10 Gy  Beams/energy:   The patient was treated using stereotactic body radiotherapy according to a 3D conformal radiotherapy plan.  Volumetric arc fields were employed to deliver 6 MV X-rays.  Image guidance was performed with per fraction cone beam CT prior to treatment under personal MD supervision.  Immobilization was achieved using BodyFix Pillow.  Narrative: The patient tolerated radiation treatment relatively well without any ill side effects.  Plan: The patient has completed radiation treatment. The patient will return to radiation oncology clinic for routine followup in one month. I advised them to call or return sooner if they have any questions or concerns related to their recovery or treatment. ________________________________  Artist Pais. Kathrynn Running, M.D.

## 2020-07-08 NOTE — Progress Notes (Deleted)
Cardiology Office Note:   Date:  07/08/2020  NAME:  James Massey    MRN: [id number] DOB:  07/11/1945   PCP:  Nicola Girt, DO  Cardiologist:  No primary care provider on file.  Electrophysiologist:  None   Referring MD: Nicola Girt, DO   No chief complaint on file. ***  History of Present Illness:   James Massey is a 75 y.o. male with a hx of Stage IV RCC, paroxysmal Afib, PACs who presents for follow-up. Seen several months ago for evaluation after being in Afib at PCP office. Was in NSR in our office. Monitor showed PACs and no afib recurrence. AC deferred as CHADSVASC=1.    Problem List 1. 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  Paroxysmal atrial fibrillation -CHADSVASC = 1 -detected on EKG by PCP -monitor without Afib but frequent PACs 3. PACs -8.7% burden   Past Medical History: Past Medical History:  Diagnosis Date   Anxiety    Barrett esophagus    GERD (gastroesophageal reflux disease)    Gout    Heart murmur    History of anemia    Kidney cancer, primary, 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 to other site [facility])    Mixed hyperlipidemia    Renal cell cancer, right ([place])    Right renal mass    Wears glasses     Past Surgical History: Past Surgical History:  Procedure Laterality Date   CYSTOSCOPY WITH RETROGRADE PYELOGRAM, URETEROSCOPY AND STENT PLACEMENT Right 05/21/2018   Procedure: RIGHT RETROGRADE PYELOGRAM, RIGHT DIAGNOSTIC URETEROSCOPY AND STENT PLACEMENT;  Surgeon: Ardis Hughs, MD;  Location: [facility];  Service: Urology;  Laterality: Right;   INGUINAL HERNIA REPAIR Bilateral 09/2012   LAPAROSCOPIC NEPHRECTOMY, HAND ASSISTED Right 05/31/2018   Procedure: LAPAROSCOPIC RADICAL RIGHT NEPHRECTOMY;  Surgeon: Ardis Hughs, MD;  Location: [facility];  Service: Urology;  Laterality: Right;    Current Medications: No outpatient medications have been marked as taking for the 07/09/20 encounter (Appointment) with  O'Neal, Cassie Freer, MD.     Allergies:    Bee venom, Budesonide-formoterol fumarate, Poison oak extract, and Clindamycin/lincomycin   Social History: Social History   Socioeconomic History   Marital status: Married    Spouse name: Not on file   Number of children: Not on file   Years of education: Not on file   Highest education level: Not on file  Occupational History   Occupation: retired Press photographer rep  Tobacco Use   Smoking status: Former Smoker    Packs/day: 0.50    Years: 15.00    Pack years: 7.50    Types: Cigarettes    Quit date: 12/12/1987    Years since quitting: 32.5   Smokeless tobacco: Never Used  Vaping Use   Vaping Use: Never used  Substance and Sexual Activity   Alcohol use: Yes    Comment: occasional beer   Drug use: Never   Sexual activity: Not Currently  Other Topics Concern   Not on file  Social History Narrative   Not on file   Social Determinants of Health   Financial Resource Strain: Not on file  Food Insecurity: Not on file  Transportation Needs: Not on file  Physical Activity: Not on file  Stress: Not on file  Social Connections: Not on file     Family History: The patient's ***family history includes Breast cancer in his mother; Heart attack in his father. There is no history of Prostate cancer, Pancreatic cancer,  or Colon cancer.  ROS:   All other ROS reviewed and negative. Pertinent positives noted in the HPI.     EKGs/Labs/Other Studies Reviewed:   The following studies were personally reviewed by me today:  EKG:  EKG is *** ordered today.  The ekg ordered today demonstrates ***, and was personally reviewed by me.   TTE 04/02/2020 1. Global longitudinal strain is -19.2%. Left ventricular ejection  fraction, by estimation, is 70 to 75%. The left ventricle has hyperdynamic  function. The left ventricle has no regional wall motion abnormalities.  Left ventricular diastolic parameters  were normal.  2. Right  ventricular systolic function is normal. The right ventricular  size is normal.  3. AV is trileaflet. It is thickened, calcified with minimally restricted  motion. Peak and mean gradients through the valve is 15 and 8 mm Hg  respectively consistent with very mild AS.Marland Kitchen The aortic valve is abnormal.  Aortic valve regurgitation is not  visualized. Mild aortic valve stenosis.  4. Aortic dilatation noted. There is mild dilatation of the ascending  aorta, measuring 38 mm.  5. The inferior vena cava is normal in size with greater than 50%  respiratory variability, suggesting right atrial pressure of 3 mmHg.   Zio 04/23/2020  Impression: 1. Brief atrial tachycardia episodes (112 episodes in 7 days; longest duration 6 beats). 2. No definite atrial fibrillation.  3. Frequent PACs (8.7% burden).   Recent Labs: 02/06/2020: TSH 3.648 06/19/2020: ALT 72; BUN 13; Creatinine 1.26; Hemoglobin 14.2; Platelet Count 307; Potassium 4.4; Sodium 136   Recent Lipid Panel No results found for: CHOL, TRIG, HDL, CHOLHDL, VLDL, LDLCALC, LDLDIRECT  Physical Exam:   VS:  There were no vitals taken for this visit.   Wt Readings from Last 3 Encounters:  06/19/20 197 lb 9.6 oz (89.6 kg)  05/14/20 201 lb 8 oz (91.4 kg)  04/27/20 204 lb (92.5 kg)    General: Well nourished, well developed, in no acute distress Head: Atraumatic, normal size  Eyes: PEERLA, EOMI  Neck: Supple, no JVD Endocrine: No thryomegaly Cardiac: Normal S1, S2; RRR; no murmurs, rubs, or gallops Lungs: Clear to auscultation bilaterally, no wheezing, rhonchi or rales  Abd: Soft, nontender, no hepatomegaly  Ext: No edema, pulses 2+ Musculoskeletal: No deformities, BUE and BLE strength normal and equal Skin: Warm and dry, no rashes   Neuro: Alert and oriented to person, place, time, and situation, CNII-XII grossly intact, no focal deficits  Psych: Normal mood and affect   ASSESSMENT:   James Massey is a 75 y.o. male who presents  for the following: No diagnosis found.  PLAN:   There are no diagnoses linked to this encounter.  Disposition: No follow-ups on file.  Medication Adjustments/Labs and Tests Ordered: Current medicines are reviewed at length with the patient today.  Concerns regarding medicines are outlined above.  No orders of the defined types were placed in this encounter.  No orders of the defined types were placed in this encounter.   There are no Patient Instructions on file for this visit.   Time Spent with Patient: I have spent a total of *** minutes with patient reviewing hospital notes, telemetry, EKGs, labs and examining the patient as well as establishing an assessment and plan that was discussed with the patient.  > 50% of time was spent in direct patient care.  Signed, Lenna Gilford. Flora Lipps, MD [facility]  [address] [place], [place] [address] [phone]  07/08/2020 3:34 PM

## 2020-07-09 ENCOUNTER — Ambulatory Visit: Payer: Medicare Other | Admitting: Cardiovascular Disease

## 2020-07-09 DIAGNOSIS — I480 Paroxysmal atrial fibrillation: Secondary | ICD-10-CM

## 2020-07-09 DIAGNOSIS — I491 Atrial premature depolarization: Secondary | ICD-10-CM

## 2020-07-10 ENCOUNTER — Ambulatory Visit: Payer: Medicare Other | Attending: Orthopedic Surgery | Admitting: Physical Therapy

## 2020-07-10 ENCOUNTER — Other Ambulatory Visit: Payer: Self-pay

## 2020-07-10 DIAGNOSIS — M25651 Stiffness of right hip, not elsewhere classified: Secondary | ICD-10-CM | POA: Insufficient documentation

## 2020-07-10 DIAGNOSIS — R2681 Unsteadiness on feet: Secondary | ICD-10-CM

## 2020-07-10 DIAGNOSIS — M25551 Pain in right hip: Secondary | ICD-10-CM | POA: Diagnosis not present

## 2020-07-10 DIAGNOSIS — M25561 Pain in right knee: Secondary | ICD-10-CM | POA: Diagnosis present

## 2020-07-10 DIAGNOSIS — G8929 Other chronic pain: Secondary | ICD-10-CM | POA: Diagnosis present

## 2020-07-10 DIAGNOSIS — M6281 Muscle weakness (generalized): Secondary | ICD-10-CM | POA: Diagnosis present

## 2020-07-10 DIAGNOSIS — R262 Difficulty in walking, not elsewhere classified: Secondary | ICD-10-CM | POA: Insufficient documentation

## 2020-07-10 DIAGNOSIS — R2689 Other abnormalities of gait and mobility: Secondary | ICD-10-CM | POA: Diagnosis present

## 2020-07-10 NOTE — Therapy (Signed)
[facility] [address]  [place], [place], [address] Phone: [phone]   Fax:  [phone]  Physical Therapy Evaluation  Patient Details  Name: James Massey MRN: [id number] Date of Birth: 1946/02/28 Referring Provider (PT): Marygrace Drought, MD   Encounter Date: 07/10/2020   PT End of Session - 07/10/20 1307    Visit Number 1    Number of Visits 16    Date for PT Re-Evaluation 09/04/20    Authorization Type Medicare & BCBS    PT Start Time 1307    PT Stop Time 1433    PT Time Calculation (min) 86 min    Equipment Utilized During Treatment Gait belt    Activity Tolerance Patient tolerated treatment well;Patient limited by pain;Patient limited by fatigue    Behavior During Therapy [facility] for tasks assessed/performed           Past Medical History:  Diagnosis Date  . Anxiety   . Barrett esophagus   . GERD (gastroesophageal reflux disease)   . Gout   . Heart murmur   . History of anemia   . Kidney cancer, primary, 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 to other site [facility])   . Mixed hyperlipidemia   . Renal cell cancer, right ([place])   . Right renal mass   . Wears glasses     Past Surgical History:  Procedure Laterality Date  . CYSTOSCOPY WITH RETROGRADE PYELOGRAM, URETEROSCOPY AND STENT PLACEMENT Right 05/21/2018   Procedure: RIGHT RETROGRADE PYELOGRAM, RIGHT DIAGNOSTIC URETEROSCOPY AND STENT PLACEMENT;  Surgeon: Ardis Hughs, MD;  Location: [facility];  Service: Urology;  Laterality: Right;  . INGUINAL HERNIA REPAIR Bilateral 09/2012  . LAPAROSCOPIC NEPHRECTOMY, HAND ASSISTED Right 05/31/2018   Procedure: LAPAROSCOPIC RADICAL RIGHT NEPHRECTOMY;  Surgeon: Ardis Hughs, MD;  Location: [facility];  Service: Urology;  Laterality: Right;    There were no vitals filed for this visit.    Subjective Assessment - 07/10/20 1312    Subjective ~1 yr ago he developed pain in R hip and knee. MD diagnosed OA and  offered cortisone injection but only got relief for 24-48 hr. Cannot take NSAIDs due to only 1 kidney (nephrectomy d/t cancer). Cancer came back this summer after 39-month remission with new metastatic lesions including 2 on pelvic bone and then 1 later in R acetabulum. S/p radiation with evidence of healing but cautioned that bone appears brittle. Originally recommended THA but feels that since hip is healing s/p radiation, surgery not indicated at this time. Currently taking anti-cancer drug to prevent further spread. Prior to this he was ambulatory with a SPC but over past ~6 weeks he has been declining in bed mobility and transfers as well as gait and currently use a RW or SW for all mobility.    Patient is accompained by: Family member   wife   Limitations Sitting;Standing;Walking    How long can you sit comfortably? 3 hrs    How long can you stand comfortably? 5 minutes    Diagnostic tests 04/11/20 - CT ABDOMEN AND PELVIS IMPRESSION     1. Progressive osseous metastasis. Although the right inferior pubic  ramus lytic lesion is similar, a new right posterior acetabular  lesion is seen, and may predispose the patient to pathologic  fracture.  2. No evidence of soft tissue metastasis within the abdomen or  pelvis.  3. Suspect pancreas divisum.    Patient Stated Goals "to be able to walk w/o  having to use the walker & eliminate at least 3/4ths of the pain"    Currently in Pain? Yes    Pain Score 0-No pain   up to 7-8/10 with mobility, but typically short-lived   Pain Location Hip    Pain Orientation Right    Pain Descriptors / Indicators Sharp    Pain Type Acute pain;Chronic pain    Pain Radiating Towards occasional radiation toward knee or shin    Pain Onset More than a month ago    Pain Frequency Intermittent    Aggravating Factors  sit <> stand trasitions, lateral movement at hip, getting in/out of bed    Pain Relieving Factors rest, walking    Effect of Pain on Daily Activities limited with  transitional movements; cannot walk up/down stairs (has to use chair lift), unable to drive    Pain Score 0   4-7/10 with movement   Pain Location Knee    Pain Orientation Right;Anterior    Pain Descriptors / Indicators Sharp    Pain Type Acute pain;Chronic pain    Pain Onset More than a month ago    Pain Frequency Intermittent              [facility] PT Assessment - 07/10/20 1307      Assessment   Medical Diagnosis R hip OA/pain    Referring Provider (PT) Marygrace Drought, MD    Onset Date/Surgical Date --   ~1 yr   Hand Dominance Right    Next MD Visit 08/07/20    Prior Therapy none for current problem; remote h/o PT for B wrist sprains s/p fall on ice      Precautions   Precautions Fall    Precaution Comments h/o metastatic lesions in R acetabulum & pelvis      Restrictions   Weight Bearing Restrictions Yes    RLE Weight Bearing Weight bearing as tolerated      Balance Screen   Has the patient fallen in the past 6 months No    Has the patient had a decrease in activity level because of a fear of falling?  Yes    Is the patient reluctant to leave their home because of a fear of falling?  Yes      [place] Private residence    Living Arrangements Spouse/significant other    Available Help at Discharge Family    Type of [place] to enter    Entrance Stairs-Number of Steps 1 + 1   for preferred entrance   [place] Two level;Bed/bath upstairs;Full bath on main level    [place] - 2 wheels;Walker - standard;Cane - single point;Shower seat;Grab bars - tub/shower;Toilet riser   chair lift for stairs     Prior Function   Level of Independence Independent   recently needs assist with transfers & in/out of car   Vocation Retired    [employer] settling a relative estate    Leisure play pool, mostly sedentary      Cognition   Overall Cognitive Status Within Functional  Limits for tasks assessed      Posture/Postural Control   Posture/Postural Control Postural limitations    Postural Limitations Rounded Shoulders;Flexed trunk      ROM / Strength   AROM / PROM / Strength AROM;Strength      AROM   Overall AROM Comments R hip limited and  painful, esp ER, ABD & ADD      Strength   Strength Assessment Site Hip;Knee;Ankle    Right/Left Hip Right;Left    Right Hip Flexion 2+/5    Right Hip Extension 3-/5    Right Hip External Rotation  3-/5    Right Hip Internal Rotation 3-/5    Right Hip ABduction 3+/5    Right Hip ADduction 3+/5    Left Hip Flexion 3+/5   pain in R hip   Left Hip Extension 4-/5    Left Hip External Rotation 4-/5    Left Hip Internal Rotation 4-/5    Left Hip ABduction 4-/5    Left Hip ADduction 4-/5    Right/Left Knee Right;Left    Right Knee Flexion 4+/5    Right Knee Extension 4+/5    Left Knee Flexion 4/5    Left Knee Extension 4-/5    Right/Left Ankle Right;Left    Right Ankle Dorsiflexion 4/5   pain at hip with effort   Left Ankle Dorsiflexion 4+/5      Transfers   Transfers Sit to Stand;Stand to Sit    Sit to Stand 5: Supervision;4: Min guard;From elevated surface;With upper extremity assist;With armrests    Sit to Stand Details Verbal cues for precautions/safety;Verbal cues for technique    Sit to Stand Details (indicate cue type and reason) cues to push up from seat rather than pull up on RW    Stand to Sit 5: Supervision;4: Min guard;With upper extremity assist;To bed;To chair/3-in-1;With armrests    Stand to Sit Details (indicate cue type and reason) Verbal cues for precautions/safety;Verbal cues for technique    Stand to Sit Details cues to release RW/SW and reach back for seat to control descent      Ambulation/Gait   Ambulation/Gait Yes    Ambulation/Gait Assistance 5: Supervision;4: Min guard    Assistive device Rolling walker    Gait Pattern Step-through pattern;Antalgic;Decreased weight shift to  right;Decreased stance time - right;Decreased step length - right;Decreased step length - left;Decreased stride length;Decreased hip/knee flexion - left;Poor foot clearance - left;Poor foot clearance - right   decreased heel strike bilaterally   Ambulation Surface Level;Indoor    Gait velocity 1.69 ft/sec      Standardized Balance Assessment   Standardized Balance Assessment Berg Balance Test;Timed Up and Go Test;10 meter walk test    10 Meter Walk 19.37 sec with RW      Berg Balance Test   Sit to Stand Able to stand  independently using hands    Standing Unsupported Able to stand 2 minutes with supervision    Sitting with Back Unsupported but Feet Supported on Floor or Stool Able to sit safely and securely 2 minutes    Stand to Sit Controls descent by using hands    Transfers Able to transfer with verbal cueing and /or supervision    Standing Unsupported with Eyes Closed Able to stand 10 seconds with supervision    Standing Unsupported with Feet Together Able to place feet together independently and stand for 1 minute with supervision    From Standing, Reach Forward with Outstretched Arm Can reach forward >5 cm safely (2")    From Standing Position, Pick up Object from Floor Unable to try/needs assist to keep balance    From Standing Position, Turn to Look Behind Over each Shoulder Needs supervision when turning    Turn 360 Degrees Needs close supervision or verbal cueing    Standing Unsupported, Alternately Place  Feet on Step/Stool Needs assistance to keep from falling or unable to try    Standing Unsupported, One Foot in Front Able to take small step independently and hold 30 seconds    Standing on One Leg Able to lift leg independently and hold equal to or more than 3 seconds    Total Score 29    Berg comment: <36 = High risk for falls (close to 100%)      Timed Up and Go Test   Normal TUG (seconds) 37.69   with RW                     Objective measurements completed  on examination: See above findings.                 PT Short Term Goals - 07/10/20 1433      PT SHORT TERM GOAL #1   Title Patient will be independent with initial HEP    Status New    Target Date 07/31/20      PT SHORT TERM GOAL #2   Title Patient will demonstrate safe transfer technique and proper gait pattern with 4-wheel rolling walker or LRAD as indicated    Status New    Target Date 08/07/20      PT SHORT TERM GOAL #3   Title Patient will increase gait speed to >/= 2.0 ft/sec with RW or LRAD to decrease risk for recurrent falls    Status New    Target Date 08/07/20      PT SHORT TERM GOAL #4   Title Patient will demonstrate decreased TUG time to </= 28 sec with RW or LRAD to decrease risk for falls with transitional mobility    Status New    Target Date 08/07/20             PT Long Term Goals - 07/10/20 1433      PT LONG TERM GOAL #1   Title Patient will be independent with ongoing/advanced HEP for self-management at home    Status New    Target Date 09/04/20      PT LONG TERM GOAL #2   Title Decrease R hip and knee pain by >/= 50-75% during transitional movements allowing patient increased ease of bed mobiliy and transfers    Status New    Target Date 09/04/20      PT LONG TERM GOAL #3   Title Patient to improve R hip AROM to [facility] without pain provocation    Status New    Target Date 09/04/20      PT LONG TERM GOAL #4   Title Patient will demonstrate improved B LE strength to >/= 4/5 for improved stability and ease of mobility    Status New    Target Date 09/04/20      PT LONG TERM GOAL #5   Title Patient will increase gait speed to >/= 2.62 ft/sec with SPC or LRAD to increase safety with community ambulation    Status New    Target Date 09/04/20      PT LONG TERM GOAL #6   Title Patient will demonstrate decreased TUG time to </= 18 sec with SPC or LRAD to decrease risk for falls with transitional mobility    Status New    Target Date  09/04/20      PT LONG TERM GOAL #7   Title Patient will improve Berg score to >/= 46/56 to improve safety stability with ADLs in  standing and reduce risk for falls    Status New    Target Date 09/04/20                  Plan - 07/10/20 1433    Clinical Impression Statement James Massey is a 75 y/o male who presents to OP PT for R hip and knee pain with decreasing mobility and balance related to OA and metastatic lesion to R acetabulum from metastatic renal cell carcinoma. Pain originated ~1 yr ago and was originally attributed to OA but then last summer he was found to have metastatic return of his renal cancer which had previously been in remission. He has completed radiation therapy to R hip with evidence of healing present and THA has been deferred for now. Current deficits include R hip and knee pain with bed mobility and transitional movements (minimal to no pain at rest), limited and painful R hip AROM, R>L LE weakness, antalgic gait with abnormal gait pattern and decreased gait speed to 1.69 ft/sec, and impaired balance with high fall risk as evidenced by standardized balance testing with Berg (29/56) and TUG (37.69 sec with RW). James Massey will benefit from skilled PT to address above deficits to reduce or eliminate R hip and knee pain, restore functional R hip ROM and LE strength, and increase balance to improve transitional mobility and walking/activity tolerance.    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); R nephrectomy 2 cancer; OA; gout; GERD    Examination-Activity Limitations Bathing;Bed Mobility;Bend;Caring for Others;Lift;Carry;Locomotion Level;Sit;Sleep;Squat;Stairs;Stand;Toileting;Transfers    Examination-Participation Restrictions Community Activity;Driving;Interpersonal Relationship;Shop    Stability/Clinical  Decision Making Evolving/Moderate complexity    Clinical Decision Making Moderate    Rehab Potential Good    PT Frequency 2x / week    PT Duration 8 weeks    PT Treatment/Interventions ADLs/Self Care Home Management;Cryotherapy;Moist Heat;DME Instruction;Gait training;Stair training;Functional mobility training;Therapeutic activities;Therapeutic exercise;Balance training;Neuromuscular re-education;Patient/family education;Manual techniques;Passive range of motion;Dry needling;Taping;Vasopneumatic Device    PT Next Visit Plan Hip FOTO; create initial HEP for gentle R hip ROM and LE strengthening; review safe transfers with RW; gait training to normalize gait pattern with RW    Consulted and Agree with Plan of Care Patient;Family member/caregiver    Family Member Consulted wife           Patient will benefit from skilled therapeutic intervention in order to improve the following deficits and impairments:  Abnormal gait,Decreased activity tolerance,Decreased balance,Decreased endurance,Decreased knowledge of precautions,Decreased knowledge of use of DME,Decreased mobility,Decreased range of motion,Decreased safety awareness,Decreased strength,Difficulty walking,Increased fascial restricitons,Increased muscle spasms,Impaired perceived functional ability,Impaired flexibility,Improper body mechanics,Postural dysfunction,Pain  Visit Diagnosis: Pain in right hip  Stiffness of right hip, not elsewhere classified  Chronic pain of right knee  Unsteadiness on feet  Other abnormalities of gait and mobility  Difficulty in walking, not elsewhere classified  Muscle weakness (generalized)     Problem List Patient Active Problem List   Diagnosis Date Noted  . 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 Renal mass, left 05/31/2018    Percival Spanish, PT, MPT 07/10/2020, 4:54 PM  [facility] [address]  [place], [place], [address] Phone: [phone]   Fax:  [phone]  Name: James Massey MRN: [id number] Date of Birth: [birth year]

## 2020-07-11 ENCOUNTER — Other Ambulatory Visit: Payer: Self-pay | Admitting: Oncology

## 2020-07-15 NOTE — Progress Notes (Signed)
Virtual Visit via Video Note   This visit type was conducted due to national recommendations for restrictions regarding the COVID-19 Pandemic (e.g. social distancing) in an effort to limit this patient's exposure and mitigate transmission in our community.  Due to his co-morbid illnesses, this patient is at least at moderate risk for complications without adequate follow up.  This format is felt to be most appropriate for this patient at this time.  All issues noted in this document were discussed and addressed.  A limited physical exam was performed with this format.  Please refer to the patient's chart for his consent to telehealth for [facility] [place].  Date:  07/18/2020   ID:  James Massey, DOB July 19, 1945, MRN [id number] The patient was identified using 2 identifiers.  Patient Location: Home Provider Location: Office/Clinic  PCP:  Nicola Girt, DO  Cardiologist:  No primary care provider on file.    Evaluation Performed:  Follow-Up Visit  Chief Complaint: Atrial fibrillation  History of Present Illness:   James Massey is a 75 y.o. male with a hx of pAF and metastatic renal cancer who presents for follow-up. Seen in September after in Afib at PCP. Was in NSR at our visit. Monitor without Afib. AC not indicated. Frequent PACs on monitor. He reports he is doing well and not having any rapid heartbeat sensation.  Blood pressure has been elevated in the 150s at home.  This is new for him.  He is on cabozantinib which is known to have a side effect of high blood pressure and 30 to 60% of patients.  We did discuss that he needs to go on medication for this.  I also asked him to reach out to his oncologist to determine if there is another medication he can take.  His wife reports he had an episode a few weeks ago where he was difficult to arouse.  She thought he was having a stroke.  He was rather sleepy.  No further episodes of this.  No rapid heartbeat sensation.  We discussed the  cardia mobile device as well as the apple watch.  I think he should look into this to monitor his heart rhythm periodically.  We also discussed that anticoagulation will be indicated when he turns 50 in May.  He reports he would like to hold on any blood thinners at this time.  He was told he is anemic 25 years ago but this was due to frequent blood donation.  He is not anemic on most recent lab values.  He denies any chest pain, shortness of breath or palpitations.  He is also been severely debilitated due to arthritis.  He has chronic pain issues and is not that active.  His wife reports that he is now using a walker.  This is a change for him since her last visit.  He is going to start working with physical therapy to improve his symptoms.  His oncologist does not believe this is related to his cancer.  Problem List 1. 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  Paroxysmal atrial fibrillation -CHADSVASC = 1 (age)  The patient does not have symptoms concerning for COVID-19 infection (fever, chills, cough, or new shortness of breath).   Past Medical History:  Diagnosis Date  . Anxiety   . Barrett esophagus   . GERD (gastroesophageal reflux disease)   . Gout   . Heart murmur   . History of anemia   . Kidney cancer,  primary, 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 to other site [facility])   . Mixed hyperlipidemia   . Renal cell cancer, right ([place])   . Right renal mass   . Wears glasses    Past Surgical History:  Procedure Laterality Date  . CYSTOSCOPY WITH RETROGRADE PYELOGRAM, URETEROSCOPY AND STENT PLACEMENT Right 05/21/2018   Procedure: RIGHT RETROGRADE PYELOGRAM, RIGHT DIAGNOSTIC URETEROSCOPY AND STENT PLACEMENT;  Surgeon: Ardis Hughs, MD;  [facility];  Service: Urology;  Laterality: Right;  . INGUINAL HERNIA REPAIR Bilateral 09/2012  . LAPAROSCOPIC NEPHRECTOMY, HAND ASSISTED Right 05/31/2018   Procedure: LAPAROSCOPIC RADICAL RIGHT NEPHRECTOMY;  Surgeon: Ardis Hughs,  MD;  [facility];  Service: Urology;  Laterality: Right;     Current Meds  Medication Sig  . amLODipine (NORVASC) 10 MG tablet Take 1 tablet (10 mg total) by mouth daily.     Allergies:   Bee venom, Budesonide-formoterol fumarate, Poison oak extract, and Clindamycin/lincomycin   Social History   Tobacco Use  . Smoking status: Former Smoker    Packs/day: 0.50    Years: 15.00    Pack years: 7.50    Types: Cigarettes    Quit date: 12/12/1987    Years since quitting: 32.6  . Smokeless tobacco: Never Used  Vaping Use  . Vaping Use: Never used  Substance Use Topics  . Alcohol use: Yes    Comment: occasional beer  . Drug use: Never     Family Hx: The patient's family history includes Breast cancer in his mother; Heart attack in his father. There is no history of Prostate cancer, Pancreatic cancer, or Colon cancer.  ROS:   Please see the history of present illness.     All other systems reviewed and are negative.   Prior CV studies:   The following studies were reviewed today:  [facility] 04/23/2020 Impression: 1. Brief atrial tachycardia episodes (112 episodes in 7 days; longest duration 6 beats). 2. No definite atrial fibrillation.  3. Frequent PACs (8.7% burden).  TTE 04/02/2020 1. Global longitudinal strain is -19.2%. Left ventricular ejection  fraction, by estimation, is 70 to 75%. The left ventricle has hyperdynamic  function. The left ventricle has no regional wall motion abnormalities.  Left ventricular diastolic parameters  were normal.  2. Right ventricular systolic function is normal. The right ventricular  size is normal.  3. AV is trileaflet. It is thickened, calcified with minimally restricted  motion. Peak and mean gradients through the valve is 15 and 8 mm Hg  respectively consistent with very mild AS.Marland Kitchen The aortic valve is abnormal.  Aortic valve regurgitation is not  visualized. Mild aortic valve stenosis.  4. Aortic dilatation noted. There is mild  dilatation of the ascending  aorta, measuring 38 mm.  5. The inferior vena cava is normal in size with greater than 50%  respiratory variability, suggesting right atrial pressure of 3 mmHg.   Labs/Other Tests and Data Reviewed:    EKG:  No ECG reviewed.  Recent Labs: 02/06/2020: TSH 3.648 06/19/2020: ALT 72; BUN 13; Creatinine 1.26; Hemoglobin 14.2; Platelet Count 307; Potassium 4.4; Sodium 136   Recent Lipid Panel No results found for: CHOL, TRIG, HDL, CHOLHDL, LDLCALC, LDLDIRECT  Wt Readings from Last 3 Encounters:  06/19/20 197 lb 9.6 oz (89.6 kg)  05/14/20 201 lb 8 oz (91.4 kg)  04/27/20 204 lb (92.5 kg)    Objective:    Vital Signs:  There were no vitals taken for this visit.   VITAL SIGNS:  reviewed  General: No acute distress by video visit Pulmonary: Breathing comfortably and talking in full sentences Psych: Normal mood and affect  ASSESSMENT & PLAN:   1. Paroxysmal A-fib ([place]) 2. PAC (premature atrial contraction) -One time episode of paroxysmal atrial fibrillation at his primary care physician office.  Recent monitor without episodes of A. fib.  He reports no palpitations or dizziness.  I have recommended he obtain a cardia mobile device or an apple watch.  He needs to monitor his heart rhythm periodically. -CHADSVASC=1.  Anticoagulation not indicated.  When he turns 39 in May will be indicated.  He would like to hold on this for now.  We will discuss further when I see him back in 6 months.  3. Other secondary hypertension -He is having elevated blood pressures related to his cabozantinib.  This medication has a 30 to 60% chance of hypertensive episodes.  We will stop his metoprolol and start him on amlodipine 10 mg a day.  He should also discuss with oncology should he change medication.  COVID-19 Education: The signs and symptoms of COVID-19 were discussed with the patient and how to seek care for testing (follow up with PCP or arrange E-visit). The importance of  social distancing was discussed today.  Time:  Today, I have spent 25 minutes with the patient with telehealth technology discussing the above problems.    Medication Adjustments/Labs and Tests Ordered: Current medicines are reviewed at length with the patient today.  Concerns regarding medicines are outlined above.   Tests Ordered: No orders of the defined types were placed in this encounter.  Medication Changes: Meds ordered this encounter  Medications  . amLODipine (NORVASC) 10 MG tablet    Sig: Take 1 tablet (10 mg total) by mouth daily.    Dispense:  180 tablet    Refill:  3    Follow Up:  In Person in 6 month(s)  Signed, Evalina Field, MD  07/18/2020 4:04 PM    [facility]

## 2020-07-16 NOTE — Progress Notes (Signed)
  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     Indication for treatment:  Palliation       Radiation treatment dates:   8/16-8/26/21  Site/dose:  1.  The left lung lesion was treated to 50 Gy in 5 fractions of 10 Gy 2.  The right hip lesion was treated to 50 Gy in 5 fractions of 10 Gy  Beams/energy:    1.  The left lung lesion was treated using 3 VMAT beams with 6X 2.  The right hip lesion was treated using 3 VMAT beams with 6X  Narrative: The patient tolerated radiation treatment relatively well.     Plan: The patient has completed radiation treatment. The patient will return to radiation oncology clinic for routine followup in one month. I advised him to call or return sooner if he has any questions or concerns related to his recovery or treatment. ________________________________  Sheral Apley. Tammi Klippel, M.D.

## 2020-07-17 ENCOUNTER — Other Ambulatory Visit: Payer: Self-pay

## 2020-07-17 ENCOUNTER — Ambulatory Visit: Payer: Medicare Other

## 2020-07-17 DIAGNOSIS — G8929 Other chronic pain: Secondary | ICD-10-CM

## 2020-07-17 DIAGNOSIS — M25651 Stiffness of right hip, not elsewhere classified: Secondary | ICD-10-CM

## 2020-07-17 DIAGNOSIS — M25551 Pain in right hip: Secondary | ICD-10-CM | POA: Diagnosis not present

## 2020-07-17 DIAGNOSIS — R262 Difficulty in walking, not elsewhere classified: Secondary | ICD-10-CM

## 2020-07-17 DIAGNOSIS — R2681 Unsteadiness on feet: Secondary | ICD-10-CM

## 2020-07-17 DIAGNOSIS — R2689 Other abnormalities of gait and mobility: Secondary | ICD-10-CM

## 2020-07-17 DIAGNOSIS — M6281 Muscle weakness (generalized): Secondary | ICD-10-CM

## 2020-07-17 NOTE — Therapy (Signed)
[facility] [place] [address]  [place], [place], [address] Phone: [phone]   Fax:  [phone]  Physical Therapy Treatment  Patient Details  Name: James Massey MRN: [id number] Date of Birth: 04/10/1946 Referring Provider (PT): Marygrace Drought, MD   Encounter Date: 07/17/2020   PT End of Session - 07/17/20 1321    Visit Number 2    Number of Visits 16    Date for PT Re-Evaluation 09/04/20    Authorization Type Medicare & BCBS    PT Start Time 1314    PT Stop Time 1410    PT Time Calculation (min) 56 min    Equipment Utilized During Treatment Gait belt    Activity Tolerance Patient tolerated treatment well;Patient limited by pain    Behavior During Therapy [facility] for tasks assessed/performed           Past Medical History:  Diagnosis Date  . Anxiety   . Barrett esophagus   . GERD (gastroesophageal reflux disease)   . Gout   . Heart murmur   . History of anemia   . Kidney cancer, primary, 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 to other site [facility])   . Mixed hyperlipidemia   . Renal cell cancer, right ([place])   . Right renal mass   . Wears glasses     Past Surgical History:  Procedure Laterality Date  . CYSTOSCOPY WITH RETROGRADE PYELOGRAM, URETEROSCOPY AND STENT PLACEMENT Right 05/21/2018   Procedure: RIGHT RETROGRADE PYELOGRAM, RIGHT DIAGNOSTIC URETEROSCOPY AND STENT PLACEMENT;  Surgeon: Ardis Hughs, MD;  Location: [facility];  Service: Urology;  Laterality: Right;  . INGUINAL HERNIA REPAIR Bilateral 09/2012  . LAPAROSCOPIC NEPHRECTOMY, HAND ASSISTED Right 05/31/2018   Procedure: LAPAROSCOPIC RADICAL RIGHT NEPHRECTOMY;  Surgeon: Ardis Hughs, MD;  Location: [facility];  Service: Urology;  Laterality: Right;    There were no vitals filed for this visit.   Subjective Assessment - 07/17/20 1318    Subjective Pt. reporting most LBP in mornings.    Patient is accompained by: Family member   Wife    Diagnostic tests 04/11/20 - CT ABDOMEN AND PELVIS IMPRESSION     1. Progressive osseous metastasis. Although the right inferior pubic  ramus lytic lesion is similar, a new right posterior acetabular  lesion is seen, and may predispose the patient to pathologic  fracture.  2. No evidence of soft tissue metastasis within the abdomen or  pelvis.  3. Suspect pancreas divisum.    Patient Stated Goals "to be able to walk w/o having to use the walker & eliminate at least 3/4ths of the pain"    Currently in Pain? Yes    Pain Score 0-No pain   Pain rising to 7-8/10 with transitional movements   Pain Location Hip    Pain Orientation Right    Pain Descriptors / Indicators Sharp    Pain Type Acute pain;Chronic pain    Pain Radiating Towards into lower back    Pain Frequency Intermittent    Aggravating Factors  sit<>standing transitions    Multiple Pain Sites Yes    Pain Score 0   comes and goes up to a 7-8/10 when walking   Pain Location Knee    Pain Orientation Right;Anterior    Pain Descriptors / Indicators Sharp    Pain Type Acute pain;Chronic pain    Pain Onset More than a month ago    Pain Frequency Intermittent  [place] Adult PT Treatment/Exercise - 07/17/20 0001      Transfers   Transfers Sit to Stand;Stand to Sit    Sit to Stand 5: Supervision    Sit to Stand Details --   cues for hand placement and LE effort   Stand to Sit 5: Supervision;With upper extremity assist;To bed;To chair/3-in-1;With armrests    Stand to Sit Details (indicate cue type and reason) --   cues for hand placement     Self-Care   Self-Care Other Self-Care Comments    Other Self-Care Comments  review of HEP; answered questions from wife and pt. regarding rationale of each activity      Knee/Hip Exercises: Aerobic   Nustep lvl 3, 6 min  (UE/LE)      Knee/Hip Exercises: Seated   Sit to Sand 5 reps;2 sets   from chair with armrests     Knee/Hip Exercises: Supine   Heel  Slides Right;10 reps;Strengthening    Heel Slides Limitations heel slide with strap    Bridges Both;10 reps;Strengthening    Bridges Limitations cues for increased hip ROM    Straight Leg Raises Right;10 reps;Strengthening    Straight Leg Raises Limitations cues for quad set                  PT Education - 07/17/20 1421    Education Details HEP update    Person(s) Educated Patient    Methods Explanation;Demonstration;Verbal cues;Handout    Comprehension Verbalized understanding;Returned demonstration;Verbal cues required            PT Short Term Goals - 07/17/20 1322      PT SHORT TERM GOAL #1   Title Patient will be independent with initial HEP    Status On-going    Target Date 07/31/20      PT SHORT TERM GOAL #2   Title Patient will demonstrate safe transfer technique and proper gait pattern with 4-wheel rolling walker or LRAD as indicated    Status On-going    Target Date 08/07/20      PT SHORT TERM GOAL #3   Title Patient will increase gait speed to >/= 2.0 ft/sec with RW or LRAD to decrease risk for recurrent falls    Status On-going    Target Date 08/07/20      PT SHORT TERM GOAL #4   Title Patient will demonstrate decreased TUG time to </= 28 sec with RW or LRAD to decrease risk for falls with transitional mobility    Status On-going    Target Date 08/07/20             PT Long Term Goals - 07/17/20 1329      PT LONG TERM GOAL #1   Title Patient will be independent with ongoing/advanced HEP for self-management at home    Status On-going      PT LONG TERM GOAL #2   Title Decrease R hip and knee pain by >/= 50-75% during transitional movements allowing patient increased ease of bed mobiliy and transfers    Status On-going      PT LONG TERM GOAL #3   Title Patient to improve R hip AROM to [facility] without pain provocation    Status On-going      PT LONG TERM GOAL #4   Title Patient will demonstrate improved B LE strength to >/= 4/5 for improved  stability and ease of mobility    Status On-going      PT LONG TERM GOAL #5  Title Patient will increase gait speed to >/= 2.62 ft/sec with SPC or LRAD to increase safety with community ambulation    Status On-going      PT LONG TERM GOAL #6   Title Patient will demonstrate decreased TUG time to </= 18 sec with SPC or LRAD to decrease risk for falls with transitional mobility    Status On-going      PT LONG TERM GOAL #7   Title Patient will improve Berg score to >/= 46/56 to improve safety stability with ADLs in standing and reduce risk for falls    Status On-going                 Plan - 07/17/20 1512    Clinical Impression Statement James Massey tolerated initiation of R hip gentle ROM activities well today however did require encouragement for smooth movement pattern and for increased confidence over R LE with transfers.  created Initial HEP with pt. able to demo well and handout issued to pt. with wife present.  Will plan to review HEP prn and consider additional HEP update in coming session.    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); R nephrectomy 2 cancer; OA; gout; GERD    Rehab Potential Good    PT Frequency 2x / week    PT Duration 8 weeks    PT Treatment/Interventions ADLs/Self Care Home Management;Cryotherapy;Moist Heat;DME Instruction;Gait training;Stair training;Functional mobility training;Therapeutic activities;Therapeutic exercise;Balance training;Neuromuscular re-education;Patient/family education;Manual techniques;Passive range of motion;Dry needling;Taping;Vasopneumatic Device    PT Next Visit Plan Review safe transfers with RW; gait training to normalize gait pattern with RW    Consulted and Agree with Plan of Care Patient;Family member/caregiver    Family Member Consulted wife           Patient will benefit from skilled therapeutic intervention in order to improve the following deficits and impairments:  Abnormal  gait,Decreased activity tolerance,Decreased balance,Decreased endurance,Decreased knowledge of precautions,Decreased knowledge of use of DME,Decreased mobility,Decreased range of motion,Decreased safety awareness,Decreased strength,Difficulty walking,Increased fascial restricitons,Increased muscle spasms,Impaired perceived functional ability,Impaired flexibility,Improper body mechanics,Postural dysfunction,Pain  Visit Diagnosis: Pain in right hip  Stiffness of right hip, not elsewhere classified  Chronic pain of right knee  Unsteadiness on feet  Other abnormalities of gait and mobility  Difficulty in walking, not elsewhere classified  Muscle weakness (generalized)     Problem List Patient Active Problem List   Diagnosis Date Noted  . 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 ([place]) 01/31/2020  . 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 Renal mass, left 05/31/2018    Bess Harvest, PTA 07/17/20 [facility] [address]  [place], [place], [address] Phone: [phone]   Fax:  [phone]  Name: James Massey MRN: [id number] Date of Birth: 09/13/1945

## 2020-07-18 ENCOUNTER — Telehealth (INDEPENDENT_AMBULATORY_CARE_PROVIDER_SITE_OTHER): Payer: Medicare Other | Admitting: Cardiovascular Disease

## 2020-07-18 ENCOUNTER — Encounter: Payer: Self-pay | Admitting: Cardiovascular Disease

## 2020-07-18 DIAGNOSIS — I158 Other secondary hypertension: Secondary | ICD-10-CM | POA: Diagnosis not present

## 2020-07-18 DIAGNOSIS — I480 Paroxysmal atrial fibrillation: Secondary | ICD-10-CM

## 2020-07-18 DIAGNOSIS — I491 Atrial premature depolarization: Secondary | ICD-10-CM

## 2020-07-18 MED ORDER — AMLODIPINE BESYLATE 10 MG PO TABS: 10.0000 mg | ORAL_TABLET | Freq: Every day | ORAL | 3 refills | Status: DC

## 2020-07-18 MED FILL — CABOMETYX 40 MG TABLET: 40 | 30 days supply | Qty: 30 | Fill #0

## 2020-07-18 NOTE — Patient Instructions (Signed)
Medication Instructions:  Stop Metoprolol  Start Amlodipine 10 mg daily   *If you need a refill on your cardiac medications before your next appointment, please call your pharmacy*   Follow-Up: At [facility], you and your health needs are our priority.  As part of our continuing mission to provide you with exceptional heart care, we have created designated Provider Care Teams.  These Care Teams include your primary Cardiologist (physician) and Advanced Practice Providers (APPs -  Physician Assistants and Nurse Practitioners) who all work together to provide you with the care you need, when you need it.  We recommend signing up for the patient portal called "MyChart".  Sign up information is provided on this After Visit Summary.  MyChart is used to connect with patients for Virtual Visits (Telemedicine).  Patients are able to view lab/test results, encounter notes, upcoming appointments, etc.  Non-urgent messages can be sent to your provider as well.   To learn more about what you can do with MyChart, go to [email or web address].    Your next appointment:   6 month(s)  The format for your next appointment:   In Person  Provider:   Eleonore Chiquito, MD   Other Instructions EKG with your in office visit needed in 6 months.

## 2020-07-19 ENCOUNTER — Ambulatory Visit: Payer: Medicare Other | Admitting: Physical Therapy

## 2020-07-19 ENCOUNTER — Encounter: Payer: Self-pay | Admitting: Physical Therapy

## 2020-07-19 ENCOUNTER — Other Ambulatory Visit: Payer: Self-pay

## 2020-07-19 DIAGNOSIS — M25551 Pain in right hip: Secondary | ICD-10-CM | POA: Diagnosis not present

## 2020-07-19 DIAGNOSIS — R2689 Other abnormalities of gait and mobility: Secondary | ICD-10-CM

## 2020-07-19 DIAGNOSIS — R262 Difficulty in walking, not elsewhere classified: Secondary | ICD-10-CM

## 2020-07-19 DIAGNOSIS — M25561 Pain in right knee: Secondary | ICD-10-CM

## 2020-07-19 DIAGNOSIS — M25651 Stiffness of right hip, not elsewhere classified: Secondary | ICD-10-CM

## 2020-07-19 DIAGNOSIS — M6281 Muscle weakness (generalized): Secondary | ICD-10-CM

## 2020-07-19 DIAGNOSIS — R2681 Unsteadiness on feet: Secondary | ICD-10-CM

## 2020-07-19 DIAGNOSIS — G8929 Other chronic pain: Secondary | ICD-10-CM

## 2020-07-19 NOTE — Patient Instructions (Signed)
    Home exercise program created by JoAnne Kreis, PT.  For questions, please contact JoAnne via phone at [phone] or email at [email or web address]  [facility] [place] [address]  [address] [place], [place], [address] Phone: [phone]   Fax:  [phone]    

## 2020-07-19 NOTE — Therapy (Signed)
[facility] [address]  [place], [place], [address] Phone: [phone]   Fax:  [phone]  Physical Therapy Treatment  Patient Details  Name: James Massey MRN: [id number] Date of Birth: 1946/01/23 Referring Provider (PT): Marygrace Drought, MD   Encounter Date: 07/19/2020   PT End of Session - 07/19/20 1536    Visit Number 3    Number of Visits 16    Date for PT Re-Evaluation 09/04/20    Authorization Type Medicare & BCBS    PT Start Time 1536    PT Stop Time 1619    PT Time Calculation (min) 43 min    Equipment Utilized During Treatment Gait belt    Activity Tolerance Patient tolerated treatment well;Patient limited by pain    Behavior During Therapy [facility] for tasks assessed/performed           Past Medical History:  Diagnosis Date  . Anxiety   . Barrett esophagus   . GERD (gastroesophageal reflux disease)   . Gout   . Heart murmur   . History of anemia   . Kidney cancer, primary, 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 to other site [facility])   . Mixed hyperlipidemia   . Renal cell cancer, right ([place])   . Right renal mass   . Wears glasses     Past Surgical History:  Procedure Laterality Date  . CYSTOSCOPY WITH RETROGRADE PYELOGRAM, URETEROSCOPY AND STENT PLACEMENT Right 05/21/2018   Procedure: RIGHT RETROGRADE PYELOGRAM, RIGHT DIAGNOSTIC URETEROSCOPY AND STENT PLACEMENT;  Surgeon: Ardis Hughs, MD;  Location[facility];  Service: Urology;  Laterality: Right;  . INGUINAL HERNIA REPAIR Bilateral 09/2012  . LAPAROSCOPIC NEPHRECTOMY, HAND ASSISTED Right 05/31/2018   Procedure: LAPAROSCOPIC RADICAL RIGHT NEPHRECTOMY;  Surgeon: Ardis Hughs, MD;  [facility];  Service: Urology;  Laterality: Right;    There were no vitals filed for this visit.   Subjective Assessment - 07/19/20 1541    Subjective Pt and wife reported he completed the HEP this morning and felt like he was better able to lift  his leg by the end of the set. He states his R knee is hurting after tripping onthe door threshold when leaving the house.    Patient is accompained by: Family member   Wife   Diagnostic tests 04/11/20 - CT ABDOMEN AND PELVIS IMPRESSION     1. Progressive osseous metastasis. Although the right inferior pubic  ramus lytic lesion is similar, a new right posterior acetabular  lesion is seen, and may predispose the patient to pathologic  fracture.  2. No evidence of soft tissue metastasis within the abdomen or  pelvis.  3. Suspect pancreas divisum.    Patient Stated Goals "to be able to walk w/o having to use the walker & eliminate at least 3/4ths of the pain"    Currently in Pain? Yes    Pain Score 5     Pain Location Hip    Pain Orientation Right    Pain Descriptors / Indicators Sharp    Pain Score 5   4-5/10   Pain Location Knee   & shin   Pain Orientation Right;Anterior    Pain Descriptors / Indicators [facility] Adult PT Treatment/Exercise - 07/19/20 1536      Transfers   Sit to  Stand 5: Supervision;With upper extremity assist;From bed;From chair/3-in-1   with & w/o armrests   Sit to Stand Details Verbal cues for precautions/safety;Verbal cues for technique    Sit to Stand Details (indicate cue type and reason) cues to push up from armrests/seat rather than pull up on RW    Stand to Sit 5: Supervision;With upper extremity assist;To bed;To chair/3-in-1   with & w/o armrests   Stand to Sit Details (indicate cue type and reason) Verbal cues for precautions/safety;Verbal cues for technique    Stand to Sit Details cues to release RW/SW and reach back for seat to control descent      Exercises   Exercises Knee/Hip      Knee/Hip Exercises: Stretches   Other Knee/Hip Stretches R anterior tibialis stretch in sitting & standing x 30 sec each      Knee/Hip Exercises: Aerobic   Nustep L3 x 6 min  (UE/LE)      Knee/Hip Exercises: Standing   Heel  Raises Both;10 reps;3 seconds    Heel Raises Limitations heel/toe raises in RW    Hip Flexion Right;Left;10 reps;Stengthening;Knee bent    Hip Flexion Limitations march in RW    Hip Abduction Right;Left;10 reps;Stengthening;Knee straight    Abduction Limitations initially limted by pain in ant tib - better tolerated after MT & stretching for ant tib; UE support on RW    Hip Extension Right;Left;10 reps;Stengthening;Knee straight    Extension Limitations UE support on RW    Functional Squat 10 reps;5 seconds    Functional Squat Limitations counter mini-squat with chair behind for safety    Other Standing Knee Exercises Lateral weight shift in RW x 10   initially limted by pain in ant tib - better tolerated after MT & stretching for ant tib     Manual Therapy   Manual Therapy Soft tissue mobilization;Myofascial release    Soft tissue mobilization STM/DTM to R anterior tibialis    Myofascial Release manual TPR and pin & stretch to R anterior tibialis                  PT Education - 07/19/20 1615    Education Details HEP progression - standing exercises    Person(s) Educated Patient    Methods Explanation;Demonstration;Verbal cues;Tactile cues;Handout    Comprehension Verbalized understanding;Verbal cues required;Tactile cues required;Returned demonstration;Need further instruction            PT Short Term Goals - 07/17/20 1322      PT SHORT TERM GOAL #1   Title Patient will be independent with initial HEP    Status On-going    Target Date 07/31/20      PT SHORT TERM GOAL #2   Title Patient will demonstrate safe transfer technique and proper gait pattern with 4-wheel rolling walker or LRAD as indicated    Status On-going    Target Date 08/07/20      PT SHORT TERM GOAL #3   Title Patient will increase gait speed to >/= 2.0 ft/sec with RW or LRAD to decrease risk for recurrent falls    Status On-going    Target Date 08/07/20      PT SHORT TERM GOAL #4   Title Patient  will demonstrate decreased TUG time to </= 28 sec with RW or LRAD to decrease risk for falls with transitional mobility    Status On-going    Target Date 08/07/20             PT Long  Term Goals - 07/19/20 1628      PT LONG TERM GOAL #1   Title Patient will be independent with ongoing/advanced HEP for self-management at home    Status On-going    Target Date 09/04/20      PT LONG TERM GOAL #2   Title Decrease R hip and knee pain by >/= 50-75% during transitional movements allowing patient increased ease of bed mobiliy and transfers    Status On-going    Target Date 09/04/20      PT LONG TERM GOAL #3   Title Patient to improve R hip AROM to [facility] without pain provocation    Status On-going    Target Date 09/04/20      PT LONG TERM GOAL #4   Title Patient will demonstrate improved B LE strength to >/= 4/5 for improved stability and ease of mobility    Status On-going    Target Date 09/04/20      PT LONG TERM GOAL #5   Title Patient will increase gait speed to >/= 2.62 ft/sec with SPC or LRAD to increase safety with community ambulation    Status On-going    Target Date 09/04/20      PT LONG TERM GOAL #6   Title Patient will demonstrate decreased TUG time to </= 18 sec with SPC or LRAD to decrease risk for falls with transitional mobility    Status On-going    Target Date 09/04/20      PT LONG TERM GOAL #7   Title Patient will improve Berg score to >/= 46/56 to improve safety stability with ADLs in standing and reduce risk for falls    Status On-going    Target Date 09/04/20                 Plan - 07/19/20 1619    Clinical Impression Statement James Massey notes his R leg becomes easier to move after doing his exercises but notes increased pain in his R knee/anterior tibialis after catching his R foot on the threshold while leaving the house today. Increased tension/TPs noted in proximal R ant tib which were addressed with manual STM/MFR/TPR with pt noting decreased pain  and better exercise tolerance following this. Exercises targeting improving weight bearing tolerance, weight shifting and proximal LE strengthening to allow for better foot advancement and clearance with gait as well as increased ease of transitional movements. Pt noting less pain and increased ease of lifting his R leg following exercises, therefore explained concept of "motion is lotion" to encourage good carryover with HEP.    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); R nephrectomy 2 cancer; OA; gout; GERD    Rehab Potential Good    PT Frequency 2x / week    PT Duration 8 weeks    PT Treatment/Interventions ADLs/Self Care Home Management;Cryotherapy;Moist Heat;DME Instruction;Gait training;Stair training;Functional mobility training;Therapeutic activities;Therapeutic exercise;Balance training;Neuromuscular re-education;Patient/family education;Manual techniques;Passive range of motion;Dry needling;Taping;Vasopneumatic Device    PT Next Visit Plan Review safe transfers with RW; gait training to normalize gait pattern with RW; progress R LE weightbearing/strengthening tolerance as able    Consulted and Agree with Plan of Care Patient;Family member/caregiver    Family Member Consulted wife           Patient will benefit from skilled therapeutic intervention in order to improve the following deficits and impairments:  Abnormal gait,Decreased activity tolerance,Decreased balance,Decreased endurance,Decreased knowledge of precautions,Decreased knowledge of use of DME,Decreased mobility,Decreased range of motion,Decreased  safety awareness,Decreased strength,Difficulty walking,Increased fascial restricitons,Increased muscle spasms,Impaired perceived functional ability,Impaired flexibility,Improper body mechanics,Postural dysfunction,Pain  Visit Diagnosis: Pain in right hip  Stiffness of right hip, not elsewhere classified  Chronic pain of right  knee  Unsteadiness on feet  Other abnormalities of gait and mobility  Difficulty in walking, not elsewhere classified  Muscle weakness (generalized)     Problem List Patient Active Problem List   Diagnosis Date Noted  . 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 ([place]) 01/31/2020  . 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 Renal mass, left 05/31/2018    Percival Spanish, PT, MPT 07/19/2020, 4:44 PM  [facility] [address]  [place], [place], [address] Phone: [phone]   Fax:  [phone]  Name: James Massey MRN: [id number] Date of Birth: 05/05/1946

## 2020-07-23 ENCOUNTER — Ambulatory Visit: Payer: Medicare Other

## 2020-07-25 ENCOUNTER — Other Ambulatory Visit: Payer: Medicare Other

## 2020-07-25 ENCOUNTER — Ambulatory Visit: Payer: Medicare Other | Admitting: Oncology

## 2020-07-26 ENCOUNTER — Ambulatory Visit: Payer: Medicare Other | Admitting: Physical Therapy

## 2020-07-30 ENCOUNTER — Ambulatory Visit: Payer: Medicare Other | Admitting: Physical Therapy

## 2020-07-30 ENCOUNTER — Inpatient Hospital Stay: Payer: Medicare Other | Attending: Oncology | Admitting: Oncology

## 2020-07-30 ENCOUNTER — Other Ambulatory Visit: Payer: Self-pay

## 2020-07-30 ENCOUNTER — Inpatient Hospital Stay: Payer: Medicare Other

## 2020-07-30 ENCOUNTER — Encounter: Payer: Self-pay | Admitting: Physical Therapy

## 2020-07-30 VITALS — BP 156/90 | HR 86 | Temp 97.1°F | Resp 18 | Ht 69.0 in | Wt 198.0 lb

## 2020-07-30 DIAGNOSIS — N2889 Other specified disorders of kidney and ureter: Secondary | ICD-10-CM | POA: Diagnosis not present

## 2020-07-30 DIAGNOSIS — C641 Malignant neoplasm of right kidney, except renal pelvis: Secondary | ICD-10-CM

## 2020-07-30 DIAGNOSIS — C649 Malignant neoplasm of unspecified kidney, except renal pelvis: Secondary | ICD-10-CM

## 2020-07-30 DIAGNOSIS — M25551 Pain in right hip: Secondary | ICD-10-CM | POA: Diagnosis not present

## 2020-07-30 DIAGNOSIS — C7800 Secondary malignant neoplasm of unspecified lung: Secondary | ICD-10-CM | POA: Diagnosis not present

## 2020-07-30 DIAGNOSIS — C7951 Secondary malignant neoplasm of bone: Secondary | ICD-10-CM | POA: Insufficient documentation

## 2020-07-30 DIAGNOSIS — M25651 Stiffness of right hip, not elsewhere classified: Secondary | ICD-10-CM

## 2020-07-30 DIAGNOSIS — G8929 Other chronic pain: Secondary | ICD-10-CM

## 2020-07-30 DIAGNOSIS — Z923 Personal history of irradiation: Secondary | ICD-10-CM | POA: Insufficient documentation

## 2020-07-30 DIAGNOSIS — Z79899 Other long term (current) drug therapy: Secondary | ICD-10-CM | POA: Insufficient documentation

## 2020-07-30 DIAGNOSIS — R2689 Other abnormalities of gait and mobility: Secondary | ICD-10-CM

## 2020-07-30 DIAGNOSIS — M6281 Muscle weakness (generalized): Secondary | ICD-10-CM

## 2020-07-30 DIAGNOSIS — R2681 Unsteadiness on feet: Secondary | ICD-10-CM

## 2020-07-30 DIAGNOSIS — R918 Other nonspecific abnormal finding of lung field: Secondary | ICD-10-CM

## 2020-07-30 DIAGNOSIS — R262 Difficulty in walking, not elsewhere classified: Secondary | ICD-10-CM

## 2020-07-30 LAB — CBC WITH DIFFERENTIAL (CANCER CENTER ONLY)
Abs Immature Granulocytes: 0.01 10*3/uL (ref 0.00–0.07)
Basophils Absolute: 0 10*3/uL (ref 0.0–0.1)
Basophils Relative: 1 %
Eosinophils Absolute: 0.2 10*3/uL (ref 0.0–0.5)
Eosinophils Relative: 5 %
HCT: 40.8 % (ref 39.0–52.0)
Hemoglobin: 14.6 g/dL (ref 13.0–17.0)
Immature Granulocytes: 0 %
Lymphocytes Relative: 24 %
Lymphs Abs: 1.1 10*3/uL (ref 0.7–4.0)
MCH: 32.2 pg (ref 26.0–34.0)
MCHC: 35.8 g/dL (ref 30.0–36.0)
MCV: 90.1 fL (ref 80.0–100.0)
Monocytes Absolute: 0.4 10*3/uL (ref 0.1–1.0)
Monocytes Relative: 9 %
Neutro Abs: 2.8 10*3/uL (ref 1.7–7.7)
Neutrophils Relative %: 61 %
Platelet Count: 163 10*3/uL (ref 150–400)
RBC: 4.53 MIL/uL (ref 4.22–5.81)
RDW: 16 % — ABNORMAL HIGH (ref 11.5–15.5)
WBC Count: 4.6 10*3/uL (ref 4.0–10.5)
nRBC: 0 % (ref 0.0–0.2)

## 2020-07-30 LAB — CMP (CANCER CENTER ONLY)
ALT: 51 U/L — ABNORMAL HIGH (ref 0–44)
AST: 41 U/L (ref 15–41)
Albumin: 3.4 g/dL — ABNORMAL LOW (ref 3.5–5.0)
Alkaline Phosphatase: 80 U/L (ref 38–126)
Anion gap: 8 (ref 5–15)
BUN: 12 mg/dL (ref 8–23)
CO2: 28 mmol/L (ref 22–32)
Calcium: 8.6 mg/dL — ABNORMAL LOW (ref 8.9–10.3)
Chloride: 102 mmol/L (ref 98–111)
Creatinine: 1.32 mg/dL — ABNORMAL HIGH (ref 0.61–1.24)
GFR, Estimated: 57 mL/min — ABNORMAL LOW (ref >60)
Glucose, Bld: 118 mg/dL — ABNORMAL HIGH (ref 70–99)
Potassium: 3.2 mmol/L — ABNORMAL LOW (ref 3.5–5.1)
Sodium: 138 mmol/L (ref 135–145)
Total Bilirubin: 0.8 mg/dL (ref 0.3–1.2)
Total Protein: 6.9 g/dL (ref 6.5–8.1)

## 2020-07-30 NOTE — Progress Notes (Signed)
Hematology and Oncology Follow Up Visit  James Massey [id number] 09/30/1945 75 y.o. 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  He was diagnosed with localized disease in 2019   Prior Therapy:  He is status post laparoscopic right radical nephrectomy on May 31, 2018.  He is status post right hip and left lung radiation therapy completed in August 2021.  He received 50 Gray in 5 fractions  He is status post radiation to the right posterior acetabular lesion completed in November 2021.  He received at 50 Gray in 5 fractions.  Current therapy: Cabometyx 40 mg daily started on 05/28/2020.  Interim History: [name] returns today for repeat follow-up.  Since her last visit, he reports continuous improvement in his overall health.  He has tolerated Cabometyx without any new complaints.  Denies any nausea, vomiting or abdominal pain.  He denies any diarrhea.  He does report change in his taste periodically but still able to eat and maintain weight.  He is participating with physical therapy with improvement in his hip and knee pain.  His mobility is improving at this time.    Medications: Updated on review. Current Outpatient Medications  Medication Sig Dispense Refill  . allopurinol (ZYLOPRIM) 300 MG tablet Take 300 mg by mouth at bedtime.     Marland Kitchen amLODipine (NORVASC) 10 MG tablet Take 1 tablet (10 mg total) by mouth daily. 180 tablet 3  . atorvastatin (LIPITOR) 40 MG tablet Take 40 mg by mouth at bedtime.     . CABOMETYX 40 MG tablet TAKE 1 TABLET (40 MG TOTAL) BY MOUTH DAILY. TAKE ON AN EMPTY STOMACH, 1 HOUR BEFORE OR 2 HOURS AFTER MEALS. 30 tablet 0  . colchicine 0.6 MG tablet Take 2 tabs once followed by 1 tab an hour later for gout flare (total 1.8 mg dose/course). Do not repeat for at least 3 days.    . diazepam (VALIUM)  5 MG tablet Take 2.5-5 mg by mouth every 12 (twelve) hours as needed (for eye twitching).     . hydrocortisone 2.5 % cream Apply 1 application topically 2 (two) times daily as needed (for hemorroidal flare ups).    . magnesium 30 MG tablet Take 30 mg by mouth 2 (two) times daily.    . Multiple Vitamin (MULTIVITAMIN) tablet Take 1 tablet by mouth daily.    . pantoprazole (PROTONIX) 40 MG tablet Take 40 mg by mouth daily.    . pseudoephedrine-acetaminophen (TYLENOL SINUS) 30-500 MG TABS tablet Take 1-2 tablets by mouth every 4 (four) hours as needed (for sinus issues).    . Simethicone 180 MG CAPS Take 180 mg by mouth 3 (three) times daily as needed (for gas/indigestion.).    Marland Kitchen traMADol (ULTRAM) 50 MG tablet Take 1 tablet (50 mg total) by mouth every 6 (six) hours as needed. 60 tablet 0   No current facility-administered medications for this visit.     Allergies:  Allergies  Allergen Reactions  . Bee Venom Swelling  . Budesonide-Formoterol Fumarate Other (See Comments)    RESPIRATORY ISSUES  . Poison [employer] and ivy  . Clindamycin/Lincomycin Rash     Physical Exam:   Blood pressure (!) 156/90, pulse 86, temperature (!) 97.1 F (36.2 C), temperature source Tympanic, resp. rate 18, height 5\' 9"  (1.753 m), weight 198 lb (89.8  kg), SpO2 98 %.    ECOG: 1   General appearance: Comfortable appearing without any discomfort Head: Normocephalic without any trauma Oropharynx: Mucous membranes are moist and pink without any thrush or ulcers. Eyes: Pupils are equal and round reactive to light. Lymph nodes: No cervical, supraclavicular, inguinal or axillary lymphadenopathy.   Heart:regular rate and rhythm.  S1 and S2 without leg edema. Lung: Clear without any rhonchi or wheezes.  No dullness to percussion. Abdomin: Soft, nontender, nondistended with good bowel sounds.  No hepatosplenomegaly. Musculoskeletal: No joint deformity or effusion.  Full range of motion  noted. Neurological: No deficits noted on motor, sensory and deep tendon reflex exam. Skin: No petechial rash or dryness.  Appeared moist.          Lab Results: Lab Results  Component Value Date   WBC 4.6 07/30/2020   HGB 14.6 07/30/2020   HCT 40.8 07/30/2020   MCV 90.1 07/30/2020   PLT 163 07/30/2020     Chemistry      Component Value Date/Time   NA 136 06/19/2020 1127   K 4.4 06/19/2020 1127   CL 100 06/19/2020 1127   CO2 28 06/19/2020 1127   BUN 13 06/19/2020 1127   CREATININE 1.26 (H) 06/19/2020 1127      Component Value Date/Time   CALCIUM 8.9 06/19/2020 1127   ALKPHOS 113 06/19/2020 1127   AST 46 (H) 06/19/2020 1127   ALT 72 (H) 06/19/2020 1127   BILITOT 0.6 06/19/2020 1127       Impression and Plan:   75 year old man with:  1.   Stage IV clear-cell renal cell carcinoma with bone involvement and pulmonary disease diagnosed in 2021.   The natural course of this disease was reviewed at this time and treatment options were discussed.  He has tolerated CarboMedics without any major complications. The risks and benefits of continuing this treatment were reviewed.  Potential complications that include diarrhea, weight loss, fatigue and hypertension were reviewed.  Alternative treatment options would be the addition of nivolumab to cabozantinib or combination immunotherapy if he has less than adequate response.  Plan is to update his imaging studies before the next visit.  He is agreeable at this time.     3.  Prognosis and goals of care: His disease is incurable although aggressive measures are warranted at this time.  4.  Diarrhea prophylaxis: No diarrhea noted continues with antidiarrhea medication to use as needed.  5.  Pain: He has used tramadol without any major complications or exacerbation of his pain.  He has been using it very little at this time.   6. Follow up: In the next 4 to 6 weeks for repeat evaluation and imaging studies.   30   minutes were spent on this encounter.  The time was dedicated to reviewing disease status, discussing treatment options and outlining future plan of care.  [name], MD 1/24/20228:27 AM

## 2020-07-30 NOTE — Patient Instructions (Addendum)
    Home exercise program created by Fionnuala Hemmerich, PT.  For questions, please contact Tammie Ellsworth via phone at [phone] or email at [email or web address]  [facility] [place] [address]  [address] [place], [place], [address] Phone: [phone]   Fax:  [phone]    

## 2020-07-30 NOTE — Therapy (Signed)
[facility] [place] [address]  [place], [place], [address] Phone: [phone]   Fax:  [phone]  Physical Therapy Treatment  Patient Details  Name: James Massey MRN: [id number] Date of Birth: [birth year] Referring Provider (PT): Marygrace Drought, MD   Encounter Date: 07/30/2020   PT End of Session - 07/30/20 1315    Visit Number 4    Number of Visits 16    Date for PT Re-Evaluation 09/04/20    Authorization Type Medicare & BCBS    PT Start Time 1315    PT Stop Time 1403    PT Time Calculation (min) 48 min    Equipment Utilized During Treatment Gait belt    Activity Tolerance Patient tolerated treatment well    Behavior During Therapy [facility] for tasks assessed/performed           Past Medical History:  Diagnosis Date  . Anxiety   . Barrett esophagus   . GERD (gastroesophageal reflux disease)   . Gout   . Heart murmur   . History of anemia   . Kidney cancer, primary, 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 to other site [facility])   . Mixed hyperlipidemia   . Renal cell cancer, right ([place])   . Right renal mass   . Wears glasses     Past Surgical History:  Procedure Laterality Date  . CYSTOSCOPY WITH RETROGRADE PYELOGRAM, URETEROSCOPY AND STENT PLACEMENT Right 05/21/2018   Procedure: RIGHT RETROGRADE PYELOGRAM, RIGHT DIAGNOSTIC URETEROSCOPY AND STENT PLACEMENT;  Surgeon: Ardis Hughs, MD;  Location: [facility];  Service: Urology;  Laterality: Right;  . INGUINAL HERNIA REPAIR Bilateral 09/2012  . LAPAROSCOPIC NEPHRECTOMY, HAND ASSISTED Right 05/31/2018   Procedure: LAPAROSCOPIC RADICAL RIGHT NEPHRECTOMY;  Surgeon: Ardis Hughs, MD;  Location: [facility];  Service: Urology;  Laterality: Right;    There were no vitals filed for this visit.   Subjective Assessment - 07/30/20 1321    Subjective Pt unable to make it in to PT last week due to inclement weather but reports he was diligent with his HEP.    Patient is  accompained by: Family member   Wife   Diagnostic tests 04/11/20 - CT ABDOMEN AND PELVIS IMPRESSION     1. Progressive osseous metastasis. Although the right inferior pubic  ramus lytic lesion is similar, a new right posterior acetabular  lesion is seen, and may predispose the patient to pathologic  fracture.  2. No evidence of soft tissue metastasis within the abdomen or  pelvis.  3. Suspect pancreas divisum.    Patient Stated Goals "to be able to walk w/o having to use the walker & eliminate at least 3/4ths of the pain"    Currently in Pain? Yes    Pain Score 4    3-4/10   Pain Location Hip    Pain Orientation Right    Pain Descriptors / Indicators Sharp    Pain Type Acute pain;Chronic pain    Pain Frequency Intermittent    Pain Score 5   4-5/10   Pain Location Knee    Pain Orientation Right;Anterior    Pain Descriptors / Indicators Sharp    Pain Type Acute pain;Chronic pain    Pain Frequency Intermittent                             [facility] Adult PT Treatment/Exercise - 07/30/20 1315      Knee/Hip Exercises:  Aerobic   Nustep L3 x 6 min  (UE/LE)      Knee/Hip Exercises: Standing   Hip Flexion Right;Left;10 reps;Stengthening;Knee bent    Hip Flexion Limitations yellow TB march in RW    Hip Abduction Right;Left;10 reps;Stengthening;Knee straight    Abduction Limitations looped yellow TB at ankles; UE support on RW    Hip Extension Right;Left;10 reps;Stengthening;Knee straight    Extension Limitations looped yellow TB at ankles; UE support on RW      Knee/Hip Exercises: Seated   Long Arc Quad Right;Left;10 reps;AROM;Strengthening    Long Arc Quad Limitations + hip ADD ball squeeze    Clamshell with TheraBand Yellow   10  3"   Marching Right;Left;10 reps;Strengthening    Marching Limitations looped yellow TB at ankles    Hamstring Curl Right;Left;10 reps;Strengthening    Hamstring Limitations looped yellow TB at ankles                  PT Education -  07/30/20 1356    Education Details HEP progression - yellow TB standing & seated hip/knee strengthening            PT Short Term Goals - 07/30/20 1324      PT SHORT TERM GOAL #1   Title Patient will be independent with initial HEP    Status Achieved   07/30/20     PT SHORT TERM GOAL #2   Title Patient will demonstrate safe transfer technique and proper gait pattern with 4-wheel rolling walker or LRAD as indicated    Status On-going    Target Date 08/07/20      PT SHORT TERM GOAL #3   Title Patient will increase gait speed to >/= 2.0 ft/sec with RW or LRAD to decrease risk for recurrent falls    Status On-going    Target Date 08/07/20      PT SHORT TERM GOAL #4   Title Patient will demonstrate decreased TUG time to </= 28 sec with RW or LRAD to decrease risk for falls with transitional mobility    Status On-going    Target Date 08/07/20             PT Long Term Goals - 07/19/20 1628      PT LONG TERM GOAL #1   Title Patient will be independent with ongoing/advanced HEP for self-management at home    Status On-going    Target Date 09/04/20      PT LONG TERM GOAL #2   Title Decrease R hip and knee pain by >/= 50-75% during transitional movements allowing patient increased ease of bed mobiliy and transfers    Status On-going    Target Date 09/04/20      PT LONG TERM GOAL #3   Title Patient to improve R hip AROM to [facility] without pain provocation    Status On-going    Target Date 09/04/20      PT LONG TERM GOAL #4   Title Patient will demonstrate improved B LE strength to >/= 4/5 for improved stability and ease of mobility    Status On-going    Target Date 09/04/20      PT LONG TERM GOAL #5   Title Patient will increase gait speed to >/= 2.62 ft/sec with SPC or LRAD to increase safety with community ambulation    Status On-going    Target Date 09/04/20      PT LONG TERM GOAL #6   Title Patient will demonstrate decreased  TUG time to </= 18 sec with SPC or LRAD to  decrease risk for falls with transitional mobility    Status On-going    Target Date 09/04/20      PT LONG TERM GOAL #7   Title Patient will improve Berg score to >/= 46/56 to improve safety stability with ADLs in standing and reduce risk for falls    Status On-going    Target Date 09/04/20                 Plan - 07/30/20 1334    Clinical Impression Statement Richard reports he has been diligent with his HEP over the past week when he was unable to get out to come to PT due to the ice and denies need for any review - STG #1 met. Able to add yellow TB resistance to standing 3-way hip as well as introduction of seated hip and knee strengthening with good tolerance, therefore HEP progressed/updated with looped yellow TB provided for home use. He and his wife note improving ease of sit <> stand transfers and improving walking tolerance since starting PT.    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); R nephrectomy 2 cancer; OA; gout; GERD    Rehab Potential Good    PT Frequency 2x / week    PT Duration 8 weeks    PT Treatment/Interventions ADLs/Self Care Home Management;Cryotherapy;Moist Heat;DME Instruction;Gait training;Stair training;Functional mobility training;Therapeutic activities;Therapeutic exercise;Balance training;Neuromuscular re-education;Patient/family education;Manual techniques;Passive range of motion;Dry needling;Taping;Vasopneumatic Device    PT Next Visit Plan Review safe transfers with RW; gait training to normalize gait pattern with RW; progress R LE weightbearing/strengthening tolerance as able    Consulted and Agree with Plan of Care Patient;Family member/caregiver    Family Member Consulted wife           Patient will benefit from skilled therapeutic intervention in order to improve the following deficits and impairments:  Abnormal gait,Decreased activity tolerance,Decreased balance,Decreased endurance,Decreased knowledge  of precautions,Decreased knowledge of use of DME,Decreased mobility,Decreased range of motion,Decreased safety awareness,Decreased strength,Difficulty walking,Increased fascial restricitons,Increased muscle spasms,Impaired perceived functional ability,Impaired flexibility,Improper body mechanics,Postural dysfunction,Pain  Visit Diagnosis: Pain in right hip  Stiffness of right hip, not elsewhere classified  Chronic pain of right knee  Unsteadiness on feet  Other abnormalities of gait and mobility  Difficulty in walking, not elsewhere classified  Muscle weakness (generalized)     Problem List Patient Active Problem List   Diagnosis Date Noted  . 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 ([place]) 01/31/2020  . 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 Renal mass, left 05/31/2018    Percival Spanish, PT, MPT 07/30/2020, 2:07 PM  [facility] [address]  [address] [place], [place], [address] Phone: [phone]   Fax:  [phone]  Name: Hilton Saephan MRN: [id number] Date of Birth: 01-06-1946

## 2020-08-02 ENCOUNTER — Ambulatory Visit: Payer: Medicare Other | Admitting: Physical Therapy

## 2020-08-02 ENCOUNTER — Encounter: Payer: Self-pay | Admitting: Physical Therapy

## 2020-08-02 ENCOUNTER — Other Ambulatory Visit: Payer: Self-pay

## 2020-08-02 DIAGNOSIS — G8929 Other chronic pain: Secondary | ICD-10-CM

## 2020-08-02 DIAGNOSIS — M25551 Pain in right hip: Secondary | ICD-10-CM

## 2020-08-02 DIAGNOSIS — M6281 Muscle weakness (generalized): Secondary | ICD-10-CM

## 2020-08-02 DIAGNOSIS — R262 Difficulty in walking, not elsewhere classified: Secondary | ICD-10-CM

## 2020-08-02 DIAGNOSIS — R2689 Other abnormalities of gait and mobility: Secondary | ICD-10-CM

## 2020-08-02 DIAGNOSIS — M25651 Stiffness of right hip, not elsewhere classified: Secondary | ICD-10-CM

## 2020-08-02 DIAGNOSIS — R2681 Unsteadiness on feet: Secondary | ICD-10-CM

## 2020-08-02 NOTE — Therapy (Signed)
[facility] [address]  [place], [place], [address] Phone: [phone]   Fax:  [phone]  Physical Therapy Treatment  Patient Details  Name: James Massey MRN: [id number] Date of Birth: 1946/02/20 Referring Provider (PT): Marygrace Drought, MD   Encounter Date: 08/02/2020   PT End of Session - 08/02/20 1314    Visit Number 5    Number of Visits 16    Date for PT Re-Evaluation 09/04/20    Authorization Type Medicare & BCBS    PT Start Time [id number]    PT Stop Time 1403    PT Time Calculation (min) 49 min    Activity Tolerance Patient tolerated treatment well    Behavior During Therapy [facility] for tasks assessed/performed           Past Medical History:  Diagnosis Date  . Anxiety   . Barrett esophagus   . GERD (gastroesophageal reflux disease)   . Gout   . Heart murmur   . History of anemia   . Kidney cancer, primary, 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 to other site [facility])   . Mixed hyperlipidemia   . Renal cell cancer, right ([place])   . Right renal mass   . Wears glasses     Past Surgical History:  Procedure Laterality Date  . CYSTOSCOPY WITH RETROGRADE PYELOGRAM, URETEROSCOPY AND STENT PLACEMENT Right 05/21/2018   Procedure: RIGHT RETROGRADE PYELOGRAM, RIGHT DIAGNOSTIC URETEROSCOPY AND STENT PLACEMENT;  Surgeon: Ardis Hughs, MD;  Location: [facility];  Service: Urology;  Laterality: Right;  . INGUINAL HERNIA REPAIR Bilateral 09/2012  . LAPAROSCOPIC NEPHRECTOMY, HAND ASSISTED Right 05/31/2018   Procedure: LAPAROSCOPIC RADICAL RIGHT NEPHRECTOMY;  Surgeon: Ardis Hughs, MD;  Location: [facility];  Service: Urology;  Laterality: Right;    There were no vitals filed for this visit.   Subjective Assessment - 08/02/20 1321    Subjective Pt stating he is having a "hitch" in his step today - noting more difficulty picking up his R leg.    Patient is accompained by: Family member   Wife   Diagnostic tests  04/11/20 - CT ABDOMEN AND PELVIS IMPRESSION     1. Progressive osseous metastasis. Although the right inferior pubic  ramus lytic lesion is similar, a new right posterior acetabular  lesion is seen, and may predispose the patient to pathologic  fracture.  2. No evidence of soft tissue metastasis within the abdomen or  pelvis.  3. Suspect pancreas divisum.    Patient Stated Goals "to be able to walk w/o having to use the walker & eliminate at least 3/4ths of the pain"    Currently in Pain? Yes    Pain Score 4    3-4/10   Pain Location Hip    Pain Orientation Right    Pain Descriptors / Indicators Sharp    Pain Type Acute pain;Chronic pain    Pain Frequency Intermittent    Pain Score 4   3-4/10   Pain Location Knee    Pain Orientation Right;Anterior    Pain Descriptors / Indicators Sharp    Pain Type Acute pain;Chronic pain    Pain Frequency Intermittent                             [facility] Adult PT Treatment/Exercise - 08/02/20 1314      Ambulation/Gait   Ambulation/Gait Yes    Ambulation/Gait Assistance 5: Supervision  Ambulation/Gait Assistance Details cues for increased hip & knee flexion with increased stride length and heel strike on weight acceptance    Ambulation Distance (Feet) 180 Feet    Assistive device Rolling walker    Gait Pattern Step-through pattern;Antalgic;Decreased weight shift to right;Decreased stance time - right;Decreased step length - right;Decreased step length - left;Decreased stride length;Decreased hip/knee flexion - left;Poor foot clearance - left;Poor foot clearance - right;Shuffle   decreased heel strike bilaterally   Ambulation Surface Level;Indoor      Knee/Hip Exercises: Stretches   Passive Hamstring Stretch Right;Left;2 reps;30 seconds    Passive Hamstring Stretch Limitations seated hip hinge    Gastroc Stretch Right;Left;2 reps;30 seconds    Gastroc Stretch Limitations seated with strap      Knee/Hip Exercises: Aerobic   Nustep L4  x 7 min  (UE/LE)      Knee/Hip Exercises: Seated   Long Arc Quad Right;Left;10 reps;AROM;Strengthening    Long Arc Quad Limitations + hip ADD ball squeeze    Clamshell with TheraBand Yellow   10 x 3" - pt preferring to isolate 1 leg at a time   Marching Right;Left;10 reps;Strengthening    Marching Limitations looped yellow TB at ankles    Hamstring Curl Right;Left;10 reps;Strengthening    Hamstring Limitations looped yellow TB at ankles                  PT Education - 08/02/20 1400    Education Details HEP addition - HS & gastroc stretches    Person(s) Educated Patient    Methods Explanation;Demonstration;Verbal cues;Tactile cues;Handout    Comprehension Verbalized understanding;Verbal cues required;Tactile cues required;Returned demonstration;Need further instruction            PT Short Term Goals - 07/30/20 1324      PT SHORT TERM GOAL #1   Title Patient will be independent with initial HEP    Status Achieved   07/30/20     PT SHORT TERM GOAL #2   Title Patient will demonstrate safe transfer technique and proper gait pattern with 4-wheel rolling walker or LRAD as indicated    Status On-going    Target Date 08/07/20      PT SHORT TERM GOAL #3   Title Patient will increase gait speed to >/= 2.0 ft/sec with RW or LRAD to decrease risk for recurrent falls    Status On-going    Target Date 08/07/20      PT SHORT TERM GOAL #4   Title Patient will demonstrate decreased TUG time to </= 28 sec with RW or LRAD to decrease risk for falls with transitional mobility    Status On-going    Target Date 08/07/20             PT Long Term Goals - 07/19/20 1628      PT LONG TERM GOAL #1   Title Patient will be independent with ongoing/advanced HEP for self-management at home    Status On-going    Target Date 09/04/20      PT LONG TERM GOAL #2   Title Decrease R hip and knee pain by >/= 50-75% during transitional movements allowing patient increased ease of bed mobiliy  and transfers    Status On-going    Target Date 09/04/20      PT LONG TERM GOAL #3   Title Patient to improve R hip AROM to [facility] without pain provocation    Status On-going    Target Date 09/04/20  PT LONG TERM GOAL #4   Title Patient will demonstrate improved B LE strength to >/= 4/5 for improved stability and ease of mobility    Status On-going    Target Date 09/04/20      PT LONG TERM GOAL #5   Title Patient will increase gait speed to >/= 2.62 ft/sec with SPC or LRAD to increase safety with community ambulation    Status On-going    Target Date 09/04/20      PT LONG TERM GOAL #6   Title Patient will demonstrate decreased TUG time to </= 18 sec with SPC or LRAD to decrease risk for falls with transitional mobility    Status On-going    Target Date 09/04/20      PT LONG TERM GOAL #7   Title Patient will improve Berg score to >/= 46/56 to improve safety stability with ADLs in standing and reduce risk for falls    Status On-going    Target Date 09/04/20                 Plan - 08/02/20 1323    Clinical Impression Statement James Massey noting increased difficulty lifting his R LE today due weakness and R hip and anterior knee pain resulting in increased stiff leg shuffling gait on arrival to PT. Pt noting improvement following warm-up on NuStep with gait training provided encouraging increased hip and knee flexion, increased step length and heel strike on weight acceptance. Addressed pt's questions regarding seated exercise component of latest HEP update and added HS and gastroc stretches to address tightness potentially contributing to hip and knee pain.    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); R nephrectomy 2 cancer; OA; gout; GERD    Rehab Potential Good    PT Frequency 2x / week    PT Duration 8 weeks    PT Treatment/Interventions ADLs/Self Care Home Management;Cryotherapy;Moist Heat;DME Instruction;Gait training;Stair  training;Functional mobility training;Therapeutic activities;Therapeutic exercise;Balance training;Neuromuscular re-education;Patient/family education;Manual techniques;Passive range of motion;Dry needling;Taping;Vasopneumatic Device    PT Next Visit Plan STG assessment & MD PN; review safe transfers with RW; gait training to normalize gait pattern with RW; progress R LE weightbearing/strengthening tolerance as able    Consulted and Agree with Plan of Care Patient;Family member/caregiver    Family Member Consulted wife           Patient will benefit from skilled therapeutic intervention in order to improve the following deficits and impairments:  Abnormal gait,Decreased activity tolerance,Decreased balance,Decreased endurance,Decreased knowledge of precautions,Decreased knowledge of use of DME,Decreased mobility,Decreased range of motion,Decreased safety awareness,Decreased strength,Difficulty walking,Increased fascial restricitons,Increased muscle spasms,Impaired perceived functional ability,Impaired flexibility,Improper body mechanics,Postural dysfunction,Pain  Visit Diagnosis: Pain in right hip  Stiffness of right hip, not elsewhere classified  Chronic pain of right knee  Unsteadiness on feet  Other abnormalities of gait and mobility  Difficulty in walking, not elsewhere classified  Muscle weakness (generalized)     Problem List Patient Active Problem List   Diagnosis Date Noted  . 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 Renal mass, left 05/31/2018    Percival Spanish, PT, MPT 08/02/2020, 8:12 PM  [facility] [address]  [address] [place], [place], [address] Phone: [phone]   Fax:  [phone]  Name: James Massey MRN: [id number] Date of Birth: 07-20-1945

## 2020-08-02 NOTE — Patient Instructions (Signed)
    Home exercise program created by Lylie Blacklock, PT.  For questions, please contact Kenza Munar via phone at [phone] or email at [email or web address]  [facility] [address]  [address] [place], [place], [address] Phone: [phone]   Fax:  [phone]    

## 2020-08-06 ENCOUNTER — Ambulatory Visit: Payer: Medicare Other | Admitting: Physical Therapy

## 2020-08-06 ENCOUNTER — Encounter: Payer: Self-pay | Admitting: Physical Therapy

## 2020-08-06 ENCOUNTER — Other Ambulatory Visit: Payer: Self-pay

## 2020-08-06 DIAGNOSIS — M25651 Stiffness of right hip, not elsewhere classified: Secondary | ICD-10-CM

## 2020-08-06 DIAGNOSIS — R262 Difficulty in walking, not elsewhere classified: Secondary | ICD-10-CM

## 2020-08-06 DIAGNOSIS — M25551 Pain in right hip: Secondary | ICD-10-CM | POA: Diagnosis not present

## 2020-08-06 DIAGNOSIS — G8929 Other chronic pain: Secondary | ICD-10-CM

## 2020-08-06 DIAGNOSIS — M6281 Muscle weakness (generalized): Secondary | ICD-10-CM

## 2020-08-06 DIAGNOSIS — R2689 Other abnormalities of gait and mobility: Secondary | ICD-10-CM

## 2020-08-06 DIAGNOSIS — R2681 Unsteadiness on feet: Secondary | ICD-10-CM

## 2020-08-06 NOTE — Therapy (Addendum)
[facility] [address]  [place], [place], [address] Phone: [phone]   Fax:  [phone]  Physical Therapy Treatment / Progress Note / Discharge Summary  Patient Details  Name: James Massey MRN: [id number] Date of Birth: 25-Jun-1946 Referring Provider (PT): Marygrace Drought, MD  Progress Note  Reporting Period 07/10/2020 to 08/06/2020  See note below for Objective Data and Assessment of Progress/Goals.      Encounter Date: 08/06/2020   PT End of Session - 08/06/20 1309    Visit Number 6    Number of Visits 16    Date for PT Re-Evaluation 09/04/20    Authorization Type Medicare & BCBS    Progress Note Due on Visit 21   MD PN completed on visit #6 for appt on 08/07/20   PT Start Time 1309    PT Stop Time 1403    PT Time Calculation (min) 54 min    Activity Tolerance Patient tolerated treatment well    Behavior During Therapy [facility] for tasks assessed/performed           Past Medical History:  Diagnosis Date  . Anxiety   . Barrett esophagus   . GERD (gastroesophageal reflux disease)   . Gout   . Heart murmur   . History of anemia   . Kidney cancer, primary, 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 to other site [facility])   . Mixed hyperlipidemia   . Renal cell cancer, right ([place])   . Right renal mass   . Wears glasses     Past Surgical History:  Procedure Laterality Date  . CYSTOSCOPY WITH RETROGRADE PYELOGRAM, URETEROSCOPY AND STENT PLACEMENT Right 05/21/2018   Procedure: RIGHT RETROGRADE PYELOGRAM, RIGHT DIAGNOSTIC URETEROSCOPY AND STENT PLACEMENT;  Surgeon: Ardis Hughs, MD;  [facility];  Service: Urology;  Laterality: Right;  . INGUINAL HERNIA REPAIR Bilateral 09/2012  . LAPAROSCOPIC NEPHRECTOMY, HAND ASSISTED Right 05/31/2018   Procedure: LAPAROSCOPIC RADICAL RIGHT NEPHRECTOMY;  Surgeon: Ardis Hughs, MD;  [facility];  Service: Urology;  Laterality: Right;    There were no  vitals filed for this visit.   Subjective Assessment - 08/06/20 1316    Subjective Pt reports his gout flared-up yesterday for the first time in ~2 yrs - affecting his R great toe. Unable to get relief as he can no longer take NSAIDs due to single kidney.    Patient is accompained by: Family member   Wife   Diagnostic tests 04/11/20 - CT ABDOMEN AND PELVIS IMPRESSION     1. Progressive osseous metastasis. Although the right inferior pubic  ramus lytic lesion is similar, a new right posterior acetabular  lesion is seen, and may predispose the patient to pathologic  fracture.  2. No evidence of soft tissue metastasis within the abdomen or  pelvis.  3. Suspect pancreas divisum.    Patient Stated Goals "to be able to walk w/o having to use the walker & eliminate at least 3/4ths of the pain"    Pain Score 5     Pain Location Hip    Pain Orientation Right    Pain Descriptors / Indicators Sharp    Pain Type Acute pain;Chronic pain    Pain Frequency Intermittent    Pain Score 5    Pain Location Knee    Pain Orientation Right;Anterior    Pain Descriptors / Indicators Sharp    Pain Type Acute pain;Chronic pain    Pain Frequency Intermittent  Pain Score 8    Pain Location Toe (Comment which one)   great toe   Pain Orientation Right    Pain Type Acute pain    Pain Onset Yesterday    Aggravating Factors  gout flare-up    Pain Relieving Factors will have to let it "run its course" as he is unable to take anti-inflammatories              [facility] PT Assessment - 08/06/20 1309      Assessment   Medical Diagnosis R hip OA/pain    Referring Provider (PT) Marygrace Drought, MD    Onset Date/Surgical Date --   ~1 yr   Next MD Visit 08/07/20      Ambulation/Gait   Ambulation/Gait Yes    Ambulation/Gait Assistance 5: Supervision;4: Min guard    Ambulation/Gait Assistance Details cues for increased hip & knee flexion with increased stride length and heel strike on weight acceptance with targeted  foot placement    Ambulation Distance (Feet) 180 Feet    Assistive device 4-wheeled walker    Gait Pattern Step-through pattern;Antalgic;Decreased weight shift to right;Decreased stance time - right;Decreased step length - right;Decreased step length - left;Decreased stride length;Decreased hip/knee flexion - left;Poor foot clearance - left;Poor foot clearance - right;Shuffle   decreased heel strike bilaterally with decreaesed heel-toe progression   Ambulation Surface Level;Indoor    Gait velocity 1.43 ft/sec   with RW     Standardized Balance Assessment   10 Meter Walk 22.91 sec with RW      Timed Up and Go Test   Normal TUG (seconds) 27.96   with RW                        [facility] Adult PT Treatment/Exercise - 08/06/20 1309      Exercises   Exercises Knee/Hip      Knee/Hip Exercises: Standing   Functional Squat 10 reps;5 seconds    Functional Squat Limitations counter mini-squat with chair behind for safety - cues for posterior weight shift at hips, avoiding knees flexing fwd of toes                  PT Education - 08/06/20 1340    Education Details Rollator safety with transfers and gait    Person(s) Educated Patient    Methods Explanation;Verbal cues    Comprehension Verbalized understanding;Verbal cues required            PT Short Term Goals - 08/06/20 1320      PT SHORT TERM GOAL #1   Title Patient will be independent with initial HEP    Status Achieved   07/30/20     PT SHORT TERM GOAL #2   Title Patient will demonstrate safe transfer technique and proper gait pattern with 4-wheel rolling walker or LRAD as indicated    Status Partially Met   08/06/20: Pt demonstrates improved safety awareness of safe hand placement/proper transfer technique with RW/rollator but continues to require cues for increased hip and knee flexion, increased step length & heel strike on weight acceptance during gait   Target Date 08/07/20      PT SHORT TERM GOAL #3    Title Patient will increase gait speed to >/= 2.0 ft/sec with RW or LRAD to decrease risk for recurrent falls    Status On-going   08/06/20: gait speed decreased to 1.43 ft/sec today but likely due to gout flare-up affecting R great  toe   Target Date 08/07/20      PT SHORT TERM GOAL #4   Title Patient will demonstrate decreased TUG time to </= 28 sec with RW or LRAD to decrease risk for falls with transitional mobility    Status Achieved   08/06/20:  TUG = 27.96 sec with RW            PT Long Term Goals - 08/06/20 1320      PT LONG TERM GOAL #1   Title Patient will be independent with ongoing/advanced HEP for self-management at home    Status On-going    Target Date 09/04/20      PT LONG TERM GOAL #2   Title Decrease R hip and knee pain by >/= 50-75% during transitional movements allowing patient increased ease of bed mobiliy and transfers    Status On-going    Target Date 09/04/20      PT LONG TERM GOAL #3   Title Patient to improve R hip AROM to [facility] without pain provocation    Status On-going    Target Date 09/04/20      PT LONG TERM GOAL #4   Title Patient will demonstrate improved B LE strength to >/= 4/5 for improved stability and ease of mobility    Status On-going    Target Date 09/04/20      PT LONG TERM GOAL #5   Title Patient will increase gait speed to >/= 2.62 ft/sec with SPC or LRAD to increase safety with community ambulation    Status On-going    Target Date 09/04/20      PT LONG TERM GOAL #6   Title Patient will demonstrate decreased TUG time to </= 18 sec with SPC or LRAD to decrease risk for falls with transitional mobility    Status On-going    Target Date 09/04/20      PT LONG TERM GOAL #7   Title Patient will improve Berg score to >/= 46/56 to improve safety stability with ADLs in standing and reduce risk for falls    Status On-going    Target Date 09/04/20                 Plan - 08/06/20 1321    Clinical Impression Statement James Massey  reports improving mobility with decreasing R hip and knee pain since start of PT, however pain still up to 5/10 at times with mobility. He is also experiencing a gout flare-up affecting his R great toe today which is making walking more difficult and resulting in decreased gait speed. He notes good benefit from HEP and has been able to consistently progress exercises with added resistance and/or complexity - STG #1 met. He demonstrates improved awareness of safe transfer technique with AD but gait pattern both with standard RW and rollator (rollator introduced for first time today) continues to lack good consistent foot clearance due limited B hip and knee flexion, decreased step length and lack of heel strike on weight acceptance - STG #2 partially met. Balance is improving with TUG time reduced to 27.96 sec - STG #4 met. Remaining STG and LTGs still ongoing with pt demonstrating good potential for continued improvement with skilled PT to address ongoing pain, strength and balance deficits.    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); R nephrectomy 2 cancer; OA; gout; GERD    Rehab Potential Good    PT Frequency 2x / week    PT Duration  8 weeks    PT Treatment/Interventions ADLs/Self Care Home Management;Cryotherapy;Moist Heat;DME Instruction;Gait training;Stair training;Functional mobility training;Therapeutic activities;Therapeutic exercise;Balance training;Neuromuscular re-education;Patient/family education;Manual techniques;Passive range of motion;Dry needling;Taping;Vasopneumatic Device    PT Next Visit Plan gait training to normalize gait pattern with RW or rollator per pt preference; progress R LE weightbearing/strengthening tolerance as able; balance training to reduce fall risk    Consulted and Agree with Plan of Care Patient;Family member/caregiver    Family Member Consulted wife           Patient will benefit from skilled therapeutic intervention  in order to improve the following deficits and impairments:  Abnormal gait,Decreased activity tolerance,Decreased balance,Decreased endurance,Decreased knowledge of precautions,Decreased knowledge of use of DME,Decreased mobility,Decreased range of motion,Decreased safety awareness,Decreased strength,Difficulty walking,Increased fascial restricitons,Increased muscle spasms,Impaired perceived functional ability,Impaired flexibility,Improper body mechanics,Postural dysfunction,Pain  Visit Diagnosis: Pain in right hip  Stiffness of right hip, not elsewhere classified  Chronic pain of right knee  Unsteadiness on feet  Other abnormalities of gait and mobility  Difficulty in walking, not elsewhere classified  Muscle weakness (generalized)     Problem List Patient Active Problem List   Diagnosis Date Noted  . 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 Renal mass, left 05/31/2018    Percival Spanish, PT, MPT 08/06/2020, 6:27 PM  [facility] [address]  [place], [place], [address] Phone: [phone]   Fax:  [phone]  Name: Silus Lanzo MRN: [id number] Date of Birth: 1946/02/24   PHYSICAL THERAPY DISCHARGE SUMMARY  Visits from Start of Care: 6  Current functional level related to goals / functional outcomes:   Refer to above clinical impression for status as of last visit on 08/06/20. Patient called to cancel all remaining scheduled visits due to having to start radiation treatments for recurrence of his cancer, therefore will proceed with discharge from PT for this episode.   Remaining deficits:   As above.    Education / Equipment:   HEP  Plan: Patient agrees to discharge.  Patient goals were partially met. Patient is being discharged due to a change in medical status.  ?????     Percival Spanish, PT, MPT 09/21/20,  10:56 AM  [facility] [address]  [place], [place], [address] Phone: [phone]   Fax:  [phone]

## 2020-08-07 ENCOUNTER — Other Ambulatory Visit: Payer: Self-pay | Admitting: Oncology

## 2020-08-08 ENCOUNTER — Other Ambulatory Visit: Payer: Self-pay | Admitting: Oncology

## 2020-08-08 ENCOUNTER — Telehealth: Payer: Self-pay

## 2020-08-08 DIAGNOSIS — C649 Malignant neoplasm of unspecified kidney, except renal pelvis: Secondary | ICD-10-CM

## 2020-08-08 DIAGNOSIS — C419 Malignant neoplasm of bone and articular cartilage, unspecified: Secondary | ICD-10-CM

## 2020-08-08 NOTE — Telephone Encounter (Signed)
Called patient's wife Hoyle Sauer and made her aware of Dr. Hazeline Junker response to her questions. She verbalized understanding.

## 2020-08-08 NOTE — Telephone Encounter (Signed)
-----   Message from [name], MD sent at 08/08/2020  1:26 PM EST ----- Please let them know the following:   I discussed the x-rays with Dr. Redmond Pulling personally today and I recommend keeping the CT scan as scheduled.  I will arrange for radiation to new spots detected on x-ray.  Cabometyx can be continued with radiation and while taking colchicine.  Thanks ----- Message ----- From: Tami Lin, RN Sent: 08/08/2020   1:15 PM EST To: [name], MD  Patient's wife called and wants to know: Will the patient will need a CT scan sooner than the end of February since the scans he had done with Dr. Redmond Pulling at [facility] showed "two new spots."   She also wants to know if patient will possibly be starting radiation and stopping Cabometyx.   And she wants to know if it is safe for patient to take Colchicine while on Cabometyx. She said patient has a gout flare up and Dr. Redmond Pulling sent in a prescription for Colchicine but told them to check with you to be sure it's ok since patient is on Cabometyx and only has one kidney.  Lanelle Bal

## 2020-08-09 ENCOUNTER — Ambulatory Visit
Admission: RE | Admit: 2020-08-09 | Discharge: 2020-08-09 | Disposition: A | Discharge status: Home / Self Care | Payer: Medicare Other | Source: Ambulatory Visit | Attending: Urology | Admitting: Urology

## 2020-08-09 ENCOUNTER — Ambulatory Visit: Payer: Medicare Other | Admitting: Physical Therapy

## 2020-08-09 ENCOUNTER — Other Ambulatory Visit: Payer: Self-pay

## 2020-08-09 DIAGNOSIS — C649 Malignant neoplasm of unspecified kidney, except renal pelvis: Secondary | ICD-10-CM

## 2020-08-09 NOTE — Progress Notes (Signed)
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    ICD-10-CM   1. 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 malewas initially seen on 01/31/20 at the request of Dr. Alen Blew. He was initially diagnosed with renal cell carcinoma in 2019 after presenting with gross hematuria. He was treated with right radical nephrectomy on 05/31/2018 under the care of Dr. [name]. Final surgical pathology showed a stage T3a clear cell renal carcinoma with tumor invasion into the renal sinus fatty tissue, grade 3 with negative surgical margins and had remained in observation since that time with restaging scans showing no evidence of disease recurrence or metastasis.  He underwent surveillance CT C/A/P on 12/27/2019 showing a new 6.8 cm large expansile mass in the right inferior pubic ramus with a smaller lytic lesion in the right acetabular roof as well as 10 mm nodule in the LLL, concerning for metastases and additional tiny peripheral subpleural pulmonary nodules in the posterior RUL and RLL. He was having some persistent posterior right hip pain that was worse with sitting and pain in the right groin that was worse with weightbearing and activities.  He had been seeing an orthopedist and had a cortisone injection that only provided relief for approximately 2 days before the pain returned.   CT-guided core biopsy of the pubic ramus mass was performed on 01/19/2020 with final surgical pathology confirming  metastatic clear cell renal cell carcinoma, grade 2.  He met in consultation with Dr. Alen Blew on 01/18/2020 to discuss potential systemic treatment options.  His case was also presented at the multidisciplinary urologic oncology conference where consensus was to proceed with stereotactic radiation to the oligometastatic disease in the right hip and left lower lobe lung. We met with the patient on 01/31/2020 to discus SBRT. The patient and Dr. Alen Blew met back to discuss possible concurrent systemic therapy but agreed to hold off on immunotherapy for a few months and re-evaluate after his next set of staging scans. He subsequently completed 5 fractions of SBRT to the metastatic disease in the right posterior pubic ramus and left lung nodule-completed on 03/01/20.  He underwent restaging CT C/A/P on 04/11/2020 showing slightly progressive pulmonary metastases with a dominant LLL nodule minimally enlarged as well as indeterminate new tiny puilmonary nodules bilaterally. There was also evidence of progressive osseous metastases with a new right posterior acetabular lesion but the recently treated right pubic ramus lytic lesion appeared similar. There was no thoracic adenopathy or evidence of soft tissue metastasis within the abdomen or pelvis.  He was referred to Dr. Redmond Pulling in orthopedic oncology to discuss possible surgical treatment of the new right acetabular lesion which appeared threatening for future fracture. Dr. Redmond Pulling reached out to Dr. Tammi Klippel on 04/24/2020 and discussion was documented indicating that the new right posterior acetabular metastasis is mechanically stable and outside of his previous right ischial SBRT targeted area.  Dr. Redmond Pulling and Tammi Klippel agreed that SBRT would be a good option for management of the acetabular  lesion, reserving surgical therapy for salvage if needed in the future.  The patient was in agreement to proceed with SBRT and this was completed on 05/18/2020 and tolerated very well.  He  was started on single agent Cabometyx on 05/28/2020 which he has continued to tolerate well.  He is scheduled for repeat CT C/A/P at the end of February to assess treatment response and will meet back with Dr. Alen Blew on 09/05/2020 to review those results.  More recently, on repeat imaging with Dr. Redmond Pulling at [facility], he was noted to have an enlarging, destructive lytic lesion in the proximal third of the right femoral diaphysis measuring approximately 6.3 cm and threatening for pathologic fracture.  There were also multiple lytic lesions within the proximal left femoral diaphysis measuring 2.3 cm without evidence of pathologic fracture.  Therefore, he has been kindly referred back to [place] for further discussion of palliative radiation to the new lesions in the proximal femurs in an attempt to prevent progression and potential pathologic fracture.   PREVIOUS RADIATION THERAPY: Yes  05/08/20 - 05/18/20:   The target in the right acetabulum was treated to 50 Gy in 5 fractions of 10 Gy  02/20/20 - 03/01/20: Right posterior inferior pubic ramus  + LLL Lung nodule / 50 Gy in 5 fractions (SBRT)  PAST MEDICAL HISTORY:  Past Medical History:  Diagnosis Date  . Anxiety   . Barrett esophagus   . GERD (gastroesophageal reflux disease)   . Gout   . Heart murmur   . History of anemia   . Kidney cancer, primary, 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 to other site [facility])   . Mixed hyperlipidemia   . Renal cell cancer, right (Early)   . Right renal mass   . Wears glasses       PAST SURGICAL HISTORY: Past Surgical History:  Procedure Laterality Date  . CYSTOSCOPY WITH RETROGRADE PYELOGRAM, URETEROSCOPY AND STENT PLACEMENT Right 05/21/2018   Procedure: RIGHT RETROGRADE PYELOGRAM, RIGHT DIAGNOSTIC URETEROSCOPY AND STENT PLACEMENT;  Surgeon: Ardis Hughs, MD;  Location: [facility];  Service: Urology;  Laterality: Right;  . INGUINAL HERNIA REPAIR Bilateral 09/2012  . LAPAROSCOPIC NEPHRECTOMY, HAND ASSISTED Right 05/31/2018    Procedure: LAPAROSCOPIC RADICAL RIGHT NEPHRECTOMY;  Surgeon: Ardis Hughs, MD;  Location: [facility];  Service: Urology;  Laterality: Right;    FAMILY HISTORY:  Family History  Problem Relation Age of Onset  . Breast cancer Mother   . Heart attack Father   . Prostate cancer Neg Hx   . Pancreatic cancer Neg Hx   . Colon cancer Neg Hx     SOCIAL HISTORY:  Social History   Socioeconomic History  . Marital status: Married    Spouse name: Not on file  . Number of children: Not on file  . Years of education: Not on file  . Highest education level: Not on file  Occupational History  . Occupation: retired Biochemist, clinical  Tobacco Use  . Smoking status: Former Smoker    Packs/day: 0.50    Years: 15.00    Pack years: 7.50    Types: Cigarettes    Quit date: 12/12/1987    Years since quitting: 32.6  . Smokeless tobacco: Never Used  Vaping Use  . Vaping Use: Never used  Substance and Sexual Activity  . Alcohol use: Yes    Comment: occasional beer  . Drug use: Never  . Sexual activity: Not Currently  Other Topics Concern  . Not on file  Social History Narrative  .  Not on file   Social Determinants of Health   Financial Resource Strain: Not on file  Food Insecurity: Not on file  Transportation Needs: Not on file  Physical Activity: Not on file  Stress: Not on file  Social Connections: Not on file  Intimate Partner Violence: Not on file    ALLERGIES: Bee venom, Budesonide-formoterol fumarate, Poison oak extract, and Clindamycin/lincomycin  MEDICATIONS:  Current Outpatient Medications  Medication Sig Dispense Refill  . allopurinol (ZYLOPRIM) 300 MG tablet Take 300 mg by mouth at bedtime.     Marland Kitchen amLODipine (NORVASC) 10 MG tablet Take 1 tablet (10 mg total) by mouth daily. 180 tablet 3  . atorvastatin (LIPITOR) 40 MG tablet Take 40 mg by mouth at bedtime.     . CABOMETYX 40 MG tablet TAKE 1 TABLET (40 MG TOTAL) BY MOUTH DAILY. TAKE ON AN EMPTY STOMACH, 1 HOUR BEFORE OR 2  HOURS AFTER MEALS. 30 tablet 0  . colchicine 0.6 MG tablet Take 2 tabs once followed by 1 tab an hour later for gout flare (total 1.8 mg dose/course). Do not repeat for at least 3 days.    . diazepam (VALIUM) 5 MG tablet Take 2.5-5 mg by mouth every 12 (twelve) hours as needed (for eye twitching).     . hydrocortisone 2.5 % cream Apply 1 application topically 2 (two) times daily as needed (for hemorroidal flare ups).    . magnesium 30 MG tablet Take 30 mg by mouth 2 (two) times daily.    . Multiple Vitamin (MULTIVITAMIN) tablet Take 1 tablet by mouth daily.    . pantoprazole (PROTONIX) 40 MG tablet Take 40 mg by mouth daily.    . pseudoephedrine-acetaminophen (TYLENOL SINUS) 30-500 MG TABS tablet Take 1-2 tablets by mouth every 4 (four) hours as needed (for sinus issues).    . Simethicone 180 MG CAPS Take 180 mg by mouth 3 (three) times daily as needed (for gas/indigestion.).    Marland Kitchen traMADol (ULTRAM) 50 MG tablet Take 1 tablet (50 mg total) by mouth every 6 (six) hours as needed. 60 tablet 0   No current facility-administered medications for this encounter.    REVIEW OF SYSTEMS:  On review of systems, the patient reports that he is doing well overall. He denies any chest pain, shortness of breath, productive cough, hemoptysis, fevers, chills, night sweats, or recent unintended weight changes. He denies any bladder disturbances, and denies abdominal pain, nausea or vomiting. He denies right hip pain and numbness or tingling in his right leg. He does note weakness and intermittent pain to his right knee, but he feels this is related to arthritis. He endorses using a walker, as directed by orthopedics for support and safety. He also reports intermittent sternal and left 2nd-3rd rib pain that is less overall but intense when he sneezes, which is relieved with a hot pack.  A complete review of systems is obtained and is otherwise negative.    PHYSICAL EXAM:  Wt Readings from Last 3 Encounters:  07/30/20  198 lb (89.8 kg)  06/19/20 197 lb 9.6 oz (89.6 kg)  05/14/20 201 lb 8 oz (91.4 kg)   Temp Readings from Last 3 Encounters:  07/30/20 (!) 97.1 F (36.2 C) (Tympanic)  06/19/20 97.9 F (36.6 C) (Tympanic)  05/14/20 98.9 F (37.2 C) (Oral)   BP Readings from Last 3 Encounters:  07/30/20 (!) 156/90  06/19/20 (!) 158/88  05/14/20 140/73   Pulse Readings from Last 3 Encounters:  07/30/20 86  06/19/20 68  05/14/20 81    /10  In general this is a well appearing Caucasian man in no acute distress. He's alert and oriented x4 and appropriate throughout the examination. Cardiopulmonary assessment is negative for acute distress and he exhibits normal effort.    KPS = 100  100 - Normal; no complaints; no evidence of disease. 90   - Able to carry on normal activity; minor signs or symptoms of disease. 80   - Normal activity with effort; some signs or symptoms of disease. 45   - Cares for self; unable to carry on normal activity or to do active work. 60   - Requires occasional assistance, but is able to care for most of his personal needs. 50   - Requires considerable assistance and frequent medical care. 31   - Disabled; requires special care and assistance. 44   - Severely disabled; hospital admission is indicated although death not imminent. 31   - Very sick; hospital admission necessary; active supportive treatment necessary. 10   - Moribund; fatal processes progressing rapidly. 0     - Dead  Karnofsky DA, Abelmann Timberlake, Craver LS and Burchenal [facility] [phone]) The use of the nitrogen mustards in the palliative treatment of carcinoma: with particular reference to bronchogenic carcinoma Cancer 1 634-56  LABORATORY DATA:  Lab Results  Component Value Date   WBC 4.6 07/30/2020   HGB 14.6 07/30/2020   HCT 40.8 07/30/2020   MCV 90.1 07/30/2020   PLT 163 07/30/2020   Lab Results  Component Value Date   NA 138 07/30/2020   K 3.2 (L) 07/30/2020   CL 102 07/30/2020   CO2 28 07/30/2020    Lab Results  Component Value Date   ALT 51 (H) 07/30/2020   AST 41 07/30/2020   ALKPHOS 80 07/30/2020   BILITOT 0.8 07/30/2020     RADIOGRAPHY: No results found.    IMPRESSION/PLAN: This visit was conducted via MyChart to spare the patient unnecessary potential exposure in the healthcare setting during the current COVID-19 pandemic. 1. 75 y.o. 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  Today, I talked to the patient and his wife about the findings and workup thus far. We reviewed his imaging and discussed the natural history of renal cell carcinoma and general treatment, highlighting the role of radiotherapy in the management of osseous metastatic disease. We discussed the available radiation techniques, and focused on the details and logistics of delivery. The recommendation is to proceed with a hypofractionated course of palliative radiotherapy to the metastatic lesions in bilateral proximal femoral shafts in an effort to prevent progression and potential fracture down the road.  We reviewed the anticipated acute and late sequelae associated with radiation in this setting. The patient was encouraged to ask questions that were answered to his stated satisfaction.  At the end of our conversation, the patient would like to proceed with the recommended hypofractionated course of palliative radiotherapy focused on the lesions in bilateral proximal femoral shafts.  He appears to have a good understanding of his disease and our treatment recommendations which are of curative intent. He is scheduled for CT simulation/treatment planning at 1:30 pm on Tuesday, 08/14/2020 with plans to begin his treatments on 08/16/2020.  We will share our discussion with Dr. Alen Blew and Dr. Redmond Pulling and look forward to continuing to participate in his care.  Given current concerns for patient exposure during the COVID-19 pandemic, this encounter was conducted via  video-enabled telephone visit.  The patient has given verbal consent for this type of encounter. The time spent during this encounter was 30 minutes. The attendants for this meeting include Scarlettrose Costilow PA-C, patient, Nickolai Rinks and his wife. During the encounter, Londyn Wotton PA-C was located at [facility] Radiation Oncology Department.  Patient, Cope Marte and his wife were located at home.    Nicholos Johns, PA-C    Tyler Pita, MD  [facility] Direct Dial: [phone]  Fax: [phone] [email or web address]  Skype  LinkedIn

## 2020-08-13 ENCOUNTER — Ambulatory Visit
Admission: RE | Admit: 2020-08-13 | Discharge: 2020-08-13 | Disposition: A | Discharge status: Home / Self Care | Payer: Self-pay | Source: Ambulatory Visit | Attending: Radiation Oncology | Admitting: Radiation Oncology

## 2020-08-13 ENCOUNTER — Ambulatory Visit: Payer: Medicare Other | Admitting: Physical Therapy

## 2020-08-13 ENCOUNTER — Other Ambulatory Visit: Payer: Self-pay | Admitting: Radiation Oncology

## 2020-08-13 DIAGNOSIS — C649 Malignant neoplasm of unspecified kidney, except renal pelvis: Secondary | ICD-10-CM

## 2020-08-13 DIAGNOSIS — C7951 Secondary malignant neoplasm of bone: Secondary | ICD-10-CM

## 2020-08-13 MED FILL — CABOMETYX 40 MG TABLET: 40 | 30 days supply | Qty: 30 | Fill #0

## 2020-08-14 ENCOUNTER — Other Ambulatory Visit: Payer: Self-pay

## 2020-08-14 ENCOUNTER — Ambulatory Visit
Admission: RE | Admit: 2020-08-14 | Discharge: 2020-08-14 | Disposition: A | Discharge status: Home / Self Care | Payer: Medicare Other | Source: Ambulatory Visit | Attending: Radiation Oncology | Admitting: Radiation Oncology

## 2020-08-14 DIAGNOSIS — C649 Malignant neoplasm of unspecified kidney, except renal pelvis: Secondary | ICD-10-CM | POA: Insufficient documentation

## 2020-08-14 DIAGNOSIS — C7951 Secondary malignant neoplasm of bone: Secondary | ICD-10-CM | POA: Insufficient documentation

## 2020-08-14 NOTE — Progress Notes (Signed)
  Radiation Oncology         [phone] ________________________________  Name: James Massey MRN: [id number]  Date: 08/14/2020  DOB: 07/25/1945  SIMULATION AND TREATMENT PLANNING NOTE    ICD-10-CM   1. 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  Identity was confirmed.  All relevant records and images related to the planned course of therapy were reviewed.  The patient freely provided informed written consent to proceed with treatment after reviewing the details related to the planned course of therapy. The consent form was witnessed and verified by the simulation staff.  Then, the patient was set-up in a stable reproducible  supine position for radiation therapy.  CT images were obtained.  Surface markings were placed.  The CT images were loaded into the planning software.  Then the target and avoidance structures were contoured.  Treatment planning then occurred.  The radiation prescription was entered and confirmed.  Then, I designed and supervised the construction of a total of 3 medically necessary complex treatment devices consisting of leg positioner and MLC apertures to cover the treated hips/femurs area.  I have requested : 3D Simulation  I have requested a DVH of the following structures: Rectum, Bladder, femoral heads and target.  PLAN:  The patient will receive 30 Gy in 10 fractions to the right and left proximal femurs.  ________________________________  Sheral Apley Tammi Klippel, M.D.

## 2020-08-15 DIAGNOSIS — C7951 Secondary malignant neoplasm of bone: Secondary | ICD-10-CM | POA: Diagnosis not present

## 2020-08-16 ENCOUNTER — Ambulatory Visit
Admission: RE | Admit: 2020-08-16 | Discharge: 2020-08-16 | Disposition: A | Discharge status: Home / Self Care | Payer: Medicare Other | Source: Ambulatory Visit | Attending: Radiation Oncology | Admitting: Radiation Oncology

## 2020-08-16 ENCOUNTER — Encounter: Payer: Medicare Other | Admitting: Physical Therapy

## 2020-08-16 DIAGNOSIS — C649 Malignant neoplasm of unspecified kidney, except renal pelvis: Secondary | ICD-10-CM

## 2020-08-16 DIAGNOSIS — C7951 Secondary malignant neoplasm of bone: Secondary | ICD-10-CM | POA: Diagnosis not present

## 2020-08-17 ENCOUNTER — Other Ambulatory Visit: Payer: Self-pay

## 2020-08-17 ENCOUNTER — Ambulatory Visit
Admission: RE | Admit: 2020-08-17 | Discharge: 2020-08-17 | Disposition: A | Discharge status: Home / Self Care | Payer: Medicare Other | Source: Ambulatory Visit | Attending: Radiation Oncology | Admitting: Radiation Oncology

## 2020-08-17 DIAGNOSIS — C7951 Secondary malignant neoplasm of bone: Secondary | ICD-10-CM | POA: Diagnosis not present

## 2020-08-20 ENCOUNTER — Ambulatory Visit
Admission: RE | Admit: 2020-08-20 | Discharge: 2020-08-20 | Disposition: A | Discharge status: Home / Self Care | Payer: Medicare Other | Source: Ambulatory Visit | Attending: Radiation Oncology | Admitting: Radiation Oncology

## 2020-08-20 ENCOUNTER — Encounter: Payer: Medicare Other | Admitting: Physical Therapy

## 2020-08-20 DIAGNOSIS — C7951 Secondary malignant neoplasm of bone: Secondary | ICD-10-CM | POA: Diagnosis not present

## 2020-08-21 ENCOUNTER — Ambulatory Visit
Admission: RE | Admit: 2020-08-21 | Discharge: 2020-08-21 | Disposition: A | Discharge status: Home / Self Care | Payer: Medicare Other | Source: Ambulatory Visit | Attending: Radiation Oncology | Admitting: Radiation Oncology

## 2020-08-21 ENCOUNTER — Other Ambulatory Visit: Payer: Self-pay

## 2020-08-21 DIAGNOSIS — C7951 Secondary malignant neoplasm of bone: Secondary | ICD-10-CM | POA: Diagnosis not present

## 2020-08-22 ENCOUNTER — Ambulatory Visit
Admission: RE | Admit: 2020-08-22 | Discharge: 2020-08-22 | Disposition: A | Discharge status: Home / Self Care | Payer: Medicare Other | Source: Ambulatory Visit | Attending: Radiation Oncology | Admitting: Radiation Oncology

## 2020-08-22 DIAGNOSIS — C7951 Secondary malignant neoplasm of bone: Secondary | ICD-10-CM | POA: Diagnosis not present

## 2020-08-23 ENCOUNTER — Ambulatory Visit
Admission: RE | Admit: 2020-08-23 | Discharge: 2020-08-23 | Disposition: A | Discharge status: Home / Self Care | Payer: Medicare Other | Source: Ambulatory Visit | Attending: Radiation Oncology | Admitting: Radiation Oncology

## 2020-08-23 ENCOUNTER — Encounter: Payer: Medicare Other | Admitting: Physical Therapy

## 2020-08-23 DIAGNOSIS — C7951 Secondary malignant neoplasm of bone: Secondary | ICD-10-CM | POA: Diagnosis not present

## 2020-08-24 ENCOUNTER — Ambulatory Visit
Admission: RE | Admit: 2020-08-24 | Discharge: 2020-08-24 | Disposition: A | Discharge status: Home / Self Care | Payer: Medicare Other | Source: Ambulatory Visit | Attending: Radiation Oncology | Admitting: Radiation Oncology

## 2020-08-24 DIAGNOSIS — C7951 Secondary malignant neoplasm of bone: Secondary | ICD-10-CM | POA: Diagnosis not present

## 2020-08-27 ENCOUNTER — Ambulatory Visit
Admission: RE | Admit: 2020-08-27 | Discharge: 2020-08-27 | Disposition: A | Discharge status: Home / Self Care | Payer: Medicare Other | Source: Ambulatory Visit | Attending: Radiation Oncology | Admitting: Radiation Oncology

## 2020-08-27 ENCOUNTER — Encounter: Payer: Medicare Other | Admitting: Physical Therapy

## 2020-08-27 DIAGNOSIS — C7951 Secondary malignant neoplasm of bone: Secondary | ICD-10-CM | POA: Diagnosis not present

## 2020-08-28 ENCOUNTER — Other Ambulatory Visit: Payer: Self-pay

## 2020-08-28 ENCOUNTER — Ambulatory Visit
Admission: RE | Admit: 2020-08-28 | Discharge: 2020-08-28 | Disposition: A | Discharge status: Home / Self Care | Payer: Medicare Other | Source: Ambulatory Visit | Attending: Radiation Oncology | Admitting: Radiation Oncology

## 2020-08-28 DIAGNOSIS — C7951 Secondary malignant neoplasm of bone: Secondary | ICD-10-CM | POA: Diagnosis not present

## 2020-08-29 ENCOUNTER — Ambulatory Visit
Admission: RE | Admit: 2020-08-29 | Discharge: 2020-08-29 | Disposition: A | Discharge status: Home / Self Care | Payer: Medicare Other | Source: Ambulatory Visit | Attending: Radiation Oncology | Admitting: Radiation Oncology

## 2020-08-29 ENCOUNTER — Encounter: Payer: Self-pay | Admitting: Radiation Oncology

## 2020-08-29 DIAGNOSIS — C7951 Secondary malignant neoplasm of bone: Secondary | ICD-10-CM | POA: Diagnosis not present

## 2020-08-30 ENCOUNTER — Encounter: Payer: Medicare Other | Admitting: Physical Therapy

## 2020-08-31 ENCOUNTER — Ambulatory Visit (HOSPITAL_COMMUNITY)
Admission: RE | Admit: 2020-08-31 | Discharge: 2020-08-31 | Disposition: A | Discharge status: Home / Self Care | Payer: Medicare Other | Source: Ambulatory Visit | Attending: Oncology | Admitting: Oncology

## 2020-08-31 ENCOUNTER — Other Ambulatory Visit: Payer: Self-pay

## 2020-08-31 ENCOUNTER — Encounter (HOSPITAL_COMMUNITY): Payer: Self-pay

## 2020-08-31 ENCOUNTER — Inpatient Hospital Stay: Payer: Medicare Other | Attending: Oncology

## 2020-08-31 DIAGNOSIS — C7951 Secondary malignant neoplasm of bone: Secondary | ICD-10-CM | POA: Insufficient documentation

## 2020-08-31 DIAGNOSIS — C649 Malignant neoplasm of unspecified kidney, except renal pelvis: Secondary | ICD-10-CM

## 2020-08-31 DIAGNOSIS — N2889 Other specified disorders of kidney and ureter: Secondary | ICD-10-CM | POA: Insufficient documentation

## 2020-08-31 DIAGNOSIS — C641 Malignant neoplasm of right kidney, except renal pelvis: Secondary | ICD-10-CM | POA: Diagnosis not present

## 2020-08-31 DIAGNOSIS — C7802 Secondary malignant neoplasm of left lung: Secondary | ICD-10-CM | POA: Diagnosis not present

## 2020-08-31 DIAGNOSIS — R918 Other nonspecific abnormal finding of lung field: Secondary | ICD-10-CM | POA: Insufficient documentation

## 2020-08-31 LAB — CBC WITH DIFFERENTIAL (CANCER CENTER ONLY)
Abs Immature Granulocytes: 0.02 10*3/uL (ref 0.00–0.07)
Basophils Absolute: 0 10*3/uL (ref 0.0–0.1)
Basophils Relative: 1 %
Eosinophils Absolute: 0.2 10*3/uL (ref 0.0–0.5)
Eosinophils Relative: 5 %
HCT: 37.3 % — ABNORMAL LOW (ref 39.0–52.0)
Hemoglobin: 13.8 g/dL (ref 13.0–17.0)
Immature Granulocytes: 0 %
Lymphocytes Relative: 18 %
Lymphs Abs: 0.8 10*3/uL (ref 0.7–4.0)
MCH: 34.5 pg — ABNORMAL HIGH (ref 26.0–34.0)
MCHC: 37 g/dL — ABNORMAL HIGH (ref 30.0–36.0)
MCV: 93.3 fL (ref 80.0–100.0)
Monocytes Absolute: 0.4 10*3/uL (ref 0.1–1.0)
Monocytes Relative: 8 %
Neutro Abs: 3 10*3/uL (ref 1.7–7.7)
Neutrophils Relative %: 68 %
Platelet Count: 162 10*3/uL (ref 150–400)
RBC: 4 MIL/uL — ABNORMAL LOW (ref 4.22–5.81)
RDW: 18 % — ABNORMAL HIGH (ref 11.5–15.5)
WBC Count: 4.5 10*3/uL (ref 4.0–10.5)
nRBC: 0 % (ref 0.0–0.2)

## 2020-08-31 LAB — CMP (CANCER CENTER ONLY)
ALT: 45 U/L — ABNORMAL HIGH (ref 0–44)
AST: 39 U/L (ref 15–41)
Albumin: 3.5 g/dL (ref 3.5–5.0)
Alkaline Phosphatase: 87 U/L (ref 38–126)
Anion gap: 10 (ref 5–15)
BUN: 11 mg/dL (ref 8–23)
CO2: 27 mmol/L (ref 22–32)
Calcium: 8.3 mg/dL — ABNORMAL LOW (ref 8.9–10.3)
Chloride: 102 mmol/L (ref 98–111)
Creatinine: 1.2 mg/dL (ref 0.61–1.24)
GFR, Estimated: >60 mL/min (ref >60)
Glucose, Bld: 96 mg/dL (ref 70–99)
Potassium: 3.7 mmol/L (ref 3.5–5.1)
Sodium: 139 mmol/L (ref 135–145)
Total Bilirubin: 0.8 mg/dL (ref 0.3–1.2)
Total Protein: 7.1 g/dL (ref 6.5–8.1)

## 2020-08-31 IMAGING — CT CT ABD-PEL WO/W CM
3 of 13 series · 10 of 46 positions shown, 16 images · IV contrast (omnipaque)
Comparison: [DATE]

CLINICAL DATA: Follow-up metastatic renal cell carcinoma. Ongoing
radiation therapy and chemotherapy.

EXAM:
CT CHEST WITH CONTRAST
CT ABDOMEN AND PELVIS WITH AND WITHOUT CONTRAST
TECHNIQUE: Multidetector CT imaging of the chest was performed during
intravenous contrast administration. Multidetector CT imaging of the
abdomen and pelvis was performed following the standard protocol
before and during bolus administration of intravenous contrast.
CONTRAST:  100mL OMNIPAQUE IOHEXOL 300 MG/ML  SOLN

[Series 2: axial pre · axial · non-contrast · 0.84mm/px · z∈[+1436,+1520]mm · 2 of 85 slices shown]
[im 29/85  soft-tissue]
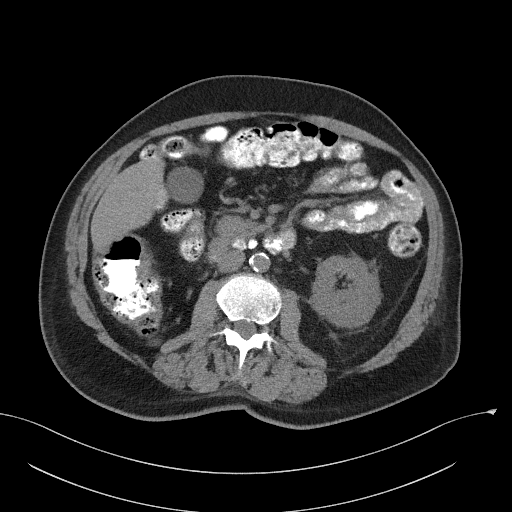
[im 57/85  soft-tissue]
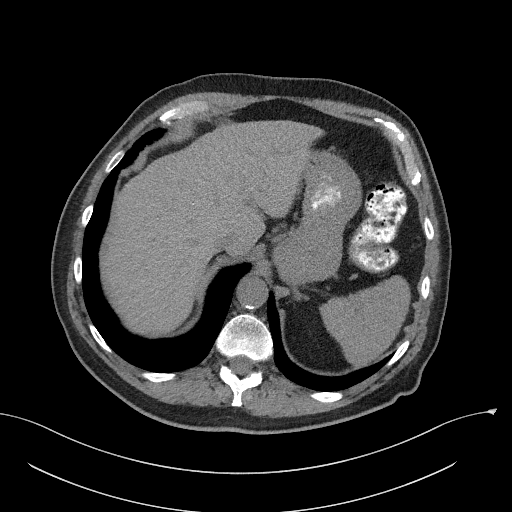

[Series 3: coronal pre · coronal · non-contrast · 0.50mm/px · 2 of 101 slices shown, 3 images]
[im 34/101  soft-tissue]
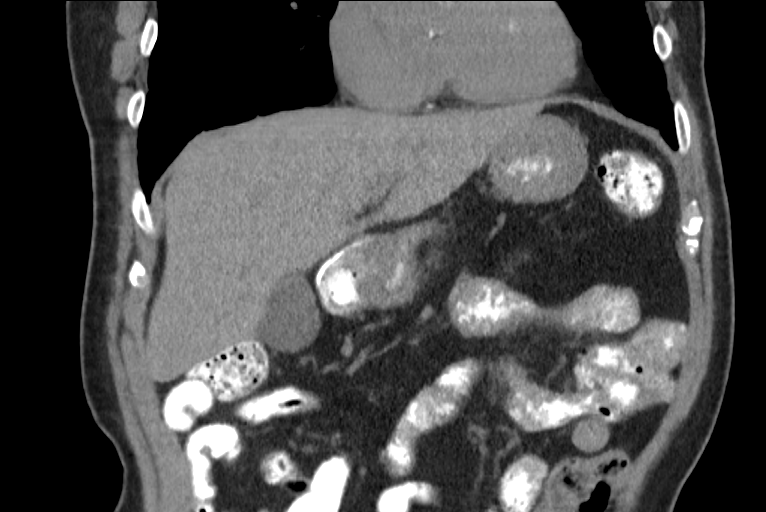
[im 34/101  bone]
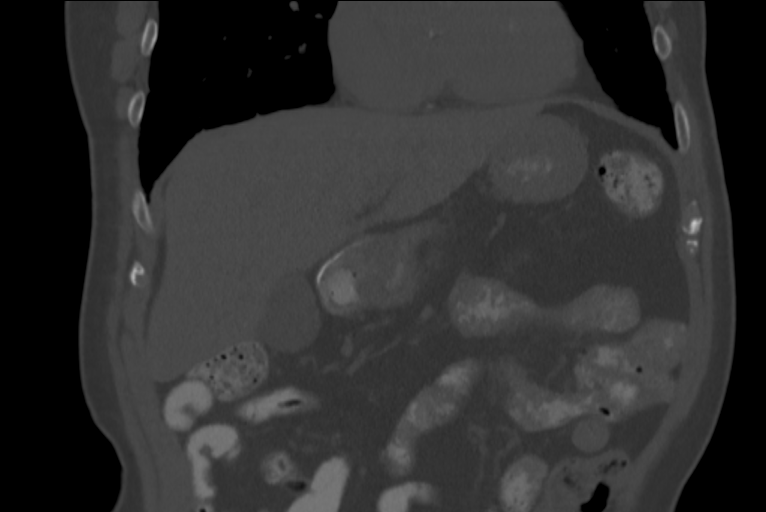
[im 67/101  soft-tissue]
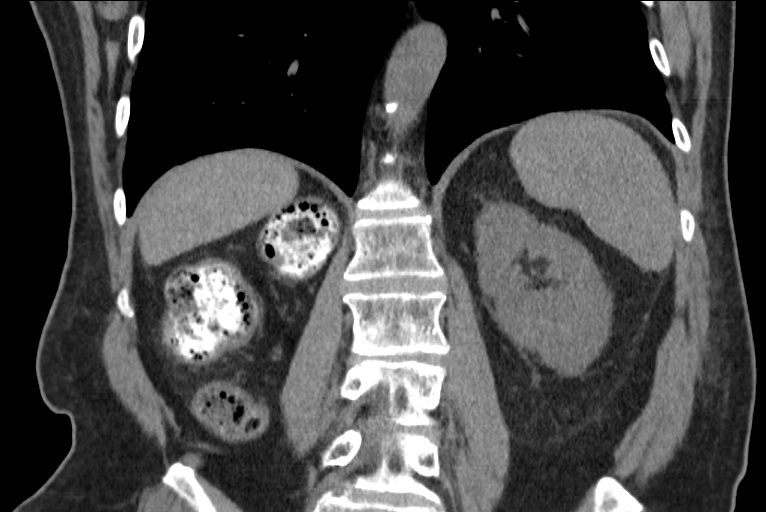

[Series 11: axial nephro · axial · 0.84mm/px · z∈[+1242,+1677]mm · 6 of 205 slices shown, 11 images]
[im 30/205  soft-tissue]
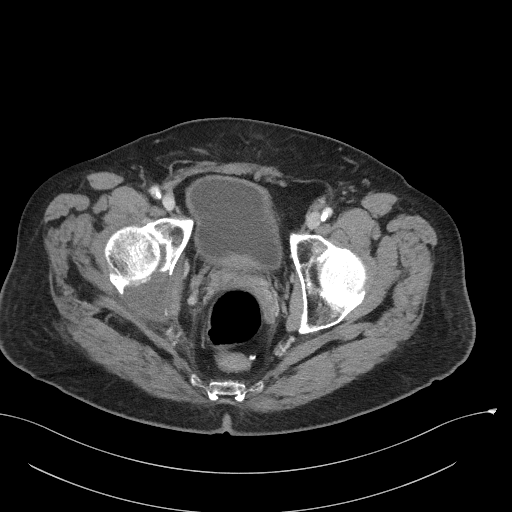
[im 30/205  bone]
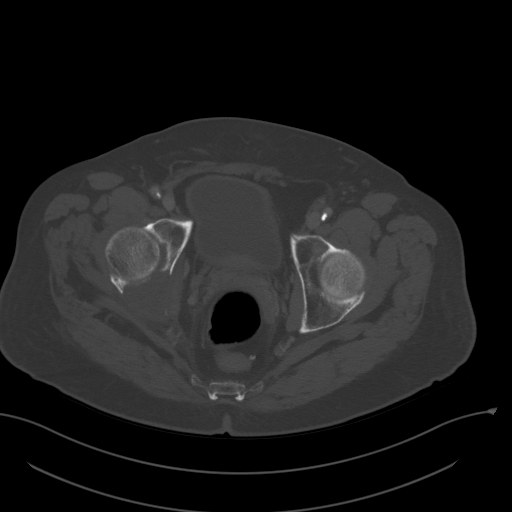
[im 59/205  soft-tissue]
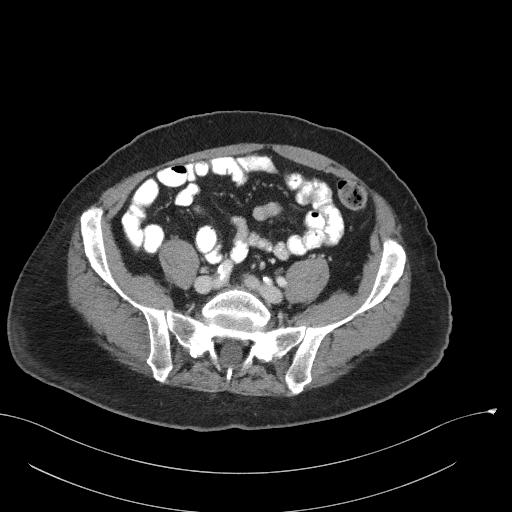
[im 88/205  soft-tissue]
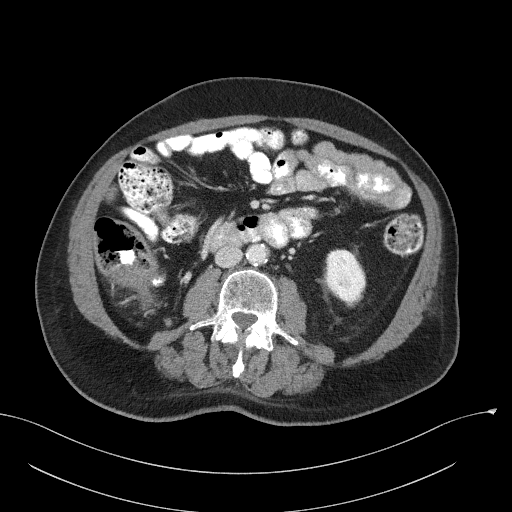
[im 88/205  lung]
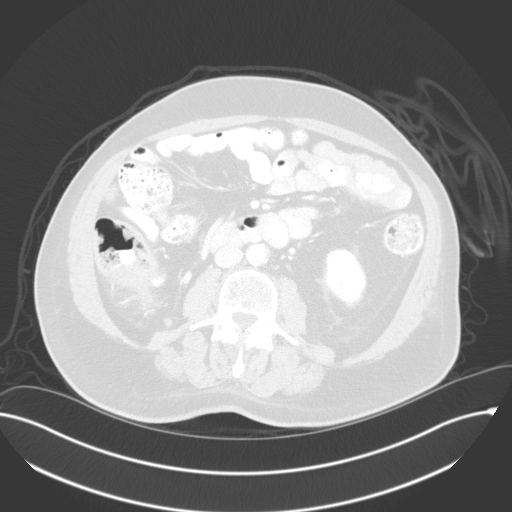
[im 117/205  soft-tissue]
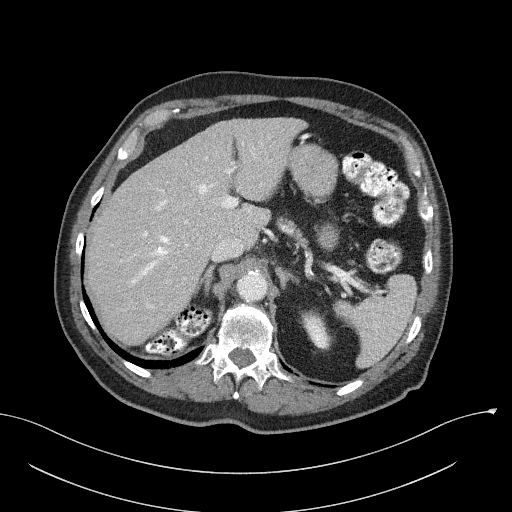
[im 117/205  lung]
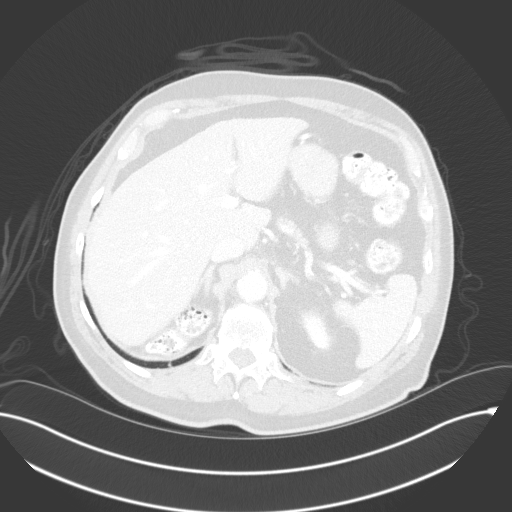
[im 146/205  soft-tissue]
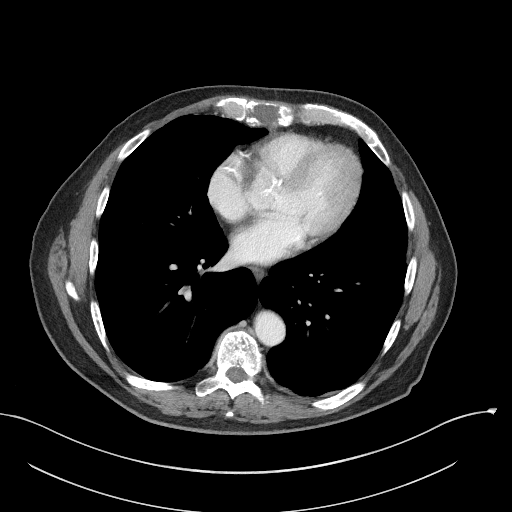
[im 146/205  lung]
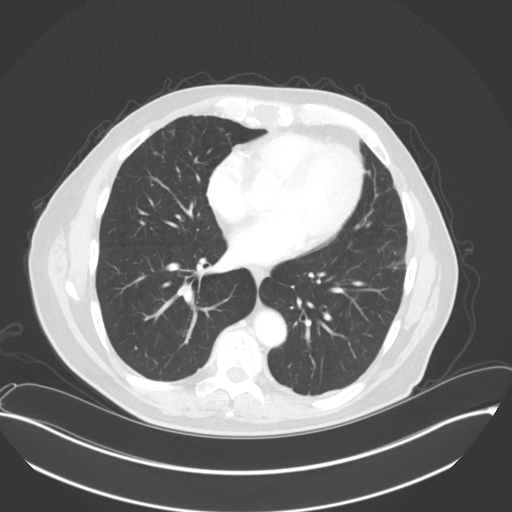
[im 175/205  soft-tissue]
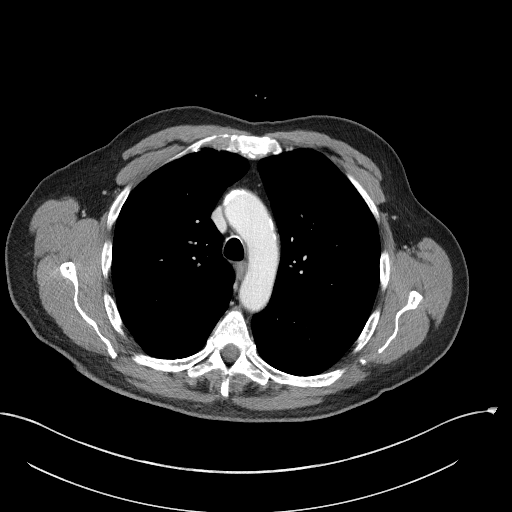
[im 175/205  lung]
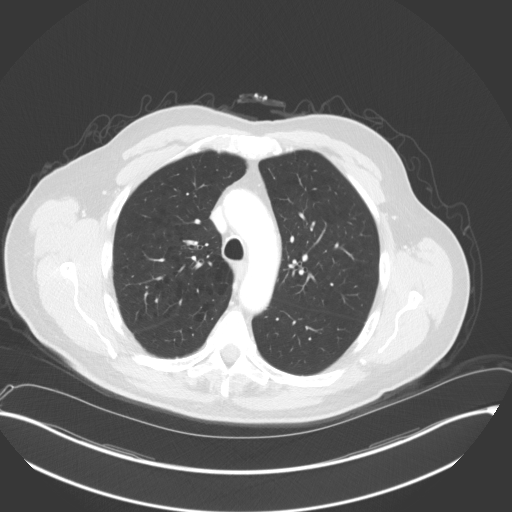

[10 of 46 positions shown; findings below may reference images not displayed]

FINDINGS: CT CHEST FINDINGS

Cardiovascular: No acute findings. Aortic and coronary
atherosclerotic calcification noted.

Mediastinum/Lymph Nodes: No masses or pathologically enlarged lymph
nodes identified.

Lungs/Pleura: Mild emphysema and right apical scarring remains
stable. Previously seen tiny bilateral pulmonary nodules show
decreased size since previous study. Index nodule in the posterior
left costophrenic sulcus currently measures 3 mm on image 127/15,
compared to 11 mm previously. No new or enlarging pulmonary nodules
or masses identified. No evidence of pulmonary infiltrate or pleural
effusion.

Musculoskeletal:  No suspicious bone lesions identified.

CT ABDOMEN AND PELVIS FINDINGS

Hepatobiliary: No masses identified. Gallbladder is unremarkable. No
evidence of biliary ductal dilatation.

Pancreas:  No mass or inflammatory changes.

Spleen:  Within normal limits in size and appearance.

Adrenals/Urinary tract: Prior right nephrectomy again noted. No
masses seen within the nephrectomy bed. Left kidney and both adrenal
glands are normal in appearance. Unremarkable unopacified urinary
bladder.

Stomach/Bowel: No evidence of obstruction, inflammatory process, or
abnormal fluid collections. Diverticulosis is seen mainly involving
the sigmoid colon, however there is no evidence of diverticulitis.

Vascular/Lymphatic: No pathologically enlarged lymph nodes
identified. No abdominal aortic aneurysm. Aortic atherosclerotic
calcification noted.

Reproductive:  No mass or other significant abnormality identified.

Other:  None.

Musculoskeletal: Increased size of lytic bone metastasis in the T3
vertebral body noted. New lytic bone metastasis is seen involving
the right lamina of L3. Lytic bone metastases involving the sternum
show no significant change. Decreased size of soft tissue mass
associated with lytic metastasis in the right ischial tuberosity is
noted, currently measuring 6.7 x 3.2 cm, compared to 7.1 x 4.3 cm
previously.
IMPRESSION: Mild decrease in tiny bilateral pulmonary metastases.

No new or progressive soft tissue metastatic disease within the
chest, abdomen, or pelvis.

Mixed response of lytic bone metastases, with new or progressive
lytic bone metastases involving the T3 and L3 vertebra. Decreased
size of soft tissue mass associated with lytic metastasis in the
right ischial tuberosity.

Aortic Atherosclerosis ([UI]-[UI]) and Emphysema ([UI]-[UI]).

## 2020-08-31 IMAGING — CT CT CHEST W/ CM
3 of 13 series · 10 of 46 positions shown, 16 images · IV contrast (omnipaque)
Comparison: [DATE]

CLINICAL DATA: Follow-up metastatic renal cell carcinoma. Ongoing
radiation therapy and chemotherapy.

EXAM:
CT CHEST WITH CONTRAST
CT ABDOMEN AND PELVIS WITH AND WITHOUT CONTRAST
TECHNIQUE: Multidetector CT imaging of the chest was performed during
intravenous contrast administration. Multidetector CT imaging of the
abdomen and pelvis was performed following the standard protocol
before and during bolus administration of intravenous contrast.
CONTRAST:  100mL OMNIPAQUE IOHEXOL 300 MG/ML  SOLN

[Series 2: axial pre · axial · non-contrast · 0.84mm/px · z∈[+1436,+1520]mm · 2 of 85 slices shown]
[im 29/85  soft-tissue]
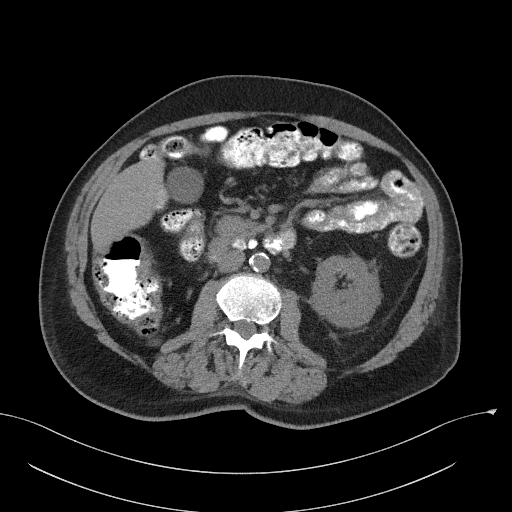
[im 57/85  soft-tissue]
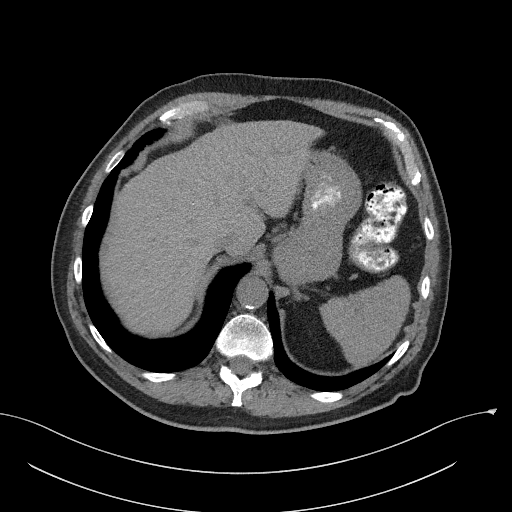

[Series 3: coronal pre · coronal · non-contrast · 0.50mm/px · 2 of 101 slices shown, 3 images]
[im 34/101  soft-tissue]
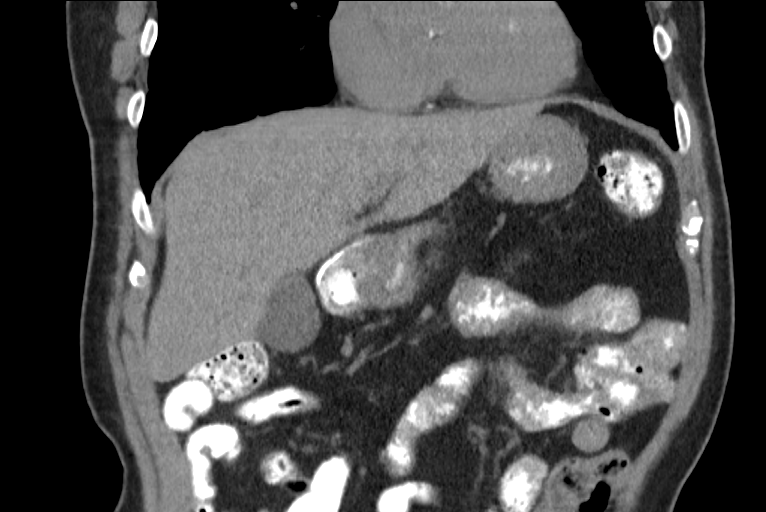
[im 34/101  bone]
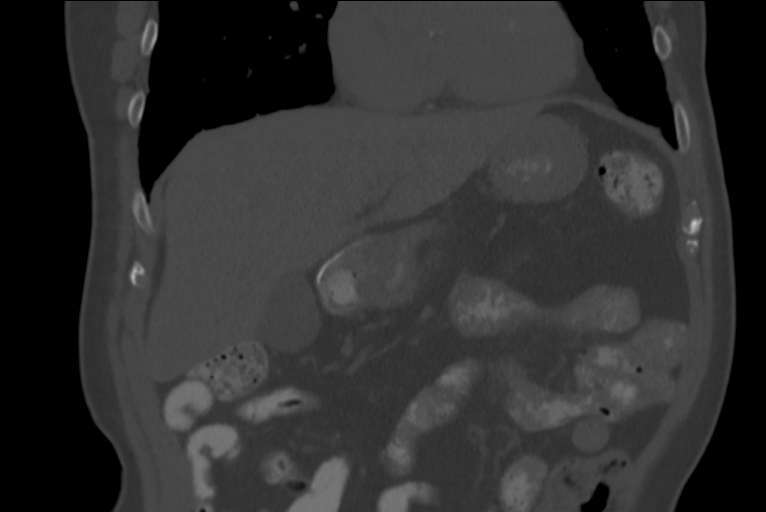
[im 67/101  soft-tissue]
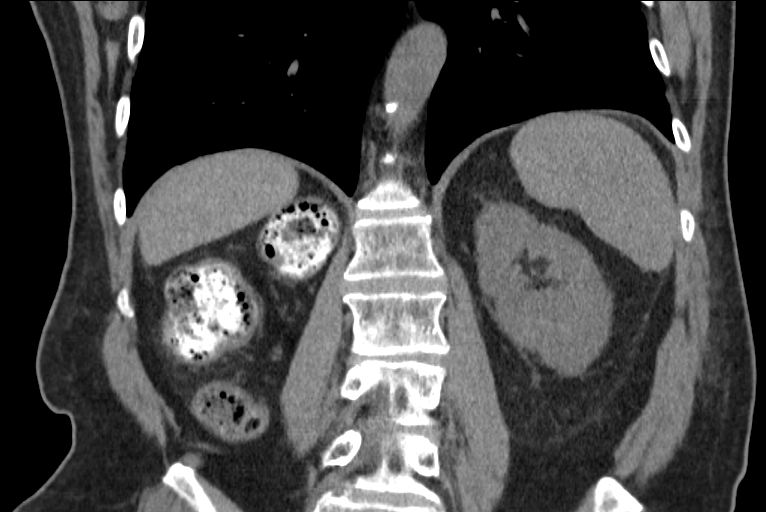

[Series 11: axial nephro · axial · 0.84mm/px · z∈[+1242,+1677]mm · 6 of 205 slices shown, 11 images]
[im 30/205  soft-tissue]
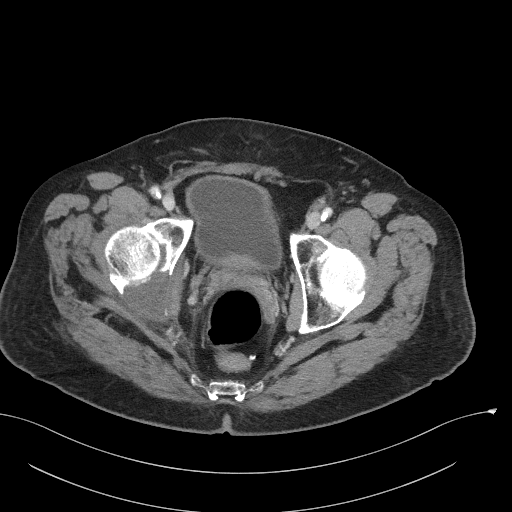
[im 30/205  bone]
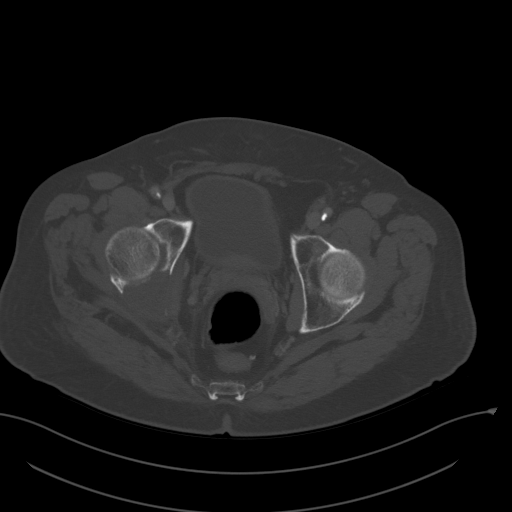
[im 59/205  soft-tissue]
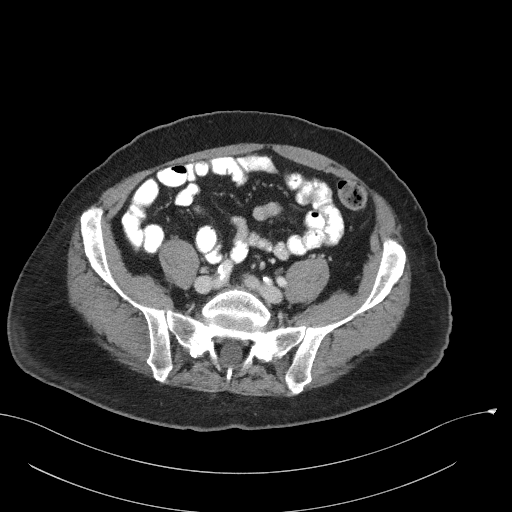
[im 88/205  soft-tissue]
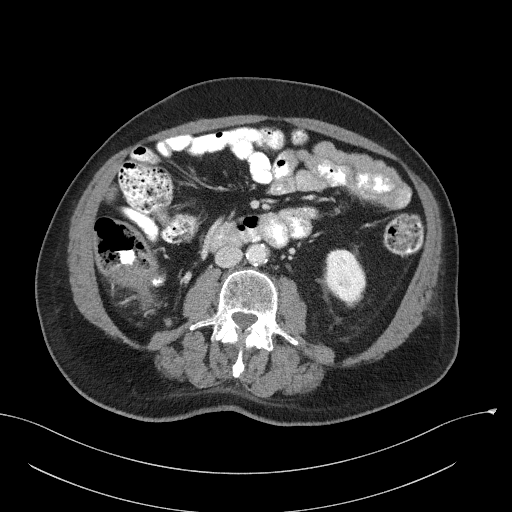
[im 88/205  lung]
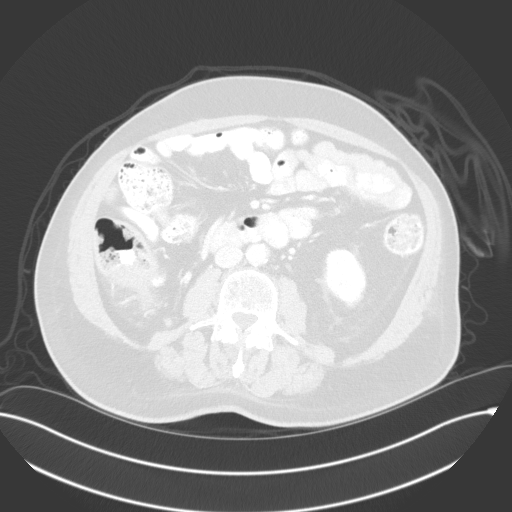
[im 117/205  soft-tissue]
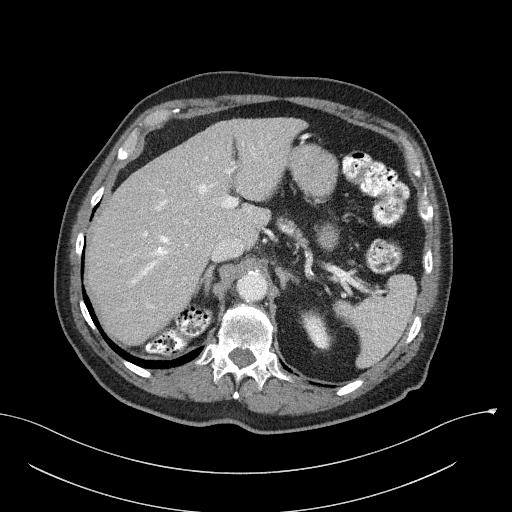
[im 117/205  lung]
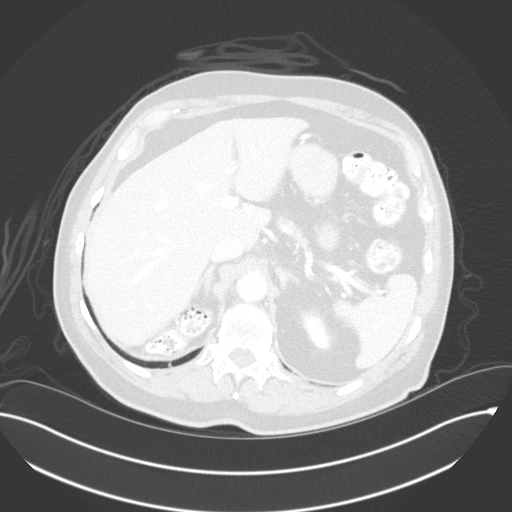
[im 146/205  soft-tissue]
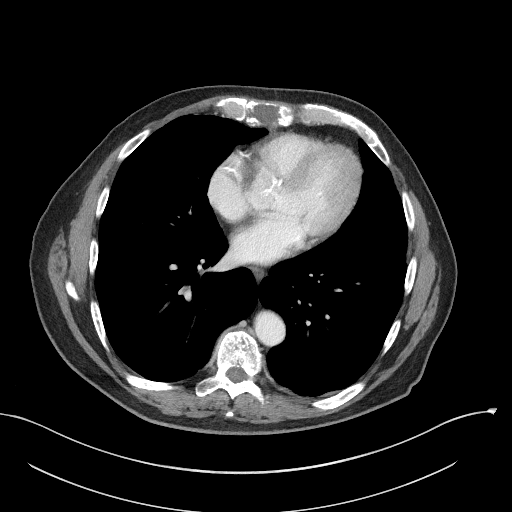
[im 146/205  lung]
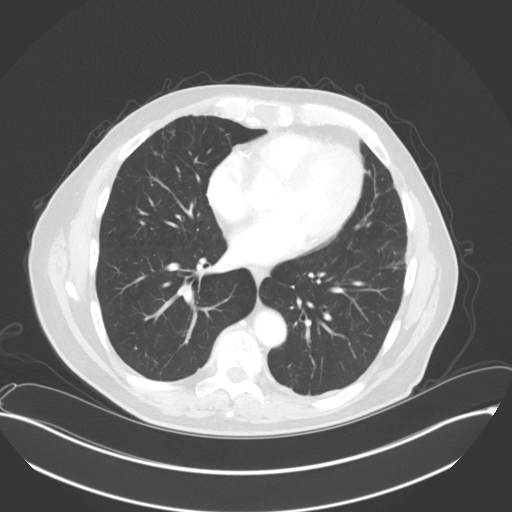
[im 175/205  soft-tissue]
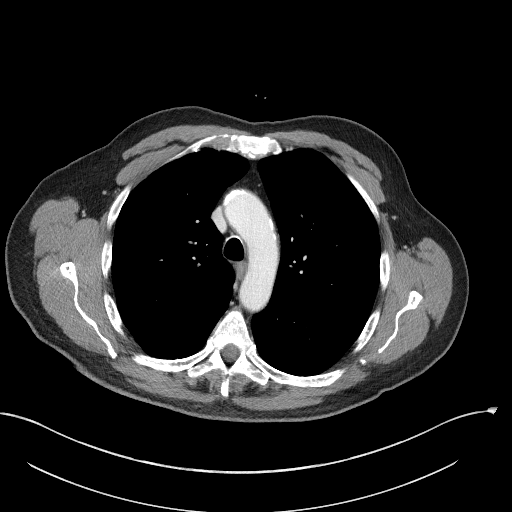
[im 175/205  lung]
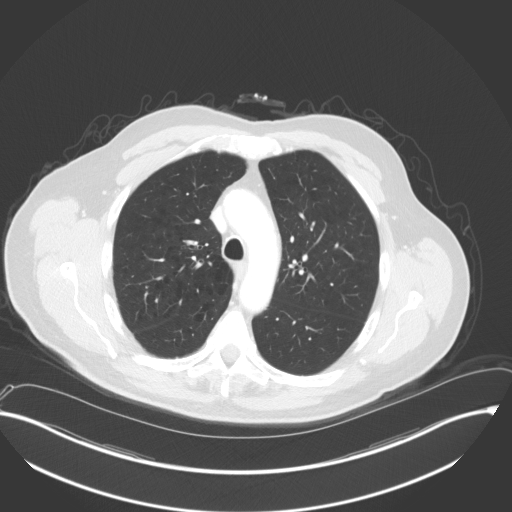

[10 of 46 positions shown; findings below may reference images not displayed]

FINDINGS: CT CHEST FINDINGS

Cardiovascular: No acute findings. Aortic and coronary
atherosclerotic calcification noted.

Mediastinum/Lymph Nodes: No masses or pathologically enlarged lymph
nodes identified.

Lungs/Pleura: Mild emphysema and right apical scarring remains
stable. Previously seen tiny bilateral pulmonary nodules show
decreased size since previous study. Index nodule in the posterior
left costophrenic sulcus currently measures 3 mm on image 127/15,
compared to 11 mm previously. No new or enlarging pulmonary nodules
or masses identified. No evidence of pulmonary infiltrate or pleural
effusion.

Musculoskeletal:  No suspicious bone lesions identified.

CT ABDOMEN AND PELVIS FINDINGS

Hepatobiliary: No masses identified. Gallbladder is unremarkable. No
evidence of biliary ductal dilatation.

Pancreas:  No mass or inflammatory changes.

Spleen:  Within normal limits in size and appearance.

Adrenals/Urinary tract: Prior right nephrectomy again noted. No
masses seen within the nephrectomy bed. Left kidney and both adrenal
glands are normal in appearance. Unremarkable unopacified urinary
bladder.

Stomach/Bowel: No evidence of obstruction, inflammatory process, or
abnormal fluid collections. Diverticulosis is seen mainly involving
the sigmoid colon, however there is no evidence of diverticulitis.

Vascular/Lymphatic: No pathologically enlarged lymph nodes
identified. No abdominal aortic aneurysm. Aortic atherosclerotic
calcification noted.

Reproductive:  No mass or other significant abnormality identified.

Other:  None.

Musculoskeletal: Increased size of lytic bone metastasis in the T3
vertebral body noted. New lytic bone metastasis is seen involving
the right lamina of L3. Lytic bone metastases involving the sternum
show no significant change. Decreased size of soft tissue mass
associated with lytic metastasis in the right ischial tuberosity is
noted, currently measuring 6.7 x 3.2 cm, compared to 7.1 x 4.3 cm
previously.
IMPRESSION: Mild decrease in tiny bilateral pulmonary metastases.

No new or progressive soft tissue metastatic disease within the
chest, abdomen, or pelvis.

Mixed response of lytic bone metastases, with new or progressive
lytic bone metastases involving the T3 and L3 vertebra. Decreased
size of soft tissue mass associated with lytic metastasis in the
right ischial tuberosity.

Aortic Atherosclerosis ([UI]-[UI]) and Emphysema ([UI]-[UI]).

## 2020-08-31 MED ORDER — IOHEXOL 300 MG/ML  SOLN
100.0000 mL | Freq: Once | INTRAMUSCULAR | Status: AC | PRN
  Administered 2020-08-31: 100 mL via INTRAVENOUS

## 2020-09-04 ENCOUNTER — Other Ambulatory Visit: Payer: Self-pay | Admitting: Oncology

## 2020-09-05 ENCOUNTER — Other Ambulatory Visit: Payer: Self-pay

## 2020-09-05 ENCOUNTER — Inpatient Hospital Stay: Payer: Medicare Other | Attending: Oncology | Admitting: Oncology

## 2020-09-05 VITALS — BP 115/81 | HR 97 | Temp 97.8°F | Resp 14 | Ht 69.0 in | Wt 193.1 lb

## 2020-09-05 DIAGNOSIS — C641 Malignant neoplasm of right kidney, except renal pelvis: Secondary | ICD-10-CM | POA: Diagnosis present

## 2020-09-05 DIAGNOSIS — Z5112 Encounter for antineoplastic immunotherapy: Secondary | ICD-10-CM | POA: Diagnosis not present

## 2020-09-05 DIAGNOSIS — Z7189 Other specified counseling: Secondary | ICD-10-CM

## 2020-09-05 DIAGNOSIS — E039 Hypothyroidism, unspecified: Secondary | ICD-10-CM | POA: Diagnosis not present

## 2020-09-05 DIAGNOSIS — C649 Malignant neoplasm of unspecified kidney, except renal pelvis: Secondary | ICD-10-CM | POA: Diagnosis not present

## 2020-09-05 DIAGNOSIS — C7951 Secondary malignant neoplasm of bone: Secondary | ICD-10-CM | POA: Insufficient documentation

## 2020-09-05 DIAGNOSIS — C7801 Secondary malignant neoplasm of right lung: Secondary | ICD-10-CM | POA: Insufficient documentation

## 2020-09-05 DIAGNOSIS — Z79899 Other long term (current) drug therapy: Secondary | ICD-10-CM | POA: Diagnosis not present

## 2020-09-05 DIAGNOSIS — C7802 Secondary malignant neoplasm of left lung: Secondary | ICD-10-CM | POA: Insufficient documentation

## 2020-09-05 NOTE — Progress Notes (Signed)
START ON PATHWAY REGIMEN - Renal Cell     A cycle is every 28 days:     Nivolumab   **Always confirm dose/schedule in your pharmacy ordering system**  Patient Characteristics: Stage IV/Metastatic Disease, Clear Cell, Second Line, No Prior Checkpoint Inhibitor Therapeutic Status: Stage IV/Metastatic Disease Histology: Clear Cell Line of Therapy: Second Line Intent of Therapy: Non-Curative / Palliative Intent, Discussed with Patient

## 2020-09-05 NOTE — Progress Notes (Signed)
Referral to PT placed per Dr. Alen Blew.

## 2020-09-05 NOTE — Progress Notes (Signed)
Hematology and Oncology Follow Up Visit  James Massey [id number] [birth year] 75 y.o. 09/05/2020 3:19 PM James Massey, MDShadad, James Dad, MD   Principle Diagnosis: 57 year old man with kidney cancer diagnosed and 2019.  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     Prior Therapy:  He is status post laparoscopic right radical nephrectomy on May 31, 2018.  He is status post right hip and left lung radiation therapy completed in August 2021.  He received 50 Gray in 5 fractions  He is status post radiation to the right posterior acetabular lesion completed in November 2021.  He received at 50 Gray in 5 fractions.  He status post radiation therapy to the right and the left proximal femur for a total of 30 Gray in 10 fractions completed in February 2022.  Current therapy: Cabometyx 40 mg daily started on 05/28/2020.  Interim History: [name] is here for return evaluation.  Since the last visit, he completed radiation therapy without any major complaints.  He denies any nausea vomiting bone pain.  He does report some sporadic knee pain related to arthritis.  He has reported some lower extremity edema bilaterally.  His performance status still limited physical ability and has been using a walker to ambulate.  He will be resuming physical therapy in the near future.  He denies any complications related to Cabometyx.  He denies any nausea, vomiting or diarrhea.  Denies excessive fatigue tiredness.    Medications: Unchanged on review. Current Outpatient Medications  Medication Sig Dispense Refill  . allopurinol (ZYLOPRIM) 300 MG tablet Take 300 mg by mouth at bedtime.     Marland Kitchen amLODipine (NORVASC) 10 MG tablet Take 1 tablet (10 mg total) by mouth daily. 180 tablet 3  . atorvastatin (LIPITOR) 40 MG tablet Take 40 mg by mouth at bedtime.     . CABOMETYX 40 MG tablet TAKE 1 TABLET (40 MG TOTAL) BY MOUTH DAILY. TAKE ON AN  EMPTY STOMACH, 1 HOUR BEFORE OR 2 HOURS AFTER MEALS. 30 tablet 0  . colchicine 0.6 MG tablet Take 2 tabs once followed by 1 tab an hour later for gout flare (total 1.8 mg dose/course). Do not repeat for at least 3 days.    . diazepam (VALIUM) 5 MG tablet Take 2.5-5 mg by mouth every 12 (twelve) hours as needed (for eye twitching).     . hydrocortisone 2.5 % cream Apply 1 application topically 2 (two) times daily as needed (for hemorroidal flare ups).    . magnesium 30 MG tablet Take 30 mg by mouth 2 (two) times daily.    . Multiple Vitamin (MULTIVITAMIN) tablet Take 1 tablet by mouth daily.    . pantoprazole (PROTONIX) 40 MG tablet Take 40 mg by mouth daily.    . pseudoephedrine-acetaminophen (TYLENOL SINUS) 30-500 MG TABS tablet Take 1-2 tablets by mouth every 4 (four) hours as needed (for sinus issues).    . Simethicone 180 MG CAPS Take 180 mg by mouth 3 (three) times daily as needed (for gas/indigestion.).    Marland Kitchen traMADol (ULTRAM) 50 MG tablet Take 1 tablet (50 mg total) by mouth every 6 (six) hours as needed. 60 tablet 0   No current facility-administered medications for this visit.     Allergies:  Allergies  Allergen Reactions  . Bee Venom Swelling  . Budesonide-Formoterol Fumarate Other (See Comments)    RESPIRATORY ISSUES  . [place]  oak and ivy  . Clindamycin/Lincomycin Rash     Physical Exam:   Blood pressure 115/81, pulse 97, temperature 97.8 F (36.6 C), temperature source Tympanic, resp. rate 14, height 5\' 9"  (1.753 m), weight 193 lb 1.6 oz (87.6 kg), SpO2 100 %.     ECOG: 1    General appearance: Alert, awake without any distress. Head: Atraumatic without abnormalities Oropharynx: Without any thrush or ulcers. Eyes: No scleral icterus. Lymph nodes: No lymphadenopathy noted in the cervical, supraclavicular, or axillary nodes Heart:regular rate and rhythm, without any murmurs or gallops.   Lung: Clear to auscultation without any rhonchi,  wheezes or dullness to percussion. Abdomin: Soft, nontender without any shifting dullness or ascites. Musculoskeletal: No clubbing or cyanosis. Neurological: No motor or sensory deficits. Skin: No rashes or lesions.          Lab Results: Lab Results  Component Value Date   WBC 4.5 08/31/2020   HGB 13.8 08/31/2020   HCT 37.3 (L) 08/31/2020   MCV 93.3 08/31/2020   PLT 162 08/31/2020     Chemistry      Component Value Date/Time   NA 139 08/31/2020 1111   K 3.7 08/31/2020 1111   CL 102 08/31/2020 1111   CO2 27 08/31/2020 1111   BUN 11 08/31/2020 1111   CREATININE 1.20 08/31/2020 1111      Component Value Date/Time   CALCIUM 8.3 (L) 08/31/2020 1111   ALKPHOS 87 08/31/2020 1111   AST 39 08/31/2020 1111   ALT 45 (H) 08/31/2020 1111   BILITOT 0.8 08/31/2020 1111      IMPRESSION: Mild decrease in tiny bilateral pulmonary metastases.  No new or progressive soft tissue metastatic disease within the chest, abdomen, or pelvis.  Mixed response of lytic bone metastases, with new or progressive lytic bone metastases involving the T3 and L3 vertebra. Decreased size of soft tissue mass associated with lytic metastasis in the right ischial tuberosity.  Aortic Atherosclerosis (ICD10-I70.0) and Emphysema (ICD10-J43.9).  Impression and Plan:   76 year old man with:  1.    Immune cancer diagnosed in 2019.  He has developed stage IV clear-cell with bone involvement and pulmonary disease diagnosed in 2021.   He is currently on cabozantinib without any major complication.  CT scan obtained on February 25 was reviewed and showed mixed response with decrease in his pulmonary nodules but progressive disease in bone lesion.  He has developed bilateral femoral lesions as well as a T3 and L3 vertebra.  Treatment options moving forward were discussed at this time.  These would include continuing cabozantinib alone, adding nivolumab or switching to ipilimumab and nivolumab  therapy.  After discussion today, he is agreeable to proceed with the addition of nivolumab to cabozantinib.  Complication associated with this therapy include nausea, fatigue immune mediated complications and dermatitis.  We will tentatively start in the near future.    3.  Prognosis and goals of care: Therapy remains palliative low risk meds are warranted given his reasonable performance status.  4.  Diarrhea prophylaxis: I continue to educate him about the complication associated with his medication and diarrhea.  5.  Immune mediated complications: Reviewed his include pneumonitis, colitis or thyroid disease.  We will continue to educate him about these possibilities.  6.  Bone health: Recommended calcium and vitamin D supplements.  Will consider Xgeva after obtaining dental clearance of the start of nivolumab.  7. Follow up: Will be in the near future to infusion nivolumab.   30  minutes were  dedicated to this visit.  A time was spent reviewing the, laboratory data and outlining future plan of care.  [name], MD 3/2/20223:19 PM

## 2020-09-11 ENCOUNTER — Telehealth: Payer: Self-pay

## 2020-09-11 NOTE — Telephone Encounter (Signed)
Received call from pt's. wife Hoyle Sauer regarding Lab + Infusion appts. for 3/11; she requested these be cancelled because pt. will not be available on that day. Hoyle Sauer also stated that she would like for the chemo education class to be scheduled before the treatment. Overall, spouse would like scheduling to call and make the appts. with them over the phone.  Scheduling message sent relaying pt. and spouse request.   Per Dr. Alen Blew, okay to cancel Lab + Infusion appts. for 3/11, appts. cancelled.

## 2020-09-12 ENCOUNTER — Telehealth: Payer: Self-pay

## 2020-09-12 ENCOUNTER — Telehealth: Payer: Self-pay | Admitting: Oncology

## 2020-09-12 MED FILL — CABOMETYX 40 MG TABLET: 40 | 30 days supply | Qty: 30 | Fill #0

## 2020-09-12 NOTE — Telephone Encounter (Signed)
Oral Oncology Patient Advocate Encounter   Was successful in securing patient a $7000 grant from [employer] to provide copayment coverage for Cabometyx.  This will keep the out of pocket expense at $0.        The billing information is as follows and has been shared with [place].   Member ID: [id number] Group ID: [id number] RxBin: [id number] PCN: [id number] Dates of Eligibility: 09/12/20 through 09/12/21  Fund name:  Renal Cell.  James Massey Phone [phone] Fax [phone] 09/12/2020 12:59 PM

## 2020-09-12 NOTE — Progress Notes (Signed)
  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  The left proximal femur was treated to 30 Gy in 10 fractions of 3 Gy 2.  The right proximal femur was treated to 30 Gy in 10 fractions of 3 Gy  Beams/energy:       1.  The left proximal femur was treated using two static gantry angles of zero and 180 with MLC conformal 15 MV X-rays 2.  The right proximal femur was treated using two static gantry angles of zero and 180 with MLC conformal 15 MV X-rays  Narrative: The patient tolerated radiation treatment relatively well.     Plan: The patient has completed radiation treatment. The patient will return to radiation oncology clinic for routine followup in one month. I advised him to call or return sooner if he has any questions or concerns related to his recovery or treatment. ________________________________  Sheral Apley. Tammi Klippel, M.D.

## 2020-09-12 NOTE — Telephone Encounter (Signed)
Rescheduled 03/11 appointments to 03/17 per patients request, informed patient about appointments being in [place].

## 2020-09-14 ENCOUNTER — Ambulatory Visit: Payer: Medicare Other

## 2020-09-14 ENCOUNTER — Other Ambulatory Visit: Payer: Medicare Other

## 2020-09-17 ENCOUNTER — Other Ambulatory Visit: Payer: Medicare Other

## 2020-09-18 ENCOUNTER — Other Ambulatory Visit: Payer: Self-pay

## 2020-09-18 ENCOUNTER — Encounter: Payer: Self-pay | Admitting: Oncology

## 2020-09-18 ENCOUNTER — Inpatient Hospital Stay: Payer: Medicare Other

## 2020-09-18 NOTE — Progress Notes (Signed)
My card given to registration staff to provide to patient for financial resources while I was on a call.  Wrote information regarding [employer] on the back if interested in applying. My contact information on the card.

## 2020-09-20 ENCOUNTER — Other Ambulatory Visit: Payer: Self-pay

## 2020-09-20 ENCOUNTER — Inpatient Hospital Stay: Payer: Medicare Other

## 2020-09-20 VITALS — BP 135/83 | HR 95 | Temp 98.2°F | Resp 16

## 2020-09-20 DIAGNOSIS — C641 Malignant neoplasm of right kidney, except renal pelvis: Secondary | ICD-10-CM

## 2020-09-20 DIAGNOSIS — Z5112 Encounter for antineoplastic immunotherapy: Secondary | ICD-10-CM | POA: Diagnosis not present

## 2020-09-20 DIAGNOSIS — C649 Malignant neoplasm of unspecified kidney, except renal pelvis: Secondary | ICD-10-CM

## 2020-09-20 DIAGNOSIS — E039 Hypothyroidism, unspecified: Secondary | ICD-10-CM

## 2020-09-20 LAB — CBC WITH DIFFERENTIAL (CANCER CENTER ONLY)
Abs Immature Granulocytes: 0.03 10*3/uL (ref 0.00–0.07)
Basophils Absolute: 0 10*3/uL (ref 0.0–0.1)
Basophils Relative: 1 %
Eosinophils Absolute: 0.2 10*3/uL (ref 0.0–0.5)
Eosinophils Relative: 3 %
HCT: 33.6 % — ABNORMAL LOW (ref 39.0–52.0)
Hemoglobin: 12.2 g/dL — ABNORMAL LOW (ref 13.0–17.0)
Immature Granulocytes: 1 %
Lymphocytes Relative: 17 %
Lymphs Abs: 1 10*3/uL (ref 0.7–4.0)
MCH: 35.9 pg — ABNORMAL HIGH (ref 26.0–34.0)
MCHC: 36.3 g/dL — ABNORMAL HIGH (ref 30.0–36.0)
MCV: 98.8 fL (ref 80.0–100.0)
Monocytes Absolute: 0.6 10*3/uL (ref 0.1–1.0)
Monocytes Relative: 10 %
Neutro Abs: 4.2 10*3/uL (ref 1.7–7.7)
Neutrophils Relative %: 68 %
Platelet Count: 156 10*3/uL (ref 150–400)
RBC: 3.4 MIL/uL — ABNORMAL LOW (ref 4.22–5.81)
RDW: 17.7 % — ABNORMAL HIGH (ref 11.5–15.5)
WBC Count: 6.2 10*3/uL (ref 4.0–10.5)
nRBC: 0 % (ref 0.0–0.2)

## 2020-09-20 LAB — TSH: TSH: 14.374 u[IU]/mL — ABNORMAL HIGH (ref 0.320–4.118)

## 2020-09-20 LAB — CMP (CANCER CENTER ONLY)
ALT: 28 U/L (ref 0–44)
AST: 31 U/L (ref 15–41)
Albumin: 3.5 g/dL (ref 3.5–5.0)
Alkaline Phosphatase: 67 U/L (ref 38–126)
Anion gap: 13 (ref 5–15)
BUN: 13 mg/dL (ref 8–23)
CO2: 27 mmol/L (ref 22–32)
Calcium: 6.7 mg/dL — ABNORMAL LOW (ref 8.9–10.3)
Chloride: 98 mmol/L (ref 98–111)
Creatinine: 1.39 mg/dL — ABNORMAL HIGH (ref 0.61–1.24)
GFR, Estimated: 53 mL/min — ABNORMAL LOW (ref >60)
Glucose, Bld: 109 mg/dL — ABNORMAL HIGH (ref 70–99)
Potassium: 3.3 mmol/L — ABNORMAL LOW (ref 3.5–5.1)
Sodium: 138 mmol/L (ref 135–145)
Total Bilirubin: 1.2 mg/dL (ref 0.3–1.2)
Total Protein: 5.9 g/dL — ABNORMAL LOW (ref 6.5–8.1)

## 2020-09-20 MED ORDER — SODIUM CHLORIDE 0.9 % IV SOLN
480.0000 mg | Freq: Once | INTRAVENOUS | Status: AC
  Administered 2020-09-20: 480 mg via INTRAVENOUS
  Filled 2020-09-20: qty 48

## 2020-09-20 MED ORDER — SODIUM CHLORIDE 0.9 % IV SOLN
Freq: Once | INTRAVENOUS | Status: AC
Start: 2020-09-20 — End: 2020-09-20
  Filled 2020-09-20: qty 250

## 2020-09-20 NOTE — Patient Instructions (Signed)
Nivolumab injection What is this medicine? NIVOLUMAB (nye VOL ue mab) is a monoclonal antibody. It treats certain types of cancer. Some of the cancers treated are colon cancer, head and neck cancer, Hodgkin lymphoma, lung cancer, and melanoma. This medicine may be used for other purposes; ask your health care provider or pharmacist if you have questions. COMMON BRAND NAME(S): Opdivo What should I tell my health care provider before I take this medicine? They need to know if you have any of these conditions:  autoimmune diseases like Crohn's disease, ulcerative colitis, or lupus  have had or planning to have an allogeneic stem cell transplant (uses someone else's stem cells)  history of chest radiation  history of organ transplant  nervous system problems like myasthenia gravis or Guillain-Barre syndrome  an unusual or allergic reaction to nivolumab, other medicines, foods, dyes, or preservatives  pregnant or trying to get pregnant  breast-feeding How should I use this medicine? This medicine is for infusion into a vein. It is given by a health care professional in a hospital or clinic setting. A special MedGuide will be given to you before each treatment. Be sure to read this information carefully each time. Talk to your pediatrician regarding the use of this medicine in children. While this drug may be prescribed for children as young as 12 years for selected conditions, precautions do apply. Overdosage: If you think you have taken too much of this medicine contact a poison control center or emergency room at once. NOTE: This medicine is only for you. Do not share this medicine with others. What if I miss a dose? It is important not to miss your dose. Call your doctor or health care professional if you are unable to keep an appointment. What may interact with this medicine? Interactions have not been studied. This list may not describe all possible interactions. Give your health  care provider a list of all the medicines, herbs, non-prescription drugs, or dietary supplements you use. Also tell them if you smoke, drink alcohol, or use illegal drugs. Some items may interact with your medicine. What should I watch for while using this medicine? This drug may make you feel generally unwell. Continue your course of treatment even though you feel ill unless your doctor tells you to stop. You may need blood work done while you are taking this medicine. Do not become pregnant while taking this medicine or for 5 months after stopping it. Women should inform their doctor if they wish to become pregnant or think they might be pregnant. There is a potential for serious side effects to an unborn child. Talk to your health care professional or pharmacist for more information. Do not breast-feed an infant while taking this medicine or for 5 months after stopping it. What side effects may I notice from receiving this medicine? Side effects that you should report to your doctor or health care professional as soon as possible:  allergic reactions like skin rash, itching or hives, swelling of the face, lips, or tongue  breathing problems  blood in the urine  bloody or watery diarrhea or black, tarry stools  changes in emotions or moods  changes in vision  chest pain  cough  dizziness  feeling faint or lightheaded, falls  fever, chills  headache with fever, neck stiffness, confusion, loss of memory, sensitivity to light, hallucination, loss of contact with reality, or seizures  joint pain  mouth sores  redness, blistering, peeling or loosening of the skin, including inside the   mouth  severe muscle pain or weakness  signs and symptoms of high blood sugar such as dizziness; dry mouth; dry skin; fruity breath; nausea; stomach pain; increased hunger or thirst; increased urination  signs and symptoms of kidney injury like trouble passing urine or change in the amount of  urine  signs and symptoms of liver injury like dark yellow or brown urine; general ill feeling or flu-like symptoms; light-colored stools; loss of appetite; nausea; right upper belly pain; unusually weak or tired; yellowing of the eyes or skin  swelling of the ankles, feet, hands  trouble passing urine or change in the amount of urine  unusually weak or tired  weight gain or loss Side effects that usually do not require medical attention (report to your doctor or health care professional if they continue or are bothersome):  bone pain  constipation  decreased appetite  diarrhea  muscle pain  nausea, vomiting  tiredness This list may not describe all possible side effects. Call your doctor for medical advice about side effects. You may report side effects to FDA at 1-800-FDA-1088. Where should I keep my medicine? This drug is given in a hospital or clinic and will not be stored at home. NOTE: This sheet is a summary. It may not cover all possible information. If you have questions about this medicine, talk to your doctor, pharmacist, or health care provider.  2021 Elsevier/Gold Standard (2019-10-26 10:08:25)  

## 2020-10-02 ENCOUNTER — Other Ambulatory Visit (HOSPITAL_COMMUNITY): Payer: Self-pay

## 2020-10-03 ENCOUNTER — Other Ambulatory Visit: Payer: Self-pay | Admitting: Oncology

## 2020-10-05 ENCOUNTER — Other Ambulatory Visit: Payer: Self-pay

## 2020-10-05 ENCOUNTER — Telehealth: Payer: Self-pay

## 2020-10-05 MED ORDER — FUROSEMIDE 20 MG PO TABS: 20.0000 mg | ORAL_TABLET | Freq: Every day | ORAL | 0 refills | Status: DC

## 2020-10-05 NOTE — Telephone Encounter (Signed)
-----   Message from [name], MD sent at 10/05/2020 12:43 PM EDT ----- I do not think swelling is related to Opdivo and I will assess him before the next infusion.  If he is not ready to proceed on that day, we will delay.  For the time being, please call and prescription for Lasix 20 mg daily to take for the next 14 days to help with his swelling.  Thanks ----- Message ----- From: Tami Lin, RN Sent: 10/05/2020  12:25 PM EDT To: [name], MD  Patient's wife called with concerns. Patient had D1 C1 Opdivo on 3/17. Patient is scheduled for lab, MD, infusion on 10/15/20. Patient has lower extremity edema to the point where he can't put on shoes. Per wife she has been keeping feet elevated, bought support stockings, and has decreased the patient's sodium intake. Since the infusion patient has increased fatigue and decreased mobility. The wife wants to know if anything else can be done about the lower extremity swelling and she also has concerns about the 2nd treatment since it seems as if patient has not recovered from the first treatment.  Lanelle Bal

## 2020-10-05 NOTE — Telephone Encounter (Signed)
Patient's wife made aware of Dr. Hazeline Junker response below and verbalized understanding.

## 2020-10-06 ENCOUNTER — Other Ambulatory Visit (HOSPITAL_COMMUNITY): Payer: Self-pay

## 2020-10-07 ENCOUNTER — Other Ambulatory Visit (HOSPITAL_COMMUNITY): Payer: Self-pay

## 2020-10-07 MED FILL — Cabozantinib S-Malate Tab 40 MG (Base Equivalent): ORAL | 30 days supply | Qty: 30 | Fill #0 | Status: AC

## 2020-10-08 ENCOUNTER — Other Ambulatory Visit (HOSPITAL_COMMUNITY): Payer: Self-pay

## 2020-10-15 ENCOUNTER — Inpatient Hospital Stay: Payer: Medicare Other | Attending: Oncology

## 2020-10-15 ENCOUNTER — Inpatient Hospital Stay: Payer: Medicare Other

## 2020-10-15 ENCOUNTER — Telehealth: Payer: Self-pay

## 2020-10-15 ENCOUNTER — Other Ambulatory Visit: Payer: Self-pay | Admitting: Oncology

## 2020-10-15 ENCOUNTER — Other Ambulatory Visit: Payer: Self-pay

## 2020-10-15 ENCOUNTER — Inpatient Hospital Stay (HOSPITAL_BASED_OUTPATIENT_CLINIC_OR_DEPARTMENT_OTHER): Payer: Medicare Other | Admitting: Oncology

## 2020-10-15 VITALS — BP 132/91 | HR 94 | Temp 98.3°F | Resp 16 | Ht 69.0 in | Wt 195.8 lb

## 2020-10-15 DIAGNOSIS — Z923 Personal history of irradiation: Secondary | ICD-10-CM | POA: Insufficient documentation

## 2020-10-15 DIAGNOSIS — C7800 Secondary malignant neoplasm of unspecified lung: Secondary | ICD-10-CM | POA: Diagnosis not present

## 2020-10-15 DIAGNOSIS — C641 Malignant neoplasm of right kidney, except renal pelvis: Secondary | ICD-10-CM | POA: Insufficient documentation

## 2020-10-15 DIAGNOSIS — R946 Abnormal results of thyroid function studies: Secondary | ICD-10-CM | POA: Insufficient documentation

## 2020-10-15 DIAGNOSIS — C7951 Secondary malignant neoplasm of bone: Secondary | ICD-10-CM | POA: Insufficient documentation

## 2020-10-15 DIAGNOSIS — C649 Malignant neoplasm of unspecified kidney, except renal pelvis: Secondary | ICD-10-CM

## 2020-10-15 DIAGNOSIS — R410 Disorientation, unspecified: Secondary | ICD-10-CM | POA: Insufficient documentation

## 2020-10-15 DIAGNOSIS — E039 Hypothyroidism, unspecified: Secondary | ICD-10-CM

## 2020-10-15 LAB — CMP (CANCER CENTER ONLY)
ALT: 33 U/L (ref 0–44)
AST: 39 U/L (ref 15–41)
Albumin: 3 g/dL — ABNORMAL LOW (ref 3.5–5.0)
Alkaline Phosphatase: 81 U/L (ref 38–126)
Anion gap: 16 — ABNORMAL HIGH (ref 5–15)
BUN: 12 mg/dL (ref 8–23)
CO2: 24 mmol/L (ref 22–32)
Calcium: 6 mg/dL — CL (ref 8.9–10.3)
Chloride: 99 mmol/L (ref 98–111)
Creatinine: 1.36 mg/dL — ABNORMAL HIGH (ref 0.61–1.24)
GFR, Estimated: 55 mL/min — ABNORMAL LOW (ref >60)
Glucose, Bld: 118 mg/dL — ABNORMAL HIGH (ref 70–99)
Potassium: 3.2 mmol/L — ABNORMAL LOW (ref 3.5–5.1)
Sodium: 139 mmol/L (ref 135–145)
Total Bilirubin: 1 mg/dL (ref 0.3–1.2)
Total Protein: 6.5 g/dL (ref 6.5–8.1)

## 2020-10-15 LAB — CBC WITH DIFFERENTIAL (CANCER CENTER ONLY)
Abs Immature Granulocytes: 0.02 10*3/uL (ref 0.00–0.07)
Basophils Absolute: 0 10*3/uL (ref 0.0–0.1)
Basophils Relative: 1 %
Eosinophils Absolute: 0.3 10*3/uL (ref 0.0–0.5)
Eosinophils Relative: 6 %
HCT: 33.3 % — ABNORMAL LOW (ref 39.0–52.0)
Hemoglobin: 11.9 g/dL — ABNORMAL LOW (ref 13.0–17.0)
Immature Granulocytes: 0 %
Lymphocytes Relative: 15 %
Lymphs Abs: 0.8 10*3/uL (ref 0.7–4.0)
MCH: 37.4 pg — ABNORMAL HIGH (ref 26.0–34.0)
MCHC: 35.7 g/dL (ref 30.0–36.0)
MCV: 104.7 fL — ABNORMAL HIGH (ref 80.0–100.0)
Monocytes Absolute: 0.4 10*3/uL (ref 0.1–1.0)
Monocytes Relative: 7 %
Neutro Abs: 3.8 10*3/uL (ref 1.7–7.7)
Neutrophils Relative %: 71 %
Platelet Count: 156 10*3/uL (ref 150–400)
RBC: 3.18 MIL/uL — ABNORMAL LOW (ref 4.22–5.81)
RDW: 15.2 % (ref 11.5–15.5)
WBC Count: 5.4 10*3/uL (ref 4.0–10.5)
nRBC: 0 % (ref 0.0–0.2)

## 2020-10-15 LAB — TSH: TSH: 18.805 u[IU]/mL — ABNORMAL HIGH (ref 0.320–4.118)

## 2020-10-15 MED ORDER — LEVOTHYROXINE SODIUM 25 MCG PO TABS
25.0000 ug | ORAL_TABLET | Freq: Every day | ORAL | 3 refills | Status: DC
Start: 2020-10-15 — End: 2021-01-16

## 2020-10-15 MED ORDER — POTASSIUM CHLORIDE CRYS ER 20 MEQ PO TBCR: 20.0000 meq | EXTENDED_RELEASE_TABLET | Freq: Two times a day (BID) | ORAL | 3 refills | Status: DC

## 2020-10-15 NOTE — Telephone Encounter (Signed)
-----   Message from James Portela, MD sent at 10/15/2020 12:24 PM EDT ----- Please let him know his thyroid function is abnormal and will need replacement. Rx for synthroid will be sent to his pharmacy. We discussed this possibility earlier today.

## 2020-10-15 NOTE — Telephone Encounter (Signed)
Spoke to patient's wife, Hoyle Sauer, regarding patient's abnormal thyroid function and new prescription for synthroid. Patient's wife verbalized an understanding and confirmed pharmacy. She had no further questions or concerns at end of conversation.

## 2020-10-15 NOTE — Telephone Encounter (Signed)
CRITICAL VALUE STICKER  CRITICAL VALUE: Calcium: 6.0   RECEIVER (on-site recipient of call): Patty Sermons, RN  [place] NOTIFIED: 10/15/20 @ 10:27  MESSENGER (representative from lab): Ulice Dash  MD NOTIFIED: Alen Blew, MD  TIME OF NOTIFICATION: 10/15/20 @10 :29  RESPONSE: MD noted result. Currently meeting with patient in question.

## 2020-10-15 NOTE — Progress Notes (Signed)
Hematology and Oncology Follow Up Visit  James Massey [id number] 07/10/1945 74 y.o. 10/15/2020 10:12 AM James Massey, MDShadad, James Dad, MD   Principle Diagnosis: 67 year old man with stage IV clear-cell renal cell carcinoma with bone and the pulmonary involvement documented in July 2021.  Prior Therapy:  He is status post laparoscopic right radical nephrectomy on May 31, 2018.  He is status post right hip and left lung radiation therapy completed in August 2021.  He received 50 Gray in 5 fractions  He is status post radiation to the right posterior acetabular lesion completed in November 2021.  He received at 50 Gray in 5 fractions.  He status post radiation therapy to the right and the left proximal femur for a total of 30 Gray in 10 fractions completed in February 2022.  Current therapy: Cabometyx 40 mg daily started on 05/28/2020.  Nivolumab added on September 20, 2020.  Interim History: James Massey is here for a follow-up visit.  Since the last visit, he had completed the first infusion of nivolumab and has experienced multiple complaints.  He has reported symptoms of fatigue, tiredness lower extremity edema and occasional confusion.  He has reported fatigue and tiredness that has not been participating in physical therapy.  He continues to tolerate Cabometyx without any new complaints.  Denies any diarrhea or changes in his appetite.  He denies any specific neurological deficits such as weakness or neuropathy.  He did have an episode of garbled speech noted by his wife.  He feels well overall today.    Medications: Updated on review. Current Outpatient Medications  Medication Sig Dispense Refill  . allopurinol (ZYLOPRIM) 300 MG tablet Take 300 mg by mouth at bedtime.     Marland Kitchen amLODipine (NORVASC) 10 MG tablet Take 1 tablet (10 mg total) by mouth daily. 180 tablet 3  . atorvastatin (LIPITOR) 40 MG tablet Take 40 mg by mouth at bedtime.     . cabozantinib (CABOMETYX) 40 MG tablet  TAKE 1 TABLET (40 MG TOTAL) BY MOUTH DAILY. TAKE ON AN EMPTY STOMACH, 1 HOUR BEFORE OR 2 HOURS AFTER MEALS. 30 tablet 0  . colchicine 0.6 MG tablet Take 2 tabs once followed by 1 tab an hour later for gout flare (total 1.8 mg dose/course). Do not repeat for at least 3 days.    . diazepam (VALIUM) 5 MG tablet Take 2.5-5 mg by mouth every 12 (twelve) hours as needed (for eye twitching).     . furosemide (LASIX) 20 MG tablet Take 1 tablet (20 mg total) by mouth daily for 14 days. 14 tablet 0  . hydrocortisone 2.5 % cream Apply 1 application topically 2 (two) times daily as needed (for hemorroidal flare ups).    . magnesium 30 MG tablet Take 30 mg by mouth 2 (two) times daily.    . Multiple Vitamin (MULTIVITAMIN) tablet Take 1 tablet by mouth daily.    . pantoprazole (PROTONIX) 40 MG tablet Take 40 mg by mouth daily.    . pseudoephedrine-acetaminophen (TYLENOL SINUS) 30-500 MG TABS tablet Take 1-2 tablets by mouth every 4 (four) hours as needed (for sinus issues).    . Simethicone 180 MG CAPS Take 180 mg by mouth 3 (three) times daily as needed (for gas/indigestion.).    Marland Kitchen traMADol (ULTRAM) 50 MG tablet Take 1 tablet (50 mg total) by mouth every 6 (six) hours as needed. 60 tablet 0   No current facility-administered medications for this visit.     Allergies:  Allergies  Allergen Reactions  . Bee Venom Swelling  . Budesonide-Formoterol Fumarate Other (See Comments)    RESPIRATORY ISSUES  . Poison [employer] and ivy  . Clindamycin/Lincomycin Rash     Physical Exam:   Blood pressure (!) 132/91, pulse 94, temperature 98.3 F (36.8 C), temperature source Tympanic, resp. rate 16, height 5\' 9"  (1.753 m), weight 195 lb 12.8 oz (88.8 kg), SpO2 98 %.     ECOG: 1   General appearance: Comfortable appearing without any discomfort Head: Normocephalic without any trauma Oropharynx: Mucous membranes are moist and pink without any thrush or ulcers. Eyes: Pupils are equal and  round reactive to light. Lymph nodes: No cervical, supraclavicular, inguinal or axillary lymphadenopathy.   Heart:regular rate and rhythm.  S1 and S2.  Bilateral lower extremity edema noted. Lung: Clear without any rhonchi or wheezes.  No dullness to percussion. Abdomin: Soft, nontender, nondistended with good bowel sounds.  No hepatosplenomegaly. Musculoskeletal: No joint deformity or effusion.  Full range of motion noted. Neurological: No deficits noted on motor, sensory and deep tendon reflex exam. Skin: No petechial rash or dryness.  Appeared moist.           Lab Results: Lab Results  Component Value Date   WBC 5.4 10/15/2020   HGB 11.9 (L) 10/15/2020   HCT 33.3 (L) 10/15/2020   MCV 104.7 (H) 10/15/2020   PLT 156 10/15/2020     Chemistry      Component Value Date/Time   NA 138 09/20/2020 0834   K 3.3 (L) 09/20/2020 0834   CL 98 09/20/2020 0834   CO2 27 09/20/2020 0834   BUN 13 09/20/2020 0834   CREATININE 1.39 (H) 09/20/2020 0834      Component Value Date/Time   CALCIUM 6.7 (L) 09/20/2020 0834   ALKPHOS 67 09/20/2020 0834   AST 31 09/20/2020 0834   ALT 28 09/20/2020 0834   BILITOT 1.2 09/20/2020 0834       Impression and Plan:   75 year old man with:  1.   Stage IV clear-cell renal cell carcinoma with bone involvement and pulmonary disease diagnosed in 2021.    His disease status was updated at this time and treatment options were reviewed.  He is currently on Cabometyx and received the first infusion of nivolumab with few complications.  Risks and benefits of proceeding with treatment today were discussed and he opted to defer nivolumab treatment given the multitude of symptoms he has experienced.  Although I do not some of his symptoms are related to nivolumab we will continue to hold treatment until his issues resolve and potentially resume it in the next 4 to 5 weeks.  He is agreeable to continue with Cabometyx alone.    3.  Prognosis and goals of  care: Any treatment is palliative at this time although aggressive measures are warranted.  4.  Diarrhea prophylaxis: No diarrhea noted at this time overall.  He is tolerating Cabometyx without any worsening GI symptoms.  5.  Immune mediated complications: His TSH elevated which will be repeated today.  Potential immune complications such as pneumonitis hepatitis and other things were reviewed.  6.  Hypocalcemia: He is currently on calcium supplements which we will increase to a total of 1500 international unit daily.  7.  Lower extremity edema: Prescription for Lasix is available to him and will replace with potassium daily.  8.  Confusion and garbled speech: Unclear etiology could be metabolic in nature.  Will obtain imaging  studies of the brain to rule out specific metastasis.  9. Follow up: In 5weeks for repeat evaluation.   30  minutes were spent on this encounter.  Time was dedicated to reviewing laboratory data, disease status update and addressing complications related to his cancer and cancer therapy.  [name], MD 4/11/202210:12 AM

## 2020-10-23 ENCOUNTER — Telehealth: Payer: Self-pay | Admitting: *Deleted

## 2020-10-23 NOTE — Telephone Encounter (Signed)
PC to patient's wife, left message - per Dr. Alen Blew continue immodium for loose stools.  Regarding the recent episode of garbled speech, Dr. Alen Blew wants to wait and see results of MRI scheduled in May, no interventions at this time.  Informed patient's wife to call office with any further questions/concerns.

## 2020-10-23 NOTE — Telephone Encounter (Signed)
-----   Message from James Portela, MD sent at 10/23/2020 11:38 AM EDT ----- Regarding: RE: Patient concerns Continue with imodium when loose stools are noted. Will waite to see MRI results. Nothing else at this point.  ----- Message ----- From: Rolene Course, RN Sent: 10/23/2020  11:33 AM EDT To: James Portela, MD Subject: Patient concerns                               This patient's wife called & wanted to update you.  The patient has had increased stools for the last 3 days, wife stated it became diarrhea yesterday, he went four times during the night.  He hasn't had diarrhea this morning, she states he is drinking well, doesn't have fever or pain.  She has been giving him immodium which I told her was appropriate.  She also states he had a second episode of garbled speech last night (he has a brain MRI scheduled in May).  The patient became very upset after this episode and his wife gave him his diazepam. She also stated he was very "shaky."   Please advise, thanks.

## 2020-10-31 ENCOUNTER — Other Ambulatory Visit (HOSPITAL_COMMUNITY): Payer: Self-pay

## 2020-11-02 ENCOUNTER — Other Ambulatory Visit: Payer: Self-pay | Admitting: Oncology

## 2020-11-02 ENCOUNTER — Other Ambulatory Visit (HOSPITAL_COMMUNITY): Payer: Self-pay

## 2020-11-02 MED ORDER — CABOMETYX 40 MG PO TABS
40.0000 mg | ORAL_TABLET | Freq: Every day | ORAL | 0 refills | Status: DC
  Filled 2020-11-02: qty 30, 30d supply, fill #0

## 2020-11-05 ENCOUNTER — Other Ambulatory Visit: Payer: Self-pay

## 2020-11-05 ENCOUNTER — Ambulatory Visit (HOSPITAL_COMMUNITY)
Admission: RE | Admit: 2020-11-05 | Discharge: 2020-11-05 | Disposition: A | Discharge status: Home / Self Care | Payer: Medicare Other | Source: Ambulatory Visit | Attending: Oncology | Admitting: Oncology

## 2020-11-05 ENCOUNTER — Other Ambulatory Visit (HOSPITAL_COMMUNITY): Payer: Self-pay

## 2020-11-05 DIAGNOSIS — C649 Malignant neoplasm of unspecified kidney, except renal pelvis: Secondary | ICD-10-CM | POA: Diagnosis present

## 2020-11-05 IMAGING — MR MR HEAD WO/W CM
13 series · 48 of 48 positions shown · IV contrast (gadavist)
Comparison: None.

CLINICAL DATA: Stage IV renal cell carcinoma with confusion.

EXAM:
MRI HEAD WITHOUT AND WITH CONTRAST
TECHNIQUE: Multiplanar, multiecho pulse sequences of the brain and surrounding
structures were obtained without and with intravenous contrast.
CONTRAST:  7.5mL GADAVIST GADOBUTROL 1 MMOL/ML IV SOLN

[Series 5: DWI · axial · 3.0mm · 1.36mm/px · z∈[-46,+93]mm · 6 of 96 slices shown (1 of 2)]
[im 1/96]
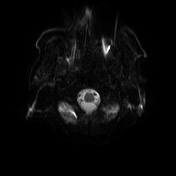
[im 20/96]
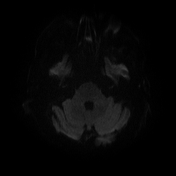
[im 39/96]
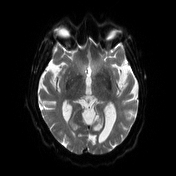
[im 58/96]
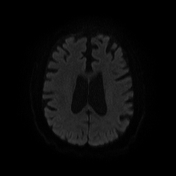
[im 77/96]
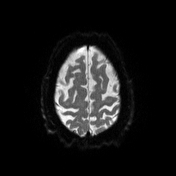
[im 96/96]
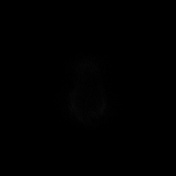

[Series 6: DWI · axial · 3.0mm · 1.36mm/px · z∈[-46,+93]mm · 3 of 48 slices shown (2 of 2)]
[im 1/48]
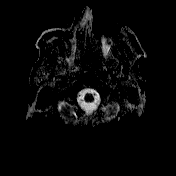
[im 24/48]
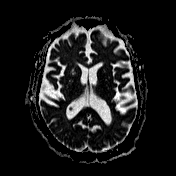
[im 48/48]
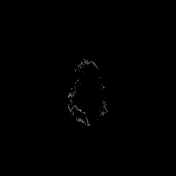

[Series 7: T1 · sagittal · 5.0mm · 0.75mm/px · 1 of 24 slices shown (1 of 2)]
[im 1/24]
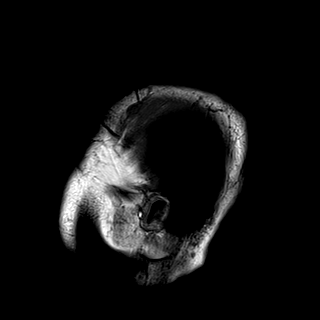

[Series 8: T2 · axial · 5.0mm · 0.62mm/px · z∈[-55,+106]mm · 2 of 26 slices shown]
[im 1/26]
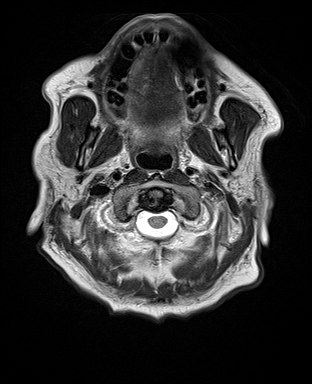
[im 26/26]
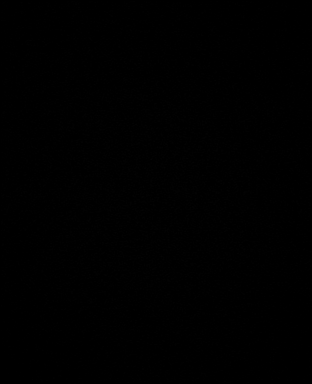

[Series 10: swi_images · axial · 3.0mm · 0.75mm/px · z∈[-50,+101]mm · 3 of 52 slices shown]
[im 1/52]
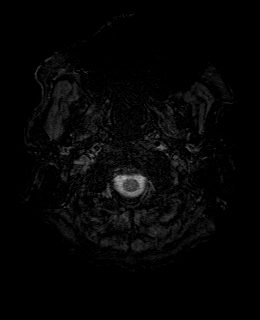
[im 26/52]
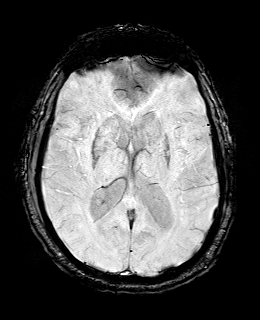
[im 52/52]
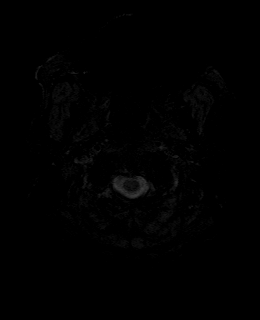

[Series 11: FLAIR · axial · 3.0mm · 0.75mm/px · z∈[-50,+101]mm · 3 of 52 slices shown]
[im 1/52]
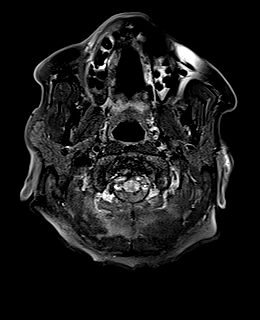
[im 26/52]
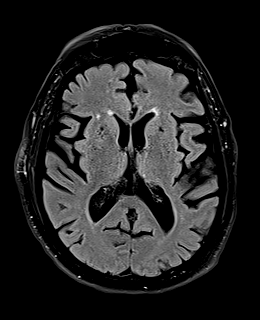
[im 52/52]
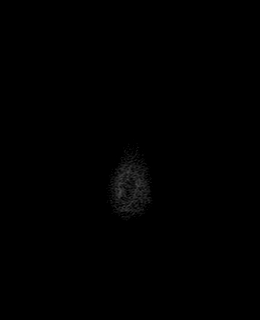

[Series 12: T1 · axial · 1.0mm · 0.94mm/px · z∈[-50,+107]mm · 10 of 160 slices shown (2 of 2)]
[im 1/160]
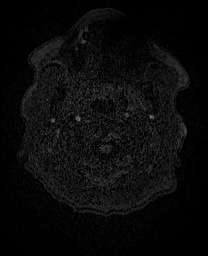
[im 18/160]
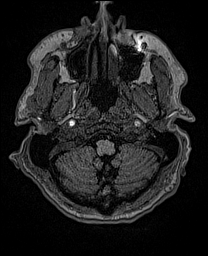
[im 36/160]
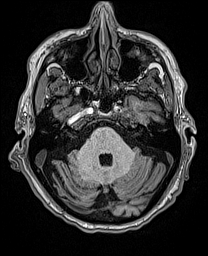
[im 54/160]
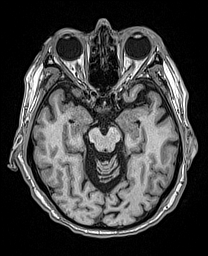
[im 71/160]
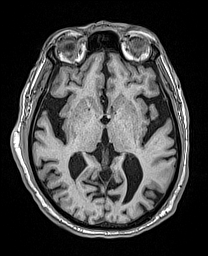
[im 89/160]
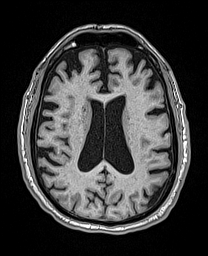
[im 107/160]
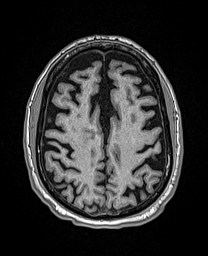
[im 124/160]
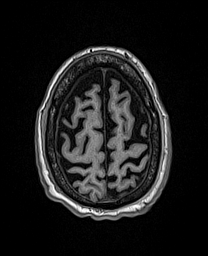
[im 142/160]
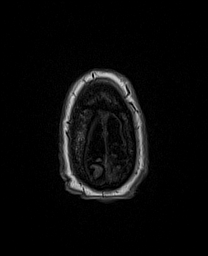
[im 160/160]
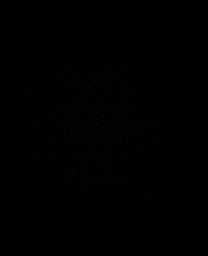

[Series 13: cor dwi_tracew · coronal · 5.0mm · 1.53mm/px · 3 of 56 slices shown]
[im 1/56]
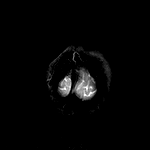
[im 28/56]
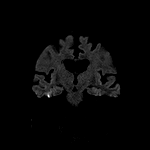
[im 56/56]
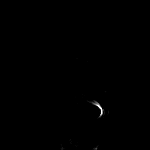

[Series 14: cor dwi_adc · coronal · 5.0mm · 1.53mm/px · 2 of 28 slices shown]
[im 1/28]
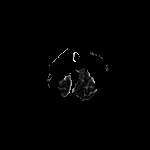
[im 28/28]
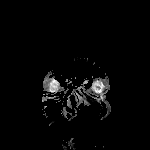

[Series 15: T2 post-contrast · coronal · 5.0mm · 0.57mm/px · 2 of 30 slices shown]
[im 1/30]
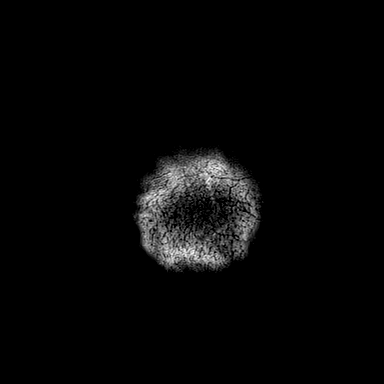
[im 30/30]
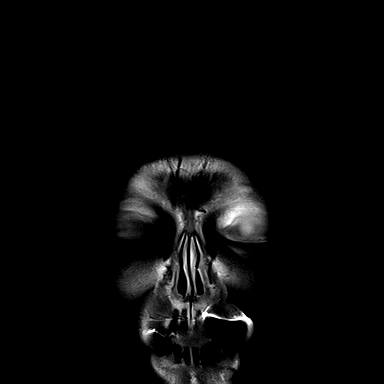

[Series 16: T1 post-contrast · axial · 1.0mm · 0.94mm/px · z∈[-50,+107]mm · 10 of 160 slices shown (1 of 3)]
[im 1/160]
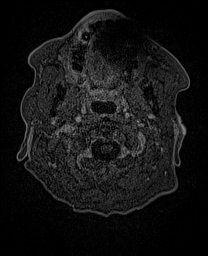
[im 18/160]
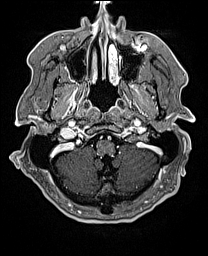
[im 36/160]
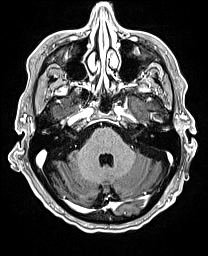
[im 54/160]
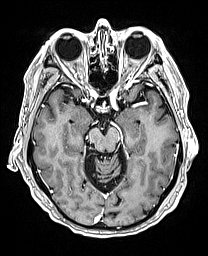
[im 71/160]
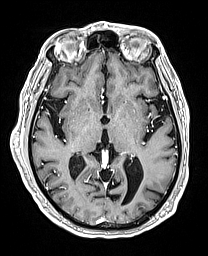
[im 89/160]
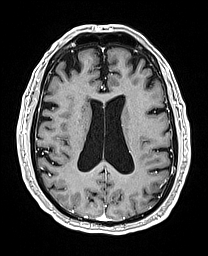
[im 107/160]
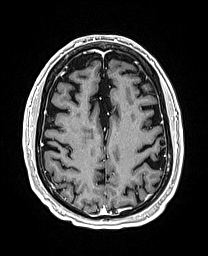
[im 124/160]
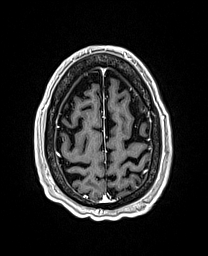
[im 142/160]
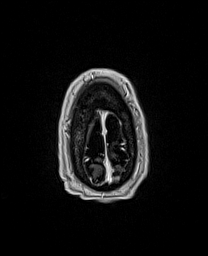
[im 160/160]
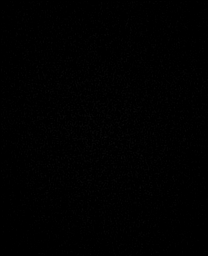

[Series 17: T1 post-contrast · coronal · 5.0mm · 0.43mm/px · 2 of 30 slices shown (2 of 3)]
[im 1/30]
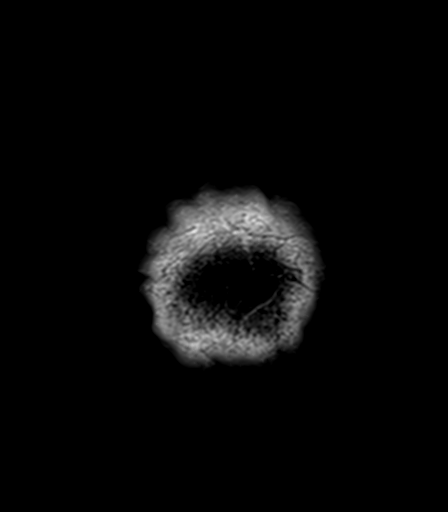
[im 30/30]
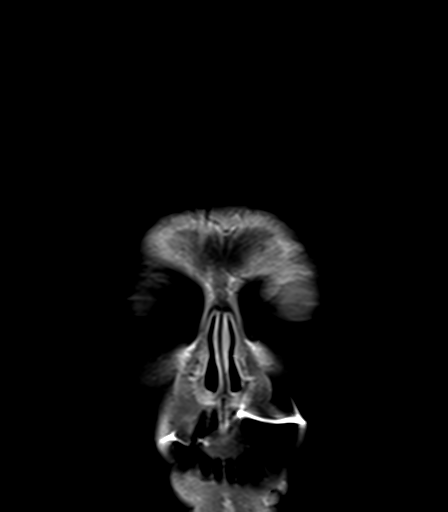

[Series 18: T1 post-contrast · sagittal · 5.0mm · 0.75mm/px · 1 of 24 slices shown (3 of 3)]
[im 1/24]
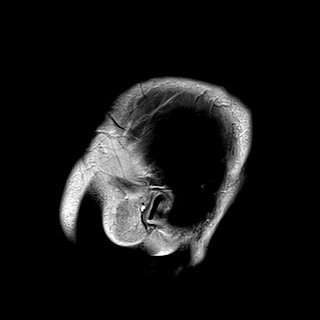

[48 of 48 positions shown; findings below may reference images not displayed]

FINDINGS: Brain: No acute infarction, hemorrhage, hydrocephalus, extra-axial
collection or mass lesion. Mild for age scattered T2/FLAIR
hyperintensities within the white matter, nonspecific but most
likely related to chronic microvascular ischemic disease given
patient age. Moderate cerebral atrophy with ex vacuo ventricular
dilation. No abnormal enhancement. Bilateral choroid plexus
xanthogranulomas, which restrict diffusion.

Vascular: Major arterial flow voids are maintained at the skull
base.

Skull and upper cervical spine: Approximately 7 mm focus of T2/FLAIR
hyperintensity within the left parietal calvarium does not have
solid enhancement and favored benign.

Sinuses/Orbits: Moderate mucosal thickening of bilateral maxillary
sinuses and ethmoid air cells.

Other: No mastoid effusions.
IMPRESSION: 1. No evidence of acute abnormality or intracranial metastatic
disease.
2. Mild for age chronic microvascular ischemic disease and moderate
atrophy.
3. Moderate maxillary and ethmoid paranasal sinus mucosal
thickening.

## 2020-11-05 MED ORDER — GADOBUTROL 1 MMOL/ML IV SOLN
7.5000 mL | Freq: Once | INTRAVENOUS | Status: AC | PRN
  Administered 2020-11-05: 7.5 mL via INTRAVENOUS

## 2020-11-06 ENCOUNTER — Telehealth: Payer: Self-pay | Admitting: *Deleted

## 2020-11-06 NOTE — Telephone Encounter (Signed)
Contacted patient regarding test results per Dr.[name]  - spoke with Ms. James Massey and gave her Dr. Hazeline Junker message. She verbalized understanding.

## 2020-11-06 NOTE — Telephone Encounter (Signed)
-----   Message from [name], MD sent at 11/06/2020  9:34 AM EDT ----- Please let him know his MRI shows no cancer or anything serious.

## 2020-11-19 ENCOUNTER — Other Ambulatory Visit: Payer: Self-pay

## 2020-11-19 ENCOUNTER — Inpatient Hospital Stay (HOSPITAL_BASED_OUTPATIENT_CLINIC_OR_DEPARTMENT_OTHER): Payer: Medicare Other | Admitting: Oncology

## 2020-11-19 ENCOUNTER — Inpatient Hospital Stay: Payer: Medicare Other | Attending: Oncology

## 2020-11-19 ENCOUNTER — Inpatient Hospital Stay: Payer: Medicare Other

## 2020-11-19 VITALS — BP 132/78 | HR 90 | Temp 98.0°F | Resp 17 | Ht 69.0 in | Wt 184.0 lb

## 2020-11-19 DIAGNOSIS — C7802 Secondary malignant neoplasm of left lung: Secondary | ICD-10-CM | POA: Insufficient documentation

## 2020-11-19 DIAGNOSIS — C649 Malignant neoplasm of unspecified kidney, except renal pelvis: Secondary | ICD-10-CM

## 2020-11-19 DIAGNOSIS — C641 Malignant neoplasm of right kidney, except renal pelvis: Secondary | ICD-10-CM | POA: Insufficient documentation

## 2020-11-19 DIAGNOSIS — E878 Other disorders of electrolyte and fluid balance, not elsewhere classified: Secondary | ICD-10-CM | POA: Insufficient documentation

## 2020-11-19 DIAGNOSIS — R918 Other nonspecific abnormal finding of lung field: Secondary | ICD-10-CM

## 2020-11-19 DIAGNOSIS — Z923 Personal history of irradiation: Secondary | ICD-10-CM | POA: Diagnosis not present

## 2020-11-19 DIAGNOSIS — Z79899 Other long term (current) drug therapy: Secondary | ICD-10-CM | POA: Diagnosis not present

## 2020-11-19 DIAGNOSIS — C7951 Secondary malignant neoplasm of bone: Secondary | ICD-10-CM | POA: Diagnosis present

## 2020-11-19 DIAGNOSIS — F320 Major depressive disorder, single episode, mild: Secondary | ICD-10-CM

## 2020-11-19 DIAGNOSIS — N2889 Other specified disorders of kidney and ureter: Secondary | ICD-10-CM

## 2020-11-19 DIAGNOSIS — E039 Hypothyroidism, unspecified: Secondary | ICD-10-CM

## 2020-11-19 LAB — CBC WITH DIFFERENTIAL (CANCER CENTER ONLY)
Abs Immature Granulocytes: 0.02 10*3/uL (ref 0.00–0.07)
Basophils Absolute: 0 10*3/uL (ref 0.0–0.1)
Basophils Relative: 1 %
Eosinophils Absolute: 0.2 10*3/uL (ref 0.0–0.5)
Eosinophils Relative: 4 %
HCT: 34.6 % — ABNORMAL LOW (ref 39.0–52.0)
Hemoglobin: 12.2 g/dL — ABNORMAL LOW (ref 13.0–17.0)
Immature Granulocytes: 0 %
Lymphocytes Relative: 15 %
Lymphs Abs: 0.8 10*3/uL (ref 0.7–4.0)
MCH: 38 pg — ABNORMAL HIGH (ref 26.0–34.0)
MCHC: 35.3 g/dL (ref 30.0–36.0)
MCV: 107.8 fL — ABNORMAL HIGH (ref 80.0–100.0)
Monocytes Absolute: 0.4 10*3/uL (ref 0.1–1.0)
Monocytes Relative: 8 %
Neutro Abs: 3.7 10*3/uL (ref 1.7–7.7)
Neutrophils Relative %: 72 %
Platelet Count: 183 10*3/uL (ref 150–400)
RBC: 3.21 MIL/uL — ABNORMAL LOW (ref 4.22–5.81)
RDW: 13.1 % (ref 11.5–15.5)
WBC Count: 5.1 10*3/uL (ref 4.0–10.5)
nRBC: 0 % (ref 0.0–0.2)

## 2020-11-19 LAB — CMP (CANCER CENTER ONLY)
ALT: 34 U/L (ref 0–44)
AST: 38 U/L (ref 15–41)
Albumin: 2.9 g/dL — ABNORMAL LOW (ref 3.5–5.0)
Alkaline Phosphatase: 75 U/L (ref 38–126)
Anion gap: 10 (ref 5–15)
BUN: 11 mg/dL (ref 8–23)
CO2: 27 mmol/L (ref 22–32)
Calcium: 7.5 mg/dL — ABNORMAL LOW (ref 8.9–10.3)
Chloride: 100 mmol/L (ref 98–111)
Creatinine: 1.06 mg/dL (ref 0.61–1.24)
GFR, Estimated: >60 mL/min (ref >60)
Glucose, Bld: 120 mg/dL — ABNORMAL HIGH (ref 70–99)
Potassium: 3.6 mmol/L (ref 3.5–5.1)
Sodium: 137 mmol/L (ref 135–145)
Total Bilirubin: 0.6 mg/dL (ref 0.3–1.2)
Total Protein: 6.4 g/dL — ABNORMAL LOW (ref 6.5–8.1)

## 2020-11-19 LAB — TSH: TSH: 12.281 u[IU]/mL — ABNORMAL HIGH (ref 0.320–4.118)

## 2020-11-19 MED ORDER — TRAMADOL HCL 50 MG PO TABS: 50.0000 mg | ORAL_TABLET | Freq: Four times a day (QID) | ORAL | 0 refills | Status: DC | PRN

## 2020-11-19 NOTE — Progress Notes (Signed)
Hematology and Oncology Follow Up Visit  James Massey [id number] 08-13-1945 75 y.o. 11/19/2020 8:37 AM Alen Blew, [name], MDShadad, [name], MD   Principle Diagnosis: 5 year old man with kidney cancer diagnosed with localized disease in 2019.  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  Prior Therapy:  He is status post laparoscopic right radical nephrectomy on May 31, 2018.  He is status post right hip and left lung radiation therapy completed in August 2021.  He received 50 Gray in 5 fractions  He is status post radiation to the right posterior acetabular lesion completed in November 2021.  He received at 50 Gray in 5 fractions.  He status post radiation therapy to the right and the left proximal femur for a total of 30 Gray in 10 fractions completed in February 2022.  Current therapy: Cabometyx 40 mg daily started on 05/28/2020.  Nivolumab added on September 20, 2020.  He received 1 cycle of treatment and therapy have been on hold.  Interim History: James Massey returns today for repeat evaluation.  Since her last visit, he reports no major changes in his health.  He denies any shortness of breath or difficulty breathing.  He continues to tolerate Cabometyx without any complications.  Denies any nausea, vomiting or worsening diarrhea.  His mobility is still limited continues to participate with physical therapy.  He denies any worsening bone pain pathological fractures.  He continues to use tramadol although very sparingly at this time.  He is averaging less than 1 tablet/day.    Medications: Unchanged on review. Current Outpatient Medications  Medication Sig Dispense Refill  . allopurinol (ZYLOPRIM) 300 MG tablet Take 300 mg by mouth at bedtime.     Marland Kitchen amLODipine (NORVASC) 10 MG tablet Take 1 tablet (10 mg total) by mouth daily. 180 tablet 3  . atorvastatin (LIPITOR) 40 MG tablet Take 40 mg by mouth at bedtime.     .  cabozantinib (CABOMETYX) 40 MG tablet TAKE 1 TABLET (40 MG TOTAL) BY MOUTH DAILY. TAKE ON AN EMPTY STOMACH, 1 HOUR BEFORE OR 2 HOURS AFTER MEALS. 30 tablet 0  . colchicine 0.6 MG tablet Take 2 tabs once followed by 1 tab an hour later for gout flare (total 1.8 mg dose/course). Do not repeat for at least 3 days.    . diazepam (VALIUM) 5 MG tablet Take 2.5-5 mg by mouth every 12 (twelve) hours as needed (for eye twitching).     . furosemide (LASIX) 20 MG tablet Take 1 tablet (20 mg total) by mouth daily for 14 days. 14 tablet 0  . hydrocortisone 2.5 % cream Apply 1 application topically 2 (two) times daily as needed (for hemorroidal flare ups).    Marland Kitchen levothyroxine (SYNTHROID) 25 MCG tablet Take 1 tablet (25 mcg total) by mouth daily before breakfast. 60 tablet 3  . magnesium 30 MG tablet Take 30 mg by mouth 2 (two) times daily.    . Multiple Vitamin (MULTIVITAMIN) tablet Take 1 tablet by mouth daily.    . pantoprazole (PROTONIX) 40 MG tablet Take 40 mg by mouth daily.    . potassium chloride SA (KLOR-CON) 20 MEQ tablet Take 1 tablet (20 mEq total) by mouth 2 (two) times daily. 60 tablet 3  . pseudoephedrine-acetaminophen (TYLENOL SINUS) 30-500 MG TABS tablet Take 1-2 tablets by mouth every 4 (four) hours as needed (for sinus issues).    . Simethicone 180 MG CAPS Take 180 mg by mouth  3 (three) times daily as needed (for gas/indigestion.).    Marland Kitchen traMADol (ULTRAM) 50 MG tablet Take 1 tablet (50 mg total) by mouth every 6 (six) hours as needed. 60 tablet 0   No current facility-administered medications for this visit.     Allergies:  Allergies  Allergen Reactions  . Bee Venom Swelling  . Budesonide-Formoterol Fumarate Other (See Comments)    RESPIRATORY ISSUES  . Poison [employer] and ivy  . Clindamycin/Lincomycin Rash     Physical Exam:   Blood pressure 132/78, pulse 90, temperature 98 F (36.7 C), temperature source Tympanic, resp. rate 17, height 5\' 9"  (1.753 m), weight  184 lb (83.5 kg), SpO2 98 %.      ECOG: 1     General appearance: Alert, awake without any distress. Head: Atraumatic without abnormalities Oropharynx: Without any thrush or ulcers. Eyes: No scleral icterus. Lymph nodes: No lymphadenopathy noted in the cervical, supraclavicular, or axillary nodes Heart:regular rate and rhythm, without any murmurs or gallops.   Lung: Clear to auscultation without any rhonchi, wheezes or dullness to percussion. Abdomin: Soft, nontender without any shifting dullness or ascites. Musculoskeletal: No clubbing or cyanosis. Neurological: No motor or sensory deficits. Skin: No rashes or lesions.            Lab Results: Lab Results  Component Value Date   WBC 5.4 10/15/2020   HGB 11.9 (L) 10/15/2020   HCT 33.3 (L) 10/15/2020   MCV 104.7 (H) 10/15/2020   PLT 156 10/15/2020     Chemistry      Component Value Date/Time   NA 139 10/15/2020 0940   K 3.2 (L) 10/15/2020 0940   CL 99 10/15/2020 0940   CO2 24 10/15/2020 0940   BUN 12 10/15/2020 0940   CREATININE 1.36 (H) 10/15/2020 0940      Component Value Date/Time   CALCIUM 6.0 (LL) 10/15/2020 0940   ALKPHOS 81 10/15/2020 0940   AST 39 10/15/2020 0940   ALT 33 10/15/2020 0940   BILITOT 1.0 10/15/2020 0940     Other: No mastoid effusions.  IMPRESSION: 1. No evidence of acute abnormality or intracranial metastatic disease. 2. Mild for age chronic microvascular ischemic disease and moderate atrophy. 3. Moderate maxillary and ethmoid paranasal sinus mucosal thickening.  Impression and Plan:   75 year old man with:  1.  Kidney cancer diagnosed in 2019.  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   He continues to be on Cabometyx alone and did not tolerate combination with Imuran therapy.  Risks and benefits of continuing this treatment versus alternative treatment options were discussed.  These  alternative options would be combination immunotherapy, switching to a different oral targeted agent such as Inlyta.  At this time I recommended updating his staging scans.  Reevaluate in 4 weeks.  He is agreeable with this plan.    3.  Prognosis and goals of care: Therapy remains palliative although aggressive measures are warranted at this time.  4.  GI complications: I continue to educate him about potential GI toxicity associated with Cabometyx.  Diarrhea prophylaxis as well as alteration of taste and anorexia were reviewed.   5.  Immune mediated complications: His TSH was elevated prior to the start of immunotherapy and will be repeated today.  Thyroid replacement may be needed.  He has not experienced any other complications including pneumonitis, colitis and thyroid disease.  6.  Hypocalcemia: His calcium will  be repeated today.  He is currently on calcium replacement therapy.  7.  Lower extremity edema: Improved with Lasix.  8.  Confusion and garbled speech: MRI of the brain obtained on Nov 05, 2020 showed no evidence of intracranial abnormalities.  9.  Electrolyte imbalance: His potassium and calcium continues to improve with supplements.  10. Follow up: He will return next months for a follow-up visit.   30  minutes were spent on this visit.  The time was dedicated to reviewing disease status, discussing treatment options and future plan of care review.  [name], MD 5/16/20228:37 AM

## 2020-11-28 ENCOUNTER — Other Ambulatory Visit (HOSPITAL_COMMUNITY): Payer: Self-pay

## 2020-12-10 ENCOUNTER — Ambulatory Visit (INDEPENDENT_AMBULATORY_CARE_PROVIDER_SITE_OTHER): Payer: Medicare Other | Admitting: Psychologist

## 2020-12-10 DIAGNOSIS — F411 Generalized anxiety disorder: Secondary | ICD-10-CM | POA: Diagnosis not present

## 2020-12-13 ENCOUNTER — Ambulatory Visit (HOSPITAL_COMMUNITY)
Admission: RE | Admit: 2020-12-13 | Discharge: 2020-12-13 | Disposition: A | Discharge status: Home / Self Care | Payer: Medicare Other | Source: Ambulatory Visit | Attending: Oncology | Admitting: Oncology

## 2020-12-13 ENCOUNTER — Other Ambulatory Visit: Payer: Self-pay

## 2020-12-13 DIAGNOSIS — R918 Other nonspecific abnormal finding of lung field: Secondary | ICD-10-CM | POA: Diagnosis present

## 2020-12-13 DIAGNOSIS — C649 Malignant neoplasm of unspecified kidney, except renal pelvis: Secondary | ICD-10-CM | POA: Insufficient documentation

## 2020-12-13 DIAGNOSIS — N2889 Other specified disorders of kidney and ureter: Secondary | ICD-10-CM

## 2020-12-13 IMAGING — CT CT CHEST W/O CM
2 of 4 series · 13 of 36 positions shown, 16 images · non-contrast
Comparison: [DATE].

CLINICAL DATA: Urologic cancer, assess treatment response in a
75-year-old male. History of metastatic renal cell carcinoma.

EXAM:
CT CHEST, ABDOMEN AND PELVIS WITHOUT CONTRAST
TECHNIQUE: Multidetector CT imaging of the chest, abdomen and pelvis was
performed following the standard protocol without IV contrast.

[Series 2: cap w/o · axial · non-contrast · 0.85mm/px · z∈[-594,-59]mm · 10 of 131 slices shown, 13 images]
[im 12/131  mediastinal]
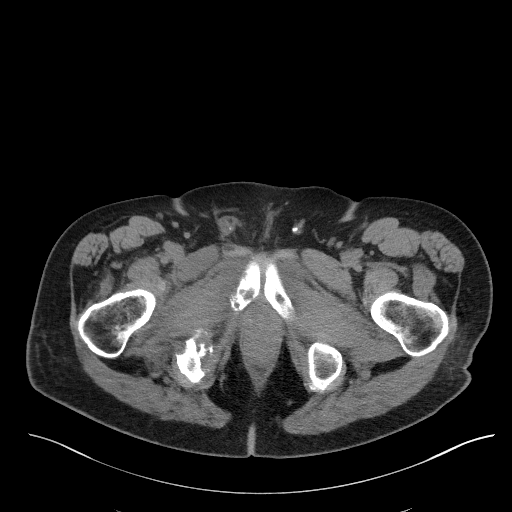
[im 12/131  lung]
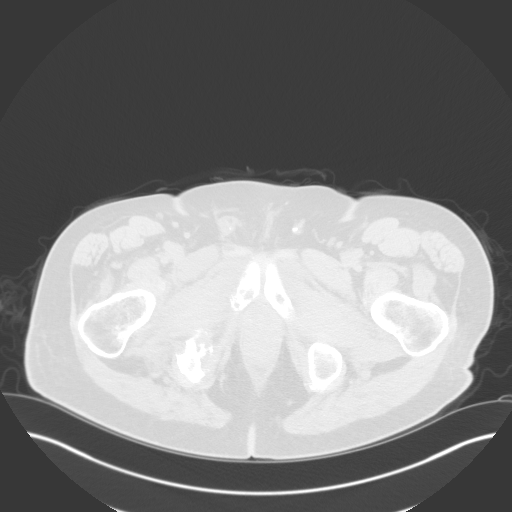
[im 24/131  lung]
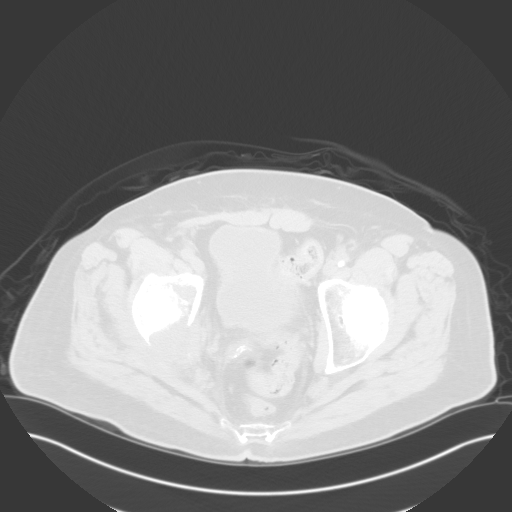
[im 36/131  lung]
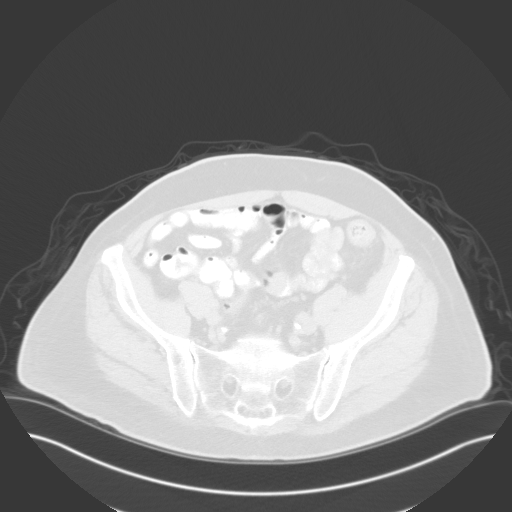
[im 48/131  lung]
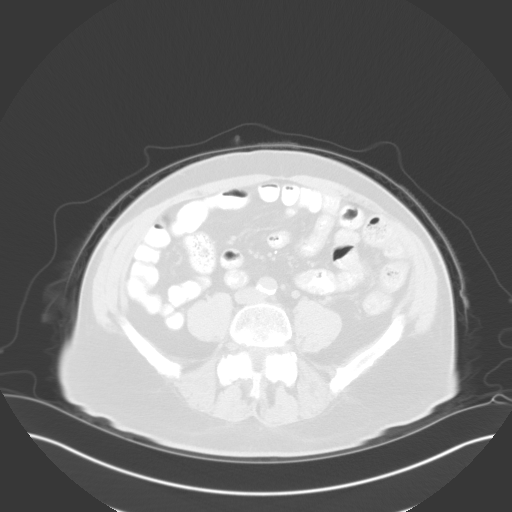
[im 60/131  mediastinal]
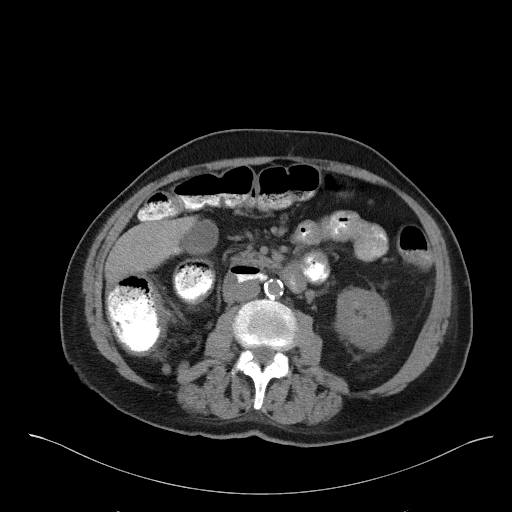
[im 60/131  lung]
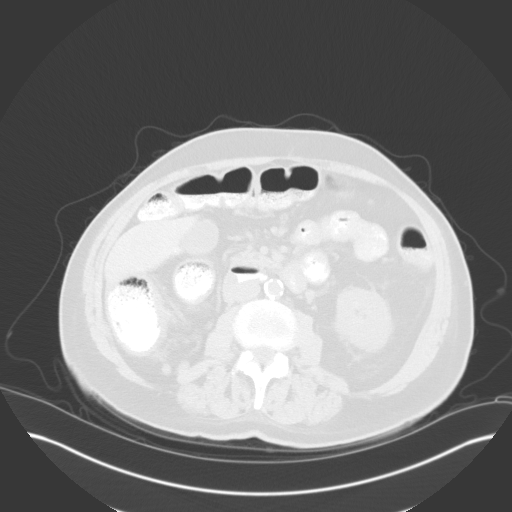
[im 71/131  lung]
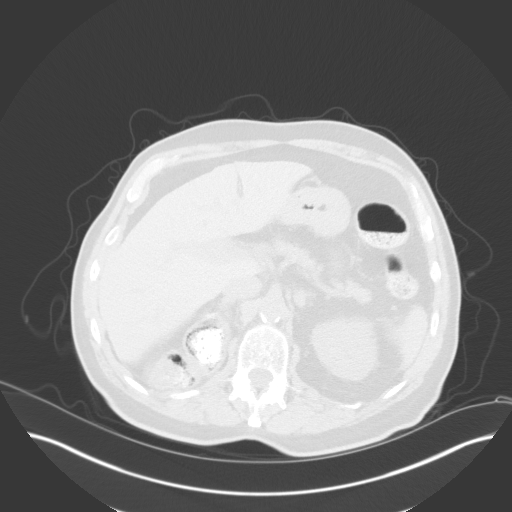
[im 83/131  lung]
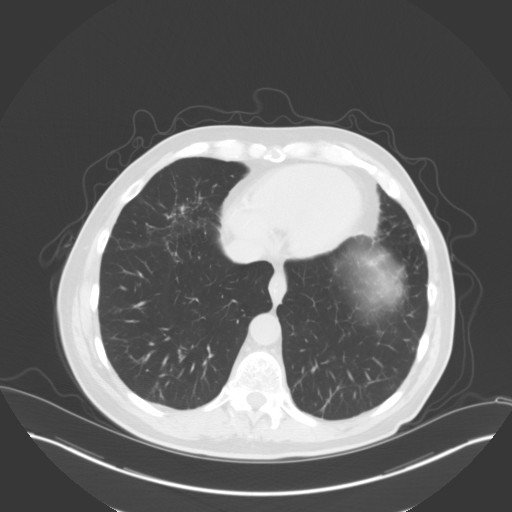
[im 95/131  lung]
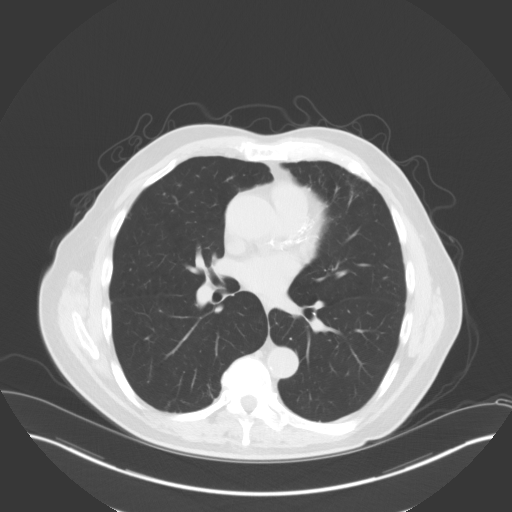
[im 107/131  mediastinal]
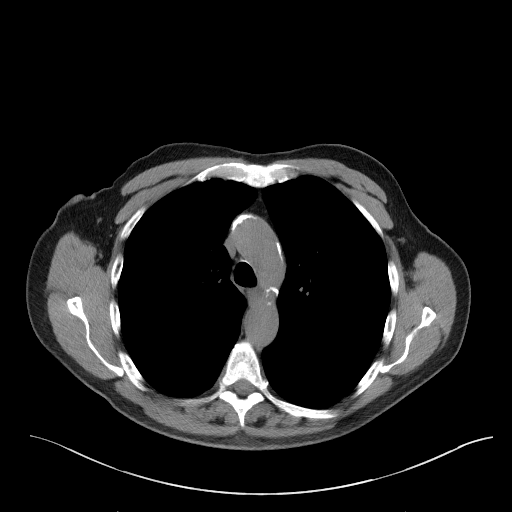
[im 107/131  lung]
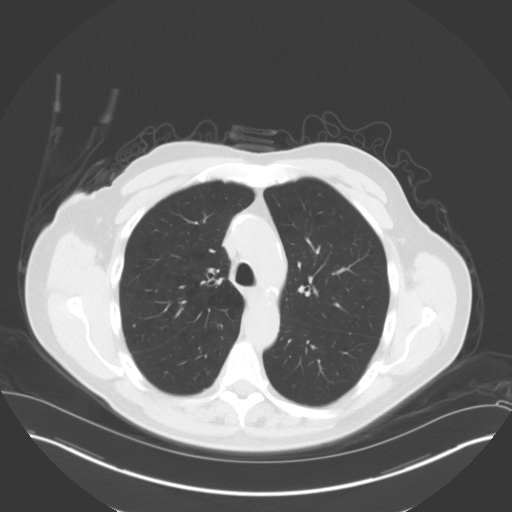
[im 119/131  lung]
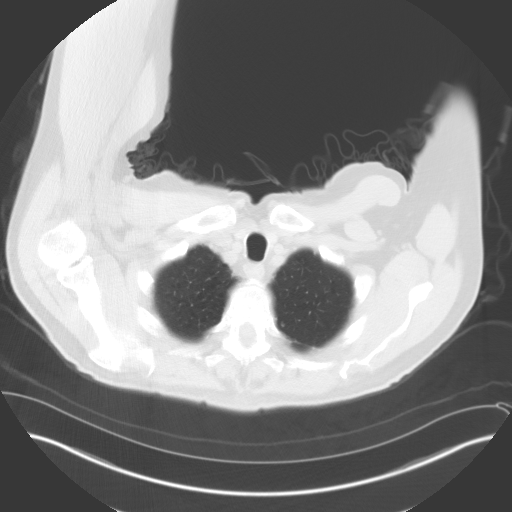

[Series 4: coronals · coronal · 0.81mm/px · 3 of 149 slices shown]
[im 30/149  lung]
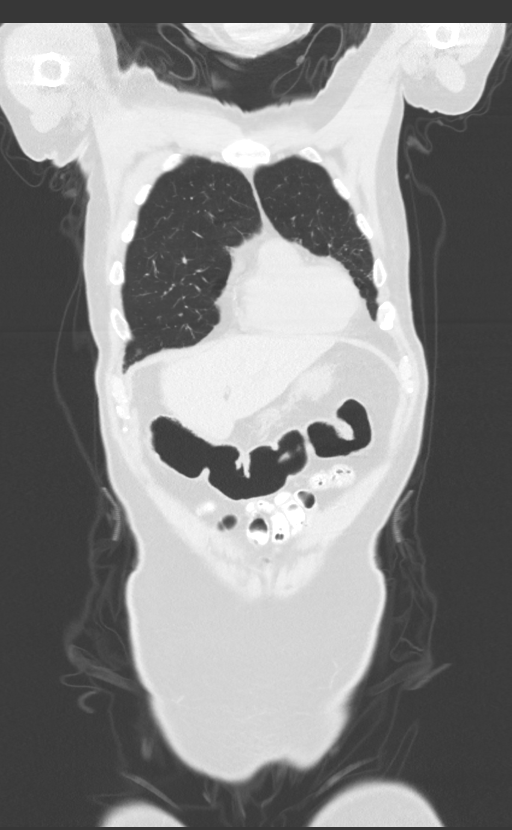
[im 60/149  lung]
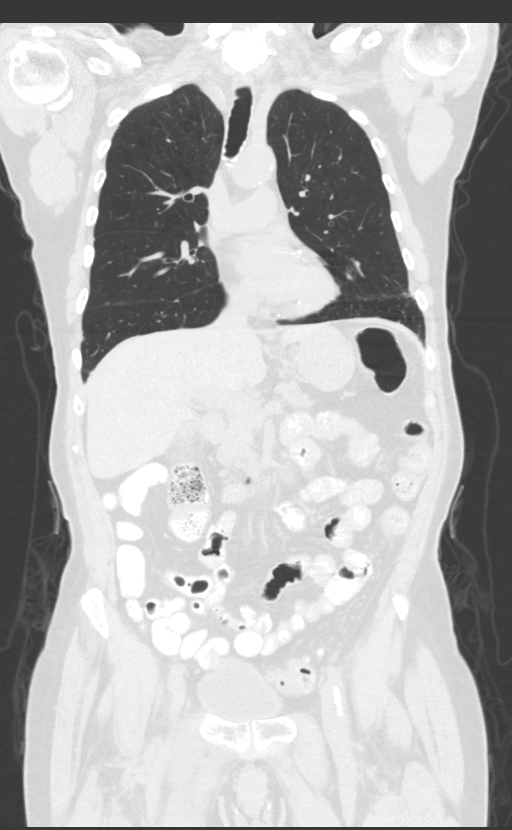
[im 89/149  lung]
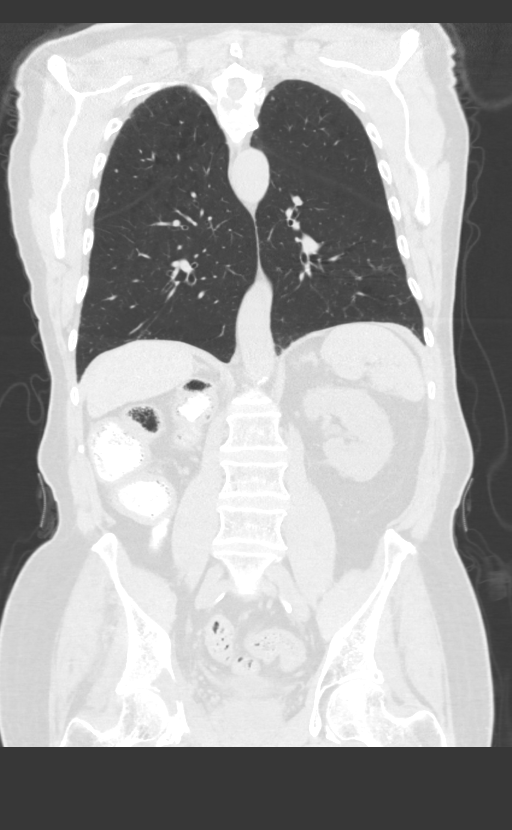

[13 of 36 positions shown; findings below may reference images not displayed]

FINDINGS: CT CHEST FINDINGS

Cardiovascular: Calcified atheromatous plaque in the thoracic aorta.
Three-vessel coronary artery disease. Normal heart size without
pericardial effusion. Central pulmonary vasculatures normal caliber.
Limited assessment of cardiovascular structures given lack of
intravenous contrast.

Mediastinum/Nodes: Thoracic inlet structures are normal. No axillary
lymphadenopathy. No mediastinal lymphadenopathy. No hilar
lymphadenopathy.

Lungs/Pleura: Signs of pulmonary emphysema. Small pulmonary nodule
in the anterior RIGHT upper lobe 3 mm, stable compared to the
previous study.

Tree-in-bud opacities in the RIGHT middle lobe also with similar
appearance. New area of mild consolidative change at the LEFT lung
base compared to the prior study. Airways are patent. Tree-in-bud
nodularity at the RIGHT lung base and small nodules without change.

Musculoskeletal: See below for full musculoskeletal details.

CT ABDOMEN PELVIS FINDINGS

Hepatobiliary: Sludge in the gallbladder. Smooth contours of the
liver.

Pancreas: Normal contour without inflammation.

Spleen: Spleen normal size and contour.

Adrenals/Urinary Tract: Adrenal glands are normal.

Post RIGHT nephrectomy. Signs of mild LEFT perinephric stranding
that are similar to the prior study. No hydronephrosis. No ureteral
calculus. Smooth contour of the urinary bladder. No nodularity in
the nephrectomy bed.

Stomach/Bowel: Stomach under distended limiting assessment. Small
bowel is normal caliber. No adjacent inflammation.

Scattered colonic diverticulosis.

Vascular/Lymphatic: Calcified atheromatous plaque of the abdominal
aorta without aneurysmal dilation. There is no gastrohepatic or
hepatoduodenal ligament lymphadenopathy. No retroperitoneal or
mesenteric lymphadenopathy.

No pelvic sidewall lymphadenopathy.

Reproductive: Prostate unremarkable by CT.

Other: No ascites.  No free air.

Musculoskeletal: Lytic changes at T3 measure 1.5 cm (image [DATE])
unchanged.

Marked lytic and destructive soft tissue associated metastasis along
the RIGHT acetabulum measuring approximately 4.1 x 3.7 cm with
similar appearance to prior imaging.

Destructive changes about the RIGHT inferior pubic ramus also with
similar appearance.

Destructive lesion in the sternum is unchanged.
IMPRESSION: 1. New area of mild consolidative change at the LEFT lung base,
likely post infectious or inflammatory. Please correlate with any
recent history of infection, attention on follow-up.
2. Scattered pulmonary small nodules unchanged.
3. Post RIGHT nephrectomy.  No new or progressive findings.
4. Destructive bone lesions without change.
5. Three-vessel coronary artery disease.
6. Emphysema and aortic atherosclerosis.

Aortic Atherosclerosis ([Y8]-[Y8]) and Emphysema ([Y8]-[Y8]).

## 2020-12-13 IMAGING — CT CT ABD-PELV W/O CM
1 series · 1 of 1 positions shown · non-contrast
Comparison: [DATE].

CLINICAL DATA: Urologic cancer, assess treatment response in a
75-year-old male. History of metastatic renal cell carcinoma.

EXAM:
CT CHEST, ABDOMEN AND PELVIS WITHOUT CONTRAST
TECHNIQUE: Multidetector CT imaging of the chest, abdomen and pelvis was
performed following the standard protocol without IV contrast.

[Series 1: topogram 1.0 tr20 · coronal · 2.00mm/px · 1 of 1 slices shown]
[im 1/1]
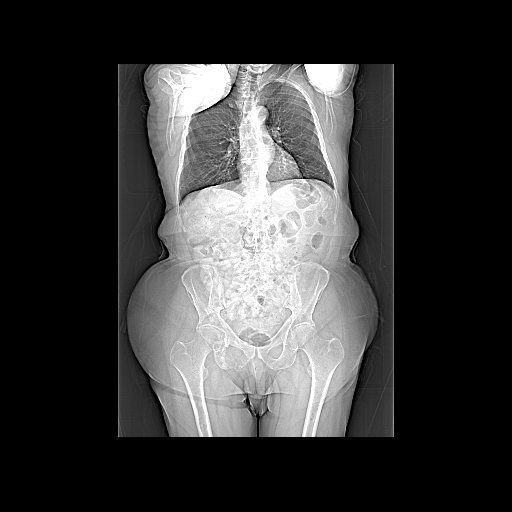

[1 of 1 positions shown; findings below may reference images not displayed]

FINDINGS: CT CHEST FINDINGS

Cardiovascular: Calcified atheromatous plaque in the thoracic aorta.
Three-vessel coronary artery disease. Normal heart size without
pericardial effusion. Central pulmonary vasculatures normal caliber.
Limited assessment of cardiovascular structures given lack of
intravenous contrast.

Mediastinum/Nodes: Thoracic inlet structures are normal. No axillary
lymphadenopathy. No mediastinal lymphadenopathy. No hilar
lymphadenopathy.

Lungs/Pleura: Signs of pulmonary emphysema. Small pulmonary nodule
in the anterior RIGHT upper lobe 3 mm, stable compared to the
previous study.

Tree-in-bud opacities in the RIGHT middle lobe also with similar
appearance. New area of mild consolidative change at the LEFT lung
base compared to the prior study. Airways are patent. Tree-in-bud
nodularity at the RIGHT lung base and small nodules without change.

Musculoskeletal: See below for full musculoskeletal details.

CT ABDOMEN PELVIS FINDINGS

Hepatobiliary: Sludge in the gallbladder. Smooth contours of the
liver.

Pancreas: Normal contour without inflammation.

Spleen: Spleen normal size and contour.

Adrenals/Urinary Tract: Adrenal glands are normal.

Post RIGHT nephrectomy. Signs of mild LEFT perinephric stranding
that are similar to the prior study. No hydronephrosis. No ureteral
calculus. Smooth contour of the urinary bladder. No nodularity in
the nephrectomy bed.

Stomach/Bowel: Stomach under distended limiting assessment. Small
bowel is normal caliber. No adjacent inflammation.

Scattered colonic diverticulosis.

Vascular/Lymphatic: Calcified atheromatous plaque of the abdominal
aorta without aneurysmal dilation. There is no gastrohepatic or
hepatoduodenal ligament lymphadenopathy. No retroperitoneal or
mesenteric lymphadenopathy.

No pelvic sidewall lymphadenopathy.

Reproductive: Prostate unremarkable by CT.

Other: No ascites.  No free air.

Musculoskeletal: Lytic changes at T3 measure 1.5 cm (image [DATE])
unchanged.

Marked lytic and destructive soft tissue associated metastasis along
the RIGHT acetabulum measuring approximately 4.1 x 3.7 cm with
similar appearance to prior imaging.

Destructive changes about the RIGHT inferior pubic ramus also with
similar appearance.

Destructive lesion in the sternum is unchanged.
IMPRESSION: 1. New area of mild consolidative change at the LEFT lung base,
likely post infectious or inflammatory. Please correlate with any
recent history of infection, attention on follow-up.
2. Scattered pulmonary small nodules unchanged.
3. Post RIGHT nephrectomy.  No new or progressive findings.
4. Destructive bone lesions without change.
5. Three-vessel coronary artery disease.
6. Emphysema and aortic atherosclerosis.

Aortic Atherosclerosis ([Y8]-[Y8]) and Emphysema ([Y8]-[Y8]).

## 2020-12-17 ENCOUNTER — Other Ambulatory Visit: Payer: Self-pay

## 2020-12-17 ENCOUNTER — Inpatient Hospital Stay (HOSPITAL_BASED_OUTPATIENT_CLINIC_OR_DEPARTMENT_OTHER): Payer: Medicare Other | Admitting: Oncology

## 2020-12-17 ENCOUNTER — Inpatient Hospital Stay: Payer: Medicare Other | Attending: Oncology

## 2020-12-17 VITALS — BP 138/92 | HR 103 | Temp 97.5°F | Resp 20 | Ht 69.0 in | Wt 179.6 lb

## 2020-12-17 DIAGNOSIS — I700 Atherosclerosis of aorta: Secondary | ICD-10-CM | POA: Diagnosis not present

## 2020-12-17 DIAGNOSIS — Z923 Personal history of irradiation: Secondary | ICD-10-CM | POA: Insufficient documentation

## 2020-12-17 DIAGNOSIS — C7800 Secondary malignant neoplasm of unspecified lung: Secondary | ICD-10-CM | POA: Insufficient documentation

## 2020-12-17 DIAGNOSIS — Z881 Allergy status to other antibiotic agents status: Secondary | ICD-10-CM | POA: Insufficient documentation

## 2020-12-17 DIAGNOSIS — C7951 Secondary malignant neoplasm of bone: Secondary | ICD-10-CM | POA: Diagnosis present

## 2020-12-17 DIAGNOSIS — E236 Other disorders of pituitary gland: Secondary | ICD-10-CM | POA: Diagnosis not present

## 2020-12-17 DIAGNOSIS — Z91030 Bee allergy status: Secondary | ICD-10-CM | POA: Insufficient documentation

## 2020-12-17 DIAGNOSIS — Z79899 Other long term (current) drug therapy: Secondary | ICD-10-CM | POA: Insufficient documentation

## 2020-12-17 DIAGNOSIS — C649 Malignant neoplasm of unspecified kidney, except renal pelvis: Secondary | ICD-10-CM

## 2020-12-17 DIAGNOSIS — I2510 Atherosclerotic heart disease of native coronary artery without angina pectoris: Secondary | ICD-10-CM | POA: Insufficient documentation

## 2020-12-17 DIAGNOSIS — C641 Malignant neoplasm of right kidney, except renal pelvis: Secondary | ICD-10-CM

## 2020-12-17 DIAGNOSIS — J439 Emphysema, unspecified: Secondary | ICD-10-CM | POA: Diagnosis not present

## 2020-12-17 DIAGNOSIS — Z905 Acquired absence of kidney: Secondary | ICD-10-CM | POA: Diagnosis not present

## 2020-12-17 DIAGNOSIS — E039 Hypothyroidism, unspecified: Secondary | ICD-10-CM

## 2020-12-17 LAB — CMP (CANCER CENTER ONLY)
ALT: 34 U/L (ref 0–44)
AST: 39 U/L (ref 15–41)
Albumin: 3 g/dL — ABNORMAL LOW (ref 3.5–5.0)
Alkaline Phosphatase: 80 U/L (ref 38–126)
Anion gap: 11 (ref 5–15)
BUN: 11 mg/dL (ref 8–23)
CO2: 25 mmol/L (ref 22–32)
Calcium: 7.4 mg/dL — ABNORMAL LOW (ref 8.9–10.3)
Chloride: 101 mmol/L (ref 98–111)
Creatinine: 1.05 mg/dL (ref 0.61–1.24)
GFR, Estimated: >60 mL/min (ref >60)
Glucose, Bld: 131 mg/dL — ABNORMAL HIGH (ref 70–99)
Potassium: 3.8 mmol/L (ref 3.5–5.1)
Sodium: 137 mmol/L (ref 135–145)
Total Bilirubin: 0.7 mg/dL (ref 0.3–1.2)
Total Protein: 6.3 g/dL — ABNORMAL LOW (ref 6.5–8.1)

## 2020-12-17 LAB — CBC WITH DIFFERENTIAL (CANCER CENTER ONLY)
Abs Immature Granulocytes: 0.02 10*3/uL (ref 0.00–0.07)
Basophils Absolute: 0 10*3/uL (ref 0.0–0.1)
Basophils Relative: 1 %
Eosinophils Absolute: 0.1 10*3/uL (ref 0.0–0.5)
Eosinophils Relative: 3 %
HCT: 36 % — ABNORMAL LOW (ref 39.0–52.0)
Hemoglobin: 13 g/dL (ref 13.0–17.0)
Immature Granulocytes: 0 %
Lymphocytes Relative: 18 %
Lymphs Abs: 1 10*3/uL (ref 0.7–4.0)
MCH: 37.8 pg — ABNORMAL HIGH (ref 26.0–34.0)
MCHC: 36.1 g/dL — ABNORMAL HIGH (ref 30.0–36.0)
MCV: 104.7 fL — ABNORMAL HIGH (ref 80.0–100.0)
Monocytes Absolute: 0.4 10*3/uL (ref 0.1–1.0)
Monocytes Relative: 7 %
Neutro Abs: 3.7 10*3/uL (ref 1.7–7.7)
Neutrophils Relative %: 71 %
Platelet Count: 134 10*3/uL — ABNORMAL LOW (ref 150–400)
RBC: 3.44 MIL/uL — ABNORMAL LOW (ref 4.22–5.81)
RDW: 13.1 % (ref 11.5–15.5)
WBC Count: 5.3 10*3/uL (ref 4.0–10.5)
nRBC: 0 % (ref 0.0–0.2)

## 2020-12-17 LAB — TSH: TSH: 13.17 u[IU]/mL — ABNORMAL HIGH (ref 0.320–4.118)

## 2020-12-17 NOTE — Progress Notes (Signed)
Hematology and Oncology Follow Up Visit  Basel Defalco [id number] 01-18-1946 75 y.o. 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  He presented with localized disease in 2019.   Prior Therapy:  He is status post laparoscopic right radical nephrectomy on May 31, 2018.  He is status post right hip and left lung radiation therapy completed in August 2021.  He received 50 Gray in 5 fractions  He is status post radiation to the right posterior acetabular lesion completed in November 2021.  He received at 50 Gray in 5 fractions.  He status post radiation therapy to the right and the left proximal femur for a total of 30 Gray in 10 fractions completed in February 2022.  Current therapy: Cabometyx 40 mg daily started on 05/28/2020.  Nivolumab added on September 20, 2020.  He is currently on Cabometyx alone.  Interim History: Mr. Ramus is here for a follow-up visit.  Since the last visit, he reports no major changes in his health.  He continues to tolerate Cabometyx alone without any major complaints.  He reports all the side effects associated with a combination with nivolumab has subsided.  He denies any nausea, abdominal pain or diarrhea.  He reports his strength is getting better and his mobility improved.  He is eating better as well and his edema has improved.    Medications: Updated on review. Current Outpatient Medications  Medication Sig Dispense Refill   allopurinol (ZYLOPRIM) 300 MG tablet Take 300 mg by mouth at bedtime.      amLODipine (NORVASC) 10 MG tablet Take 1 tablet (10 mg total) by mouth daily. 180 tablet 3   atorvastatin (LIPITOR) 40 MG tablet Take 40 mg by mouth at bedtime.      cabozantinib (CABOMETYX) 40 MG tablet TAKE 1 TABLET (40 MG TOTAL) BY MOUTH DAILY. TAKE ON AN EMPTY STOMACH, 1 HOUR BEFORE OR 2 HOURS AFTER MEALS. 30  tablet 0   Calcium-Magnesium-Vitamin D (CALCIUM 1200+D3 PO) Take 600 mg by mouth 2 (two) times daily.     colchicine 0.6 MG tablet Take 2 tabs once followed by 1 tab an hour later for gout flare (total 1.8 mg dose/course). Do not repeat for at least 3 days.     diazepam (VALIUM) 5 MG tablet Take 2.5-5 mg by mouth every 12 (twelve) hours as needed (for eye twitching).      furosemide (LASIX) 20 MG tablet Take 1 tablet (20 mg total) by mouth daily for 14 days. 14 tablet 0   hydrocortisone 2.5 % cream Apply 1 application topically 2 (two) times daily as needed (for hemorroidal flare ups).     levothyroxine (SYNTHROID) 25 MCG tablet Take 1 tablet (25 mcg total) by mouth daily before breakfast. 60 tablet 3   magnesium 30 MG tablet Take 30 mg by mouth 2 (two) times daily.     Multiple Vitamin (MULTIVITAMIN) tablet Take 1 tablet by mouth daily.     pantoprazole (PROTONIX) 40 MG tablet Take 40 mg by mouth daily.     potassium chloride SA (KLOR-CON) 20 MEQ tablet Take 1 tablet (20 mEq total) by mouth 2 (two) times daily. (Patient taking differently: Take 10 mEq by mouth 2 (two) times daily. Patient unable to tolerate 20 meq bid, is taking 10 meq bid.) 60 tablet 3   pseudoephedrine-acetaminophen (TYLENOL SINUS) 30-500 MG TABS tablet Take 1-2 tablets  by mouth every 4 (four) hours as needed (for sinus issues).     Simethicone 180 MG CAPS Take 180 mg by mouth 3 (three) times daily as needed (for gas/indigestion.).     traMADol (ULTRAM) 50 MG tablet Take 1 tablet (50 mg total) by mouth every 6 (six) hours as needed. 60 tablet 0   No current facility-administered medications for this visit.     Allergies:  Allergies  Allergen Reactions   Bee Venom Swelling   Budesonide-Formoterol Fumarate Other (See Comments)    RESPIRATORY ISSUES   Poison [employer] and ivy   Clindamycin/Lincomycin Rash     Physical Exam:     Blood pressure (!) 138/92, pulse (!) 103, temperature (!) 97.5 F  (36.4 C), temperature source Tympanic, resp. rate 20, height 5\' 9"  (1.753 m), weight 179 lb 9.6 oz (81.5 kg), SpO2 97 %.     ECOG: 1   General appearance: Comfortable appearing without any discomfort Head: Normocephalic without any trauma Oropharynx: Mucous membranes are moist and pink without any thrush or ulcers. Eyes: Pupils are equal and round reactive to light. Lymph nodes: No cervical, supraclavicular, inguinal or axillary lymphadenopathy.   Heart:regular rate and rhythm.  S1 and S2 1+ edema at the ankles. Lung: Clear without any rhonchi or wheezes.  No dullness to percussion. Abdomin: Soft, nontender, nondistended with good bowel sounds.  No hepatosplenomegaly. Musculoskeletal: No joint deformity or effusion.  Full range of motion noted. Neurological: No deficits noted on motor, sensory and deep tendon reflex exam. Dermatological: No rashes or lesions noted on skin examination.           Lab Results: Lab Results  Component Value Date   WBC 5.1 11/19/2020   HGB 12.2 (L) 11/19/2020   HCT 34.6 (L) 11/19/2020   MCV 107.8 (H) 11/19/2020   PLT 183 11/19/2020     Chemistry      Component Value Date/Time   NA 137 11/19/2020 0858   K 3.6 11/19/2020 0858   CL 100 11/19/2020 0858   CO2 27 11/19/2020 0858   BUN 11 11/19/2020 0858   CREATININE 1.06 11/19/2020 0858      Component Value Date/Time   CALCIUM 7.5 (L) 11/19/2020 0858   ALKPHOS 75 11/19/2020 0858   AST 38 11/19/2020 0858   ALT 34 11/19/2020 0858   BILITOT 0.6 11/19/2020 0858         IMPRESSION: 1. New area of mild consolidative change at the LEFT lung base, likely post infectious or inflammatory. Please correlate with any recent history of infection, attention on follow-up. 2. Scattered pulmonary small nodules unchanged. 3. Post RIGHT nephrectomy.  No new or progressive findings. 4. Destructive bone lesions without change. 5. Three-vessel coronary artery disease. 6. Emphysema and aortic  atherosclerosis.   Aortic Atherosclerosis (ICD10-I70.0) and Emphysema (ICD10-J43.9).       Impression and Plan:   75 year old man with:   1.  Stage IV clear-cell renal cell carcinoma with bone involvement and pulmonary metastasis diagnosed in 2021.   His disease status was updated at this time and treatment choices were reviewed.  He is currently on single agent Cabometyx after poor tolerance to immunotherapy.  CT scan obtained on 12/13/2020 was personally reviewed and discussed with the patient and showed overall stable disease without any progression.  Risks and benefits of continuing this treatment were discussed.  Reintroducing nivolumab versus nivolumab and ipilimumab option were discussed.   After discussion today, we opted to  continue with Cabometyx at 40 mg dosing alone for better tolerance.    3.  Prognosis and goals of care: His disease is incurable although aggressive measures are warranted given his reasonable performance status.  4.  GI complications: These include diarrhea nausea and alteration of taste associated with Cabometyx were reviewed.  These are manageable and very limited at this time.   5.  Immune mediated complications: He is negative experience any complications at this time.  We will update his thyroid studies and replace as needed.  Other complication clinic pneumonitis hypophysitis among others were reiterated.  6.  Hypocalcemia: Improved with replacement therapy at this time.  I recommended continued calcium supplement today.  Calcium levels from today it remains relatively low.  7.  Lower extremity edema: Currently on Lasix with improved  8.  CNS considerations: MRI of the brain obtained on Nov 05, 2020 did not show any evidence of metastatic disease.   9. Follow up: In 1 month for repeat follow-up.     30  minutes were dedicated to this encounter.  The time was spent on reviewing laboratory data, imaging studies, discussing treatment options and  complications related to therapy.  [name], MD 6/13/20229:52 AM

## 2020-12-19 ENCOUNTER — Other Ambulatory Visit (HOSPITAL_COMMUNITY): Payer: Self-pay

## 2020-12-20 ENCOUNTER — Other Ambulatory Visit (HOSPITAL_COMMUNITY): Payer: Self-pay

## 2020-12-20 ENCOUNTER — Encounter: Payer: Self-pay | Admitting: Oncology

## 2020-12-20 ENCOUNTER — Other Ambulatory Visit: Payer: Self-pay | Admitting: Oncology

## 2020-12-20 MED ORDER — CABOMETYX 40 MG PO TABS
40.0000 mg | ORAL_TABLET | Freq: Every day | ORAL | 0 refills | Status: DC
  Filled 2020-12-20: qty 30, 30d supply, fill #0

## 2020-12-24 ENCOUNTER — Ambulatory Visit (INDEPENDENT_AMBULATORY_CARE_PROVIDER_SITE_OTHER): Payer: Medicare Other | Admitting: Psychologist

## 2020-12-24 DIAGNOSIS — F411 Generalized anxiety disorder: Secondary | ICD-10-CM | POA: Diagnosis not present

## 2020-12-25 ENCOUNTER — Other Ambulatory Visit (HOSPITAL_COMMUNITY): Payer: Self-pay

## 2021-01-03 ENCOUNTER — Other Ambulatory Visit (HOSPITAL_BASED_OUTPATIENT_CLINIC_OR_DEPARTMENT_OTHER): Payer: Self-pay

## 2021-01-04 ENCOUNTER — Telehealth: Payer: Self-pay | Admitting: *Deleted

## 2021-01-04 NOTE — Telephone Encounter (Signed)
Wife Hoyle Sauer states James Massey's behavior has been completely different for the past 5 days. "He has not been himself, forgetful, in sloth mode with CNA, has a far-off look and takes a long time to respond to questions". She says it's like dementia, but has just happened in  last week. Has appt with new psychiatrist on Tuesday.   Also states she thinks he's having some type of chemical reaction to his meds- mouth at times had been bluish green, "like he was eating colorful birthday cake". Has also noted this color in underwear from urine and stool. Has not eaten anything that would have caused this . She just found it odd and wanted to make Dr Alen Blew aware.

## 2021-01-04 NOTE — Telephone Encounter (Signed)
Dr Alen Blew states he does not feel this is cancer or medication related. Spoke with Hoyle Sauer and she states this makes her feel better.

## 2021-01-08 ENCOUNTER — Encounter: Payer: Self-pay | Admitting: Behavioral Health

## 2021-01-08 ENCOUNTER — Ambulatory Visit (INDEPENDENT_AMBULATORY_CARE_PROVIDER_SITE_OTHER): Payer: Medicare Other | Admitting: Behavioral Health

## 2021-01-08 ENCOUNTER — Other Ambulatory Visit: Payer: Self-pay

## 2021-01-08 VITALS — BP 123/76 | HR 103 | Ht 69.0 in

## 2021-01-08 DIAGNOSIS — R630 Anorexia: Secondary | ICD-10-CM

## 2021-01-08 DIAGNOSIS — F411 Generalized anxiety disorder: Secondary | ICD-10-CM

## 2021-01-08 DIAGNOSIS — R4189 Other symptoms and signs involving cognitive functions and awareness: Secondary | ICD-10-CM

## 2021-01-08 DIAGNOSIS — F331 Major depressive disorder, recurrent, moderate: Secondary | ICD-10-CM | POA: Diagnosis not present

## 2021-01-08 DIAGNOSIS — R634 Abnormal weight loss: Secondary | ICD-10-CM

## 2021-01-08 MED ORDER — MIRTAZAPINE 15 MG PO TABS
15.0000 mg | ORAL_TABLET | Freq: Every day | ORAL | 1 refills | Status: DC
Start: 2021-01-08 — End: 2021-01-28

## 2021-01-08 MED ORDER — SERTRALINE HCL 50 MG PO TABS: ORAL_TABLET | ORAL | 1 refills | Status: DC

## 2021-01-08 NOTE — Progress Notes (Signed)
[facility] MD/PA/NP Initial Note  01/08/2021 4:04 PM James Massey  MRN:  [id number]  Chief Complaint:  Chief Complaint   Anxiety; Depression; Establish Care; Medication Problem; Patient Education; cognitive decline; Fatigue     HPI:  75 year old male presents to this office for initial visit and to establish care. His wife is present with consent. He says that he has been receiving cancer treatment since 2019 and is "just tired". He says that he has lack of energy and just wants "to get around better, and just live life as long as I have".  He says that cancer is located in several areas in the body including the lung. His first diagnosis with cancer was Kidney cancer in 2019 and then metastasized to his bones. His [name] says that he has experience rapid decline since June, 2022 with periods of garbled speech, interrupted sleep, confusion at times, and lethargy. He is now needing assisted from CNA who comes to assist with care 4 days per week for 4-5 hours. He now has to use walker due to extreme weakness. He also has shuffling gait with walker. He continues to lose weight and overall has lost 57 pounds from his baseline. Pt says that he would like medication to just help him cope more with his depression and anxiety as well as sleep and appetite. Patients agrees to follow up with neurology consult. He reports anxiety today at 5/10 and depression 6/10. He understands it to be mostly situational related to his health decline. Pt appears to be tearful at times and distressed. No mania reported. No current psychosis, auditory or visual hallucinations. No SI/Hi.  Patient and spouse unable to provide list of past psychotropic medications except: Valium   Visit Diagnosis:    ICD-10-CM   1. Generalized anxiety disorder  F41.1 sertraline (ZOLOFT) 50 MG tablet    mirtazapine (REMERON) 15 MG tablet    2. Major depressive disorder, recurrent episode, moderate (HCC)  F33.1 sertraline (ZOLOFT) 50  MG tablet    mirtazapine (REMERON) 15 MG tablet    3. Cognitive changes  R41.89     4. Appetite impaired  R63.0     5. Loss of weight  R63.4       Past Psychiatric History: unknown at this time  Past Medical History:  Past Medical History:  Diagnosis Date   Anxiety    Barrett esophagus    GERD (gastroesophageal reflux disease)    Gout    Heart murmur    History of anemia    Kidney cancer, primary, 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 to other site [facility])    Mixed hyperlipidemia    Renal cell cancer, right ([place])    Right renal mass    Wears glasses     Past Surgical History:  Procedure Laterality Date   CYSTOSCOPY WITH RETROGRADE PYELOGRAM, URETEROSCOPY AND STENT PLACEMENT Right 05/21/2018   Procedure: RIGHT RETROGRADE PYELOGRAM, RIGHT DIAGNOSTIC URETEROSCOPY AND STENT PLACEMENT;  Surgeon: Ardis Hughs, MD;  Location: [facility];  Service: Urology;  Laterality: Right;   INGUINAL HERNIA REPAIR Bilateral 09/2012   LAPAROSCOPIC NEPHRECTOMY, HAND ASSISTED Right 05/31/2018   Procedure: LAPAROSCOPIC RADICAL RIGHT NEPHRECTOMY;  Surgeon: Ardis Hughs, MD;  Location: [facility];  Service: Urology;  Laterality: Right;    Family Psychiatric History: see chart  Family History:  Family History  Problem Relation Age of Onset   Breast cancer Mother    Heart attack Father    Prostate cancer Neg Hx  Pancreatic cancer Neg Hx    Colon cancer Neg Hx     Social History:  Social History   Socioeconomic History   Marital status: Married    Spouse name: Not on file   Number of children: 2   Years of education: 16   Highest education level: Bachelor's degree (e.g., BA, AB, BS)  Occupational History   Occupation: retired Biochemist, clinical  Tobacco Use   Smoking status: Former    Packs/day: 0.50    Years: 15.00    Pack years: 7.50    Types: Cigarettes    Quit date: 12/12/1987    Years since quitting: 33.0   Smokeless tobacco: Never  Vaping Use   Vaping Use: Never used  Substance and  Sexual Activity   Alcohol use: Yes    Comment: occasional beer   Drug use: Never   Sexual activity: Not Currently  Other Topics Concern   Not on file  Social History Narrative   Lives with wife in [place].  Has two grown natural children. Currently has active cancer in several locations   In body, but has not recently spread to new areas according to patient. Retired from [employer].   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   Social Determinants of Radio broadcast assistant Strain: Not on file  Food Insecurity: Not on file  Transportation Needs: Not on file  Physical Activity: Not on file  Stress: Not on file  Social Connections: Not on file    Allergies:  Allergies  Allergen Reactions   Bee Venom Swelling   Budesonide-Formoterol Fumarate Other (See Comments)    RESPIRATORY ISSUES   Poison [employer] and ivy   Clindamycin/Lincomycin Rash    Metabolic Disorder Labs: No results found for: HGBA1C, MPG No results found for: PROLACTIN No results found for: CHOL, TRIG, HDL, CHOLHDL, VLDL, LDLCALC Lab Results  Component Value Date   TSH 13.170 (H) 12/17/2020   TSH 12.281 (H) 11/19/2020    Therapeutic Level Labs: No results found for: LITHIUM No results found for: VALPROATE No components found for:  CBMZ  Current Medications: Current Outpatient Medications  Medication Sig Dispense Refill   allopurinol (ZYLOPRIM) 300 MG tablet Take 300 mg by mouth at bedtime.      atorvastatin (LIPITOR) 40 MG tablet Take 40 mg by mouth at bedtime.      cabozantinib (CABOMETYX) 40 MG tablet TAKE 1 TABLET (40 MG TOTAL) BY MOUTH DAILY. TAKE ON AN EMPTY STOMACH, 1 HOUR BEFORE OR 2 HOURS AFTER MEALS. 30 tablet 0   hydrocortisone 2.5 % cream Apply 1 application topically 2 (two) times daily as needed (for hemorroidal flare ups).     levothyroxine (SYNTHROID) 25 MCG tablet Take 1  tablet (25 mcg total) by mouth daily before breakfast. 60 tablet 3   mirtazapine (REMERON) 15 MG tablet Take 1 tablet (15 mg total) by mouth at bedtime. 30 tablet 1   Multiple Vitamin (MULTIVITAMIN) tablet Take 1 tablet by mouth daily.     pantoprazole (PROTONIX) 40 MG tablet Take 40 mg by mouth daily.     potassium chloride SA (KLOR-CON) 20 MEQ tablet Take 1 tablet (20 mEq total) by mouth 2 (two) times daily. (Patient taking differently: Take 10 mEq by mouth 2 (two) times daily. Patient unable to tolerate 20 meq bid, is taking 10 meq bid.) 60 tablet  3   sertraline (ZOLOFT) 50 MG tablet Take 1/2 tablet 25 mg daily for 7 days. Then take one whole tablet 50 mg daily. 30 tablet 1   Simethicone 180 MG CAPS Take 180 mg by mouth 3 (three) times daily as needed (for gas/indigestion.).     traMADol (ULTRAM) 50 MG tablet Take 1 tablet (50 mg total) by mouth every 6 (six) hours as needed. 60 tablet 0   amLODipine (NORVASC) 10 MG tablet Take 1 tablet (10 mg total) by mouth daily. 180 tablet 3   Calcium-Magnesium-Vitamin D (CALCIUM 1200+D3 PO) Take 600 mg by mouth 2 (two) times daily. (Patient not taking: Reported on 01/08/2021)     colchicine 0.6 MG tablet Take 2 tabs once followed by 1 tab an hour later for gout flare (total 1.8 mg dose/course). Do not repeat for at least 3 days.     diazepam (VALIUM) 5 MG tablet Take 2.5-5 mg by mouth every 12 (twelve) hours as needed (for eye twitching).      furosemide (LASIX) 20 MG tablet Take 1 tablet (20 mg total) by mouth daily for 14 days. 14 tablet 0   magnesium 30 MG tablet Take 30 mg by mouth 2 (two) times daily. (Patient not taking: Reported on 01/08/2021)     pseudoephedrine-acetaminophen (TYLENOL SINUS) 30-500 MG TABS tablet Take 1-2 tablets by mouth every 4 (four) hours as needed (for sinus issues). (Patient not taking: Reported on 01/08/2021)     No current facility-administered medications for this visit.    Medication Side Effects: none  Orders placed this  visit:  No orders of the defined types were placed in this encounter.   Psychiatric Specialty Exam:  Review of Systems  Constitutional:  Positive for chills, fatigue and unexpected weight change.  HENT:  Positive for congestion and tinnitus.    There were no vitals taken for this visit.There is no height or weight on file to calculate BMI.  General Appearance: Casual and Well Groomed  Eye Contact:  Good  Speech:  Clear and Coherent and Slow  Volume:  Decreased  Mood:  Depressed and Dysphoric  Affect:  Depressed and Flat  Thought Process:  Coherent  Orientation:  Full (Time, Place, and Person)  Thought Content: Logical   Suicidal Thoughts:  No  Homicidal Thoughts:  No  Memory:  Remote;   Good  Judgement:  Good  Insight:  Fair  Psychomotor Activity:  Decreased and Shuffling Gait  Concentration:  Concentration: Good  Recall:  Good  Fund of Knowledge: Good  Language: Good  Assets:  Desire for Improvement Physical Health Resilience Social Support  ADL's:  Impaired  Cognition: Impaired,  Mild  Prognosis:  Poor   Screenings:  PHQ2-9    Flowsheet Row Office Visit from 01/08/2021 in [facility]  PHQ-2 Total Score 2  PHQ-9 Total Score 7       Receiving Psychotherapy: No   Treatment Plan/Recommendations:  To start 1/2 tablet zoloft 25 mg daily for 7 days, then one whole tablet 50 mg daily. To start Remeron 15 mg at bedtime for sleep and assist in increasing appetite. To decrease Valium use over next two weeks. To only use if severe anxiety or panic occurs. Will follow up with neurology to assess cognitive decline/dementia To follow up this office in 4 weeks to reassess anxiety and depression. Greater than 50% of 60 min face to face time with patient was spent on counseling and coordination of care. We discussed lengthy history of cancer  and treatment as well as onset of increased anxiety and depression. Patient has impressive vocabulary and remote memory.  Patient does struggle with delay in verbalizing thought process. Patient is very week and has unstable gait. Uses walker. Discussed old RX of valium and its associated fall risk and its possible effects on cognition. Discussed with pt and spouse that it was hard to determine if acute cognitive or physical decline was due to current illness or past treatment. Explained that patients advanced age and trauma of treatment could be factor. Educated patient on risk of pseudoephedrine by elderly populations and recommended use of other safer decongestants. Escribed  Remeron and Zoloft to patients pharmacy.    Elwanda Brooklyn, NP

## 2021-01-09 ENCOUNTER — Ambulatory Visit: Payer: BLUE CROSS/BLUE SHIELD | Admitting: Psychologist

## 2021-01-09 ENCOUNTER — Encounter (HOSPITAL_COMMUNITY): Payer: Self-pay | Admitting: Internal Medicine

## 2021-01-09 ENCOUNTER — Telehealth: Payer: Self-pay | Admitting: *Deleted

## 2021-01-09 ENCOUNTER — Other Ambulatory Visit: Payer: Self-pay

## 2021-01-09 ENCOUNTER — Inpatient Hospital Stay (HOSPITAL_COMMUNITY)
Admission: EM | Admit: 2021-01-09 | Discharge: 2021-01-16 | DRG: 071 | Disposition: A | Discharge status: Skilled Nursing Facility | Payer: Medicare Other | Attending: Internal Medicine | Admitting: Internal Medicine

## 2021-01-09 ENCOUNTER — Emergency Department (HOSPITAL_COMMUNITY): Payer: Medicare Other

## 2021-01-09 DIAGNOSIS — E876 Hypokalemia: Secondary | ICD-10-CM | POA: Diagnosis present

## 2021-01-09 DIAGNOSIS — F419 Anxiety disorder, unspecified: Secondary | ICD-10-CM | POA: Diagnosis present

## 2021-01-09 DIAGNOSIS — Z9221 Personal history of antineoplastic chemotherapy: Secondary | ICD-10-CM

## 2021-01-09 DIAGNOSIS — C7951 Secondary malignant neoplasm of bone: Secondary | ICD-10-CM | POA: Diagnosis present

## 2021-01-09 DIAGNOSIS — Z20822 Contact with and (suspected) exposure to covid-19: Secondary | ICD-10-CM | POA: Diagnosis present

## 2021-01-09 DIAGNOSIS — E039 Hypothyroidism, unspecified: Secondary | ICD-10-CM | POA: Diagnosis present

## 2021-01-09 DIAGNOSIS — C649 Malignant neoplasm of unspecified kidney, except renal pelvis: Secondary | ICD-10-CM | POA: Diagnosis not present

## 2021-01-09 DIAGNOSIS — G9341 Metabolic encephalopathy: Principal | ICD-10-CM | POA: Diagnosis present

## 2021-01-09 DIAGNOSIS — E782 Mixed hyperlipidemia: Secondary | ICD-10-CM | POA: Diagnosis present

## 2021-01-09 DIAGNOSIS — Z79899 Other long term (current) drug therapy: Secondary | ICD-10-CM

## 2021-01-09 DIAGNOSIS — Z803 Family history of malignant neoplasm of breast: Secondary | ICD-10-CM

## 2021-01-09 DIAGNOSIS — R531 Weakness: Secondary | ICD-10-CM | POA: Diagnosis present

## 2021-01-09 DIAGNOSIS — Z91030 Bee allergy status: Secondary | ICD-10-CM

## 2021-01-09 DIAGNOSIS — Z79890 Hormone replacement therapy (postmenopausal): Secondary | ICD-10-CM

## 2021-01-09 DIAGNOSIS — M109 Gout, unspecified: Secondary | ICD-10-CM | POA: Diagnosis present

## 2021-01-09 DIAGNOSIS — E8809 Other disorders of plasma-protein metabolism, not elsewhere classified: Secondary | ICD-10-CM

## 2021-01-09 DIAGNOSIS — R443 Hallucinations, unspecified: Secondary | ICD-10-CM | POA: Diagnosis present

## 2021-01-09 DIAGNOSIS — Z881 Allergy status to other antibiotic agents status: Secondary | ICD-10-CM | POA: Diagnosis not present

## 2021-01-09 DIAGNOSIS — E538 Deficiency of other specified B group vitamins: Secondary | ICD-10-CM

## 2021-01-09 DIAGNOSIS — R197 Diarrhea, unspecified: Secondary | ICD-10-CM | POA: Diagnosis present

## 2021-01-09 DIAGNOSIS — F32A Depression, unspecified: Secondary | ICD-10-CM

## 2021-01-09 DIAGNOSIS — Z888 Allergy status to other drugs, medicaments and biological substances status: Secondary | ICD-10-CM | POA: Diagnosis not present

## 2021-01-09 DIAGNOSIS — K219 Gastro-esophageal reflux disease without esophagitis: Secondary | ICD-10-CM | POA: Diagnosis present

## 2021-01-09 DIAGNOSIS — Z8249 Family history of ischemic heart disease and other diseases of the circulatory system: Secondary | ICD-10-CM

## 2021-01-09 DIAGNOSIS — Z85528 Personal history of other malignant neoplasm of kidney: Secondary | ICD-10-CM | POA: Diagnosis not present

## 2021-01-09 DIAGNOSIS — F0390 Unspecified dementia without behavioral disturbance: Secondary | ICD-10-CM | POA: Diagnosis present

## 2021-01-09 DIAGNOSIS — R4182 Altered mental status, unspecified: Secondary | ICD-10-CM

## 2021-01-09 DIAGNOSIS — R600 Localized edema: Secondary | ICD-10-CM | POA: Diagnosis not present

## 2021-01-09 LAB — COMPREHENSIVE METABOLIC PANEL
ALT: 35 U/L (ref 0–44)
AST: 52 U/L — ABNORMAL HIGH (ref 15–41)
Albumin: 3.2 g/dL — ABNORMAL LOW (ref 3.5–5.0)
Alkaline Phosphatase: 71 U/L (ref 38–126)
Anion gap: 13 (ref 5–15)
BUN: 12 mg/dL (ref 8–23)
CO2: 26 mmol/L (ref 22–32)
Calcium: 5.3 mg/dL — CL (ref 8.9–10.3)
Chloride: 96 mmol/L — ABNORMAL LOW (ref 98–111)
Creatinine, Ser: 0.97 mg/dL (ref 0.61–1.24)
GFR, Estimated: >60 mL/min (ref >60)
Glucose, Bld: 84 mg/dL (ref 70–99)
Potassium: 3 mmol/L — ABNORMAL LOW (ref 3.5–5.1)
Sodium: 135 mmol/L (ref 135–145)
Total Bilirubin: 0.9 mg/dL (ref 0.3–1.2)
Total Protein: 6.6 g/dL (ref 6.5–8.1)

## 2021-01-09 LAB — URINALYSIS, ROUTINE W REFLEX MICROSCOPIC
Bilirubin Urine: NEGATIVE
Glucose, UA: NEGATIVE mg/dL
Hgb urine dipstick: NEGATIVE
Ketones, ur: NEGATIVE mg/dL
Leukocytes,Ua: NEGATIVE
Nitrite: NEGATIVE
Protein, ur: NEGATIVE mg/dL
Specific Gravity, Urine: 1.006 (ref 1.005–1.030)
pH: 7 (ref 5.0–8.0)

## 2021-01-09 LAB — CBC
HCT: 36.4 % — ABNORMAL LOW (ref 39.0–52.0)
Hemoglobin: 12.9 g/dL — ABNORMAL LOW (ref 13.0–17.0)
MCH: 37.6 pg — ABNORMAL HIGH (ref 26.0–34.0)
MCHC: 35.4 g/dL (ref 30.0–36.0)
MCV: 106.1 fL — ABNORMAL HIGH (ref 80.0–100.0)
Platelets: 172 10*3/uL (ref 150–400)
RBC: 3.43 MIL/uL — ABNORMAL LOW (ref 4.22–5.81)
RDW: 13.9 % (ref 11.5–15.5)
WBC: 6.8 10*3/uL (ref 4.0–10.5)
nRBC: 0 % (ref 0.0–0.2)

## 2021-01-09 LAB — CBG MONITORING, ED: Glucose-Capillary: 84 mg/dL (ref 70–99)

## 2021-01-09 LAB — MAGNESIUM: Magnesium: 0.6 mg/dL — CL (ref 1.7–2.4)

## 2021-01-09 IMAGING — CT CT HEAD W/O CM
3 series · 15 of 47 positions shown, 18 images · non-contrast
Comparison: [DATE]

CLINICAL DATA: Delirium, intermittent episodes of confusion over
past 3 weeks. History renal cell carcinoma, former smoker

EXAM:
CT HEAD WITHOUT CONTRAST
TECHNIQUE: Contiguous axial images were obtained from the base of the skull
through the vertex without intravenous contrast. Sagittal and
coronal MPR images reconstructed from axial data set.

[Series 2: head wo · axial · 0.45mm/px · z∈[-65,+70]mm · 9 of 33 slices shown, 12 images]
[im 3/33  brain]
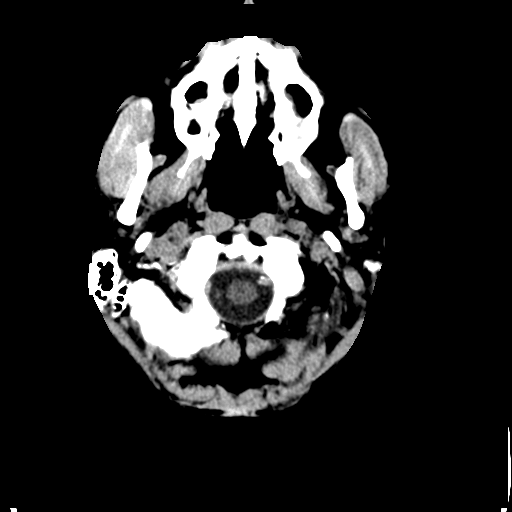
[im 3/33  bone]
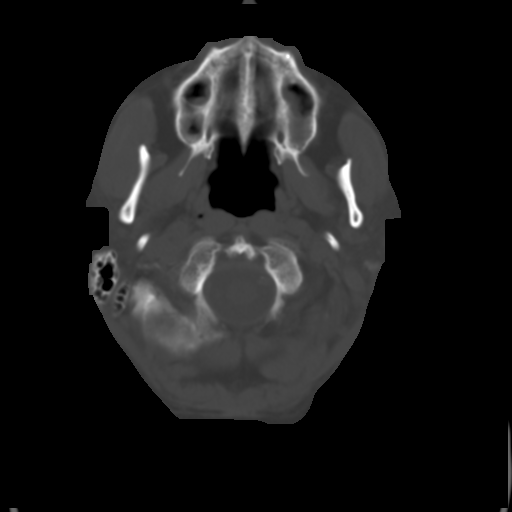
[im 6/33  brain]
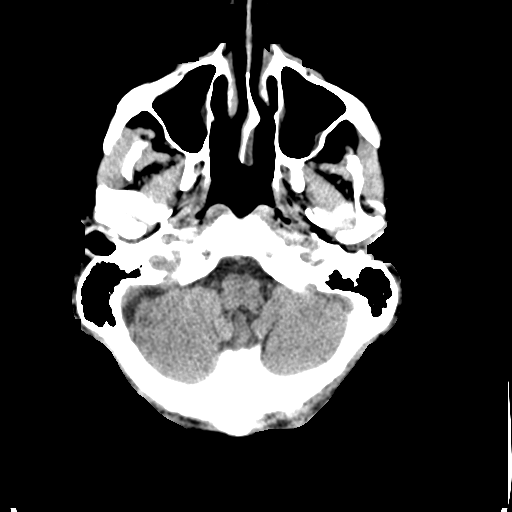
[im 9/33  brain]
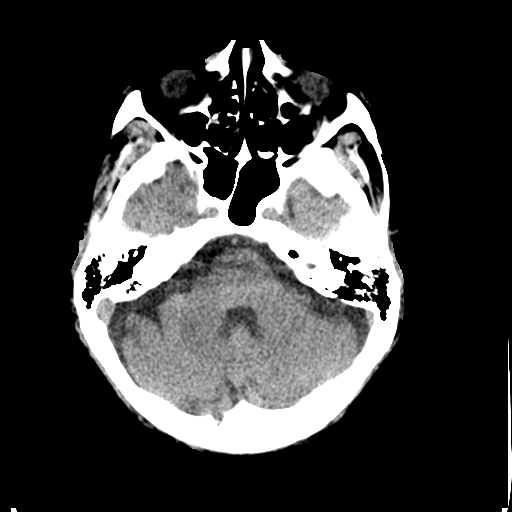
[im 13/33  brain]
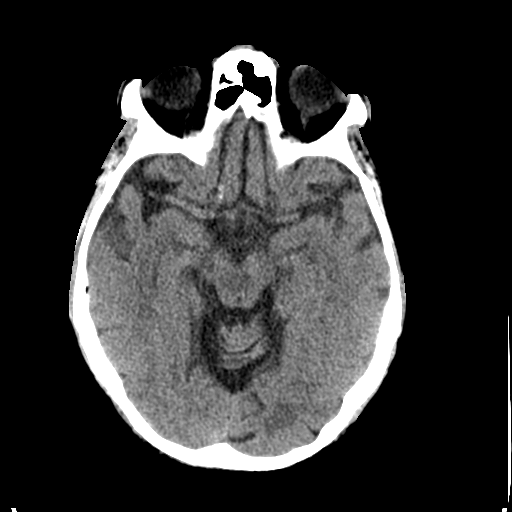
[im 17/33  brain]
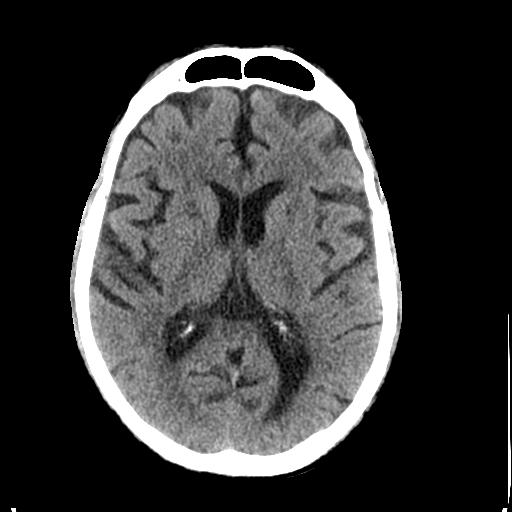
[im 17/33  bone]
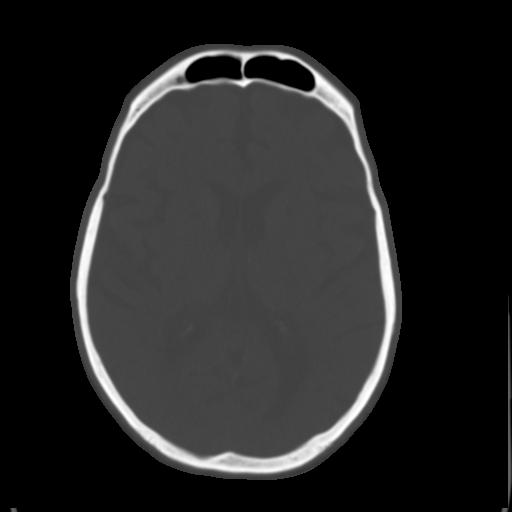
[im 20/33  brain]
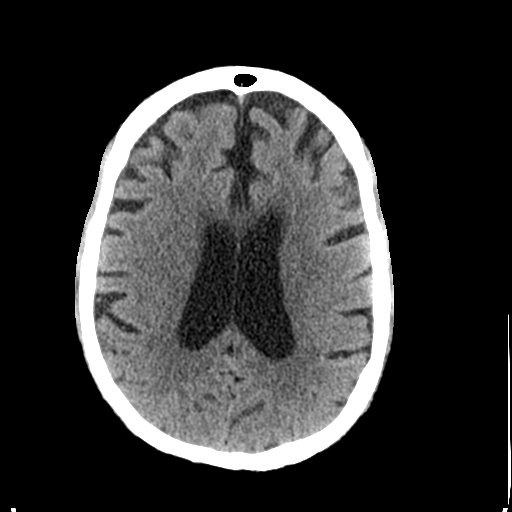
[im 24/33  brain]
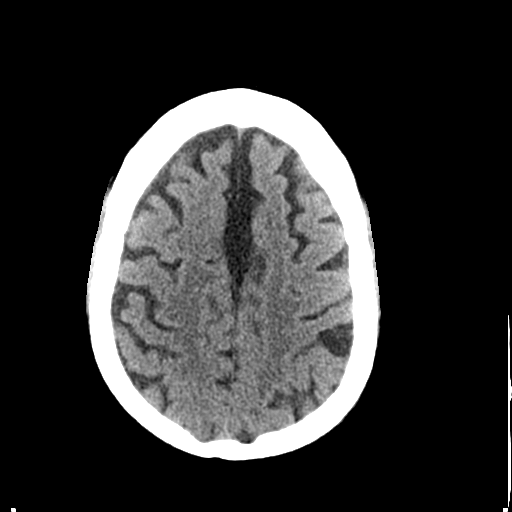
[im 27/33  brain]
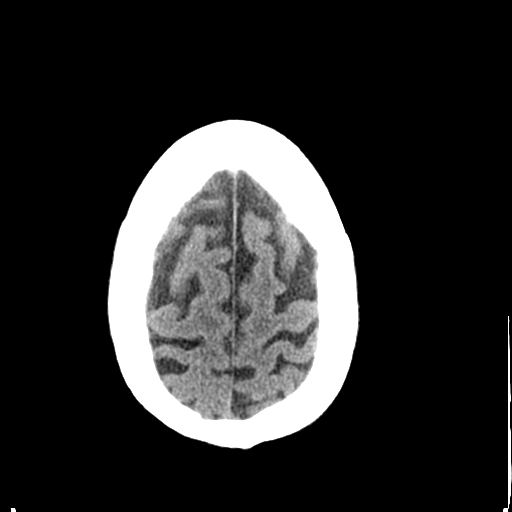
[im 30/33  brain]
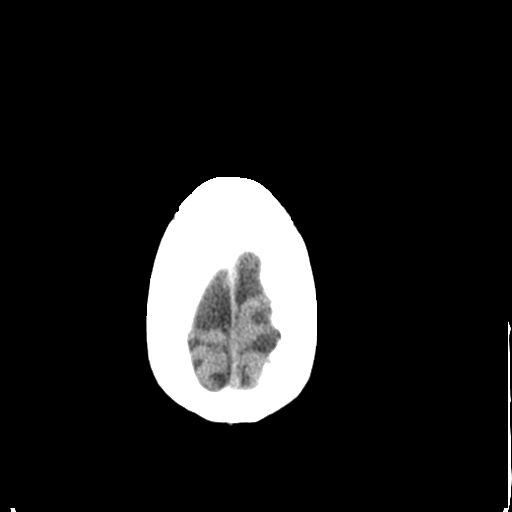
[im 30/33  bone]
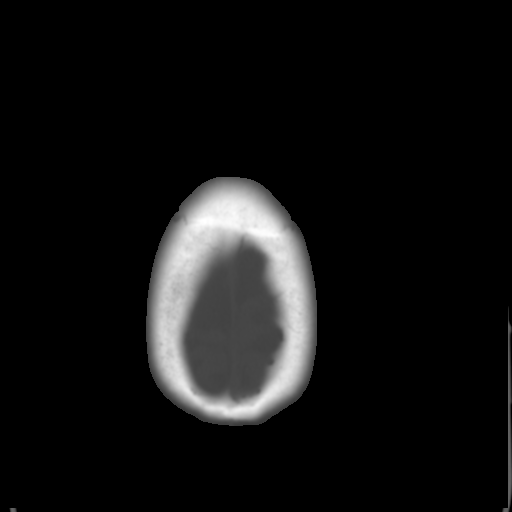

[Series 4: coronal soft tissue · coronal · 0.33mm/px · 3 of 78 slices shown]
[im 26/78  brain]
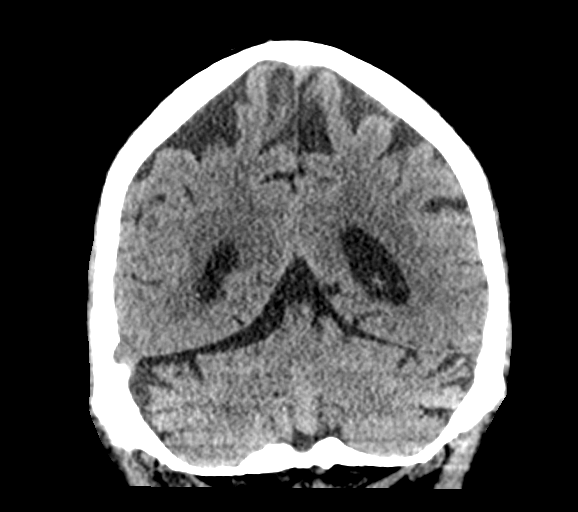
[im 35/78  brain]
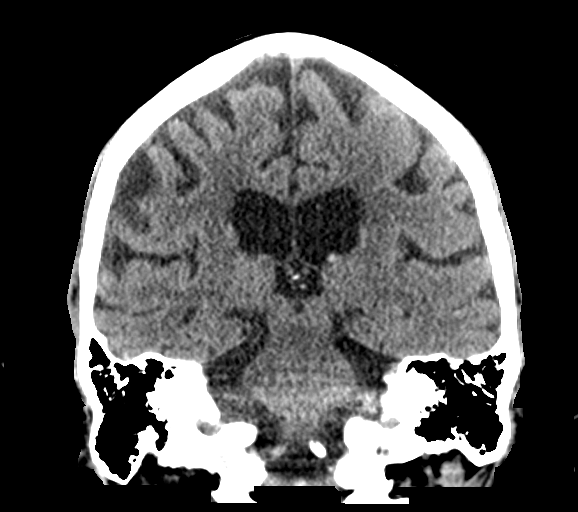
[im 43/78  brain]
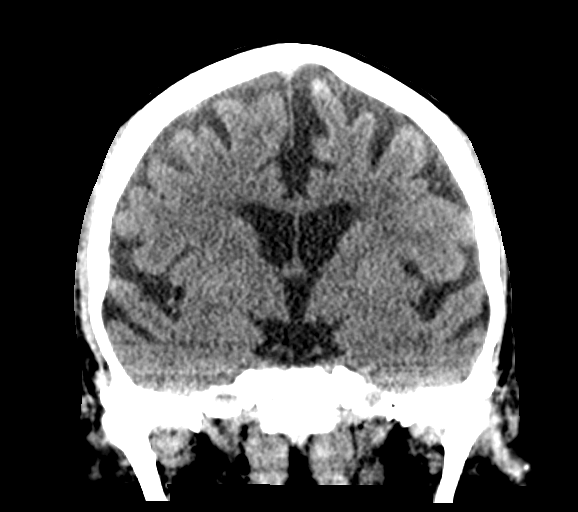

[Series 5: sagittal soft tissue · sagittal · 0.35mm/px · 3 of 56 slices shown]
[im 19/56  brain]
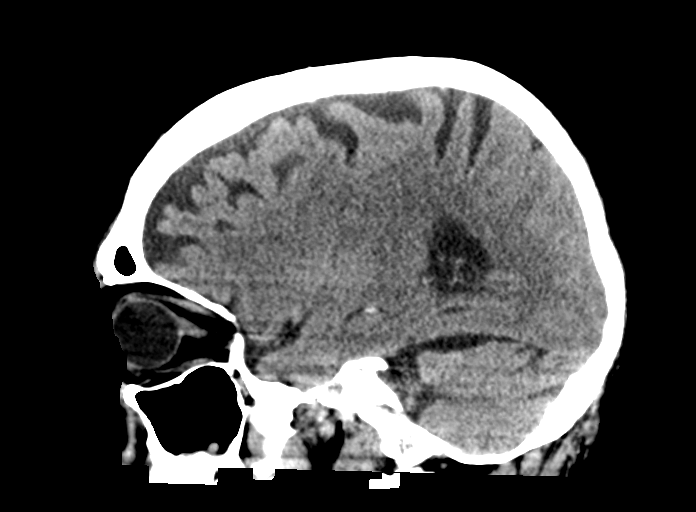
[im 28/56  brain]
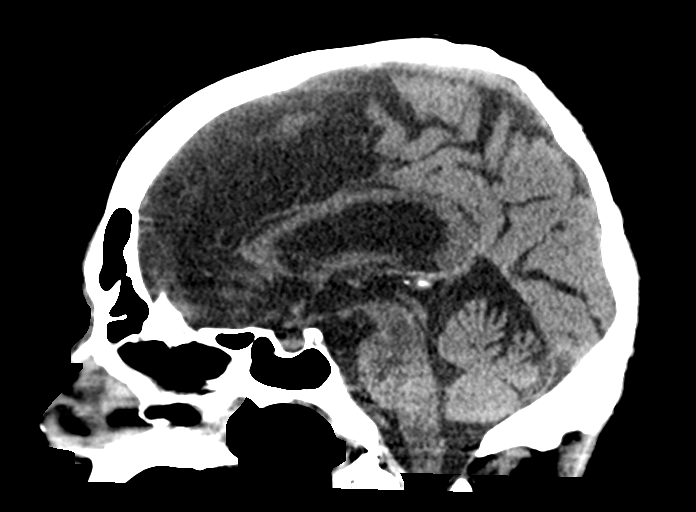
[im 37/56  brain]
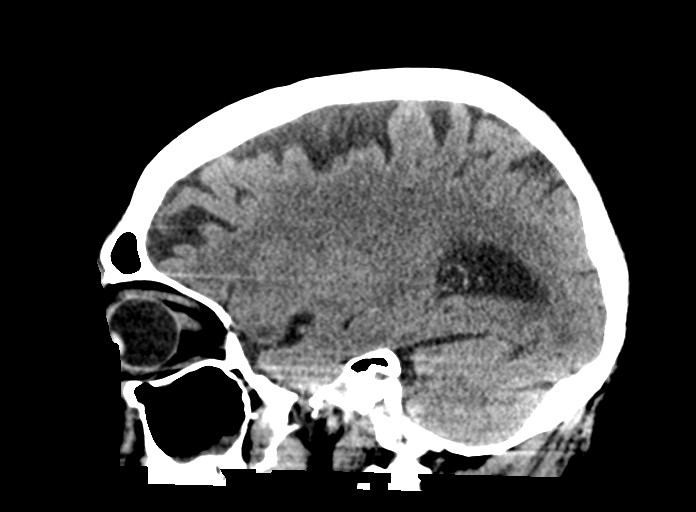

[15 of 47 positions shown; findings below may reference images not displayed]

FINDINGS: Brain: Generalized atrophy. Normal ventricular morphology. No
midline shift or mass effect. Scattered small vessel chronic
ischemic changes of deep cerebral white matter. No intracranial
hemorrhage, mass lesion or evidence of acute infarction. No
extra-axial fluid collections.

Vascular: Atherosclerotic calcification of internal carotid and LEFT
vertebral arteries at skull base. No hyperdense vessels.

Skull: Intact

Sinuses/Orbits: Mucosal thickening BILATERAL maxillary sinuses.

Other: N/A
IMPRESSION: Atrophy with small vessel chronic ischemic changes of deep cerebral
white matter.

No acute intracranial abnormalities.

## 2021-01-09 MED ORDER — PANTOPRAZOLE SODIUM 40 MG PO TBEC
40.0000 mg | DELAYED_RELEASE_TABLET | Freq: Every day | ORAL | Status: DC
  Administered 2021-01-10 – 2021-01-16 (×7): 40 mg via ORAL
  Filled 2021-01-09 (×7): qty 1

## 2021-01-09 MED ORDER — SERTRALINE HCL 25 MG PO TABS
25.0000 mg | ORAL_TABLET | Freq: Every day | ORAL | Status: DC
  Administered 2021-01-10 – 2021-01-11 (×2): 25 mg via ORAL
  Filled 2021-01-09 (×2): qty 1

## 2021-01-09 MED ORDER — ACETAMINOPHEN 650 MG RE SUPP
650.0000 mg | Freq: Four times a day (QID) | RECTAL | Status: DC | PRN
Start: 2021-01-09 — End: 2021-01-17

## 2021-01-09 MED ORDER — MIRTAZAPINE 15 MG PO TABS
15.0000 mg | ORAL_TABLET | Freq: Every day | ORAL | Status: DC
  Administered 2021-01-09 – 2021-01-11 (×3): 15 mg via ORAL
  Filled 2021-01-09: qty 2
  Filled 2021-01-09 (×2): qty 1

## 2021-01-09 MED ORDER — SENNOSIDES-DOCUSATE SODIUM 8.6-50 MG PO TABS: 1.0000 | ORAL_TABLET | Freq: Every evening | ORAL | Status: DC | PRN

## 2021-01-09 MED ORDER — MAGNESIUM SULFATE 2 GM/50ML IV SOLN
2.0000 g | Freq: Once | INTRAVENOUS | Status: AC
  Administered 2021-01-09: 2 g via INTRAVENOUS
  Filled 2021-01-09: qty 50

## 2021-01-09 MED ORDER — ONDANSETRON HCL 4 MG/2ML IJ SOLN: 4.0000 mg | Freq: Four times a day (QID) | INTRAMUSCULAR | Status: DC | PRN

## 2021-01-09 MED ORDER — POTASSIUM CHLORIDE 10 MEQ/100ML IV SOLN
10.0000 meq | INTRAVENOUS | Status: AC
  Administered 2021-01-09 (×4): 10 meq via INTRAVENOUS
  Filled 2021-01-09 (×4): qty 100

## 2021-01-09 MED ORDER — ALLOPURINOL 300 MG PO TABS
300.0000 mg | ORAL_TABLET | Freq: Every day | ORAL | Status: DC
  Administered 2021-01-09 – 2021-01-16 (×8): 300 mg via ORAL
  Filled 2021-01-09 (×8): qty 1

## 2021-01-09 MED ORDER — ATORVASTATIN CALCIUM 40 MG PO TABS
40.0000 mg | ORAL_TABLET | Freq: Every day | ORAL | Status: DC
  Administered 2021-01-09 – 2021-01-16 (×7): 40 mg via ORAL
  Filled 2021-01-09 (×7): qty 1

## 2021-01-09 MED ORDER — ONDANSETRON HCL 4 MG PO TABS: 4.0000 mg | ORAL_TABLET | Freq: Four times a day (QID) | ORAL | Status: DC | PRN

## 2021-01-09 MED ORDER — SERTRALINE HCL 50 MG PO TABS: 50.0000 mg | ORAL_TABLET | Freq: Every day | ORAL | Status: DC

## 2021-01-09 MED ORDER — ADULT MULTIVITAMIN W/MINERALS CH
1.0000 | ORAL_TABLET | Freq: Every day | ORAL | Status: DC
  Administered 2021-01-10 – 2021-01-16 (×7): 1 via ORAL
  Filled 2021-01-09 (×7): qty 1

## 2021-01-09 MED ORDER — SERTRALINE HCL 50 MG PO TABS: 25.0000 mg | ORAL_TABLET | Freq: Every day | ORAL | Status: DC

## 2021-01-09 MED ORDER — CALCIUM GLUCONATE-NACL 1-0.675 GM/50ML-% IV SOLN
1.0000 g | Freq: Once | INTRAVENOUS | Status: AC
  Administered 2021-01-09: 1000 mg via INTRAVENOUS
  Filled 2021-01-09: qty 50

## 2021-01-09 MED ORDER — MAGNESIUM SULFATE 2 GM/50ML IV SOLN: 2.0000 g | Freq: Once | INTRAVENOUS | Status: DC

## 2021-01-09 MED ORDER — LEVOTHYROXINE SODIUM 25 MCG PO TABS
25.0000 ug | ORAL_TABLET | Freq: Every day | ORAL | Status: DC
  Administered 2021-01-10 – 2021-01-13 (×4): 25 ug via ORAL
  Filled 2021-01-09 (×4): qty 1

## 2021-01-09 MED ORDER — CABOZANTINIB S-MALATE 40 MG PO TABS: 40.0000 mg | ORAL_TABLET | Freq: Every day | ORAL | Status: DC

## 2021-01-09 MED ORDER — TRAMADOL HCL 50 MG PO TABS
50.0000 mg | ORAL_TABLET | Freq: Four times a day (QID) | ORAL | Status: DC | PRN
  Administered 2021-01-09: 50 mg via ORAL
  Filled 2021-01-09: qty 1

## 2021-01-09 MED ORDER — ENOXAPARIN SODIUM 40 MG/0.4ML IJ SOSY
40.0000 mg | PREFILLED_SYRINGE | INTRAMUSCULAR | Status: DC
  Administered 2021-01-10 – 2021-01-15 (×6): 40 mg via SUBCUTANEOUS
  Filled 2021-01-09 (×6): qty 0.4

## 2021-01-09 MED ORDER — SIMETHICONE 80 MG PO CHEW
160.0000 mg | CHEWABLE_TABLET | Freq: Three times a day (TID) | ORAL | Status: DC | PRN
  Filled 2021-01-09: qty 2

## 2021-01-09 MED ORDER — POTASSIUM CHLORIDE 10 MEQ/100ML IV SOLN
10.0000 meq | Freq: Once | INTRAVENOUS | Status: AC
  Administered 2021-01-09: 10 meq via INTRAVENOUS
  Filled 2021-01-09: qty 100

## 2021-01-09 MED ORDER — ACETAMINOPHEN 325 MG PO TABS
650.0000 mg | ORAL_TABLET | Freq: Four times a day (QID) | ORAL | Status: DC | PRN
  Administered 2021-01-16: 650 mg via ORAL
  Filled 2021-01-09: qty 2

## 2021-01-09 NOTE — ED Provider Notes (Signed)
[facility] DEPT Provider Note   CSN: [id number] Arrival date & time: 01/09/21  1341     History Chief Complaint  Patient presents with   Altered Mental Status    James Massey is a 75 y.o. male.  HPI He presents for evaluation of confusion which started acutely around 5 AM this morning.  His wife gives the history.  She describes the confusion as the patient being unaware of, and unable to do, his usual activities of daily living.  Includes walking with his walker, talking about day-to-day activities, understanding the conversation, understanding things that he sees on TV, and repeated questions about current, recent and remote events.  Over the last month he has been gradually more anxious and depressed, and worrying about side effects of medications.  Yesterday he saw a psychiatric provider and was prescribed Remeron.  He has not started the Remeron yet.  The psychiatry provider also recommend that he follow-up with a neurologist for possible dementia evaluation.  Patient has not previously been diagnosed with dementia.  He is being treated for renal cell carcinoma, and has had recent comprehensive evaluations, and told that he is doing better from that perspective.  He has had some burning with urination recently but no hematuria.  He is now having fever, vomiting, or inability to eat.  There are no other known active modifying factors.    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[place]) 01/31/2020   History of right nephrectomy 08/13/2018   Renal mass, left 05/31/2018   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   Barrett's esophagus 08/30/2015   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    Past Surgical History:  Procedure Laterality Date   CYSTOSCOPY WITH RETROGRADE PYELOGRAM, URETEROSCOPY AND STENT PLACEMENT Right 05/21/2018   Procedure: RIGHT RETROGRADE PYELOGRAM, RIGHT DIAGNOSTIC URETEROSCOPY AND STENT PLACEMENT;  Surgeon: Ardis Hughs, MD;  Location: [facility];  Service: Urology;  Laterality: Right;   INGUINAL HERNIA REPAIR Bilateral 09/2012   LAPAROSCOPIC NEPHRECTOMY, HAND ASSISTED Right 05/31/2018   Procedure: LAPAROSCOPIC RADICAL RIGHT NEPHRECTOMY;  Surgeon: Ardis Hughs, MD;  Location: [facility];  Service: Urology;  Laterality: Right;       Family History  Problem Relation Age of Onset   Breast cancer Mother    Heart attack Father    Prostate cancer Neg Hx    Pancreatic cancer Neg Hx    Colon cancer Neg Hx     Social History   Tobacco Use   Smoking status: Former    Packs/day: 0.50    Years: 15.00    Pack years: 7.50    Types: Cigarettes    Quit date: 12/12/1987    Years since quitting: 33.1   Smokeless tobacco: Never  Vaping Use   Vaping Use: Never used  Substance Use Topics   Alcohol use: Yes  Comment: occasional beer   Drug use: Never    Home Medications Prior to Admission medications   Medication Sig Start Date End Date Taking? Authorizing Provider  allopurinol (ZYLOPRIM) 300 MG tablet Take 300 mg by mouth at bedtime.     [provider]  amLODipine (NORVASC) 10 MG tablet Take 1 tablet (10 mg total) by mouth daily. 07/18/20 10/16/20  Geralynn Rile, MD  atorvastatin (LIPITOR) 40 MG tablet Take 40 mg by mouth at bedtime.     [provider]  cabozantinib (CABOMETYX) 40 MG tablet TAKE 1 TABLET (40 MG TOTAL) BY MOUTH DAILY. TAKE ON AN EMPTY  STOMACH, 1 HOUR BEFORE OR 2 HOURS AFTER MEALS. 12/20/20   [name], MD  Calcium-Magnesium-Vitamin D (CALCIUM 1200+D3 PO) Take 600 mg by mouth 2 (two) times daily. Patient not taking: Reported on 01/08/2021    [provider]  colchicine 0.6 MG tablet Take 2 tabs once followed by 1 tab an hour later for gout flare (total 1.8 mg dose/course). Do not repeat for at least 3 days. 08/04/18   [provider]  diazepam (VALIUM) 5 MG tablet Take 2.5-5 mg by mouth every 12 (twelve) hours as needed (for eye twitching).     [provider]  furosemide (LASIX) 20 MG tablet Take 1 tablet (20 mg total) by mouth daily for 14 days. 10/05/20 10/19/20  [name], MD  hydrocortisone 2.5 % cream Apply 1 application topically 2 (two) times daily as needed (for hemorroidal flare ups).    [provider]  levothyroxine (SYNTHROID) 25 MCG tablet Take 1 tablet (25 mcg total) by mouth daily before breakfast. 10/15/20   [name], MD  magnesium 30 MG tablet Take 30 mg by mouth 2 (two) times daily. Patient not taking: Reported on 01/08/2021    [provider]  mirtazapine (REMERON) 15 MG tablet Take 1 tablet (15 mg total) by mouth at bedtime. 01/08/21   Elwanda Brooklyn, NP  Multiple Vitamin (MULTIVITAMIN) tablet Take 1 tablet by mouth daily.    [provider]  pantoprazole (PROTONIX) 40 MG tablet Take 40 mg by mouth daily.    [provider]  potassium chloride SA (KLOR-CON) 20 MEQ tablet Take 1 tablet (20 mEq total) by mouth 2 (two) times daily. Patient taking differently: Take 10 mEq by mouth 2 (two) times daily. Patient unable to tolerate 20 meq bid, is taking 10 meq bid. 10/15/20   [name], MD  pseudoephedrine-acetaminophen (TYLENOL SINUS) 30-500 MG TABS tablet Take 1-2 tablets by mouth every 4 (four) hours as needed (for sinus issues). Patient not taking: Reported on 01/08/2021    [provider]  sertraline (ZOLOFT) 50 MG tablet Take 1/2  tablet 25 mg daily for 7 days. Then take one whole tablet 50 mg daily. 01/08/21   Elwanda Brooklyn, NP  Simethicone 180 MG CAPS Take 180 mg by mouth 3 (three) times daily as needed (for gas/indigestion.).    [provider]  traMADol (ULTRAM) 50 MG tablet Take 1 tablet (50 mg total) by mouth every 6 (six) hours as needed. 11/19/20   [name], MD    Allergies    Bee venom, Budesonide-formoterol fumarate, Poison oak extract, and Clindamycin/lincomycin  Review of Systems   Review of Systems  All other systems reviewed and are negative.  Physical Exam Updated Vital Signs BP 124/88   Pulse 83   Temp 97.9 F (36.6 C) (Oral)   Resp 16  Ht 5\' 8"  (1.727 m)   Wt 83.9 kg   SpO2 97%   BMI 28.13 kg/m   Physical Exam Vitals and nursing note reviewed.  Constitutional:      General: He is not in acute distress.    Appearance: He is well-developed. He is not ill-appearing, toxic-appearing or diaphoretic.  HENT:     Head: Normocephalic and atraumatic.     Right Ear: External ear normal.     Left Ear: External ear normal.     Mouth/Throat:     Mouth: Mucous membranes are moist.  Eyes:     Conjunctiva/sclera: Conjunctivae normal.     Pupils: Pupils are equal, round, and reactive to light.  Neck:     Trachea: Phonation normal.  Cardiovascular:     Rate and Rhythm: Normal rate and regular rhythm.     Heart sounds: Normal heart sounds.  Pulmonary:     Effort: Pulmonary effort is normal.     Breath sounds: Normal breath sounds.  Abdominal:     General: There is no distension.     Palpations: Abdomen is soft.     Tenderness: There is no abdominal tenderness.  Musculoskeletal:        General: Normal range of motion.     Cervical back: Normal range of motion and neck supple.     Right lower leg: Edema present.     Left lower leg: Edema present.  Skin:    General: Skin is warm and dry.  Neurological:     Mental Status: He is alert.     Cranial Nerves: No cranial nerve  deficit.     Sensory: No sensory deficit.     Motor: No abnormal muscle tone.     Coordination: Coordination normal.  Psychiatric:        Mood and Affect: Mood normal.        Behavior: Behavior normal.     Comments: Conversant, confused, he appears withdrawn.    ED Results / Procedures / Treatments   Labs (all labs ordered are listed, but only abnormal results are displayed) Labs Reviewed  COMPREHENSIVE METABOLIC PANEL - Abnormal; Notable for the following components:      Result Value   Potassium 3.0 (*)    Chloride 96 (*)    Calcium 5.3 (*)    Albumin 3.2 (*)    AST 52 (*)    All other components within normal limits  CBC - Abnormal; Notable for the following components:   RBC 3.43 (*)    Hemoglobin 12.9 (*)    HCT 36.4 (*)    MCV 106.1 (*)    MCH 37.6 (*)    All other components within normal limits  MAGNESIUM - Abnormal; Notable for the following components:   Magnesium 0.6 (*)    All other components within normal limits  SARS CORONAVIRUS 2 (TAT 6-24 HRS)  URINALYSIS, ROUTINE W REFLEX MICROSCOPIC  CALCIUM, IONIZED  CBG MONITORING, ED    EKG   Date: 01/09/21  Rate: 81  Rhythm: normal sinus rhythm  QRS Axis: normal  PR and QT Intervals: normal  ST/T Wave abnormalities: normal  PR and QRS Conduction Disutrbances:none  Narrative Interpretation: PACs     Radiology No results found.  Procedures .Critical Care  Date/Time: 01/09/2021 4:42 PM Performed by: Daleen Bo, MD Authorized by: Daleen Bo, MD   Critical care provider statement:    Critical care time (minutes):  45   Critical care start time:  01/09/2021 2:40  PM   Critical care end time:  01/09/2021 4:42 PM   Critical care time was exclusive of:  Separately billable procedures and treating other patients   Critical care was necessary to treat or prevent imminent or life-threatening deterioration of the following conditions:  Metabolic crisis   Critical care was time spent personally by me on the  following activities:  Blood draw for specimens, development of treatment plan with patient or surrogate, discussions with consultants, evaluation of patient's response to treatment, examination of patient, obtaining history from patient or surrogate, ordering and performing treatments and interventions, ordering and review of laboratory studies, pulse oximetry, re-evaluation of patient's condition, review of old charts and ordering and review of radiographic studies   Medications Ordered in ED Medications  calcium gluconate 1 g/ 50 mL sodium chloride IVPB (1,000 mg Intravenous New Bag/Given 01/09/21 1616)  potassium chloride 10 mEq in 100 mL IVPB (has no administration in time range)  magnesium sulfate IVPB 2 g 50 mL (has no administration in time range)    ED Course  I have reviewed the triage vital signs and the nursing notes.  Pertinent labs & imaging results that were available during my care of the patient were reviewed by me and considered in my medical decision making (see chart for details).    MDM Rules/Calculators/A&P                           Patient Vitals for the past 24 hrs:  BP Temp Temp src Pulse Resp SpO2 Height Weight  01/09/21 1535 124/88 -- -- 83 16 97 % -- --  01/09/21 1358 -- -- -- -- -- -- 5\' 8"  (1.727 m) 83.9 kg  01/09/21 1357 121/89 97.9 F (36.6 C) Oral 92 16 96 % -- --    3:34 PM Reevaluation with update and discussion. After initial assessment and treatment, an updated evaluation reveals no change in clinical status.  Patient's wife thinks he is not taking his potassium as prescribed because it is hard to swallow the pill.  Findings discussed with patient and his wife, questions answered. Daleen Bo   Medical Decision Making:  This patient is presenting for evaluation of acute confusion, with ongoing depression and anxiety, which does require a range of treatment options, and is a complaint that involves a moderate risk of morbidity and mortality. The  differential diagnoses include delirium, acute infection, metabolic disorder, progression of renal cell carcinoma. I decided to review old records, and in summary elderly male, has metastatic cancer from renal cell carcinoma, and ongoing gait disability using a walker.  His wife has been concerned about some dementia symptoms over the last month he is presenting now with acute confusion which was first noticed this morning.  He has been no change in his ability to speak but he is confused. He can walk using his walker, but requires frequent assistance..  I obtained additional historical information from his wife at the bedside.  Clinical Laboratory Tests Ordered, included CBC, Metabolic panel, and Urinalysis.  Magnesium level.  Review indicates normal except magnesium low, potassium low, chloride low, calcium low, hemoglobin low. Radiologic Tests Ordered, included CT head.  I independently Visualized: Radiographic images, which show no acute abnormality  Cardiac Monitor Tracing which shows normal sinus rhythm    Critical Interventions-clinical evaluation, laboratory testing, medication treatment, observation,  reassessment, additional medication ordered.  After These Interventions, the Patient was reevaluated and was found with significant metabolic disorder.  Low estimated calcium despite taking these medications regularly.  His wife describes some difficulty taking medications.  CRITICAL CARE-yes Performed by: Daleen Bo  Nursing Notes Reviewed/ Care Coordinated Applicable Imaging Reviewed Interpretation of Laboratory Data incorporated into ED treatment  4:43 PM-Consult complete with hospitalist. Patient case explained and discussed. He agrees to admit patient for further evaluation and treatment. Call ended at 5:10 PM     Final Clinical Impression(s) / ED Diagnoses Final diagnoses:  Altered mental status, unspecified altered mental status type  Hypocalcemia  Hypokalemia   Hypomagnesemia    Rx / DC Orders ED Discharge Orders     None        Daleen Bo, MD 01/12/21 [phone]

## 2021-01-09 NOTE — H&P (Addendum)
History and Physical:    James Massey   TOI:[id number] DOB: 17-Jan-1946 DOA: 01/09/2021  Referring MD/provider: Daleen Bo, MD PCP: [name], MD   Patient coming from: Home  Chief Complaint: Altered mental status  History of Present Illness:   James Massey is a 75 y.o. 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  History was obtained from his wife at the bedside because the patient is confused.  According to his wife, patient has been eating poorly for the past few weeks.  Appetite has been poor and he has been very picky with food.  Patient admitted that he was depressed so his wife took him to a psychiatrist.  He saw Lesle Chris, NP, with psychiatric team as an outpatient.  Follow-up with neurologist was recommended for cognitive decline/dementia.  He is on calcium, magnesium and potassium supplements at home but he has not been taking them as prescribed.  Patient was noticed to have significant worsening confusion and inability to walk on the morning of admission so he was brought to the emergency room for further evaluation.  Normally he uses a walker at home and does some exercises with home health PT.  He was found to have severe hypomagnesemia, hypocalcemia and hypokalemia.  ED Course: Vital signs are stable in the emergency room.  The patient was given IV calcium gluconate 1 g, IV magnesium sulfate 2 g and IV potassium chloride 10 mEq in the emergency room.  ROS:   ROS unable to obtain because of confusion.   Past Medical History:   Past Medical History:  Diagnosis Date   Anxiety    Barrett esophagus    GERD (gastroesophageal reflux disease)    Gout    Heart murmur    History of anemia    Kidney cancer, primary, 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 to other site [facility])    Mixed  hyperlipidemia    Renal cell cancer, right ([place])    Right renal mass    Wears glasses     Past Surgical History:   Past Surgical History:  Procedure Laterality Date   CYSTOSCOPY WITH RETROGRADE PYELOGRAM, URETEROSCOPY AND STENT PLACEMENT Right 05/21/2018   Procedure: RIGHT RETROGRADE PYELOGRAM, RIGHT DIAGNOSTIC URETEROSCOPY AND STENT PLACEMENT;  Surgeon: Ardis Hughs, MD;  Location: [facility];  Service: Urology;  Laterality: Right;   INGUINAL HERNIA REPAIR Bilateral 09/2012   LAPAROSCOPIC NEPHRECTOMY, HAND ASSISTED Right 05/31/2018   Procedure: LAPAROSCOPIC RADICAL RIGHT NEPHRECTOMY;  Surgeon: Ardis Hughs, MD;  Location: [facility];  Service: Urology;  Laterality: Right;    Social History:   Social History   Socioeconomic History   Marital status: Married    Spouse name: Not on file   Number of children: 2   Years of education: 16   Highest education level: Bachelor's degree (e.g., BA, AB, BS)  Occupational History   Occupation: retired Biochemist, clinical  Tobacco Use   Smoking status: Former    Packs/day: 0.50    Years: 15.00    Pack years: 7.50    Types: Cigarettes    Quit date: 12/12/1987    Years since quitting: 33.1   Smokeless tobacco: Never  Vaping Use   Vaping Use: Never used  Substance and Sexual Activity   Alcohol use: Yes    Comment: occasional beer   Drug use: Never  Sexual activity: Not Currently  Other Topics Concern   Not on file  Social History Narrative   Lives with wife in [place].  Has two grown natural children. Currently has active cancer in several locations   In body, but has not recently spread to new areas according to patient. Retired from [employer].   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   Social Determinants of Radio broadcast assistant Strain: Not on file  Food Insecurity: Not on file  Transportation Needs: Not on file  Physical  Activity: Not on file  Stress: Not on file  Social Connections: Not on file  Intimate Partner Violence: Not on file    Allergies   Bee venom, Budesonide-formoterol fumarate, Poison oak extract, and Clindamycin/lincomycin  Family history:   Family History  Problem Relation Age of Onset   Breast cancer Mother    Heart attack Father    Prostate cancer Neg Hx    Pancreatic cancer Neg Hx    Colon cancer Neg Hx     Current Medications:   Prior to Admission medications   Medication Sig Start Date End Date Taking? Authorizing Provider  allopurinol (ZYLOPRIM) 300 MG tablet Take 300 mg by mouth at bedtime.     [provider]  amLODipine (NORVASC) 10 MG tablet Take 1 tablet (10 mg total) by mouth daily. 07/18/20 10/16/20  Geralynn Rile, MD  atorvastatin (LIPITOR) 40 MG tablet Take 40 mg by mouth at bedtime.     [provider]  cabozantinib (CABOMETYX) 40 MG tablet TAKE 1 TABLET (40 MG TOTAL) BY MOUTH DAILY. TAKE ON AN EMPTY STOMACH, 1 HOUR BEFORE OR 2 HOURS AFTER MEALS. 12/20/20   [name], MD  Calcium-Magnesium-Vitamin D (CALCIUM 1200+D3 PO) Take 600 mg by mouth 2 (two) times daily. Patient not taking: Reported on 01/08/2021    [provider]  colchicine 0.6 MG tablet Take 2 tabs once followed by 1 tab an hour later for gout flare (total 1.8 mg dose/course). Do not repeat for at least 3 days. 08/04/18   [provider]  diazepam (VALIUM) 5 MG tablet Take 2.5-5 mg by mouth every 12 (twelve) hours as needed (for eye twitching).     [provider]  furosemide (LASIX) 20 MG tablet Take 1 tablet (20 mg total) by mouth daily for 14 days. 10/05/20 10/19/20  [name], MD  hydrocortisone 2.5 % cream Apply 1 application topically 2 (two) times daily as needed (for hemorroidal flare ups).    [provider]  levothyroxine (SYNTHROID) 25 MCG tablet Take 1 tablet (25 mcg total) by mouth daily before breakfast. 10/15/20   [name], MD  magnesium 30 MG tablet Take 30 mg by mouth 2 (two) times daily. Patient not taking: Reported on 01/08/2021    [provider]  mirtazapine (REMERON) 15 MG tablet Take 1 tablet (15 mg total) by mouth at bedtime. 01/08/21   Elwanda Brooklyn, NP  Multiple Vitamin (MULTIVITAMIN) tablet Take 1 tablet by mouth daily.    [provider]  pantoprazole (PROTONIX) 40 MG tablet Take 40 mg by mouth daily.    [provider]  potassium chloride SA (KLOR-CON) 20 MEQ tablet Take 1 tablet (20 mEq total) by mouth 2 (two) times daily. Patient taking differently: Take 10 mEq by mouth 2 (two) times daily. Patient unable to tolerate 20 meq  bid, is taking 10 meq bid. 10/15/20   [name], MD  pseudoephedrine-acetaminophen (TYLENOL SINUS) 30-500 MG TABS tablet Take 1-2 tablets by mouth every 4 (four) hours as needed (for sinus issues). Patient not taking: Reported on 01/08/2021    [provider]  sertraline (ZOLOFT) 50 MG tablet Take 1/2 tablet 25 mg daily for 7 days. Then take one whole tablet 50 mg daily. 01/08/21   Elwanda Brooklyn, NP  Simethicone 180 MG CAPS Take 180 mg by mouth 3 (three) times daily as needed (for gas/indigestion.).    [provider]  traMADol (ULTRAM) 50 MG tablet Take 1 tablet (50 mg total) by mouth every 6 (six) hours as needed. 11/19/20   [name], MD    Physical Exam:   Vitals:   01/09/21 1357 01/09/21 1358 01/09/21 1535 01/09/21 1708  BP: 121/89  124/88 123/86  Pulse: 92  83 81  Resp: 16  16 16   Temp: 97.9 F (36.6 C)     TempSrc: Oral     SpO2: 96%  97% 98%  Weight:  83.9 kg    Height:  5\' 8"  (1.727 m)       Physical Exam: Blood pressure 123/86, pulse 81, temperature 97.9 F (36.6 C), temperature source Oral, resp. rate 16, height 5\' 8"  (1.727 m), weight 83.9 kg, SpO2 98 %. Gen: No acute distress. Head: Normocephalic, atraumatic. Eyes: Pupils equal, round and reactive to light. Extraocular movements intact.  Sclerae  nonicteric.  Mouth: Dry mucous membranes. Neck: Supple, no thyromegaly, no lymphadenopathy, no jugular venous distention. Chest: Lungs are clear to auscultation with good air movement. No rales, rhonchi or wheezes.  CV: Heart sounds are regular with an S1, S2. No murmurs, rubs or gallops.  Abdomen: Soft, nontender, nondistended with normal active bowel sounds. No palpable masses. Extremities: Extremities are without clubbing, or cyanosis.  Significant bilateral leg edema.  Skin: Warm and dry. No rashes,.  Neuro: Alert and oriented to person only, grossly nonfocal.  Psych: Poor insight and judgment.  Depressed mood.   Data Review:    Labs: Basic Metabolic Panel: Recent Labs  Lab 01/09/21 1414  NA 135  K 3.0*  CL 96*  CO2 26  GLUCOSE 84  BUN 12  CREATININE 0.97  CALCIUM 5.3*  MG 0.6*   Liver Function Tests: Recent Labs  Lab 01/09/21 1414  AST 52*  ALT 35  ALKPHOS 71  BILITOT 0.9  PROT 6.6  ALBUMIN 3.2*   No results for input(s): LIPASE, AMYLASE in the last 168 hours. No results for input(s): AMMONIA in the last 168 hours. CBC: Recent Labs  Lab 01/09/21 1414  WBC 6.8  HGB 12.9*  HCT 36.4*  MCV 106.1*  PLT 172   Cardiac Enzymes: No results for input(s): CKTOTAL, CKMB, CKMBINDEX, TROPONINI in the last 168 hours.  BNP (last 3 results) No results for input(s): PROBNP in the last 8760 hours. CBG: Recent Labs  Lab 01/09/21 1420  GLUCAP 84    Urinalysis    Component Value Date/Time   COLORURINE RED (A) 05/15/2018 0948   APPEARANCEUR TURBID (A) 05/15/2018 0948   LABSPEC  05/15/2018 0948    TEST NOT REPORTED DUE TO COLOR INTERFERENCE OF URINE PIGMENT   PHURINE  05/15/2018 0948    TEST NOT REPORTED DUE TO COLOR INTERFERENCE OF URINE PIGMENT   GLUCOSEU (A) 05/15/2018 0948    TEST NOT REPORTED DUE TO COLOR INTERFERENCE OF URINE PIGMENT   HGBUR (A) 05/15/2018 1610  TEST NOT REPORTED DUE TO COLOR INTERFERENCE OF URINE PIGMENT   BILIRUBINUR (A) 05/15/2018  0948    TEST NOT REPORTED DUE TO COLOR INTERFERENCE OF URINE PIGMENT   KETONESUR (A) 05/15/2018 0948    TEST NOT REPORTED DUE TO COLOR INTERFERENCE OF URINE PIGMENT   PROTEINUR (A) 05/15/2018 0948    TEST NOT REPORTED DUE TO COLOR INTERFERENCE OF URINE PIGMENT   NITRITE (A) 05/15/2018 0948    TEST NOT REPORTED DUE TO COLOR INTERFERENCE OF URINE PIGMENT   LEUKOCYTESUR (A) 05/15/2018 0948    TEST NOT REPORTED DUE TO COLOR INTERFERENCE OF URINE PIGMENT      Radiographic Studies: CT Head Wo Contrast  Result Date: 01/09/2021 CLINICAL DATA:  Delirium, intermittent episodes of confusion over past 3 weeks. History renal cell carcinoma, former smoker EXAM: CT HEAD WITHOUT CONTRAST TECHNIQUE: Contiguous axial images were obtained from the base of the skull through the vertex without intravenous contrast. Sagittal and coronal MPR images reconstructed from axial data set. COMPARISON:  10/23/2017 FINDINGS: Brain: Generalized atrophy. Normal ventricular morphology. No midline shift or mass effect. Scattered small vessel chronic ischemic changes of deep cerebral white matter. No intracranial hemorrhage, mass lesion or evidence of acute infarction. No extra-axial fluid collections. Vascular: Atherosclerotic calcification of internal carotid and LEFT vertebral arteries at skull base. No hyperdense vessels. Skull: Intact Sinuses/Orbits: Mucosal thickening BILATERAL maxillary sinuses. Other: N/A IMPRESSION: Atrophy with small vessel chronic ischemic changes of deep cerebral white matter. No acute intracranial abnormalities. Electronically Signed   By: Lavonia Dana M.D.   On: 01/09/2021 17:13    EKG: Ordered   Assessment/Plan:   Principal Problem:   Acute metabolic encephalopathy Active Problems:   Primary malignant neoplasm of kidney 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 to other site [facility])   Altered mental status   Hypokalemia   Hypomagnesemia   Hypocalcemia     Body mass index is 28.13 kg/m.   Acute  metabolic encephalopathy with suspected underlying cognitive decline: Admit to MedSurg.  Monitor on telemetry because of significant electrolyte abnormalities.  Severe hypomagnesemia: He was given 2 g of IV magnesium sulfate in the ED.  Repeat IV magnesium sulfate 2 g x 1 dose and monitor magnesium levels.    Hypocalcemia: He was given IV calcium gluconate 1 g x 1 dose in the emergency room.  Repeat BMP tomorrow. Check phosphorus and vitamin D levels  Hypokalemia: Replete potassium intravenously with IV potassium chloride (x4 doses) and monitor levels.  Depression: Continue mirtazapine and sertraline  Poor oral intake: Encourage use of nutritional supplements.  Consult dietitian.  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   Other information:   DVT prophylaxis: enoxaparin (LOVENOX) injection 40 mg Start: 01/10/21 1000Heparin  Code Status: Full code. Family Communication: Wife at bedside Disposition Plan: Home in 2 to 3 days Consults called: None Admission status: Inpatient  The medical decision making on this patient was of high complexity and the patient is at high risk for clinical deterioration, therefore this is a level 3 visit.    Time spent:  55 minutes  [facility] Pager: Please check [email or web address]   How to contact the [facility] Attending or Consulting provider [place] or covering provider during after hours [place], for this patient?   Check the care team in [facility] and look for a) attending/consulting [facility] provider listed and b) the [facility] team listed Log into [email or web address] and use [place]'s universal password to  access. If you do not have the password, please contact the hospital operator. Locate the [facility] provider you are looking for under [facility] and page to a number that you can be directly reached. If you still have difficulty reaching the provider, please page the [facility] (Director on Call) for the  Hospitalists listed on amion for assistance.  01/09/2021, 6:38 PM

## 2021-01-09 NOTE — Telephone Encounter (Signed)
Returned PC to patient's wife Hoyle Sauer.  She states patient needs to be seen today, he has had an extreme change in his mental status, is very confused, is unable to remember wife's name.  Instructed her patient needs to be seen in ED.  She verbalizes understanding.

## 2021-01-09 NOTE — ED Triage Notes (Signed)
Pt presents to ED via ems cc ams. Per ems, pt's wife states pt has intermittent episodes of confusion for the past 3 weeks. Pt Alert, oriented to person, place. Pt denies complaint, pain. Respirations equal, unlabored.

## 2021-01-10 ENCOUNTER — Other Ambulatory Visit (HOSPITAL_COMMUNITY): Payer: Self-pay

## 2021-01-10 LAB — PHOSPHORUS
Phosphorus: 3.6 mg/dL (ref 2.5–4.6)
Phosphorus: 3.6 mg/dL (ref 2.5–4.6)

## 2021-01-10 LAB — BASIC METABOLIC PANEL
Anion gap: 10 (ref 5–15)
BUN: 10 mg/dL (ref 8–23)
CO2: 27 mmol/L (ref 22–32)
Calcium: 5.7 mg/dL — CL (ref 8.9–10.3)
Chloride: 103 mmol/L (ref 98–111)
Creatinine, Ser: 1.1 mg/dL (ref 0.61–1.24)
GFR, Estimated: >60 mL/min (ref >60)
Glucose, Bld: 100 mg/dL — ABNORMAL HIGH (ref 70–99)
Potassium: 3.1 mmol/L — ABNORMAL LOW (ref 3.5–5.1)
Sodium: 140 mmol/L (ref 135–145)

## 2021-01-10 LAB — SARS CORONAVIRUS 2 (TAT 6-24 HRS): SARS Coronavirus 2: NEGATIVE

## 2021-01-10 LAB — CALCIUM, IONIZED: Calcium, Ionized, Serum: <3 mg/dL — ABNORMAL LOW (ref 4.5–5.6)

## 2021-01-10 LAB — VITAMIN D 25 HYDROXY (VIT D DEFICIENCY, FRACTURES): Vit D, 25-Hydroxy: 37.69 ng/mL (ref 30–100)

## 2021-01-10 LAB — MAGNESIUM: Magnesium: 0.8 mg/dL — CL (ref 1.7–2.4)

## 2021-01-10 MED ORDER — POTASSIUM CHLORIDE CRYS ER 20 MEQ PO TBCR
20.0000 meq | EXTENDED_RELEASE_TABLET | Freq: Once | ORAL | Status: AC
  Administered 2021-01-10: 20 meq via ORAL
  Filled 2021-01-10: qty 1

## 2021-01-10 MED ORDER — POTASSIUM CHLORIDE 10 MEQ/100ML IV SOLN
10.0000 meq | INTRAVENOUS | Status: AC
  Administered 2021-01-10 (×4): 10 meq via INTRAVENOUS
  Filled 2021-01-10 (×4): qty 100

## 2021-01-10 MED ORDER — MAGNESIUM SULFATE 2 GM/50ML IV SOLN
2.0000 g | INTRAVENOUS | Status: AC
  Administered 2021-01-10: 2 g via INTRAVENOUS
  Filled 2021-01-10: qty 50

## 2021-01-10 MED ORDER — HALOPERIDOL LACTATE 5 MG/ML IJ SOLN
2.0000 mg | Freq: Four times a day (QID) | INTRAMUSCULAR | Status: DC | PRN
  Administered 2021-01-12: 2 mg via INTRAMUSCULAR
  Filled 2021-01-10: qty 1

## 2021-01-10 MED ORDER — CALCIUM GLUCONATE-NACL 1-0.675 GM/50ML-% IV SOLN
1.0000 g | Freq: Once | INTRAVENOUS | Status: AC
  Administered 2021-01-10: 1000 mg via INTRAVENOUS
  Filled 2021-01-10: qty 50

## 2021-01-10 MED ORDER — ENSURE ENLIVE PO LIQD
237.0000 mL | Freq: Two times a day (BID) | ORAL | Status: DC
  Administered 2021-01-10 – 2021-01-14 (×6): 237 mL via ORAL

## 2021-01-10 MED ORDER — CALCIUM CARBONATE 1250 (500 CA) MG PO TABS
1.0000 | ORAL_TABLET | Freq: Three times a day (TID) | ORAL | Status: DC
  Administered 2021-01-10 – 2021-01-16 (×17): 500 mg via ORAL
  Filled 2021-01-10 (×18): qty 1

## 2021-01-10 MED ORDER — MAGNESIUM SULFATE 2 GM/50ML IV SOLN
2.0000 g | Freq: Once | INTRAVENOUS | Status: AC
  Administered 2021-01-10: 2 g via INTRAVENOUS
  Filled 2021-01-10: qty 50

## 2021-01-10 NOTE — Progress Notes (Addendum)
Progress Note    James Massey  AYT:[id number] DOB: 05-06-1946  DOA: 01/09/2021 PCP: James Portela, MD      Brief Narrative:    Medical records reviewed and are as summarized below:  James Massey is a 75 y.o. 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  History was obtained from his wife at the bedside because the patient is confused.  According to his wife, patient has been eating poorly for the past few weeks.  Appetite has been poor and he has been very picky with food.  Patient admitted that he was depressed so his wife took him to a psychiatrist.  He saw James Chris, NP, with psychiatric team as an outpatient.  Follow-up with neurologist was recommended for cognitive decline/dementia.   He is on calcium, magnesium and potassium supplements at home but he has not been taking them as prescribed.  Patient was noticed to have significant worsening confusion and inability to walk on the morning of admission so he was brought to the emergency room for further evaluation.  Normally he uses a walker at home and does some exercises with home health PT.   He was found to have severe hypomagnesemia, hypocalcemia and hypokalemia      Assessment/Plan:   Principal Problem:   Acute metabolic encephalopathy Active Problems:   Primary malignant neoplasm of kidney 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 to other site [facility] [address])   Altered mental status   Hypokalemia   Hypomagnesemia   Hypocalcemia   Nutrition Problem: Inadequate oral intake Etiology: lethargy/confusion  Signs/Symptoms: meal completion < 50%   Body mass index is 27.45 kg/m.  Acute metabolic encephalopathy with suspected underlying cognitive decline: Patient is still confused.  Continue supportive care   Severe hypomagnesemia: Magnesium is still low.   Treat with magnesium sulfate 4 g today.   Hypocalcemia: Repeat IV calcium gluconate 1 g x 1 dose today.  Phosphorus and vitamin D levels were within normal limits.  Hypokalemia: Continue potassium repletion with IV potassium chloride.   Depression: Continue mirtazapine and sertraline   Poor oral intake: Encourage use of nutritional supplements.  Consult dietitian.   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 Cabometyx can cause hypocalcemia, hypomagnesemia.  It is not clear whether Cabometyx is the cause of his hypocalcemia and hypomagnesemia.  Hold Cabometyx for now. Patient's wife has been advised to follow-up with oncologist as an outpatient regarding long-term treatment with Cabometyx.  Generalized weakness: Consulted PT and OT.  He may need SNF if weakness does not improve over time.   Diet Order             Diet regular Room service appropriate? Yes; Fluid consistency: Thin  Diet effective now                      Consultants: None  Procedures: None    Medications:    allopurinol  300 mg Oral QHS   atorvastatin  40 mg Oral QHS   cabozantinib  40 mg Oral Daily   calcium carbonate  1 tablet Oral TID WC   enoxaparin (LOVENOX) injection  40 mg Subcutaneous Q24H   feeding supplement  237 mL Oral BID BM   levothyroxine  25 mcg Oral Q0600   mirtazapine  15 mg Oral QHS  multivitamin with minerals  1 tablet Oral Daily   pantoprazole  40 mg Oral Daily   sertraline  25 mg Oral Daily   Followed by   Derrill Memo ON 01/17/2021] sertraline  50 mg Oral Daily   Continuous Infusions:  potassium chloride 10 mEq (01/10/21 1401)     Anti-infectives (From admission, onward)    None              Family Communication/Anticipated D/C date and plan/Code Status   DVT prophylaxis: enoxaparin (LOVENOX) injection 40 mg Start: 01/10/21 1000     Code Status: Full Code  Family Communication: His wife over the  phone Disposition Plan:    Status is: Inpatient  Remains inpatient appropriate because:IV treatments appropriate due to intensity of illness or inability to take PO and Inpatient level of care appropriate due to severity of illness  Dispo: The patient is from: Home              Anticipated d/c is to: Home              Patient currently is not medically stable to d/c.   Difficult to place patient No           Subjective:   Interval events noted.  He is confused and unable to provide any history.  Objective:    Vitals:   01/10/21 0249 01/10/21 0533 01/10/21 0922 01/10/21 1255  BP: 122/86 129/68 128/81 131/80  Pulse: 86 89 82 84  Resp: 16 16 15 15   Temp: 98.5 F (36.9 C) (!) 97.5 F (36.4 C) (!) 97.5 F (36.4 C) (!) 97.4 F (36.3 C)  TempSrc: Oral Oral Oral Oral  SpO2: 99% 100% 97% 97%  Weight:  81.9 kg    Height:       No data found.   Intake/Output Summary (Last 24 hours) at 01/10/2021 1413 Last data filed at 01/10/2021 1020 Gross per 24 hour  Intake 767.23 ml  Output 950 ml  Net -182.77 ml   Filed Weights   01/09/21 1358 01/10/21 0533  Weight: 83.9 kg 81.9 kg    Exam:  GEN: NAD SKIN: Warm and dry EYES: No pallor or icterus ENT: MMM CV: RRR PULM: CTA B ABD: soft, ND, NT, +BS CNS: Alert, oriented to person only, non focal EXT: Significant bilateral leg edema.  No tenderness        Data Reviewed:   I have personally reviewed following labs and imaging studies:  Labs: Labs show the following:   Basic Metabolic Panel: Recent Labs  Lab 01/09/21 1414 01/10/21 0412  NA 135 140  K 3.0* 3.1*  CL 96* 103  CO2 26 27  GLUCOSE 84 100*  BUN 12 10  CREATININE 0.97 1.10  CALCIUM 5.3* 5.7*  MG 0.6* 0.8*  PHOS  --  3.6  3.6   GFR Estimated Creatinine Clearance: 56.1 mL/min (by C-G formula based on SCr of 1.1 mg/dL). Liver Function Tests: Recent Labs  Lab 01/09/21 1414  AST 52*  ALT 35  ALKPHOS 71  BILITOT 0.9  PROT 6.6  ALBUMIN  3.2*   No results for input(s): LIPASE, AMYLASE in the last 168 hours. No results for input(s): AMMONIA in the last 168 hours. Coagulation profile No results for input(s): INR, PROTIME in the last 168 hours.  CBC: Recent Labs  Lab 01/09/21 1414  WBC 6.8  HGB 12.9*  HCT 36.4*  MCV 106.1*  PLT 172   Cardiac Enzymes: No results for input(s): CKTOTAL, CKMB,  CKMBINDEX, TROPONINI in the last 168 hours. BNP (last 3 results) No results for input(s): PROBNP in the last 8760 hours. CBG: Recent Labs  Lab 01/09/21 1420  GLUCAP 84   D-Dimer: No results for input(s): DDIMER in the last 72 hours. Hgb A1c: No results for input(s): HGBA1C in the last 72 hours. Lipid Profile: No results for input(s): CHOL, HDL, LDLCALC, TRIG, CHOLHDL, LDLDIRECT in the last 72 hours. Thyroid function studies: No results for input(s): TSH, T4TOTAL, T3FREE, THYROIDAB in the last 72 hours.  Invalid input(s): FREET3 Anemia work up: No results for input(s): VITAMINB12, FOLATE, FERRITIN, TIBC, IRON, RETICCTPCT in the last 72 hours. Sepsis Labs: Recent Labs  Lab 01/09/21 1414  WBC 6.8    Microbiology Recent Results (from the past 240 hour(s))  SARS CORONAVIRUS 2 (TAT 6-24 HRS) Nasopharyngeal Nasopharyngeal Swab     Status: None   Collection Time: 01/09/21  4:15 PM   Specimen: Nasopharyngeal Swab  Result Value Ref Range Status   SARS Coronavirus 2 NEGATIVE NEGATIVE Final    Comment: (NOTE) SARS-CoV-2 target nucleic acids are NOT DETECTED.  The SARS-CoV-2 RNA is generally detectable in upper and lower respiratory specimens during the acute phase of infection. Negative results do not preclude SARS-CoV-2 infection, do not rule out co-infections with other pathogens, and should not be used as the sole basis for treatment or other patient management decisions. Negative results must be combined with clinical observations, patient history, and epidemiological information. The expected result is  Negative.  Fact Sheet for Patients: SugarRoll.be  Fact Sheet for Healthcare Providers: https://www.woods-mathews.com/  This test is not yet approved or cleared by the [place] FDA and  has been authorized for detection and/or diagnosis of SARS-CoV-2 by FDA under an Emergency Use Authorization (EUA). This EUA will remain  in effect (meaning this test can be used) for the duration of the COVID-19 declaration under Se ction 564(b)(1) of the Act, 21 U.S.C. section 360bbb-3(b)(1), unless the authorization is terminated or revoked sooner.  Performed at [facility], [place] [address] [place], [place] [address]     Procedures and diagnostic studies:  CT Head Wo Contrast  Result Date: 01/09/2021 CLINICAL DATA:  Delirium, intermittent episodes of confusion over past 3 weeks. History renal cell carcinoma, former smoker EXAM: CT HEAD WITHOUT CONTRAST TECHNIQUE: Contiguous axial images were obtained from the base of the skull through the vertex without intravenous contrast. Sagittal and coronal MPR images reconstructed from axial data set. COMPARISON:  10/23/2017 FINDINGS: Brain: Generalized atrophy. Normal ventricular morphology. No midline shift or mass effect. Scattered small vessel chronic ischemic changes of deep cerebral white matter. No intracranial hemorrhage, mass lesion or evidence of acute infarction. No extra-axial fluid collections. Vascular: Atherosclerotic calcification of internal carotid and LEFT vertebral arteries at skull base. No hyperdense vessels. Skull: Intact Sinuses/Orbits: Mucosal thickening BILATERAL maxillary sinuses. Other: N/A IMPRESSION: Atrophy with small vessel chronic ischemic changes of deep cerebral white matter. No acute intracranial abnormalities. Electronically Signed   By: Lavonia Dana M.D.   On: 01/09/2021 17:13               LOS: 1 day   Karinda Cabriales  [facility]   Pager on [email or web address]  7PM-7AM, please contact night-coverage at [email or web address]     01/10/2021, 2:13 PM

## 2021-01-10 NOTE — ED Notes (Signed)
ED TO INPATIENT HANDOFF REPORT  ED Nurse Name and Phone #: Erick Colace, RN [phone]  S Name/Age/Gender James Massey 75 y.o. male Room/Bed: WA15/WA15  Code Status   Code Status: Full Code  Home/SNF/Other Home Patient oriented to: self, place and situation Is this baseline? Yes   Triage Complete: Triage complete  Chief Complaint Altered mental status [F16.38] Acute metabolic encephalopathy [G66.59]  Triage Note Pt presents to ED via ems cc ams. Per ems, pt's wife states pt has intermittent episodes of confusion for the past 3 weeks. Pt Alert, oriented to person, place. Pt denies complaint, pain. Respirations equal, unlabored.     Allergies Allergies  Allergen Reactions  . Bee Venom Swelling  . Budesonide-Formoterol Fumarate Other (See Comments)    RESPIRATORY ISSUES  . Poison [employer] and ivy  . Clindamycin/Lincomycin Rash    Level of Care/Admitting Diagnosis ED Disposition    ED Disposition  Admit   Condition  --   Comment  Hospital Area: [facility] [[id number]]  Level of Care: Med-Surg [16]  May admit patient to [facility] or [facility] if equivalent level of care is available:: Yes  Covid Evaluation: Symptomatic Person Under Investigation (PUI)  Diagnosis: Acute metabolic encephalopathy [9357017]  Admitting Physician: Rayburn Go  Attending Physician: Rayburn Go  Estimated length of stay: past midnight tomorrow  Certification:: I certify this patient will need inpatient services for at least 2 midnights         B Medical/Surgery History Past Medical History:  Diagnosis Date  . Anxiety   . Barrett esophagus   . GERD (gastroesophageal reflux disease)   . Gout   . Heart murmur   . History of anemia   . Kidney cancer, primary, 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 to other site [facility])   . Mixed hyperlipidemia   . Renal cell cancer, right ([place])   . Right renal mass   . Wears glasses    Past  Surgical History:  Procedure Laterality Date  . CYSTOSCOPY WITH RETROGRADE PYELOGRAM, URETEROSCOPY AND STENT PLACEMENT Right 05/21/2018   Procedure: RIGHT RETROGRADE PYELOGRAM, RIGHT DIAGNOSTIC URETEROSCOPY AND STENT PLACEMENT;  Surgeon: Ardis Hughs, MD;  Location: [facility]S;  Service: Urology;  Laterality: Right;  . INGUINAL HERNIA REPAIR Bilateral 09/2012  . LAPAROSCOPIC NEPHRECTOMY, HAND ASSISTED Right 05/31/2018   Procedure: LAPAROSCOPIC RADICAL RIGHT NEPHRECTOMY;  Surgeon: Ardis Hughs, MD;  Location: [facility];  Service: Urology;  Laterality: Right;     A IV Location/Drains/Wounds Patient Lines/Drains/Airways Status    Active Line/Drains/Airways    Name Placement date Placement time Site Days   Peripheral IV 04/11/20 Left Antecubital 04/11/20  1100  Antecubital  274   Peripheral IV 08/31/20 Left Antecubital 08/31/20  1242  Antecubital  132   Peripheral IV 01/09/21 20 G Left Antecubital 01/09/21  1516  Antecubital  1   Incision (Closed) 05/21/18 Perineum Right 05/21/18  1252  -- 965   Incision (Closed) 05/31/18 Abdomen Right 05/31/18  0833  -- 955   Incision - 3 Ports Abdomen 1: Upper;Umbilicus 2: Right;Medial 3: Right;Upper;Lateral 05/31/18  0830  -- 955          Intake/Output Last 24 hours  Intake/Output Summary (Last 24 hours) at 01/10/2021 0135 Last data filed at 01/09/2021 2342 Gross per 24 hour  Intake 424.21 ml  Output --  Net 424.21 ml    Labs/Imaging Results for orders placed or performed during the hospital encounter  of 01/09/21 (from the past 48 hour(s))  Comprehensive metabolic panel     Status: Abnormal   Collection Time: 01/09/21  2:14 PM  Result Value Ref Range   Sodium 135 135 - 145 mmol/L   Potassium 3.0 (L) 3.5 - 5.1 mmol/L   Chloride 96 (L) 98 - 111 mmol/L   CO2 26 22 - 32 mmol/L   Glucose, Bld 84 70 - 99 mg/dL    Comment: Glucose reference range applies only to samples taken after fasting for at least 8 hours.   BUN 12 8 - 23 mg/dL    Creatinine, Ser 0.97 0.61 - 1.24 mg/dL   Calcium 5.3 (LL) 8.9 - 10.3 mg/dL    Comment: CRITICAL RESULT CALLED TO, READ BACK BY AND VERIFIED WITH: JACOBS, D. RN @1529  01/09/21  KELLY, J.     Total Protein 6.6 6.5 - 8.1 g/dL   Albumin 3.2 (L) 3.5 - 5.0 g/dL   AST 52 (H) 15 - 41 U/L   ALT 35 0 - 44 U/L   Alkaline Phosphatase 71 38 - 126 U/L   Total Bilirubin 0.9 0.3 - 1.2 mg/dL   GFR, Estimated >60 >60 mL/min    Comment: (NOTE) Calculated using the CKD-EPI Creatinine Equation (2021)    Anion gap 13 5 - 15    Comment: Performed at [facility], [place] [address] [place], [place] [address]  CBC     Status: Abnormal   Collection Time: 01/09/21  2:14 PM  Result Value Ref Range   WBC 6.8 4.0 - 10.5 K/uL   RBC 3.43 (L) 4.22 - 5.81 MIL/uL   Hemoglobin 12.9 (L) 13.0 - 17.0 g/dL   HCT 36.4 (L) 39.0 - 52.0 %   MCV 106.1 (H) 80.0 - 100.0 fL   MCH 37.6 (H) 26.0 - 34.0 pg   MCHC 35.4 30.0 - 36.0 g/dL   RDW 13.9 11.5 - 15.5 %   Platelets 172 150 - 400 K/uL   nRBC 0.0 0.0 - 0.2 %    Comment: Performed at [facility], [place] [address] [place], [place] [address]  Magnesium     Status: Abnormal   Collection Time: 01/09/21  2:14 PM  Result Value Ref Range   Magnesium 0.6 (LL) 1.7 - 2.4 mg/dL    Comment: CRITICAL RESULT CALLED TO, READ BACK BY AND VERIFIED WITH: Rhona Leavens RN @1635  01/09/21 KELLY, J. Performed at [facility], [place] [address], [place], [place] [address]   CBG monitoring, ED     Status: None   Collection Time: 01/09/21  2:20 PM  Result Value Ref Range   Glucose-Capillary 84 70 - 99 mg/dL    Comment: Glucose reference range applies only to samples taken after fasting for at least 8 hours.  Urinalysis, Routine w reflex microscopic Urine, Clean Catch     Status: None   Collection Time: 01/09/21 10:43 PM  Result Value Ref Range   Color, Urine YELLOW YELLOW   APPearance CLEAR CLEAR   Specific Gravity, Urine 1.006 1.005 - 1.030    pH 7.0 5.0 - 8.0   Glucose, UA NEGATIVE NEGATIVE mg/dL   Hgb urine dipstick NEGATIVE NEGATIVE   Bilirubin Urine NEGATIVE NEGATIVE   Ketones, ur NEGATIVE NEGATIVE mg/dL   Protein, ur NEGATIVE NEGATIVE mg/dL   Nitrite NEGATIVE NEGATIVE   Leukocytes,Ua NEGATIVE NEGATIVE    Comment: Performed at [facility] [address] [place], [place] [address]   CT Head Wo  Contrast  Result Date: 01/09/2021 CLINICAL DATA:  Delirium, intermittent episodes of confusion over past 3 weeks. History renal cell carcinoma, former smoker EXAM: CT HEAD WITHOUT CONTRAST TECHNIQUE: Contiguous axial images were obtained from the base of the skull through the vertex without intravenous contrast. Sagittal and coronal MPR images reconstructed from axial data set. COMPARISON:  10/23/2017 FINDINGS: Brain: Generalized atrophy. Normal ventricular morphology. No midline shift or mass effect. Scattered small vessel chronic ischemic changes of deep cerebral white matter. No intracranial hemorrhage, mass lesion or evidence of acute infarction. No extra-axial fluid collections. Vascular: Atherosclerotic calcification of internal carotid and LEFT vertebral arteries at skull base. No hyperdense vessels. Skull: Intact Sinuses/Orbits: Mucosal thickening BILATERAL maxillary sinuses. Other: N/A IMPRESSION: Atrophy with small vessel chronic ischemic changes of deep cerebral white matter. No acute intracranial abnormalities. Electronically Signed   By: Lavonia Dana M.D.   On: 01/09/2021 17:13    Pending Labs Unresulted Labs (From admission, onward)    Start     Ordered   01/16/21 0500  Creatinine, serum  (enoxaparin (LOVENOX)    CrCl >/= 30 ml/min)  Weekly,   R     Comments: while on enoxaparin therapy    01/09/21 1831   01/10/21 7412  Basic metabolic panel  Tomorrow morning,   R        01/09/21 1831   01/10/21 0500  Magnesium  Tomorrow morning,   R        01/09/21 1831   01/10/21 0500  Phosphorus  Tomorrow morning,    R        01/09/21 1831   01/09/21 2114  Phosphorus  Add-on,   AD        01/09/21 2113   01/09/21 2114  VITAMIN D 25 Hydroxy (Vit-D Deficiency, Fractures)  Add-on,   AD        01/09/21 2113   01/09/21 1602  Calcium, ionized  Once,   STAT        01/09/21 1601   01/09/21 1538  SARS CORONAVIRUS 2 (TAT 6-24 HRS) Nasopharyngeal Nasopharyngeal Swab  (Tier 3 - Symptomatic/asymptomatic)  Once,   STAT       Question Answer Comment  Is this test for diagnosis or screening Screening   Symptomatic for COVID-19 as defined by CDC No   Hospitalized for COVID-19 No   Admitted to ICU for COVID-19 No   Previously tested for COVID-19 Yes   Resident in a congregate (group) care setting No   Employed in healthcare setting No   Has patient completed COVID vaccination(s) (2 doses of Pfizer/Moderna 1 dose of [employer]) Yes   Has patient completed COVID Booster / 3rd dose Unknown      01/09/21 1538          Vitals/Pain Today's Vitals   01/09/21 2300 01/09/21 2310 01/10/21 0000 01/10/21 0100  BP: 132/89  131/89 105/69  Pulse: 80  85 84  Resp: 16  15 17   Temp:    97.8 F (36.6 C)  TempSrc:    Oral  SpO2: 97%  95% 96%  Weight:      Height:      PainSc:  3       Isolation Precautions No active isolations  Medications Medications  allopurinol (ZYLOPRIM) tablet 300 mg (300 mg Oral Given 01/09/21 2238)  traMADol (ULTRAM) tablet 50 mg (50 mg Oral Given 01/09/21 2238)  cabozantinib (CABOMETYX) tablet 40 mg (has no administration in time range)  atorvastatin (LIPITOR) tablet 40  mg (40 mg Oral Given 01/09/21 2238)  mirtazapine (REMERON) tablet 15 mg (15 mg Oral Given 01/09/21 2237)  levothyroxine (SYNTHROID) tablet 25 mcg (has no administration in time range)  pantoprazole (PROTONIX) EC tablet 40 mg (has no administration in time range)  simethicone (MYLICON) chewable tablet 160 mg (has no administration in time range)  multivitamin with minerals tablet 1 tablet (has no administration in time  range)  enoxaparin (LOVENOX) injection 40 mg (has no administration in time range)  acetaminophen (TYLENOL) tablet 650 mg (has no administration in time range)    Or  acetaminophen (TYLENOL) suppository 650 mg (has no administration in time range)  senna-docusate (Senokot-S) tablet 1 tablet (has no administration in time range)  ondansetron (ZOFRAN) tablet 4 mg (has no administration in time range)    Or  ondansetron (ZOFRAN) injection 4 mg (has no administration in time range)  sertraline (ZOLOFT) tablet 25 mg (has no administration in time range)    Followed by  sertraline (ZOLOFT) tablet 50 mg (has no administration in time range)  calcium gluconate 1 g/ 50 mL sodium chloride IVPB (0 g Intravenous Stopped 01/09/21 1711)  potassium chloride 10 mEq in 100 mL IVPB (0 mEq Intravenous Stopped 01/09/21 1817)  potassium chloride 10 mEq in 100 mL IVPB (0 mEq Intravenous Stopped 01/09/21 2342)  magnesium sulfate IVPB 2 g 50 mL (0 g Intravenous Stopped 01/09/21 1941)    Mobility walks with device High fall risk   Focused Assessments    R Recommendations: See Admitting Provider Note  Report given to:   Additional Notes:

## 2021-01-10 NOTE — Progress Notes (Signed)
Initial Nutrition Assessment  INTERVENTION:   -Ensure Enlive po BID, each supplement provides 350 kcal and 20 grams of protein  -Magic cup BID with meals, each supplement provides 290 kcal and 9 grams of protein  NUTRITION DIAGNOSIS:   Inadequate oral intake related to lethargy/confusion as evidenced by meal completion < 50%.  GOAL:   Patient will meet greater than or equal to 90% of their needs  MONITOR:   PO intake, Supplement acceptance, Labs, Weight trends, I & O's  REASON FOR ASSESSMENT:   Consult Assessment of nutrition requirement/status  ASSESSMENT:   75 y.o. 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  Patient currently alert/oriented x 1. Patient has had poor PO and appetite for 3 weeks since his mental status declined. Pt's family reports pt with increasing confusion over the past few weeks.  Consumed 35% of breakfast this morning. Will order Ensure supplements as well as Magic Cups with meals.  Per weight records, pt has lost 24 lbs since 10/11 (11% wt loss x 10 months, insignificant for time frame).   Medications: OSCAL, Remeron, Multivitamin with minerals daily, KLOR-CON, Ca gluconate, IV Mg sulfate, IV KCl  Labs reviewed: Low K, Mg (.8)  NUTRITION - FOCUSED PHYSICAL EXAM:  Deferred.  Diet Order:   Diet Order             Diet regular Room service appropriate? Yes; Fluid consistency: Thin  Diet effective now                   EDUCATION NEEDS:   No education needs have been identified at this time  Skin:  Skin Assessment: Reviewed RN Assessment  Last BM:  PTA  Height:   Ht Readings from Last 1 Encounters:  01/09/21 5\' 8"  (1.727 m)    Weight:   Wt Readings from Last 1 Encounters:  01/10/21 81.9 kg    BMI:  Body mass index is 27.45 kg/m.  Estimated  Nutritional Needs:   Kcal:  2100-2300  Protein:  95-105g  Fluid:  2.1L/day  Clayton Bibles, MS, RD, LDN Inpatient Clinical Dietitian Contact information available via Amion

## 2021-01-10 NOTE — Evaluation (Signed)
Occupational Therapy Evaluation Patient Details Name: James Massey MRN: [id number] DOB: 30-Jul-1945 Today's Date: 01/10/2021    History of Present Illness patient is a 75 year old male who was admitted from home with altered mental status. patient was found to have hypomagnesmia and hypocalcemia. PMH; anxiety and kidney CA  with mets ( currently on chemo)   Clinical Impression   Patient is a confused 75 year old male who was admitted with above diagnosis. Patient was noted to have had a decline in the ability to participate in ADLs tasks at [facility]. Patient was noted to have increased cognitive changes, decreased safety awareness, decreased functional activity tolerance and endurance. currently patient is min a x 2 for transfer with increased cues for safety and to attend to tasks. Patient was max A for clothing management and hygiene tasks.  Patients wife was present at end of session with wife reporting that patient is not at prior level of functioning at this time. Wife reported having caregiver part time during the week.Patient could benefit going to SNF to maximize independence and safety prior to discharging home.   Follow Up Recommendations  SNF;Home health OT;Supervision/Assistance - 24 hour    Equipment Recommendations       Recommendations for Other Services       Precautions / Restrictions Precautions Precautions: Fall Restrictions Weight Bearing Restrictions: No      Mobility Bed Mobility               General bed mobility comments: Pt up in chair on arrival and returned to chair at session end to eat lunch    Transfers Overall transfer level: Needs assistance Equipment used: Rolling walker (2 wheeled) Transfers: Sit to/from Stand Sit to Stand: Min assist;+2 physical assistance;+2 safety/equipment         General transfer comment: cues for LE management and use of UEs to self assist    Balance Overall balance assessment: Needs assistance Sitting-balance  support: Feet supported Sitting balance-Leahy Scale: Good     Standing balance support: No upper extremity supported Standing balance-Leahy Scale: Poor                             ADL either performed or assessed with clinical judgement   ADL Overall ADL's : Needs assistance/impaired Eating/Feeding: Set up;Sitting   Grooming: Set up;Oral care;Wash/dry face;Wash/dry hands   Upper Body Bathing: Sitting;Minimal assistance;Cueing for sequencing   Lower Body Bathing: Sit to/from stand;Cueing for safety;Moderate assistance Lower Body Bathing Details (indicate cue type and reason): with caregiver present Upper Body Dressing : Set up   Lower Body Dressing: Cueing for safety;Moderate assistance;Sit to/from stand   Toilet Transfer: RW;Ambulation;Minimal assistance;+2 for safety/equipment   Toileting- Clothing Manipulation and Hygiene: Moderate assistance;Sit to/from stand       Functional mobility during ADLs: Minimal assistance;Rolling walker;Cueing for safety;+2 for safety/equipment       Vision Baseline Vision/History: Wears glasses Vision Assessment?: No apparent visual deficits     Perception     Praxis      Pertinent Vitals/Pain Pain Assessment: No/denies pain     Hand Dominance     Extremity/Trunk Assessment Upper Extremity Assessment Upper Extremity Assessment: Defer to OT evaluation   Lower Extremity Assessment Lower Extremity Assessment: Generalized weakness   Cervical / Trunk Assessment Cervical / Trunk Assessment: Kyphotic   Communication Communication Communication: No difficulties   Cognition Arousal/Alertness: Awake/alert Behavior During Therapy: Flat affect;Anxious Overall Cognitive Status: Impaired/Different  from baseline Area of Impairment: Orientation;Following commands;Safety/judgement;Memory;Problem solving                 Orientation Level: Place;Situation     Following Commands: Follows one step commands  inconsistently;Follows one step commands with increased time Safety/Judgement: Decreased awareness of safety   Problem Solving: Slow processing General Comments: Pt does not remember who wife is - wife reports he knew her yesterday   General Comments       Exercises     Shoulder Instructions      Home Living Family/patient expects to be discharged to:: Private residence Living Arrangements: Spouse/significant other Available Help at Discharge: Family Type of Home: House       Home Layout: Multi-level               Home Equipment: Environmental consultant - 2 wheels   Additional Comments: 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      Prior Functioning/Environment Level of Independence: Needs assistance  Gait / Transfers Assistance Needed: Spouse reports pt uses RW but recently had advanced to ambulating unassisted with RW ADL's / Homemaking Assistance Needed: patients wife reported that patient was able to complete all toileting needs MI with rolling walker. patient's wife reported keeping patient on strick schedule with caregiver to keep him active.            OT Problem List: Decreased strength;Impaired balance (sitting and/or standing);Decreased cognition;Decreased safety awareness;Decreased activity tolerance      OT Treatment/Interventions: Self-care/ADL training;Therapeutic activities;Balance training;Therapeutic exercise;Neuromuscular education;Patient/family education    OT Goals(Current goals can be found in the care plan section) Acute Rehab OT Goals Patient Stated Goal: Go to the bathroom OT Goal Formulation: With patient Time For Goal Achievement: 01/24/21 Potential to Achieve Goals: Good  OT Frequency: Min 2X/week   Barriers to D/C: Decreased caregiver support          Co-evaluation   Reason for Co-Treatment: For patient/therapist safety PT goals addressed during session: Mobility/safety with mobility OT goals addressed during session: ADL's and self-care       AM-PAC OT "6 Clicks" Daily Activity     Outcome Measure Help from another person eating meals?: A Little Help from another person taking care of personal grooming?: A Little Help from another person toileting, which includes using toliet, bedpan, or urinal?: A Lot Help from another person bathing (including washing, rinsing, drying)?: A Lot Help from another person to put on and taking off regular upper body clothing?: A Little Help from another person to put on and taking off regular lower body clothing?: A Lot 6 Click Score: 15   End of Session Equipment Utilized During Treatment: Gait belt;Rolling walker Nurse Communication: Other (comment) (patients need for increased care in next level of care.)  Activity Tolerance: Patient tolerated treatment well Patient left: in chair;with family/visitor present;with chair alarm set;with call bell/phone within reach  OT Visit Diagnosis: Unsteadiness on feet (R26.81);Muscle weakness (generalized) (M62.81)                Time: 2248-2500 OT Time Calculation (min): 27 min Charges:  OT General Charges $OT Visit: 1 Visit OT Evaluation $OT Eval Low Complexity: 1 Low  Leota Sauers, MS Acute Rehabilitation Department Office# [phone] Pager# [phone]   [place] 01/10/2021, 1:16 PM

## 2021-01-10 NOTE — Evaluation (Signed)
Physical Therapy Evaluation Patient Details Name: James Massey MRN: [id number] DOB: October 16, 1945 Today's Date: 01/10/2021   History of Present Illness  Pt admitted from home with AMS and dx with hypomagnesmia and hypocalcemia.  Pt with hx of anxiety and  kidney CA with mets (currently on chemo).  Pt is s/p R nephrectomy.  Clinical Impression  Pt admitted as above and presenting with functional mobility limitations 2* generalized weakness, ambulatory balance deficits, limited safety awareness related to AMS, and poor endurance.  Pt and spouse hope pt can progress to dc home with assist of family and part time CNA but dependent on acute stay progress, pt may need follow up rehab at SNF level to maximize IND and safety prior to dc home.    Follow Up Recommendations Home health PT;SNF (Dependent on acute stay progress and assist level at home)    Equipment Recommendations  None recommended by PT    Recommendations for Other Services       Precautions / Restrictions Precautions Precautions: Fall Restrictions Weight Bearing Restrictions: No      Mobility  Bed Mobility               General bed mobility comments: Pt up in chair on arrival and returned to chair at session end to eat lunch    Transfers Overall transfer level: Needs assistance Equipment used: Rolling walker (2 wheeled) Transfers: Sit to/from Stand Sit to Stand: Min assist;+2 physical assistance;+2 safety/equipment         General transfer comment: cues for LE management and use of UEs to self assist  Ambulation/Gait Ambulation/Gait assistance: Min assist;+2 safety/equipment (chair follow) Gait Distance (Feet): 28 Feet (and 15' into bathroom) Assistive device: Rolling walker (2 wheeled) Gait Pattern/deviations: Step-to pattern;Step-through pattern;Decreased step length - right;Decreased step length - left;Shuffle;Trunk flexed Gait velocity: decr   General Gait Details: cues for posture and position from  RW; Distance ltd by fatigue; chair follow for safety  Stairs            Wheelchair Mobility    Modified Rankin (Stroke Patients Only)       Balance Overall balance assessment: Needs assistance Sitting-balance support: Feet supported Sitting balance-Leahy Scale: Good     Standing balance support: No upper extremity supported Standing balance-Leahy Scale: Poor                               Pertinent Vitals/Pain Pain Assessment: No/denies pain    Home Living Family/patient expects to be discharged to:: Private residence Living Arrangements: Spouse/significant other Available Help at Discharge: Family Type of Home: House         Home Equipment: Gilford Rile - 2 wheels Additional Comments: 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    Prior Function Level of Independence: Needs assistance   Gait / Transfers Assistance Needed: Spouse reports pt uses RW but recently had advanced to ambulating unassisted with RW           Hand Dominance        Extremity/Trunk Assessment   Upper Extremity Assessment Upper Extremity Assessment: Defer to OT evaluation    Lower Extremity Assessment Lower Extremity Assessment: Generalized weakness    Cervical / Trunk Assessment Cervical / Trunk Assessment: Kyphotic  Communication   Communication: No difficulties  Cognition Arousal/Alertness: Awake/alert Behavior During Therapy: Flat affect;Anxious Overall Cognitive Status: Impaired/Different from baseline Area of Impairment: Orientation;Following commands;Safety/judgement;Memory;Problem solving  Orientation Level: Place;Situation     Following Commands: Follows one step commands inconsistently;Follows one step commands with increased time Safety/Judgement: Decreased awareness of safety   Problem Solving: Slow processing General Comments: Pt does not remember who wife is - wife reports he knew her yesterday      General Comments       Exercises     Assessment/Plan    PT Assessment Patient needs continued PT services  PT Problem List Decreased strength;Decreased activity tolerance;Decreased balance;Decreased mobility;Decreased cognition;Decreased knowledge of use of DME;Decreased safety awareness;Decreased knowledge of precautions       PT Treatment Interventions DME instruction;Gait training;Stair training;Functional mobility training;Therapeutic activities;Therapeutic exercise;Balance training;Patient/family education    PT Goals (Current goals can be found in the Care Plan section)  Acute Rehab PT Goals Patient Stated Goal: Go to the bathroom PT Goal Formulation: Patient unable to participate in goal setting Time For Goal Achievement: 01/24/21 Potential to Achieve Goals: Fair    Frequency Min 3X/week   Barriers to discharge        Co-evaluation PT/OT/SLP Co-Evaluation/Treatment: Yes Reason for Co-Treatment: For patient/therapist safety PT goals addressed during session: Mobility/safety with mobility OT goals addressed during session: ADL's and self-care       AM-PAC PT "6 Clicks" Mobility  Outcome Measure Help needed turning from your back to your side while in a flat bed without using bedrails?: A Little Help needed moving from lying on your back to sitting on the side of a flat bed without using bedrails?: A Little Help needed moving to and from a bed to a chair (including a wheelchair)?: A Little Help needed standing up from a chair using your arms (e.g., wheelchair or bedside chair)?: A Little Help needed to walk in hospital room?: A Little Help needed climbing 3-5 steps with a railing? : A Lot 6 Click Score: 17    End of Session Equipment Utilized During Treatment: Gait belt Activity Tolerance: Patient limited by fatigue Patient left: in chair;with call bell/phone within reach;with chair alarm set;with family/visitor present Nurse Communication: Mobility status PT Visit Diagnosis:  Unsteadiness on feet (R26.81);Muscle weakness (generalized) (M62.81);Difficulty in walking, not elsewhere classified (R26.2)    Time: 4259-5638 PT Time Calculation (min) (ACUTE ONLY): 29 min   Charges:   PT Evaluation $PT Eval Low Complexity: 1 Low          [facility] Pager [phone] Office [phone]   Tylin Force 01/10/2021, 12:36 PM

## 2021-01-11 DIAGNOSIS — G9341 Metabolic encephalopathy: Principal | ICD-10-CM

## 2021-01-11 LAB — BASIC METABOLIC PANEL
Anion gap: 8 (ref 5–15)
BUN: 11 mg/dL (ref 8–23)
CO2: 27 mmol/L (ref 22–32)
Calcium: 6.1 mg/dL — CL (ref 8.9–10.3)
Chloride: 102 mmol/L (ref 98–111)
Creatinine, Ser: 1.23 mg/dL (ref 0.61–1.24)
GFR, Estimated: >60 mL/min (ref >60)
Glucose, Bld: 94 mg/dL (ref 70–99)
Potassium: 3.4 mmol/L — ABNORMAL LOW (ref 3.5–5.1)
Sodium: 137 mmol/L (ref 135–145)

## 2021-01-11 LAB — MAGNESIUM: Magnesium: 1.5 mg/dL — ABNORMAL LOW (ref 1.7–2.4)

## 2021-01-11 LAB — ALBUMIN: Albumin: 2.7 g/dL — ABNORMAL LOW (ref 3.5–5.0)

## 2021-01-11 LAB — VITAMIN B12: Vitamin B-12: 190 pg/mL (ref 180–914)

## 2021-01-11 MED ORDER — POTASSIUM CHLORIDE CRYS ER 20 MEQ PO TBCR
40.0000 meq | EXTENDED_RELEASE_TABLET | Freq: Once | ORAL | Status: AC
  Administered 2021-01-11: 40 meq via ORAL
  Filled 2021-01-11: qty 2

## 2021-01-11 MED ORDER — MAGNESIUM SULFATE 2 GM/50ML IV SOLN
2.0000 g | INTRAVENOUS | Status: AC
  Administered 2021-01-11: 2 g via INTRAVENOUS
  Filled 2021-01-11: qty 50

## 2021-01-11 NOTE — TOC Initial Note (Signed)
Transition of Care [facility]) - Initial/Assessment Note    Patient Details  Name: James Massey MRN: [id number] Date of Birth: Nov 21, 1945  [facility]) CM/SW Contact:    Lennart Pall, LCSW Phone Number: 01/11/2021, 1:57 PM  Clinical Narrative:                 Pt lying in bed and staring at his arms lifted in the air.  Oriented to self only.  Completed intake assessment with pt's spouse, Adell Panek Sauer. Wife reports that pt's prior level of functioning was ambulatory with a rolling walker and that is her goal if he is to dc home.  She is concerned that he may require SNF for short term rehab but feels that he is not yet medically ready as work up to determine cause of change in mental status is still underway.  Wife does request that I reach out to [facility] SN[facility] to see if they could possibly consider if SNF is needed.  Wife notes she could also manage hiring private duty care in the home if that is the better option for pt.  Therapy evals have been completed but will await more sessions and monitor pt's improvement.  FL2 completed and sent to preferred facility in the area.  TOC will continue to follow.  Expected Discharge Plan: [place] (vs. Home with home health dependent on progress) Barriers to Discharge: Continued Medical Work up   Patient Goals and CMS Choice Patient states their goals for this hospitalization and ongoing recovery are:: unable to state      Expected Discharge Plan and Services Expected Discharge Plan: [place] (vs. Home with home health dependent on progress) In-house Referral: Clinical Social Work     Living arrangements for the past 2 months: Single Family Home                                      Prior Living Arrangements/Services Living arrangements for the past 2 months: Single Family Home Lives with:: Spouse Patient language and need for interpreter reviewed:: Yes Do you feel safe going back to the place where  you live?: Yes      Need for Family Participation in Patient Care: Yes (Comment) Care giver support system in place?: Yes (comment)   Criminal Activity/Legal Involvement Pertinent to Current Situation/Hospitalization: No - Comment as needed  Activities of Daily Living Home Assistive Devices/Equipment: Wheelchair ADL Screening (condition at time of admission) Patient's cognitive ability adequate to safely complete daily activities?: No Is the patient deaf or have difficulty hearing?: No Does the patient have difficulty seeing, even when wearing glasses/contacts?: No Does the patient have difficulty concentrating, remembering, or making decisions?: Yes Patient able to express need for assistance with ADLs?: Yes Does the patient have difficulty dressing or bathing?: Yes Independently performs ADLs?: No Communication: Independent Dressing (OT): Needs assistance Grooming: Needs assistance Feeding: Independent Bathing: Needs assistance Toileting: Independent with device (comment) In/Out Bed: Needs assistance Does the patient have difficulty walking or climbing stairs?: Yes Weakness of Legs: Both Weakness of Arms/Hands: None  Permission Sought/Granted Permission sought to share information with : Family Supports, Customer service manager Permission granted to share information with : Yes, Verbal Permission Granted  Share Information with NAME: Veleta Miners - [employer]     Permission granted to share info w Relationship: spouse  Permission granted to share info w Contact Information: [phone]  Emotional  Assessment Appearance:: Appears stated age Attitude/Demeanor/Rapport: Unable to Assess Affect (typically observed): Unable to Assess Orientation: : Oriented to Self Alcohol / Substance Use: Not Applicable Psych Involvement: No (comment)  Admission diagnosis:  Hypocalcemia [E83.51] Hypokalemia [E87.6] Hypomagnesemia [E83.42] Altered mental status [R41.82] Altered mental  status, unspecified altered mental status type [V91.66] Acute metabolic encephalopathy [M60.04] Patient Active Problem List   Diagnosis Date Noted   Altered mental status 59/97/7414   Acute metabolic encephalopathy 23/95/3202   Hypokalemia 01/09/2021   Hypomagnesemia 01/09/2021   Hypocalcemia 01/09/2021   Primary malignant neoplasm of kidney 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 to other site [facility]) 09/05/2020   Goals of care, counseling/discussion 09/05/2020   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 ([place]) 01/31/2020   History of right nephrectomy 08/13/2018   Renal mass, left 05/31/2018   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

## 2021-01-11 NOTE — Progress Notes (Signed)
Lab called about a critical lab value. Calcium was 6.1. Dr. Mal Misty was paged and notified of this value.

## 2021-01-11 NOTE — Progress Notes (Signed)
IP PROGRESS NOTE  Subjective:   Patient seen and examined personally.  He is known to me with advanced renal cell carcinoma admitted with altered mental status and confusion.  He is not able to provide much history at this time.  He does not report any specific complaints.  Objective:  Vital signs in last 24 hours: Temp:  [97.4 F (36.3 C)-98.6 F (37 C)] 97.4 F (36.3 C) (07/08 1014) Pulse Rate:  [82-101] 82 (07/08 1014) Resp:  [15-18] 15 (07/08 1014) BP: (120-133)/(74-87) 131/80 (07/08 1014) SpO2:  [91 %-100 %] 97 % (07/08 1014) Weight change:  Last BM Date: 01/10/21  Intake/Output from previous day: 07/07 0701 - 07/08 0700 In: 1098 [P.O.:600; IV Piggyback:498] Out: 300 [Urine:300] General: Awake but disoriented to person place. Head: Normocephalic atraumatic. Mouth: mucous membranes moist, pharynx normal without lesions Eyes: No scleral icterus.  Pupils are equal and round reactive to light. Resp: clear to auscultation bilaterally without rhonchi or wheezes or dullness to percussion. Cardio: regular rate and rhythm, S1, S2 normal, no murmur, click, rub or gallop GI: soft, non-tender; bowel sounds normal; no masses,  no organomegaly Musculoskeletal: No joint deformity or effusion. Neurological: No motor, sensory deficits.  Intact deep tendon reflexes. Skin: No rashes or lesions.    Lab Results: Recent Labs    01/09/21 1414  WBC 6.8  HGB 12.9*  HCT 36.4*  PLT 172    BMET Recent Labs    01/10/21 0412 01/11/21 0730  NA 140 137  K 3.1* 3.4*  CL 103 102  CO2 27 27  GLUCOSE 100* 94  BUN 10 11  CREATININE 1.10 1.23  CALCIUM 5.7* 6.1*    Studies/Results: CT Head Wo Contrast  Result Date: 01/09/2021 CLINICAL DATA:  Delirium, intermittent episodes of confusion over past 3 weeks. History renal cell carcinoma, former smoker EXAM: CT HEAD WITHOUT CONTRAST TECHNIQUE: Contiguous axial images were obtained from the base of the skull through the vertex without  intravenous contrast. Sagittal and coronal MPR images reconstructed from axial data set. COMPARISON:  10/23/2017 FINDINGS: Brain: Generalized atrophy. Normal ventricular morphology. No midline shift or mass effect. Scattered small vessel chronic ischemic changes of deep cerebral white matter. No intracranial hemorrhage, mass lesion or evidence of acute infarction. No extra-axial fluid collections. Vascular: Atherosclerotic calcification of internal carotid and LEFT vertebral arteries at skull base. No hyperdense vessels. Skull: Intact Sinuses/Orbits: Mucosal thickening BILATERAL maxillary sinuses. Other: N/A IMPRESSION: Atrophy with small vessel chronic ischemic changes of deep cerebral white matter. No acute intracranial abnormalities. Electronically Signed   By: Lavonia Dana M.D.   On: 01/09/2021 17:13    Medications: I have reviewed the patient's current medications.  Assessment/Plan:  75 year old with:  1.  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  He is currently on Cabometyx which has been withheld because of his delirium.  Is a disease status was updated today and discussed with his wife Hoyle Sauer.  At this time, I do not object to holding his cancer treatment given his changes in mental status.  It is unclear if Cabometyx is contributing but certainly stopping any possible offending medication would be reasonable.  I assured her that his cancer treatment should not suffer.  2.  Delirium: Appears to be multifactorial related to electrolyte imbalance in the setting of possible cognitive decline and dementia.  Management per the primary team.   3.  Follow-up will arrange outpatient follow-up upon his discharge for the management of his cancer.  I recommended continuing to hold Cabometyx till he is discharged and followed up.   25  minutes were dedicated to this visit.  50% of the time was face-to-face and the time was spent on reviewing laboratory data,  imaging studies, discussing differential diagnosis and answering questions regarding future plan with the patient and his wife via phone.    LOS: 2 days   [name] 01/11/2021, 12:18 PM

## 2021-01-11 NOTE — Progress Notes (Addendum)
Progress Note    James Massey  QIW:[id number] DOB: 1946/07/04  DOA: 01/09/2021 PCP: [name], MD      Brief Narrative:    Medical records reviewed and are as summarized below:  James Massey is a 75 y.o. 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  History was obtained from his wife at the bedside because the patient is confused.  According to his wife, patient has been eating poorly for the past few weeks.  Appetite has been poor and he has been very picky with food.  Patient admitted that he was depressed so his wife took him to a psychiatrist.  He saw Lesle Chris, NP, with psychiatric team as an outpatient.  Follow-up with neurologist was recommended for cognitive decline/dementia.   He is on calcium, magnesium and potassium supplements at home but he has not been taking them as prescribed.  Patient was noticed to have significant worsening confusion and inability to walk on the morning of admission so he was brought to the emergency room for further evaluation.  Normally he uses a walker at home and does some exercises with home health PT.   He was found to have severe hypomagnesemia, hypocalcemia and hypokalemia.  Cabometyx was held because of abnormal electrolytes.  Electrolytes were repleted orally and intravenously.  It is suspected that patient has underlying dementia.      Assessment/Plan:   Principal Problem:   Acute metabolic encephalopathy Active Problems:   Primary malignant neoplasm of kidney 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 to other site [facility])   Altered mental status   Hypokalemia   Hypomagnesemia   Hypocalcemia   Nutrition Problem: Inadequate oral intake Etiology: lethargy/confusion  Signs/Symptoms: meal completion < 50%   Body mass index is 27.45 kg/m.  Acute metabolic  encephalopathy with suspected underlying cognitive decline/dementia: He is still confused.  His wife suspects he may have dementia based on changes in cognition and personality in the past few months.  Continue supportive care.  Check vitamin B12 level and RPR levels.   Severe hypomagnesemia: Slowly improving.  Continue magnesium repletion with IV magnesium sulfate.  Hypocalcemia: Slowly improving.  Measured calcium was 6.1 but corrected calcium was 7.1.  Continue calcium carbonate.  Monitor calcium levels. Phosphorus and vitamin D levels were within normal limits.  Hypokalemia: Continue potassium repletion with IV potassium chloride.   Depression: Continue mirtazapine and sertraline   Poor oral intake, hypoalbuminemia: Encourage use of nutritional supplements.  Appreciate recommendations from dietitian.   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 Cabometyx can cause hypocalcemia, hypomagnesemia.  It is not clear whether Cabometyx is the cause of his hypocalcemia and hypomagnesemia.  Hold Cabometyx for now. Patient's wife has been advised to follow-up with oncologist as an outpatient regarding long-term treatment with Cabometyx.  Generalized weakness: Consulted PT and OT.  He may need SNF if weakness does not improve over time.   Diet Order             Diet regular Room service appropriate? Yes; Fluid consistency: Thin  Diet effective now                      Consultants: None  Procedures: None    Medications:    allopurinol  300 mg Oral QHS  atorvastatin  40 mg Oral QHS   calcium carbonate  1 tablet Oral TID WC   enoxaparin (LOVENOX) injection  40 mg Subcutaneous Q24H   feeding supplement  237 mL Oral BID BM   levothyroxine  25 mcg Oral Q0600   mirtazapine  15 mg Oral QHS   multivitamin with minerals  1 tablet Oral Daily   pantoprazole  40 mg Oral Daily   potassium chloride  40 mEq Oral Once   sertraline  25 mg Oral  Daily   Followed by   Derrill Memo ON 01/17/2021] sertraline  50 mg Oral Daily   Continuous Infusions:  magnesium sulfate bolus IVPB       Anti-infectives (From admission, onward)    None              Family Communication/Anticipated D/C date and plan/Code Status   DVT prophylaxis: enoxaparin (LOVENOX) injection 40 mg Start: 01/10/21 1000     Code Status: Full Code  Family Communication: None Disposition Plan:    Status is: Inpatient  Remains inpatient appropriate because:IV treatments appropriate due to intensity of illness or inability to take PO and Inpatient level of care appropriate due to severity of illness  Dispo: The patient is from: Home              Anticipated d/c is to: SNF              Patient currently is not medically stable to d/c.   Difficult to place patient No           Subjective:   Interval events noted.  She is confused and unable to provide any history.  His nurse was at the bedside.  Objective:    Vitals:   01/10/21 2056 01/11/21 0114 01/11/21 0627 01/11/21 1014  BP: 122/87 133/76 126/82 131/80  Pulse: (!) 101 96 91 82  Resp: 18 18 18 15   Temp: 98.3 F (36.8 C) (!) 97.5 F (36.4 C) 98.6 F (37 C) (!) 97.4 F (36.3 C)  TempSrc: Oral Oral Oral Oral  SpO2: 97% 91% 97% 97%  Weight:      Height:       No data found.   Intake/Output Summary (Last 24 hours) at 01/11/2021 1123 Last data filed at 01/11/2021 9211 Gross per 24 hour  Intake 995 ml  Output 100 ml  Net 895 ml   Filed Weights   01/09/21 1358 01/10/21 0533  Weight: 83.9 kg 81.9 kg    Exam:  GEN: NAD SKIN: Warm and dry EYES: No pallor or icterus ENT: MMM CV: RRR PULM: CTA B ABD: soft, ND, NT, +BS CNS: AAO x 1 (person only), non focal EXT: Bilateral leg edema 2+.  No tenderness or erythema.     Data Reviewed:   I have personally reviewed following labs and imaging studies:  Labs: Labs show the following:   Basic Metabolic Panel: Recent Labs   Lab 01/09/21 1414 01/10/21 0412 01/11/21 0730  NA 135 140 137  K 3.0* 3.1* 3.4*  CL 96* 103 102  CO2 26 27 27   GLUCOSE 84 100* 94  BUN 12 10 11   CREATININE 0.97 1.10 1.23  CALCIUM 5.3* 5.7* 6.1*  MG 0.6* 0.8* 1.5*  PHOS  --  3.6  3.6  --    GFR Estimated Creatinine Clearance: 50.2 mL/min (by C-G formula based on SCr of 1.23 mg/dL). Liver Function Tests: Recent Labs  Lab 01/09/21 1414 01/11/21 0730  AST 52*  --  ALT 35  --   ALKPHOS 71  --   BILITOT 0.9  --   PROT 6.6  --   ALBUMIN 3.2* 2.7*   No results for input(s): LIPASE, AMYLASE in the last 168 hours. No results for input(s): AMMONIA in the last 168 hours. Coagulation profile No results for input(s): INR, PROTIME in the last 168 hours.  CBC: Recent Labs  Lab 01/09/21 1414  WBC 6.8  HGB 12.9*  HCT 36.4*  MCV 106.1*  PLT 172   Cardiac Enzymes: No results for input(s): CKTOTAL, CKMB, CKMBINDEX, TROPONINI in the last 168 hours. BNP (last 3 results) No results for input(s): PROBNP in the last 8760 hours. CBG: Recent Labs  Lab 01/09/21 1420  GLUCAP 84   D-Dimer: No results for input(s): DDIMER in the last 72 hours. Hgb A1c: No results for input(s): HGBA1C in the last 72 hours. Lipid Profile: No results for input(s): CHOL, HDL, LDLCALC, TRIG, CHOLHDL, LDLDIRECT in the last 72 hours. Thyroid function studies: No results for input(s): TSH, T4TOTAL, T3FREE, THYROIDAB in the last 72 hours.  Invalid input(s): FREET3 Anemia work up: No results for input(s): VITAMINB12, FOLATE, FERRITIN, TIBC, IRON, RETICCTPCT in the last 72 hours. Sepsis Labs: Recent Labs  Lab 01/09/21 1414  WBC 6.8    Microbiology Recent Results (from the past 240 hour(s))  SARS CORONAVIRUS 2 (TAT 6-24 HRS) Nasopharyngeal Nasopharyngeal Swab     Status: None   Collection Time: 01/09/21  4:15 PM   Specimen: Nasopharyngeal Swab  Result Value Ref Range Status   SARS Coronavirus 2 NEGATIVE NEGATIVE Final    Comment:  (NOTE) SARS-CoV-2 target nucleic acids are NOT DETECTED.  The SARS-CoV-2 RNA is generally detectable in upper and lower respiratory specimens during the acute phase of infection. Negative results do not preclude SARS-CoV-2 infection, do not rule out co-infections with other pathogens, and should not be used as the sole basis for treatment or other patient management decisions. Negative results must be combined with clinical observations, patient history, and epidemiological information. The expected result is Negative.  Fact Sheet for Patients: SugarRoll.be  Fact Sheet for Healthcare Providers: https://www.woods-mathews.com/  This test is not yet approved or cleared by the [place] FDA and  has been authorized for detection and/or diagnosis of SARS-CoV-2 by FDA under an Emergency Use Authorization (EUA). This EUA will remain  in effect (meaning this test can be used) for the duration of the COVID-19 declaration under Se ction 564(b)(1) of the Act, 21 U.S.C. section 360bbb-3(b)(1), unless the authorization is terminated or revoked sooner.  Performed at [facility], [place] [address], [place], [place] [address]     Procedures and diagnostic studies:  CT Head Wo Contrast  Result Date: 01/09/2021 CLINICAL DATA:  Delirium, intermittent episodes of confusion over past 3 weeks. History renal cell carcinoma, former smoker EXAM: CT HEAD WITHOUT CONTRAST TECHNIQUE: Contiguous axial images were obtained from the base of the skull through the vertex without intravenous contrast. Sagittal and coronal MPR images reconstructed from axial data set. COMPARISON:  10/23/2017 FINDINGS: Brain: Generalized atrophy. Normal ventricular morphology. No midline shift or mass effect. Scattered small vessel chronic ischemic changes of deep cerebral white matter. No intracranial hemorrhage, mass lesion or evidence of acute infarction. No extra-axial fluid  collections. Vascular: Atherosclerotic calcification of internal carotid and LEFT vertebral arteries at skull base. No hyperdense vessels. Skull: Intact Sinuses/Orbits: Mucosal thickening BILATERAL maxillary sinuses. Other: N/A IMPRESSION: Atrophy with small vessel chronic ischemic changes of deep cerebral white  matter. No acute intracranial abnormalities. Electronically Signed   By: Lavonia Dana M.D.   On: 01/09/2021 17:13               LOS: 2 days   Nolin Grell  [facility]   Pager on [email or web address]. If 7PM-7AM, please contact night-coverage at [email or web address]     01/11/2021, 11:23 AM

## 2021-01-11 NOTE — NC FL2 (Signed)
[place] LEVEL OF CARE SCREENING TOOL     IDENTIFICATION  Patient Name: James Massey Birthdate: August 10, 1945 Sex: male Admission Date (Current Location): 01/09/2021  [facility] and [place] Number:  Herbalist and Address:  [facility],  [place], [place]      Provider Number: [id number]  Attending Physician Name and Address:  Jennye Boroughs, MD  Relative Name and Phone Number:  wife,  [place] @ [phone]    Current Level of Care: Hospital Recommended Level of Care: [place] Prior Approval Number:    Date Approved/Denied:   PASRR Number: [id number]  Discharge Plan: SNF    Current Diagnoses: Patient Active Problem List   Diagnosis Date Noted   Altered mental status 52/84/1324   Acute metabolic encephalopathy 40/04/2724   Hypokalemia 01/09/2021   Hypomagnesemia 01/09/2021   Hypocalcemia 01/09/2021   Primary malignant neoplasm of kidney 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 to other site [facility]) 09/05/2020   Goals of care, counseling/discussion 09/05/2020   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 ([place]) 01/31/2020   History of right nephrectomy 08/13/2018   Renal mass, left 05/31/2018   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   Barrett's esophagus 08/30/2015   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    Orientation RESPIRATION BLADDER Height & Weight     Self  Normal External catheter Weight: 180 lb 8.9 oz (81.9 kg) Height:  5\' 8"  (172.7 cm)  BEHAVIORAL SYMPTOMS/MOOD NEUROLOGICAL BOWEL NUTRITION STATUS      Incontinent    AMBULATORY STATUS COMMUNICATION OF NEEDS Skin   Extensive Assist Verbally Normal                       Personal Care Assistance Level of Assistance  Bathing, Feeding, Dressing Bathing Assistance: Limited assistance Feeding assistance: Limited assistance Dressing Assistance: Limited assistance     Functional Limitations Info             [place]  PT (By licensed  PT), OT (By licensed OT)     PT Frequency: 5x/wk OT Frequency: 5x/wk            Contractures Contractures Info: Not present    Additional Factors Info  Code Status, Allergies Code Status Info: Full Allergies Info: Bee Venom Not Specified Swelling   Budesonide-formoterol Fumarate Not Specified Other (See Comments) RESPIRATORY ISSUES  Poison Oak Extract Not Specified  Poison oak and ivy  Clindamycin/lincomycin           Current Medications (01/11/2021):  This is the current hospital active medication list Current Facility-Administered Medications  Medication Dose Route Frequency Provider Last Rate Last Admin   acetaminophen (TYLENOL) tablet 650 mg  650 mg Oral Q6H PRN Jennye Boroughs, MD       Or   acetaminophen (TYLENOL) suppository 650 mg  650 mg Rectal Q6H PRN Jennye Boroughs, MD       allopurinol (ZYLOPRIM) tablet 300 mg  300 mg Oral QHS Jennye Boroughs, MD   300 mg at 01/10/21 2059   atorvastatin (LIPITOR) tablet 40 mg  40 mg Oral QHS Jennye Boroughs, MD   40 mg at 01/10/21 2059   calcium carbonate (OS-CAL - dosed in mg of elemental calcium) tablet 500 mg of elemental calcium  1 tablet Oral TID WC Jennye Boroughs, MD   500 mg of elemental calcium at 01/11/21 0739   enoxaparin (LOVENOX) injection 40 mg  40 mg Subcutaneous Q24H Jennye Boroughs, MD   40 mg at 01/11/21  1028   feeding supplement (ENSURE ENLIVE / ENSURE PLUS) liquid 237 mL  237 mL Oral BID BM Jennye Boroughs, MD   237 mL at 01/10/21 1402   haloperidol lactate (HALDOL) injection 2 mg  2 mg Intramuscular Q6H PRN Jennye Boroughs, MD       levothyroxine (SYNTHROID) tablet 25 mcg  25 mcg Oral Q0600 Jennye Boroughs, MD   25 mcg at 01/11/21 0622   mirtazapine (REMERON) tablet 15 mg  15 mg Oral QHS Jennye Boroughs, MD   15 mg at 01/10/21 2059   multivitamin with minerals tablet 1 tablet  1 tablet Oral Daily Jennye Boroughs, MD   1 tablet at 01/11/21 1028   ondansetron (ZOFRAN) tablet 4 mg  4 mg Oral Q6H PRN Jennye Boroughs, MD       Or    ondansetron (ZOFRAN) injection 4 mg  4 mg Intravenous Q6H PRN Jennye Boroughs, MD       pantoprazole (PROTONIX) EC tablet 40 mg  40 mg Oral Daily Jennye Boroughs, MD   40 mg at 01/11/21 1028   potassium chloride SA (KLOR-CON) CR tablet 40 mEq  40 mEq Oral Once Jennye Boroughs, MD       senna-docusate (Senokot-S) tablet 1 tablet  1 tablet Oral QHS PRN Jennye Boroughs, MD       sertraline (ZOLOFT) tablet 25 mg  25 mg Oral Daily Jennye Boroughs, MD   25 mg at 01/11/21 1028   Followed by   Derrill Memo ON 01/17/2021] sertraline (ZOLOFT) tablet 50 mg  50 mg Oral Daily Jennye Boroughs, MD       simethicone [facility]) chewable tablet 160 mg  160 mg Oral TID PRN Jennye Boroughs, MD       traMADol Veat[facility]) tablet 50 mg  50 mg Oral Q6H PRN Jennye Boroughs, MD   50 mg at 01/09/21 2238     Discharge Medications: Please see discharge summary for a list of discharge medications.  Relevant Imaging Results:  Relevant Lab Results:   Additional Information SS# [id number]  Lennart Pall, LCSW

## 2021-01-12 ENCOUNTER — Encounter (HOSPITAL_COMMUNITY): Payer: Self-pay | Admitting: Internal Medicine

## 2021-01-12 LAB — MAGNESIUM: Magnesium: 2 mg/dL (ref 1.7–2.4)

## 2021-01-12 LAB — BASIC METABOLIC PANEL
Anion gap: 7 (ref 5–15)
BUN: 11 mg/dL (ref 8–23)
CO2: 27 mmol/L (ref 22–32)
Calcium: 7.4 mg/dL — ABNORMAL LOW (ref 8.9–10.3)
Chloride: 104 mmol/L (ref 98–111)
Creatinine, Ser: 0.95 mg/dL (ref 0.61–1.24)
GFR, Estimated: >60 mL/min (ref >60)
Glucose, Bld: 107 mg/dL — ABNORMAL HIGH (ref 70–99)
Potassium: 3.8 mmol/L (ref 3.5–5.1)
Sodium: 138 mmol/L (ref 135–145)

## 2021-01-12 LAB — TSH: TSH: 9.412 u[IU]/mL — ABNORMAL HIGH (ref 0.350–4.500)

## 2021-01-12 LAB — RPR: RPR Ser Ql: NONREACTIVE

## 2021-01-12 MED ORDER — POTASSIUM CHLORIDE 20 MEQ PO PACK
40.0000 meq | PACK | Freq: Once | ORAL | Status: DC
  Filled 2021-01-12: qty 2

## 2021-01-12 MED ORDER — LOPERAMIDE HCL 1 MG/7.5ML PO SUSP
2.0000 mg | ORAL | Status: DC | PRN
  Administered 2021-01-12 – 2021-01-14 (×2): 2 mg via ORAL
  Filled 2021-01-12 (×3): qty 15

## 2021-01-12 MED ORDER — POTASSIUM CHLORIDE CRYS ER 20 MEQ PO TBCR
40.0000 meq | EXTENDED_RELEASE_TABLET | Freq: Once | ORAL | Status: DC
  Filled 2021-01-12: qty 2

## 2021-01-12 MED ORDER — MAGNESIUM OXIDE -MG SUPPLEMENT 400 (240 MG) MG PO TABS
400.0000 mg | ORAL_TABLET | Freq: Every day | ORAL | Status: DC
  Administered 2021-01-12 – 2021-01-16 (×5): 400 mg via ORAL
  Filled 2021-01-12 (×5): qty 1

## 2021-01-12 MED ORDER — SODIUM CHLORIDE 0.9 % IV SOLN: INTRAVENOUS | Status: AC

## 2021-01-12 NOTE — Progress Notes (Addendum)
PROGRESS NOTE  James Massey JAS:[id number] DOB: [birth year] DOA: 01/09/2021 PCP: James Portela, MD  HPI/Recap of past 53 hours:  75 year old male with advanced renal cancer renal cell carcinoma admitted for altered mental queen status and confusion.  Also possible dementia  Patient seen and examined at bedside he was sitting on the recliner eating breakfast.  He seemed a little confused but was still sitting quietly and eating breakfast occasionally he was missing putting his fork in his plate. Nursing informs provider that wife wanted to speak with me for me to call her.  I was going to do that but Later in the afternoon wife came to visit and nurse informed me that wife wanted to speak with provider and that she was worried and concerned about patient's mental status.  Returned to the reevaluate patient and spoke at length with wife. Patient's wife stated that on Tuesday he was well making good conversation and walking around with his walker, she stated she took him to see his psychiatrist on that Tuesday who diagnosed him with depression and anxiety and started him or prescribed him Zoloft but on Wednesday did not know where he was he was confused really badly was not understanding commands to follow commands but was still smiling so wife called the ambulance and was taken to the emergency room.  They state on Thursday when she came to visit patient was hallucinating occasionally and yesterday was also hallucinating and seeing bugs in his plate.  This morning wife states she spoke to patient on the phone and he had a strong voice but upon arrival this afternoon she says she found him in agitated and flaring and trashing and did not recognize her.  And he did not open his eyes for her.  Patient's potassium was low and had been replaced he got better today at 3.4 I ordered potassium by mouth which his wife stated to hold because potassium gives him diarrhea. Patient had MRI of the brain on March  May 3 which was normal on admission he had a CT scan which was also normal.  Wife is concerned that he may have lesion on his spine and wanted to see if there was any cancer in his spinal fluid that could cause him to have this altered mental status.  Advised that is unlikely as the CT scan and MRI did not show any lesions in the brain. The oncologist Dr. [name] had visited and agreed to stop his cancer medication and to follow-up as outpatient which his wife had already stopped.  Wife also stated that the oncologist Dr. 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  Patient was acting confused his wife more confused than usual.  He was also given Haldol for agitation.  Currently sleeping most likely from the effect of the Haldol. I Will hold off ordering an MRI of the brain and monitor his mental status overnight.  If there is any worsening and we can order MRI of the brain. As the suggestion of oncologist will continue to hold his cancer medication Cabometyx  Also will discontinue his Zoloft We will continue to monitor his electrolytes and replete as needed  2.  Mild hypokalemia has been repleted.  It was 3.4 today so have ordered p.o. potassium.  3.  Diarrhea:-Ordered Imodium for as needed use  4.  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  He was on Cabometyxcarbo metrics but it has been withheld because of delirium.  The oncologist is in agreement to hold his Cabometyx.  Code Status: Full  Severity of Illness: The  appropriate patient status for this patient is INPATIENT. Inpatient status is judged to be reasonable and necessary in order to provide the required intensity of service to ensure the patient's safety. The patient's presenting symptoms, physical exam findings, and initial radiographic and laboratory data in the context of their chronic comorbidities is felt to place them at high risk for further clinical deterioration. Furthermore, it is not anticipated that the patient will be medically stable for discharge from the hospital within 2 midnights of admission. The following factors support the patient status of inpatient.  Altered mental status   * I certify that at the point of admission it is my clinical judgment that the patient will require inpatient hospital care spanning beyond 2 midnights from the point of admission due to high intensity of service, high risk for further deterioration and high frequency of surveillance required.*   Family Communication: Wife Ms. James Massey please see details above  Disposition Plan: Possible rehab   Consultants: Oncology  Procedures: None  Antimicrobials: None  DVT prophylaxis: Lovenox   Objective: Vitals:   01/11/21 0627 01/11/21 1014 01/11/21 2048 01/12/21 0407  BP: 126/82 131/80 116/77 130/87  Pulse: 91 82 87 87  Resp: 18 15 19 17   Temp: 98.6 F (37 C) (!) 97.4 F (36.3 C) 97.7 F (36.5 C) 98.6 F (37 C)  TempSrc: Oral Oral Oral Oral  SpO2: 97% 97% 97% 97%  Weight:      Height:        Intake/Output Summary (Last 24 hours) at 01/12/2021 0822 Last data filed at 01/12/2021 0549 Gross per 24 hour  Intake 470 ml  Output 0 ml  Net 470 ml   Filed Weights   01/09/21 1358 01/10/21 0533  Weight: 83.9 kg 81.9 kg   Body mass index is 27.45 kg/m.  Exam:  General: 75 y.o. year-old male well developed well nourished in no acute distress.  Alert and oriented x1 patient seemed confused and was making conversation but it did not make sense.   He was asking me what this object was.  And he said normal He told me to hold on he was tried to switch something on, And was kind of making motional like he was switching something on that was not there Cardiovascular: Regular rate and rhythm with no rubs or gallops.  No thyromegaly or JVD noted.   Respiratory: Clear to auscultation with no wheezes or rales. Good inspiratory effort. Abdomen: Soft nontender nondistended with normal bowel sounds x4 quadrants. Musculoskeletal: No lower extremity edema. 2/4 pulses in all 4 extremities. Skin: No ulcerative lesions noted or rashes, Psychiatry: Mood is appropriate for condition and setting  On reevaluation at the request of his wife: This afternoon patient was sitting in a recliner sleeping.  He had been given Haldol as ordered for agitation.:  Wife is concerned that she has never seen him sleeping with his mouth open: Chest was clear bilaterally, Heart rate was regular rate rhythm  no murmur His vital signs were stable  Addendum at 1855: Nurse paged because wife is still concerned that patient has not woken up.  And also does not want him to have Haldol anymore for agitation. PT SEEN AND REACCESSED . WIFE AT BEDSIDE .  Patient is now lying in bed flat no respiratory distress.  He responded to commands but eyes were still closed.  Asked him if he was hurting he nodded no asked him to squeeze my hand he did squeeze but strength was not very hard..  Asked him what his wife's name was he started to say something and said what.  We repeated the question what his wife's name was but he did not say he did attempted to say something but did not.  Eyes were closed all this time.  Lifted his left arm which cannot flop back slowly there lifted it and he was able to hold it off elevated. Vital signs are stable Ordered labs and I am going to order MRI We will discontinue Haldol per the request of his wife Patient had received Haldol for  agitation. Assessment: Altered mental status possibly sleepiness due to effect of Haldol which she seems to be coming out of.  Data Reviewed: CBC: Recent Labs  Lab 01/09/21 1414  WBC 6.8  HGB 12.9*  HCT 36.4*  MCV 106.1*  PLT 573   Basic Metabolic Panel: Recent Labs  Lab 01/09/21 1414 01/10/21 0412 01/11/21 0730  NA 135 140 137  K 3.0* 3.1* 3.4*  CL 96* 103 102  CO2 26 27 27   GLUCOSE 84 100* 94  BUN 12 10 11   CREATININE 0.97 1.10 1.23  CALCIUM 5.3* 5.7* 6.1*  MG 0.6* 0.8* 1.5*  PHOS  --  3.6  3.6  --    GFR: Estimated Creatinine Clearance: 50.2 mL/min (by C-G formula based on SCr of 1.23 mg/dL). Liver Function Tests: Recent Labs  Lab 01/09/21 1414 01/11/21 0730  AST 52*  --   ALT 35  --   ALKPHOS 71  --   BILITOT 0.9  --   PROT 6.6  --   ALBUMIN 3.2* 2.7*   No results for input(s): LIPASE, AMYLASE in the last 168 hours. No results for input(s): AMMONIA in the last 168 hours. Coagulation Profile: No results for input(s): INR, PROTIME in the last 168 hours. Cardiac Enzymes: No results for input(s): CKTOTAL, CKMB, CKMBINDEX, TROPONINI in the last 168 hours. BNP (last 3 results) No results for input(s): PROBNP in the last 8760 hours. HbA1C: No results for input(s): HGBA1C in the last 72 hours. CBG: Recent Labs  Lab 01/09/21 1420  GLUCAP 84   Lipid Profile: No results for input(s): CHOL, HDL, LDLCALC, TRIG, CHOLHDL, LDLDIRECT in the last 72 hours. Thyroid Function Tests: No results for input(s): TSH, T4TOTAL, FREET4, T3FREE, THYROIDAB in the last 72 hours. Anemia Panel: Recent Labs    01/11/21 0730  VITAMINB12 190   Urine analysis:    Component Value Date/Time   COLORURINE YELLOW 01/09/2021 2243   APPEARANCEUR CLEAR 01/09/2021 2243   LABSPEC 1.006 01/09/2021 2243   PHURINE 7.0 01/09/2021 2243   GLUCOSEU NEGATIVE 01/09/2021 2243   HGBUR NEGATIVE 01/09/2021 2243   BILIRUBINUR NEGATIVE 01/09/2021 2243   KETONESUR NEGATIVE 01/09/2021 2243    PROTEINUR NEGATIVE 01/09/2021 2243   NITRITE NEGATIVE 01/09/2021 2243   LEUKOCYTESUR NEGATIVE 01/09/2021 2243   Sepsis Labs: @LABRCNTIP (procalcitonin:4,lacticidven:4)  ) Recent Results (from the past 240 hour(s))  SARS CORONAVIRUS 2 (TAT 6-24 HRS)  Nasopharyngeal Nasopharyngeal Swab     Status: None   Collection Time: 01/09/21  4:15 PM   Specimen: Nasopharyngeal Swab  Result Value Ref Range Status   SARS Coronavirus 2 NEGATIVE NEGATIVE Final    Comment: (NOTE) SARS-CoV-2 target nucleic acids are NOT DETECTED.  The SARS-CoV-2 RNA is generally detectable in upper and lower respiratory specimens during the acute phase of infection. Negative results do not preclude SARS-CoV-2 infection, do not rule out co-infections with other pathogens, and should not be used as the sole basis for treatment or other patient management decisions. Negative results must be combined with clinical observations, patient history, and epidemiological information. The expected result is Negative.  Fact Sheet for Patients: SugarRoll.be  Fact Sheet for Healthcare Providers: https://www.woods-mathews.com/  This test is not yet approved or cleared by the [place] FDA and  has been authorized for detection and/or diagnosis of SARS-CoV-2 by FDA under an Emergency Use Authorization (EUA). This EUA will remain  in effect (meaning this test can be used) for the duration of the COVID-19 declaration under Se ction 564(b)(1) of the Act, 21 U.S.C. section 360bbb-3(b)(1), unless the authorization is terminated or revoked sooner.  Performed at [facility], [place] [address], [place], [place] [address]       Studies: No results found.  Scheduled Meds:  allopurinol  300 mg Oral QHS   atorvastatin  40 mg Oral QHS   calcium carbonate  1 tablet Oral TID WC   enoxaparin (LOVENOX) injection  40 mg Subcutaneous Q24H   feeding supplement  237 mL Oral BID BM    levothyroxine  25 mcg Oral Q0600   mirtazapine  15 mg Oral QHS   multivitamin with minerals  1 tablet Oral Daily   pantoprazole  40 mg Oral Daily   sertraline  25 mg Oral Daily   Followed by   Derrill Memo ON 01/17/2021] sertraline  50 mg Oral Daily    Continuous Infusions:   LOS: 3 days     Cristal Deer, MD [facility]  To reach me or the doctor on call, go to: [email or web address] Password [email or web address]  01/12/2021, 8:22 AM

## 2021-01-12 NOTE — Progress Notes (Signed)
Dr. Kyung Bacca, MD has seen and assessed the patient during the 5 pm/6 pm hour and has been in much contact with the patient's wife today after I reached out numerous times today and discussed the patient's wife's concerns via secure chat. Dr. Kyung Bacca, MD was paged about the patient's wife's concerns and is aware of the wife's concerns about the patient's mental status. Dr. Kyung Bacca, MD ordered a STAT MRI. Dr. Kyung Bacca, MD was also made aware that patient's wife refuses for patient to have Haldol. Also made Guadlupe Spanish, Charge RN aware of what has been going on with the patient. She talked to patient's wife and provided reassurance.           Received notification from the lab that during the patient's lab draw, when he was stuck with the needle, patient did not blink, flinch, or jump. Pamala Hurry, Rapid Response RN and Lovey Newcomer, NP ([facility] Floor Coverage) were notified at shift change about the patient's lack of response to the patient's lab draw and about the patient's YELLOW MEWS that is coming from his RR of 12 and response to voice (level of consciousness wise). Patient squeezed my hands and had a tight grip/responded to those commands.        Barbara, Rapid Response RN stated that she was giving report to the night shift Rapid response nurse and that she would let her know to come assess the patient. Pamala Hurry, Rapid Response RN stated to call MRI to see if they can come do the MRI ASAP (per Dr. Kyung Bacca, MD order). Attempted to call the MRI number and no response. Lovey Newcomer, NP has been made aware of the patient's yellow MEWS. Informed Lovey Newcomer, NP about the patient's wife's concerns about poor oral intake. No new orders at this time. Lovey Newcomer, NP stated that the patient will be monitored over night.      01/12/21 1840  Assess: MEWS Score  Temp 98 F (36.7 C)  BP (!) 130/95  Pulse Rate 64  Resp 12  Level of Consciousness Responds to Voice  SpO2 100 %  O2 Device Room Air  Assess: MEWS Score  MEWS Temp 0   MEWS Systolic 0  MEWS Pulse 0  MEWS RR 1  MEWS LOC 1  MEWS Score 2  MEWS Score Color Yellow  Assess: if the MEWS score is Yellow or Red  Were vital signs taken at a resting state? Yes  Focused Assessment Change from prior assessment (see assessment flowsheet)  Does the patient meet 2 or more of the SIRS criteria? No  MEWS guidelines implemented *See Row Information* Yes (YELLOW MEWS guidelines implemented)  Treat  MEWS Interventions Other (Comment) (Paged Dr. Kyung Bacca and called Rapid Response)  Pain Scale 0-10  Pain Score 0  Neuro symptoms relieved by Rest  Take Vital Signs  Increase Vital Sign Frequency  Yellow: Q 2hr X 2 then Q 4hr X 2, if remains yellow, continue Q 4hrs  Escalate  MEWS: Escalate Yellow: discuss with charge nurse/RN and consider discussing with provider and RRT  Notify: Charge Nurse/RN  Name of Charge Nurse/RN Notified Guadlupe Spanish, RN  Date Charge Nurse/RN Notified 01/12/21  Time Charge Nurse/RN Notified 1900  Notify: Provider  Provider Name/Title Lovey Newcomer, NP  Date Provider Notified 01/12/21  Time Provider Notified 1902  Notification Type Page  Notification Reason Change in status  Provider response No new orders Lovey Newcomer, NP stated we would monitor the patient overnight and wait for pt to have MRI.)  Date of Provider Response 01/12/21  Time of Provider Response 1940  Notify: Rapid Response  Name of Rapid Response RN Notified Pamala Hurry, RN  Date Rapid Response Notified 01/12/21  Time Rapid Response Notified 1901  Assess: SIRS CRITERIA  SIRS Temperature  0  SIRS Pulse 0  SIRS Respirations  0  SIRS WBC 0  SIRS Score Sum  0

## 2021-01-12 NOTE — Plan of Care (Signed)
  Problem: Activity: Goal: Risk for activity intolerance will decrease Outcome: Progressing   

## 2021-01-13 ENCOUNTER — Inpatient Hospital Stay (HOSPITAL_COMMUNITY): Payer: Medicare Other

## 2021-01-13 DIAGNOSIS — E039 Hypothyroidism, unspecified: Secondary | ICD-10-CM | POA: Diagnosis present

## 2021-01-13 LAB — BASIC METABOLIC PANEL
Anion gap: 8 (ref 5–15)
BUN: 11 mg/dL (ref 8–23)
CO2: 27 mmol/L (ref 22–32)
Calcium: 7.4 mg/dL — ABNORMAL LOW (ref 8.9–10.3)
Chloride: 104 mmol/L (ref 98–111)
Creatinine, Ser: 0.97 mg/dL (ref 0.61–1.24)
GFR, Estimated: >60 mL/min (ref >60)
Glucose, Bld: 95 mg/dL (ref 70–99)
Potassium: 3.9 mmol/L (ref 3.5–5.1)
Sodium: 139 mmol/L (ref 135–145)

## 2021-01-13 LAB — MAGNESIUM: Magnesium: 1.8 mg/dL (ref 1.7–2.4)

## 2021-01-13 IMAGING — MR MR HEAD WO/W CM
15 series · 48 of 48 positions shown · IV contrast (gadavist)
Comparison: MRI [DATE].

CLINICAL DATA: Delirium. Altered mental status. Active metastatic
disease.

EXAM:
MRI HEAD WITHOUT AND WITH CONTRAST
TECHNIQUE: Multiplanar, multiecho pulse sequences of the brain and surrounding
structures were obtained without and with intravenous contrast.
CONTRAST:  8mL GADAVIST GADOBUTROL 1 MMOL/ML IV SOLN

[Series 5: DWI · axial · 3.0mm · 1.36mm/px · z∈[-81,+83]mm · 7 of 112 slices shown (1 of 2)]
[im 1/112]
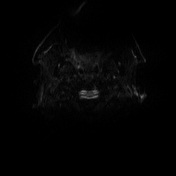
[im 19/112]
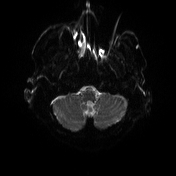
[im 38/112]
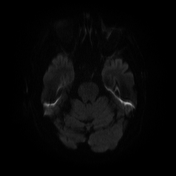
[im 56/112]
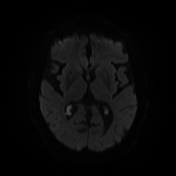
[im 75/112]
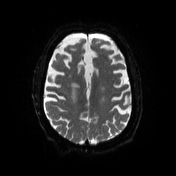
[im 93/112]
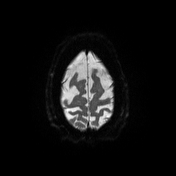
[im 112/112]
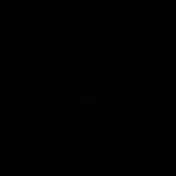

[Series 6: DWI · axial · 3.0mm · 1.36mm/px · z∈[-81,+83]mm · 3 of 56 slices shown (2 of 2)]
[im 1/56]
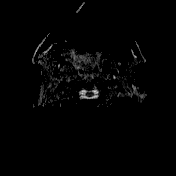
[im 28/56]
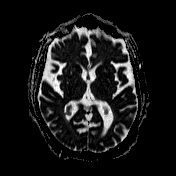
[im 56/56]
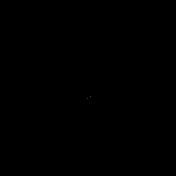

[Series 7: T1 · sagittal · 5.0mm · 0.75mm/px · 1 of 27 slices shown (1 of 4)]
[im 1/27]
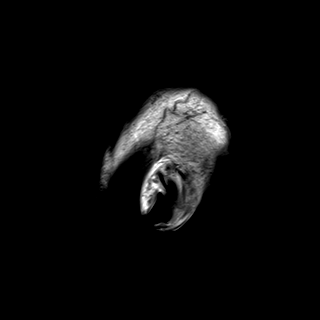

[Series 8: T2 · axial · 5.0mm · 0.62mm/px · 1 of 26 slices shown]
[im 1/26]
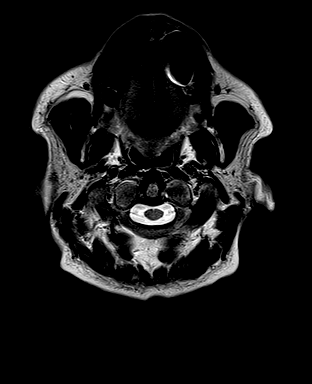

[Series 9: swi_images · axial · 3.0mm · 0.75mm/px · z∈[-82,+82]mm · 3 of 56 slices shown]
[im 1/56]
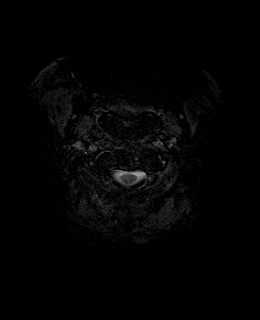
[im 28/56]
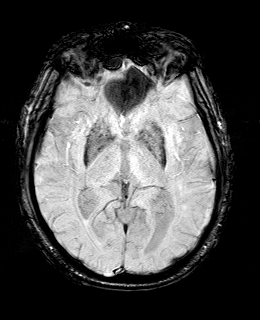
[im 56/56]
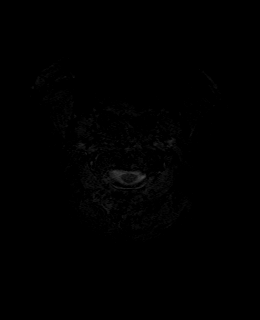

[Series 11: FLAIR · axial · 3.0mm · 0.75mm/px · z∈[-82,+82]mm · 3 of 56 slices shown]
[im 1/56]
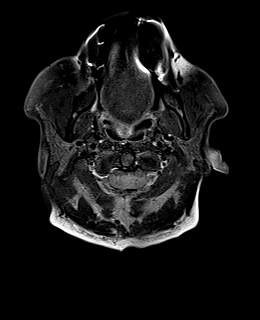
[im 28/56]
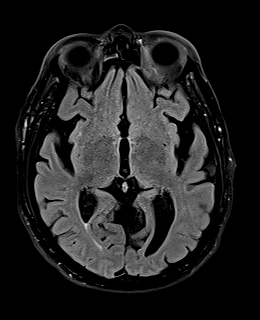
[im 56/56]
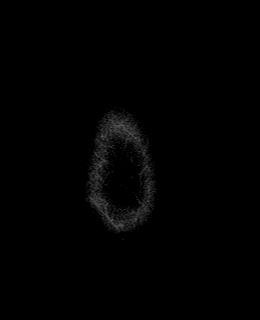

[Series 12: T1 · axial · 1.0mm · 0.94mm/px · z∈[-76,+81]mm · 8 of 160 slices shown (2 of 4)]
[im 1/160]
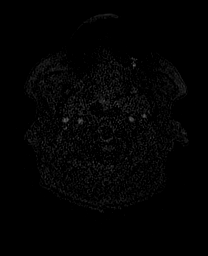
[im 23/160]
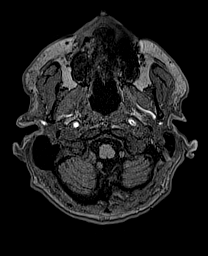
[im 46/160]
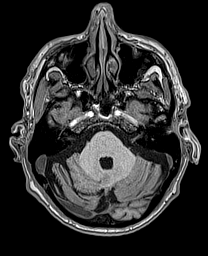
[im 69/160]
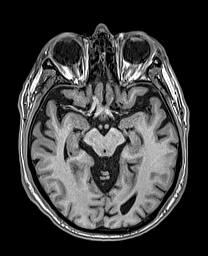
[im 91/160]
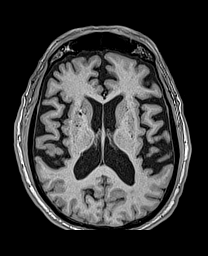
[im 114/160]
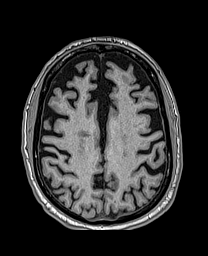
[im 137/160]
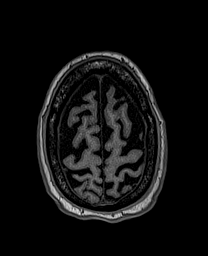
[im 160/160]
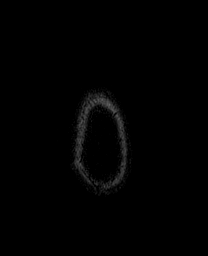

[Series 13: cor dwi_tracew · coronal · 5.0mm · 1.53mm/px · 3 of 62 slices shown]
[im 1/62]
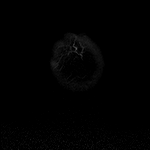
[im 31/62]
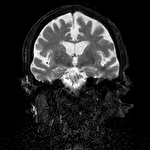
[im 62/62]
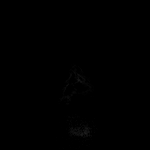

[Series 14: cor dwi_adc · coronal · 5.0mm · 1.53mm/px · 2 of 31 slices shown]
[im 1/31]
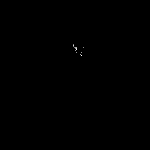
[im 31/31]
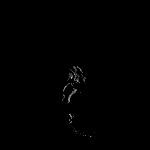

[Series 15: T2 post-contrast · coronal · 5.0mm · 0.57mm/px · 2 of 32 slices shown]
[im 1/32]
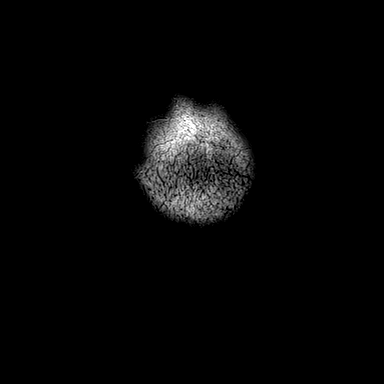
[im 32/32]
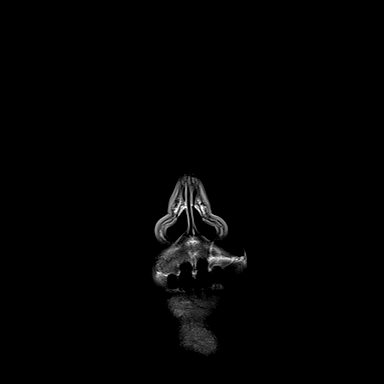

[Series 16: T1 post-contrast · axial · 1.0mm · 0.94mm/px · z∈[-76,+81]mm · 8 of 160 slices shown (1 of 3)]
[im 1/160]
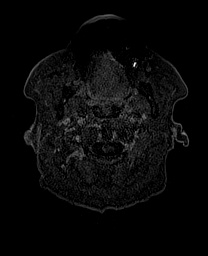
[im 23/160]
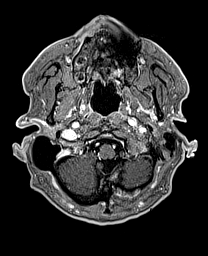
[im 46/160]
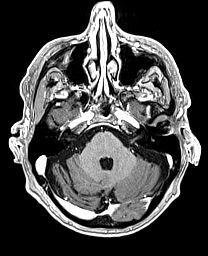
[im 69/160]
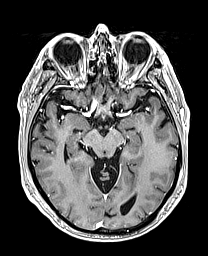
[im 91/160]
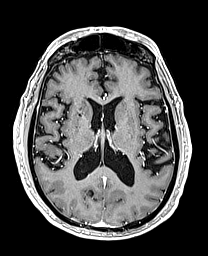
[im 114/160]
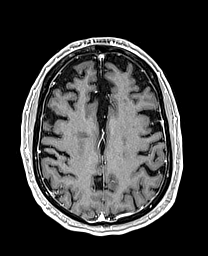
[im 137/160]
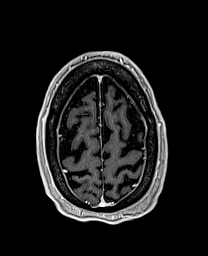
[im 160/160]
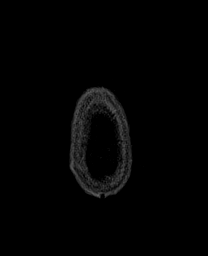

[Series 17: T1 · sagittal · 4.0mm · 0.94mm/px · 2 of 39 slices shown (3 of 4)]
[im 1/39]
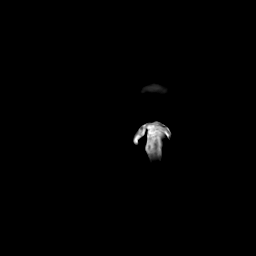
[im 39/39]
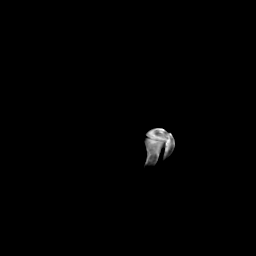

[Series 18: T1 · coronal · 4.0mm · 0.94mm/px · 2 of 45 slices shown (4 of 4)]
[im 1/45]
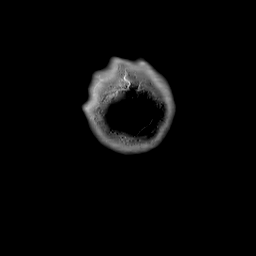
[im 45/45]
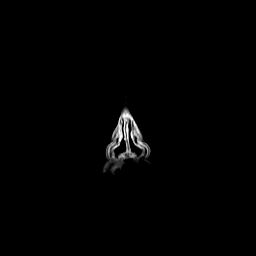

[Series 19: T1 post-contrast · coronal · 5.0mm · 0.43mm/px · 2 of 31 slices shown (2 of 3)]
[im 1/31]
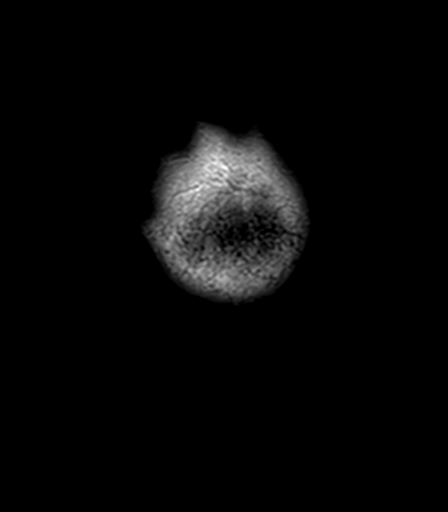
[im 31/31]
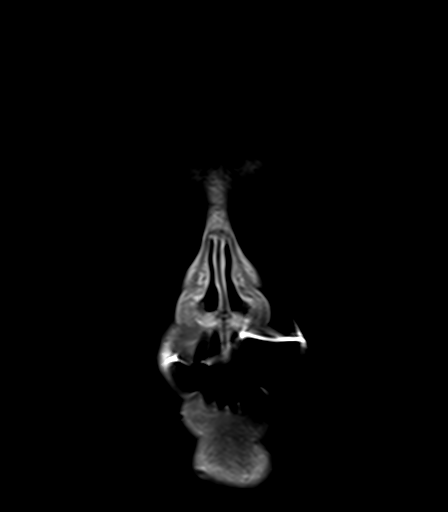

[Series 20: T1 post-contrast · sagittal · 5.0mm · 0.75mm/px · 1 of 27 slices shown (3 of 3)]
[im 1/27]
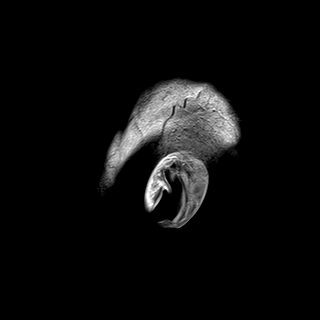

[48 of 48 positions shown; findings below may reference images not displayed]

FINDINGS: Brain: No acute infarction, hemorrhage, hydrocephalus, extra-axial
collection or mass lesion. Incidental bilateral cord plexus
xanthogranulomas. Similar mild for age scattered T2/FLAIR
hyperintensities within the white matter, nonspecific but
potentially related to chronic microvascular ischemic disease.
Moderate atrophy with ex vacuo ventricular dilation. No abnormal
enhancement.

Vascular: Major arterial flow voids are maintained at the skull
base.

Skull and upper cervical spine: Unchanged 7 mm focus of T2/FLAIR
hyperintensity in left parietal calvarium without solid enhancement,
favored benign.

Sinuses/Orbits: Moderate ethmoid air cell and maxillary sinus
mucosal thickening.

Other: No sizable mastoid effusions
IMPRESSION: 1. No evidence of acute intracranial abnormality or metastatic
disease.
2. Mild for age chronic microvascular ischemic disease and moderate
atrophy.
3. Moderate ethmoid and maxillary sinus mucosal thickening.

## 2021-01-13 MED ORDER — LEVOTHYROXINE SODIUM 50 MCG PO TABS
50.0000 ug | ORAL_TABLET | Freq: Every day | ORAL | Status: DC
  Administered 2021-01-14 – 2021-01-16 (×3): 50 ug via ORAL
  Filled 2021-01-13 (×3): qty 1

## 2021-01-13 MED ORDER — GADOBUTROL 1 MMOL/ML IV SOLN
8.0000 mL | Freq: Once | INTRAVENOUS | Status: AC | PRN
  Administered 2021-01-13: 8 mL via INTRAVENOUS

## 2021-01-13 MED ORDER — DEXTROSE IN LACTATED RINGERS 5 % IV SOLN: INTRAVENOUS | Status: DC

## 2021-01-13 NOTE — Progress Notes (Addendum)
PROGRESS NOTE  James Massey MCN:[id number] DOB: 07/16/1945 DOA: 01/09/2021 PCP: [name], MD  HPI/Recap of past 64 hours:  75 year old male with advanced renal cancer renal cell carcinoma admitted for altered mental queen status and confusion.  Also possible dementia  Patient seen and examined at bedside he was sitting on the recliner eating breakfast.  He seemed a little confused but was still sitting quietly and eating breakfast occasionally he was missing putting his fork in his plate. Nursing informs provider that wife wanted to speak with me for me to call her.  I was going to do that but Later in the afternoon wife came to visit and nurse informed me that wife wanted to speak with provider and that she was worried and concerned about patient's mental status.  Returned to the reevaluate patient and spoke at length with wife. Patient's wife stated that on Tuesday he was well making good conversation and walking around with his walker, she stated she took him to see his psychiatrist on that Tuesday who diagnosed him with depression and anxiety and started him or prescribed him Zoloft but on Wednesday did not know where he was he was confused really badly was not understanding commands to follow commands but was still smiling so wife called the ambulance and was taken to the emergency room.  They state on Thursday when she came to visit patient was hallucinating occasionally and yesterday was also hallucinating and seeing bugs in his plate.  This morning wife states she spoke to patient on the phone and he had a strong voice but upon arrival this afternoon she says she found him in agitated and flaring and trashing and did not recognize her.  And he did not open his eyes for her.  Patient's potassium was low and had been replaced he got better today at 3.4 I ordered potassium by mouth which his wife stated to hold because potassium gives him diarrhea. Patient had MRI of the brain on March  May 3 which was normal on admission he had a CT scan which was also normal.  Wife is concerned that he may have lesion on his spine and wanted to see if there was any cancer in his spinal fluid that could cause him to have this altered mental status.  Advised that is unlikely as the CT scan and MRI did not show any lesions in the brain. The oncologist Dr. Alen Blew had visited and agreed to stop his cancer medication and to follow-up as outpatient which his wife had already stopped.  Wife also stated that the oncologist Dr. Alen Blew had advised for patient not to be given the Zoloft due to interaction with chemotherapy  January 13, 2021: Patient seen and examined at bedside he is alert oriented to place and time and person.  He has turned around 185 degrees.  I spoke with his wife on the phone she said this is the most lucid and clear his brains for the past week.  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   Patient is very lucid and making sense and good conversation.  This may be a combination of effects.  It could be that the chemotherapy medication Cabometyx that was held has finally worn off and  and the insult has been stopped also the Zoloft was discontinued.  MRI of the brain that was done with contrast today January 13, 2021 was negative  CT scan on admission was negative also MRI done April was also negative for any metastasis in the brain.  Patient was acting confused his wife more confused than usual.  He was also given Haldol for agitation.  Currently sleeping most likely from the effect of the Haldol. I Will hold off ordering an MRI of the brain and monitor his mental status overnight.  If there is any worsening of his  mental status and we can order MRI of the brain. As the suggestion of oncologist will continue to hold his cancer medication Cabometyx Also will discontinue his Zoloft We will continue to monitor his electrolytes and replete as needed  Due to his continued and worsening altered mental status, MRI of the brain was ordered yesterday January 12, 2021 evening.  Is pending processing  2.  Mild hypokalemia has been repleted.  It was 3.4 today so have ordered p.o. potassium.  3.  Diarrhea:-Ordered Imodium for as needed use  4.  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  He was on Cabometyxcarbo metrics but it has been withheld because of delirium.  The oncologist is in agreement to hold his Cabometyx.  5.  Hypothyroidism.  Patient is on  levothyroxine 25 mcg daily.  His current TSH is 9.42.  On December 17, 2020 it was 13.1 and on Nov 19, 2020 it was 12.2 So going to increase his levothyroxine to 50 mcg  #6  History of gout.  Patient's wife wants to make sure that he gets his allopurinol  Code Status: Full  Severity of Illness: The appropriate patient status for this patient is INPATIENT. Inpatient status is judged to be reasonable and necessary in order to provide the required intensity of service to ensure the patient's safety. The patient's presenting symptoms, physical exam findings, and initial radiographic and laboratory data in the context of their chronic comorbidities is felt to place them at high risk for further clinical deterioration. Furthermore, it is not anticipated that the patient will be medically stable for discharge from the hospital within 2 midnights of admission. The following factors support the patient status of inpatient.  Altered mental status   * I certify that at the point of admission it is my clinical judgment that the patient will require inpatient hospital care spanning beyond 2 midnights from the point of admission due to high  intensity of service, high risk for further deterioration and high frequency of surveillance required.*   Family Communication: Wife . [name] in great details and length.  Please see details above Spoke with his wife on the phone this morning to let her know of the improvement change in condition. Called her again on the phone this evening to inform her that her husband CT scan of the brain was negative.  And that he is on the allopurinol.  She stated that the plan is still for him to go to rehab.  Disposition Plan: Possible rehab   Consultants: Oncology  Procedures: None  Antimicrobials: None  DVT prophylaxis: Lovenox   Objective: Vitals:   01/12/21 1840 01/12/21 2000 01/13/21 0415 01/13/21 0501  BP: (!) 130/95  132/69 (!) 143/83  Pulse: 64  70 84  Resp: 12 14 14 18   Temp: 98 F (36.7 C)   98.2 F (36.8 C)  TempSrc: Oral  Oral  SpO2: 100%  96% 99%  Weight:      Height:        Intake/Output Summary (Last 24 hours) at 01/13/2021 0829 Last data filed at 01/13/2021 0600 Gross per 24 hour  Intake 20 ml  Output 1000 ml  Net -980 ml    Filed Weights   01/09/21 1358 01/10/21 0533  Weight: 83.9 kg 81.9 kg   Body mass index is 27.45 kg/m.  Exam:  General: 75 y.o. year-old male well developed well nourished in no acute distress.  Alert and oriented x1 patient seemed confused and was making conversation but it did not make sense.  He was asking me what this object was.  And he said normal He told me to hold on he was tried to switch something on, And was kind of making motional like he was switching something on that was not there Cardiovascular: Regular rate and rhythm with no rubs or gallops.  No thyromegaly or JVD noted.   Respiratory: Clear to auscultation with no wheezes or rales. Good inspiratory effort. Abdomen: Soft nontender nondistended with normal bowel sounds x4 quadrants. Musculoskeletal: No lower extremity edema. 2/4 pulses in all 4 extremities. Skin:  No ulcerative lesions noted or rashes, Psychiatry: Mood is appropriate for condition and setting  On reevaluation at the request of his wife: This afternoon patient was sitting in a recliner sleeping.  He had been given Haldol as ordered for agitation.:  Wife is concerned that she has never seen him sleeping with his mouth open: Chest was clear bilaterally, Heart rate was regular rate rhythm no murmur His vital signs were stable  Addendum at 1855: Nurse paged because wife is still concerned that patient has not woken up.  And also does not want him to have Haldol anymore for agitation. PT SEEN AND REACCESSED . WIFE AT BEDSIDE .  Patient is now lying in bed flat no respiratory distress.  He responded to commands but eyes were still closed.  Asked him if he was hurting he nodded no asked him to squeeze my hand he did squeeze but strength was not very hard..  Asked him what his wife's name was he started to say something and said what.  We repeated the question what his wife's name was but he did not say he did attempted to say something but did not.  Eyes were closed all this time.  Lifted his left arm which cannot flop back slowly there lifted it and he was able to hold it off elevated. Vital signs are stable Ordered labs and I am going to order MRI We will discontinue Haldol per the request of his wife Patient had received Haldol for agitation. Assessment: Altered mental status possibly sleepiness due to effect of Haldol which she seems to be coming out of.  Data Reviewed: CBC: Recent Labs  Lab 01/09/21 1414  WBC 6.8  HGB 12.9*  HCT 36.4*  MCV 106.1*  PLT 664    Basic Metabolic Panel: Recent Labs  Lab 01/09/21 1414 01/10/21 0412 01/11/21 0730 01/12/21 1906 01/13/21 0413  NA 135 140 137 138 139  K 3.0* 3.1* 3.4* 3.8 3.9  CL 96* 103 102 104 104  CO2 26 27 27 27 27   GLUCOSE 84 100* 94 107* 95  BUN 12 10 11 11 11   CREATININE 0.97 1.10 1.23 0.95 0.97  CALCIUM 5.3* 5.7* 6.1*  7.4* 7.4*  MG 0.6* 0.8* 1.5* 2.0 1.8  PHOS  --  3.6  3.6  --   --   --  GFR: Estimated Creatinine Clearance: 63.7 mL/min (by C-G formula based on SCr of 0.97 mg/dL). Liver Function Tests: Recent Labs  Lab 01/09/21 1414 01/11/21 0730  AST 52*  --   ALT 35  --   ALKPHOS 71  --   BILITOT 0.9  --   PROT 6.6  --   ALBUMIN 3.2* 2.7*    No results for input(s): LIPASE, AMYLASE in the last 168 hours. No results for input(s): AMMONIA in the last 168 hours. Coagulation Profile: No results for input(s): INR, PROTIME in the last 168 hours. Cardiac Enzymes: No results for input(s): CKTOTAL, CKMB, CKMBINDEX, TROPONINI in the last 168 hours. BNP (last 3 results) No results for input(s): PROBNP in the last 8760 hours. HbA1C: No results for input(s): HGBA1C in the last 72 hours. CBG: Recent Labs  Lab 01/09/21 1420  GLUCAP 84    Lipid Profile: No results for input(s): CHOL, HDL, LDLCALC, TRIG, CHOLHDL, LDLDIRECT in the last 72 hours. Thyroid Function Tests: Recent Labs    01/12/21 1906  TSH 9.412*   Anemia Panel: Recent Labs    01/11/21 0730  VITAMINB12 190    Urine analysis:    Component Value Date/Time   COLORURINE YELLOW 01/09/2021 2243   APPEARANCEUR CLEAR 01/09/2021 2243   LABSPEC 1.006 01/09/2021 2243   PHURINE 7.0 01/09/2021 2243   GLUCOSEU NEGATIVE 01/09/2021 2243   HGBUR NEGATIVE 01/09/2021 2243   [place] NEGATIVE 01/09/2021 2243   KETONESUR NEGATIVE 01/09/2021 2243   PROTEINUR NEGATIVE 01/09/2021 2243   NITRITE NEGATIVE 01/09/2021 2243   LEUKOCYTESUR NEGATIVE 01/09/2021 2243   Sepsis Labs: @LABRCNTIP (procalcitonin:4,lacticidven:4)  ) Recent Results (from the past 240 hour(s))  SARS CORONAVIRUS 2 (TAT 6-24 HRS) Nasopharyngeal Nasopharyngeal Swab     Status: None   Collection Time: 01/09/21  4:15 PM   Specimen: Nasopharyngeal Swab  Result Value Ref Range Status   SARS Coronavirus 2 NEGATIVE NEGATIVE Final    Comment: (NOTE) SARS-CoV-2 target  nucleic acids are NOT DETECTED.  The SARS-CoV-2 RNA is generally detectable in upper and lower respiratory specimens during the acute phase of infection. Negative results do not preclude SARS-CoV-2 infection, do not rule out co-infections with other pathogens, and should not be used as the sole basis for treatment or other patient management decisions. Negative results must be combined with clinical observations, patient history, and epidemiological information. The expected result is Negative.  Fact Sheet for Patients: SugarRoll.be  Fact Sheet for Healthcare Providers: https://www.woods-mathews.com/  This test is not yet approved or cleared by the [place] FDA and  has been authorized for detection and/or diagnosis of SARS-CoV-2 by FDA under an Emergency Use Authorization (EUA). This EUA will remain  in effect (meaning this test can be used) for the duration of the COVID-19 declaration under Se ction 564(b)(1) of the Act, 21 U.S.C. section 360bbb-3(b)(1), unless the authorization is terminated or revoked sooner.  Performed at [facility], [place] [address] [place], [place] [address]       Studies: No results found.  Scheduled Meds:  allopurinol  300 mg Oral QHS   atorvastatin  40 mg Oral QHS   calcium carbonate  1 tablet Oral TID WC   enoxaparin (LOVENOX) injection  40 mg Subcutaneous Q24H   feeding supplement  237 mL Oral BID BM   levothyroxine  25 mcg Oral Q0600   magnesium oxide  400 mg Oral Daily   multivitamin with minerals  1 tablet Oral Daily   pantoprazole  40 mg Oral  Daily   potassium chloride  40 mEq Oral Once   potassium chloride  40 mEq Oral Once    Continuous Infusions:  sodium chloride       LOS: 4 days     Cristal Deer, MD [facility]  To reach me or the doctor on call, go to: [email or web address] Password [facility]  01/13/2021, 8:29 AM

## 2021-01-13 NOTE — TOC Progression Note (Signed)
Transition of Care [facility]) - Progression Note    Patient Details  Name: James Massey MRN: [id number] Date of Birth: 14-Jan-1946  Transition of Care [facility]) CM/SW Contact  Lennart Pall, [place] Phone Number: 01/13/2021, 3:41 PM  Clinical Narrative:    Met with pt's wife again today and she is now fully agreed on SNF as the dc plan.  We discussed need to widen search area and have done this.  Will ask oncoming TOC team to follow up with bed offers.   Expected Discharge Plan: [place] (vs. Home with home health dependent on progress) Barriers to Discharge: Continued Medical Work up  Expected Discharge Plan and Services Expected Discharge Plan: [place] (vs. Home with home health dependent on progress) In-house Referral: Clinical Social Work     Living arrangements for the past 2 months: Single Family Home                                       Social Determinants of Health (SDOH) Interventions    Readmission Risk Interventions No flowsheet data found.

## 2021-01-13 NOTE — Progress Notes (Signed)
[place] protocol started by dayshift RN at [place]. Unable to obtain vitals per protocol d/t pt refusing vitals, becoming combative when staff attempt to take vitals. Pt also refusing medications. Pt did allow staff to get vitals this AM, but still refusing medications.   At around [place], dayshift RN called rapid to assess pt d/t pt LOC, and family concern. When rapid arrived, family not at bedside and pt had woken up and set off bed alarm. Soft waist belt and sitter ordered d/t pt being combative and non compliant and attempting to climb out of bed. No sitter available this shift. Family does not want Haldol used, stating "I don't want him to sleep all day, he didn't eat anything today." Pt did eventually lay back down and somewhat follow commands, so soft waist belt was not applied during this Rns shift. Family was called to notify of the orders as well, and she verbalized understanding if we needed to use the belt.

## 2021-01-14 ENCOUNTER — Ambulatory Visit: Payer: BLUE CROSS/BLUE SHIELD | Admitting: Oncology

## 2021-01-14 ENCOUNTER — Other Ambulatory Visit: Payer: BLUE CROSS/BLUE SHIELD

## 2021-01-14 MED ORDER — FUROSEMIDE 10 MG/ML IJ SOLN
40.0000 mg | Freq: Once | INTRAMUSCULAR | Status: AC
  Administered 2021-01-14: 40 mg via INTRAVENOUS
  Filled 2021-01-14: qty 4

## 2021-01-14 MED ORDER — POTASSIUM CHLORIDE CRYS ER 20 MEQ PO TBCR
40.0000 meq | EXTENDED_RELEASE_TABLET | Freq: Once | ORAL | Status: AC
  Administered 2021-01-14: 40 meq via ORAL
  Filled 2021-01-14: qty 2

## 2021-01-14 NOTE — TOC Progression Note (Signed)
Transition of Care [facility]) - Progression Note    Patient Details  Name: James Massey MRN: [id number] Date of Birth: [birth year]  Transition [facility]) CM/SW [place], [place] Phone Number: 01/14/2021, 3:43 PM  Clinical Narrative:   Patient seen in follow up to PT recommendation of SNF/ supervision at home with [facility] PT.  Wife is adamant that she wants him at [facility], despite offering [place] and [employer] and going over Research scientist (physical sciences).  Contacted [facility] at [facility] who will re-review and let me know if they change their minds and make a bed offer. TOC will continue to follow during the course of hospitalization.     Expected Discharge Plan: [place] (vs. Home with home health dependent on progress) Barriers to Discharge: Other (must enter comment) (Waiting on wife choice)  Expected Discharge Plan and Services Expected Discharge Plan: [place] (vs. Home with home health dependent on progress) In-house Referral: Clinical Social Work     Living arrangements for the past 2 months: Single Family Home                                       Social Determinants of Health (SDOH) Interventions    Readmission Risk Interventions No flowsheet data found.

## 2021-01-14 NOTE — Progress Notes (Addendum)
Progress Note    James Massey  DJM:[id number] DOB: 1946-03-05  DOA: 01/09/2021 PCP: [name], MD      Brief Narrative:    Medical records reviewed and are as summarized below:  James Massey is a 75 y.o. 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  History was obtained from his wife at the bedside because the patient is confused.  According to his wife, patient has been eating poorly for the past few weeks.  Appetite has been poor and he has been very picky with food.  Patient admitted that he was depressed so his wife took him to a psychiatrist.  He saw Lesle Chris, NP, with psychiatric team as an outpatient.  Follow-up with neurologist was recommended for cognitive decline/dementia.   He is on calcium, magnesium and potassium supplements at home but he has not been taking them as prescribed.  Patient was noticed to have significant worsening confusion and inability to walk on the morning of admission so he was brought to the emergency room for further evaluation.  Normally he uses a walker at home and does some exercises with home health PT.   He was found to have severe hypomagnesemia, hypocalcemia and hypokalemia.  Cabometyx was held because of abnormal electrolytes.  Electrolytes were repleted orally and intravenously.  It is suspected that patient has underlying dementia.      Assessment/Plan:   Principal Problem:   Acute metabolic encephalopathy Active Problems:   Primary malignant neoplasm of kidney 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 to other site [facility])   Altered mental status   Hypokalemia   Hypomagnesemia   Hypocalcemia   Hypothyroidism (acquired)   Nutrition Problem: Inadequate oral intake Etiology: lethargy/confusion  Signs/Symptoms: meal completion < 50%   Body mass index is  27.45 kg/m.  Acute metabolic encephalopathy with suspected underlying cognitive decline/dementia: Acute confusional state has improved.  RPR was nonreactive.    Low normal Vitamin B12 (190).  Start vitamin B12 supplement.   Severe hypomagnesemia: Improved.  Continue magnesium supplement.  Hypocalcemia: Improving.  Continue calcium supplement.   Phosphorus and vitamin D levels were within normal limits.  Hypokalemia: Improved.  Continue potassium supplement.     Depression: Continue mirtazapine and sertraline   Poor oral intake, hypoalbuminemia: Encouraged adequate oral intake.  Peripheral edema: Discontinue IV fluids.  Give 1 dose of IV Lasix since electrolytes have improved and patient has been adequately hydrated.   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 Hold Cabometyx for now because of recent electrolyte abnormalities.  Oncologist in agreement with this plan.  Generalized weakness: Continue PT and OT.  Plan to discharge to SNF tomorrow.   Diet Order             Diet regular Room service appropriate? Yes; Fluid consistency: Thin  Diet effective now                      Consultants: None  Procedures: None    Medications:    allopurinol  300 mg Oral QHS   atorvastatin  40 mg Oral QHS   calcium carbonate  1 tablet Oral TID WC   enoxaparin (LOVENOX) injection  40 mg Subcutaneous Q24H   feeding supplement  237 mL Oral BID BM   furosemide  40  mg Intravenous Once   levothyroxine  50 mcg Oral Q0600   magnesium oxide  400 mg Oral Daily   multivitamin with minerals  1 tablet Oral Daily   pantoprazole  40 mg Oral Daily   potassium chloride  40 mEq Oral Once   Continuous Infusions:     Anti-infectives (From admission, onward)    None              Family Communication/Anticipated D/C date and plan/Code Status   DVT prophylaxis: enoxaparin (LOVENOX) injection 40 mg Start: 01/10/21 1000     Code  Status: Full Code  Family Communication: Wife Disposition Plan:    Status is: Inpatient  Remains inpatient appropriate because:IV treatments appropriate due to intensity of illness or inability to take PO and Inpatient level of care appropriate due to severity of illness  Dispo: The patient is from: Home              Anticipated d/c is to: SNF              Patient currently is not medically stable to d/c.   Difficult to place patient No           Subjective:   Interval events noted.  He feels much better today.  Confusion has improved.  Objective:    Vitals:   01/13/21 0501 01/13/21 2112 01/14/21 0550 01/14/21 1427  BP: (!) 143/83 (!) 154/88 108/75 131/82  Pulse: 84 90 89 88  Resp: 18 18 18 17   Temp: 98.2 F (36.8 C) 98.7 F (37.1 C) 98.8 F (37.1 C) 97.6 F (36.4 C)  TempSrc: Oral Oral Oral Oral  SpO2: 99% 99% 94% 98%  Weight:      Height:       No data found.   Intake/Output Summary (Last 24 hours) at 01/14/2021 1619 Last data filed at 01/14/2021 1400 Gross per 24 hour  Intake 2337.5 ml  Output 675 ml  Net 1662.5 ml   Filed Weights   01/09/21 1358 01/10/21 0533  Weight: 83.9 kg 81.9 kg    Exam:  GEN: NAD SKIN: Warm and dry EYES: No pallor or icterus ENT: MMM CV: RRR PULM: CTA B ABD: soft, ND, NT, +BS CNS: AAO x 3, non focal EXT: Bilateral leg edema.  No tenderness or erythema.       Data Reviewed:   I have personally reviewed following labs and imaging studies:  Labs: Labs show the following:   Basic Metabolic Panel: Recent Labs  Lab 01/09/21 1414 01/10/21 0412 01/11/21 0730 01/12/21 1906 01/13/21 0413  NA 135 140 137 138 139  K 3.0* 3.1* 3.4* 3.8 3.9  CL 96* 103 102 104 104  CO2 26 27 27 27 27   GLUCOSE 84 100* 94 107* 95  BUN 12 10 11 11 11   CREATININE 0.97 1.10 1.23 0.95 0.97  CALCIUM 5.3* 5.7* 6.1* 7.4* 7.4*  MG 0.6* 0.8* 1.5* 2.0 1.8  PHOS  --  3.6  3.6  --   --   --    GFR Estimated Creatinine Clearance:  63.7 mL/min (by C-G formula based on SCr of 0.97 mg/dL). Liver Function Tests: Recent Labs  Lab 01/09/21 1414 01/11/21 0730  AST 52*  --   ALT 35  --   ALKPHOS 71  --   BILITOT 0.9  --   PROT 6.6  --   ALBUMIN 3.2* 2.7*   No results for input(s): LIPASE, AMYLASE in the last 168 hours. No results for  input(s): AMMONIA in the last 168 hours. Coagulation profile No results for input(s): INR, PROTIME in the last 168 hours.  CBC: Recent Labs  Lab 01/09/21 1414  WBC 6.8  HGB 12.9*  HCT 36.4*  MCV 106.1*  PLT 172   Cardiac Enzymes: No results for input(s): CKTOTAL, CKMB, CKMBINDEX, TROPONINI in the last 168 hours. BNP (last 3 results) No results for input(s): PROBNP in the last 8760 hours. CBG: Recent Labs  Lab 01/09/21 1420  GLUCAP 84   D-Dimer: No results for input(s): DDIMER in the last 72 hours. Hgb A1c: No results for input(s): HGBA1C in the last 72 hours. Lipid Profile: No results for input(s): CHOL, HDL, LDLCALC, TRIG, CHOLHDL, LDLDIRECT in the last 72 hours. Thyroid function studies: Recent Labs    01/12/21 1906  TSH 9.412*   Anemia work up: No results for input(s): VITAMINB12, FOLATE, FERRITIN, TIBC, IRON, RETICCTPCT in the last 72 hours. Sepsis Labs: Recent Labs  Lab 01/09/21 1414  WBC 6.8    Microbiology Recent Results (from the past 240 hour(s))  SARS CORONAVIRUS 2 (TAT 6-24 HRS) Nasopharyngeal Nasopharyngeal Swab     Status: None   Collection Time: 01/09/21  4:15 PM   Specimen: Nasopharyngeal Swab  Result Value Ref Range Status   SARS Coronavirus 2 NEGATIVE NEGATIVE Final    Comment: (NOTE) SARS-CoV-2 target nucleic acids are NOT DETECTED.  The SARS-CoV-2 RNA is generally detectable in upper and lower respiratory specimens during the acute phase of infection. Negative results do not preclude SARS-CoV-2 infection, do not rule out co-infections with other pathogens, and should not be used as the sole basis for treatment or other patient  management decisions. Negative results must be combined with clinical observations, patient history, and epidemiological information. The expected result is Negative.  Fact Sheet for Patients: SugarRoll.be  Fact Sheet for Healthcare Providers: https://www.woods-mathews.com/  This test is not yet approved or cleared by the [place] FDA and  has been authorized for detection and/or diagnosis of SARS-CoV-2 by FDA under an Emergency Use Authorization (EUA). This EUA will remain  in effect (meaning this test can be used) for the duration of the COVID-19 declaration under Se ction 564(b)(1) of the Act, 21 U.S.C. section 360bbb-3(b)(1), unless the authorization is terminated or revoked sooner.  Performed at [facility], [place] [address], [place], [place] [address]     Procedures and diagnostic studies:  MR BRAIN W WO CONTRAST  Result Date: 01/13/2021 CLINICAL DATA:  Delirium. Altered mental status. Active metastatic disease. EXAM: MRI HEAD WITHOUT AND WITH CONTRAST TECHNIQUE: Multiplanar, multiecho pulse sequences of the brain and surrounding structures were obtained without and with intravenous contrast. CONTRAST:  60mL GADAVIST GADOBUTROL 1 MMOL/ML IV SOLN COMPARISON:  MRI Nov 05, 2020. FINDINGS: Brain: No acute infarction, hemorrhage, hydrocephalus, extra-axial collection or mass lesion. Incidental bilateral cord plexus xanthogranulomas. Similar mild for age scattered T2/FLAIR hyperintensities within the white matter, nonspecific but potentially related to chronic microvascular ischemic disease. Moderate atrophy with ex vacuo ventricular dilation. No abnormal enhancement. Vascular: Major arterial flow voids are maintained at the skull base. Skull and upper cervical spine: Unchanged 7 mm focus of T2/FLAIR hyperintensity in left parietal calvarium without solid enhancement, favored benign. Sinuses/Orbits: Moderate ethmoid air cell and maxillary  sinus mucosal thickening. Other: No sizable mastoid effusions IMPRESSION: 1. No evidence of acute intracranial abnormality or metastatic disease. 2. Mild for age chronic microvascular ischemic disease and moderate atrophy. 3. Moderate ethmoid and maxillary sinus mucosal thickening. Electronically Signed  By: Margaretha Sheffield MD   On: 01/13/2021 14:37               LOS: 5 days   Ziah Turvey  [facility]   Pager on [email or web address]. If 7PM-7AM, please contact night-coverage at [email or web address]     01/14/2021, 4:19 PM

## 2021-01-14 NOTE — Progress Notes (Signed)
Physical Therapy Treatment Patient Details Name: James Massey MRN: [id number] DOB: 1945-12-17 Today's Date: 01/14/2021    History of Present Illness Patient is a 75 year old male who was admitted from home with altered mental status. patient was found to have hypomagnesmia and hypocalcemia. PMH; anxiety and kidney CA  with mets ( Purrently on chemo)    PT Comments    Patient A&O today to self, time, place, and most of situation. He demonstrated improved ability to follow commands and cues while performing functional tasks/mobility. Ambulated to bathroom and completed toileting at Mod-I/sup level and min assist to donn pants in standing. Pt ambulated greater distance of ~180' in hallway with no breaks and min assist to steady with cues for safety. Educated pt on functional LE strengthening with Sit<>Stands. Anticipate if pt continues to progress he will be able to discharge home with HHPT services.    Follow Up Recommendations  Home health PT;SNF     Equipment Recommendations  None recommended by PT    Recommendations for Other Services       Precautions / Restrictions Precautions Precautions: Fall Restrictions Weight Bearing Restrictions: No    Mobility  Bed Mobility Overal bed mobility: Needs Assistance Bed Mobility: Supine to Sit     Supine to sit: Supervision;HOB elevated     General bed mobility comments: pt taking extra time to sit up EOB and using bed rail.    Transfers Overall transfer level: Needs assistance Equipment used: Rolling walker (2 wheeled) Transfers: Sit to/from Stand Sit to Stand: Min assist         General transfer comment: cues for hand placement for power up and assist to rise from EOB.  Ambulation/Gait Ambulation/Gait assistance: Min assist Gait Distance (Feet): 180 Feet Assistive device: Rolling walker (2 wheeled) Gait Pattern/deviations: Step-to pattern;Step-through pattern;Decreased step length - right;Decreased step length -  left;Shuffle;Trunk flexed Gait velocity: decr   General Gait Details: cues for proximity to RW and for posture. pt with flexed knees. pt required min assist to steady throughout. chair follow for safety   Stairs             Wheelchair Mobility    Modified Rankin (Stroke Patients Only)       Balance Overall balance assessment: Needs assistance Sitting-balance support: Feet supported Sitting balance-Leahy Scale: Good     Standing balance support: During functional activity;Bilateral upper extremity supported Standing balance-Leahy Scale: Poor Standing balance comment: reliant on RW                            Cognition Arousal/Alertness: Awake/alert Behavior During Therapy: WFL for tasks assessed/performed Overall Cognitive Status: Within Functional Limits for tasks assessed                                 General Comments: pt A&Ox4 and pleasant and eager to mobilize      Exercises Other Exercises Other Exercises: 6 reps sit<>stand with single UE support for power up. No use of UE's for slow controlled lowering.    General Comments        Pertinent Vitals/Pain Pain Assessment: No/denies pain    Home Living                      Prior Function            PT Goals (current goals can  now be found in the care plan section) Acute Rehab PT Goals Patient Stated Goal: Go to the bathroom PT Goal Formulation: Patient unable to participate in goal setting Time For Goal Achievement: 01/24/21 Potential to Achieve Goals: Fair Progress towards PT goals: Progressing toward goals    Frequency    Min 3X/week      PT Plan Current plan remains appropriate    Co-evaluation              AM-PAC PT "6 Clicks" Mobility   Outcome Measure  Help needed turning from your back to your side while in a flat bed without using bedrails?: A Little Help needed moving from lying on your back to sitting on the side of a flat bed without  using bedrails?: A Little Help needed moving to and from a bed to a chair (including a wheelchair)?: A Little Help needed standing up from a chair using your arms (e.g., wheelchair or bedside chair)?: A Little Help needed to walk in hospital room?: A Little Help needed climbing 3-5 steps with a railing? : A Lot 6 Click Score: 17    End of Session Equipment Utilized During Treatment: Gait belt Activity Tolerance: Patient tolerated treatment well Patient left: in chair;with call bell/phone within reach;with chair alarm set Nurse Communication: Mobility status PT Visit Diagnosis: Unsteadiness on feet (R26.81);Muscle weakness (generalized) (M62.81);Difficulty in walking, not elsewhere classified (R26.2)     Time: 8381-8403 PT Time Calculation (min) (ACUTE ONLY): 40 min  Charges:  $Gait Training: 8-22 mins $Therapeutic Exercise: 8-22 mins $Therapeutic Activity: 8-22 mins                     Verner Mould, DPT Acute Rehabilitation Services Office [phone] Pager [phone]    Jacques Navy 01/14/2021, 1:48 PM

## 2021-01-15 ENCOUNTER — Other Ambulatory Visit (HOSPITAL_COMMUNITY): Payer: Self-pay

## 2021-01-15 LAB — SARS CORONAVIRUS 2 (TAT 6-24 HRS): SARS Coronavirus 2: NEGATIVE

## 2021-01-15 MED ORDER — ENOXAPARIN SODIUM 40 MG/0.4ML IJ SOSY
40.0000 mg | PREFILLED_SYRINGE | Freq: Every day | INTRAMUSCULAR | Status: DC
  Administered 2021-01-16: 40 mg via SUBCUTANEOUS
  Filled 2021-01-15: qty 0.4

## 2021-01-15 MED ORDER — VITAMIN B-12 1000 MCG PO TABS
1000.0000 ug | ORAL_TABLET | Freq: Every day | ORAL | Status: DC
  Administered 2021-01-15 – 2021-01-16 (×2): 1000 ug via ORAL
  Filled 2021-01-15: qty 1

## 2021-01-15 NOTE — Progress Notes (Signed)
Occupational Therapy Treatment Patient Details Name: James Massey MRN: [id number] DOB: Jul 04, 1946 Today's Date: 01/15/2021    History of present illness Patient is a 75 year old male who was admitted from home with altered mental status. patient was found to have hypomagnesmia and hypocalcemia. PMH; anxiety and kidney CA  with mets ( Purrently on chemo)   OT comments  Patient in bed upon arrival, very agreeable to therapy. Requesting to use bathroom before lunch. Patient overall supervision level for functional ambulation with walker to bathroom, cue for safety to line up to toilet before sitting. Patient supervision level for clothing management, peri care and standing at sink side to wash hands. Patient supervision with no cues needed to transfer to recliner chair for lunch. Patient progressing quite well with acute OT goals, updated recommendation to home health if spouse is able to provide current supervision/assist levels. Acute OT to follow.   Follow Up Recommendations  Home health OT;Supervision/Assistance - 24 hour (if spouse can provide current level of care)    Equipment Recommendations  Tub/shower seat (if does not have)       Precautions / Restrictions Precautions Precautions: Fall Restrictions Weight Bearing Restrictions: No       Mobility Bed Mobility               General bed mobility comments: in chair    Transfers Overall transfer level: Needs assistance Equipment used: Rolling walker (2 wheeled) Transfers: Sit to/from Stand Sit to Stand: Supervision         General transfer comment: min cues for safety    Balance Overall balance assessment: Needs assistance Sitting-balance support: Feet supported Sitting balance-Leahy Scale: Good     Standing balance support: During functional activity Standing balance-Leahy Scale: Fair Standing balance comment: does not have to hold onto RW in standing to perform clothing management in bathroom                            ADL either performed or assessed with clinical judgement   ADL Overall ADL's : Needs assistance/impaired Eating/Feeding: Independent;Sitting   Grooming: Wash/dry hands;Supervision/safety;Standing                   Toilet Transfer: Supervision/safety;Ambulation;Regular Toilet;RW Toilet Transfer Details (indicate cue type and reason): no physical assistance to get on/off toilet, min cue for safety to align with toilet before sitting down Toileting- Clothing Manipulation and Hygiene: Supervision/safety;Sit to/from stand Toileting - Clothing Manipulation Details (indicate cue type and reason): patient able to complete peri care and clothing management without assistance     Functional mobility during ADLs: Supervision/safety;Rolling walker;Cueing for safety General ADL Comments: patient progressing well with ADL tasks at supervision level with min cues for safety      Cognition Arousal/Alertness: Awake/alert Behavior During Therapy: WFL for tasks assessed/performed Overall Cognitive Status: Within Functional Limits for tasks assessed                                 General Comments: patient pleasant and agreeable to work with OT                   Pertinent Vitals/ Pain       Pain Assessment: No/denies pain         Frequency  Min 2X/week        Progress Toward Goals  OT Goals(current goals can  now be found in the care plan section)  Progress towards OT goals: Progressing toward goals  Acute Rehab OT Goals Patient Stated Goal: Go to the bathroom OT Goal Formulation: With patient Time For Goal Achievement: 01/24/21 Potential to Achieve Goals: Good ADL Goals Pt Will Transfer to Toilet: with supervision Pt Will Perform Toileting - Clothing Manipulation and hygiene: with supervision Pt/caregiver will Perform Home Exercise Program: Both right and left upper extremity;With theraband;With Supervision  Plan Discharge  plan needs to be updated       AM-PAC OT "6 Clicks" Daily Activity     Outcome Measure   Help from another person eating meals?: None Help from another person taking care of personal grooming?: A Little Help from another person toileting, which includes using toliet, bedpan, or urinal?: A Little Help from another person bathing (including washing, rinsing, drying)?: A Little Help from another person to put on and taking off regular upper body clothing?: A Little Help from another person to put on and taking off regular lower body clothing?: A Little 6 Click Score: 19    End of Session Equipment Utilized During Treatment: Rolling walker  OT Visit Diagnosis: Unsteadiness on feet (R26.81);Muscle weakness (generalized) (M62.81)   Activity Tolerance Patient tolerated treatment well   Patient Left in chair;with call bell/phone within reach;with chair alarm set;with family/visitor present   Nurse Communication Mobility status        Time: 2263-3354 OT Time Calculation (min): 17 min  Charges: OT General Charges $OT Visit: 1 Visit OT Treatments $Self Care/Home Management : 8-22 mins  Delbert Phenix OT OT pager: [phone]   Rosemary Holms 01/15/2021, 2:13 PM

## 2021-01-15 NOTE — TOC Progression Note (Signed)
Transition of Care [facility]) - Progression Note    Patient Details  Name: James Massey MRN: [id number] Date of Birth: Jul 04, 1946  Transition of Care [facility] CM/SW Contact  Joaquin Courts, RN Phone Number: 01/15/2021, 2:07 PM  Clinical Narrative:    CM spoke with spouse regarding bed choice.  Spouse selects [facility] SN[facility], facility rep notified and bed will be available tomorrow 7/13.  Spouse and MD updated on bed availability for Wednesday 7/13.  Patient will need PTAR transport at discharge.   Expected Discharge Plan: [place] Barriers to Discharge: No SNF bed (bed available tomorrow 7/12)  Expected Discharge Plan and Services Expected Discharge Plan: [place] In-house Referral: Clinical Social Work   Post Acute Care Choice: [place] Living arrangements for the past 2 months: Single Family Home                                       Social Determinants of Health (SDOH) Interventions    Readmission Risk Interventions No flowsheet data found.

## 2021-01-15 NOTE — Progress Notes (Signed)
Progress Note    James Massey  CNO:[id number] DOB: 1946-06-24  DOA: 01/09/2021 PCP: [name], MD      Brief Narrative:    Medical records reviewed and are as summarized below:  James Massey is a 75 y.o. 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  History was obtained from his wife at the bedside because the patient is confused.  According to his wife, patient has been eating poorly for the past few weeks.  Appetite has been poor and he has been very picky with food.  Patient admitted that he was depressed so his wife took him to a psychiatrist.  He saw Lesle Chris, NP, with psychiatric team as an outpatient.  Follow-up with neurologist was recommended for cognitive decline/dementia.   He is on calcium, magnesium and potassium supplements at home but he has not been taking them as prescribed.  Patient was noticed to have significant worsening confusion and inability to walk on the morning of admission so he was brought to the emergency room for further evaluation.  Normally he uses a walker at home and does some exercises with home health PT.   He was found to have severe hypomagnesemia, hypocalcemia and hypokalemia.  Cabometyx was held because of abnormal electrolytes.  Electrolytes were repleted orally and intravenously.  It is suspected that patient has underlying dementia.      Assessment/Plan:   Principal Problem:   Acute metabolic encephalopathy Active Problems:   Primary malignant neoplasm of kidney 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 to other site [facility])   Altered mental status   Hypokalemia   Hypomagnesemia   Hypocalcemia   Hypothyroidism (acquired)   Nutrition Problem: Inadequate oral intake Etiology: lethargy/confusion  Signs/Symptoms: meal completion < 50%   Body mass index is  27.45 kg/m.  Acute metabolic encephalopathy with suspected underlying cognitive decline/dementia: Acute confusional state has improved.  RPR was nonreactive.    Low normal Vitamin B12 (190).  Start vitamin B12 supplement.   Severe hypomagnesemia: Improved.  Continue magnesium supplement.  Hypocalcemia: Improving.  Continue calcium supplement.   Phosphorus and vitamin D levels were within normal limits.  Hypokalemia: Improved.  Continue potassium supplement.     Depression: Continue mirtazapine and sertraline   Poor oral intake, hypoalbuminemia: Encouraged adequate oral intake.  Peripheral edema: Repeat dose of IV Lasix today.     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 Hold Cabometyx for now because of recent electrolyte abnormalities.  Oncologist in agreement with this plan.  Generalized weakness: Continue PT and OT.  Plan to discharge to SNF tomorrow.  Case discussed with case manager, Nancy Marus, RN.     Diet Order             Diet regular Room service appropriate? Yes; Fluid consistency: Thin  Diet effective now                      Consultants: None  Procedures: None    Medications:    allopurinol  300 mg Oral QHS   atorvastatin  40 mg Oral QHS   calcium carbonate  1 tablet Oral TID WC   enoxaparin (LOVENOX) injection  40 mg Subcutaneous Q24H   feeding supplement  237 mL Oral BID BM   levothyroxine  50 mcg  Oral Q0600   magnesium oxide  400 mg Oral Daily   multivitamin with minerals  1 tablet Oral Daily   pantoprazole  40 mg Oral Daily   Continuous Infusions:     Anti-infectives (From admission, onward)    None              Family Communication/Anticipated D/C date and plan/Code Status   DVT prophylaxis: enoxaparin (LOVENOX) injection 40 mg Start: 01/10/21 1000     Code Status: Full Code  Family Communication: Wife Disposition Plan:    Status is: Inpatient  Remains inpatient  appropriate because:IV treatments appropriate due to intensity of illness or inability to take PO and Inpatient level of care appropriate due to severity of illness  Dispo: The patient is from: Home              Anticipated d/c is to: SNF              Patient currently is medically stable to d/c.   Difficult to place patient No           Subjective:   No complaints.  He still has leg swelling.  Confusion is better.  Objective:    Vitals:   01/14/21 1427 01/14/21 2142 01/15/21 0551 01/15/21 1322  BP: 131/82 125/86 125/80 121/78  Pulse: 88 91 99 93  Resp: 17 17 17 17   Temp: 97.6 F (36.4 C) 98.3 F (36.8 C) 98.2 F (36.8 C) 97.6 F (36.4 C)  TempSrc: Oral Oral Oral Oral  SpO2: 98% 98% 95% 96%  Weight:      Height:       No data found.   Intake/Output Summary (Last 24 hours) at 01/15/2021 1405 Last data filed at 01/15/2021 1324 Gross per 24 hour  Intake 1180 ml  Output 1250 ml  Net -70 ml   Filed Weights   01/09/21 1358 01/10/21 0533  Weight: 83.9 kg 81.9 kg    Exam:  GEN: NAD SKIN: Warm and dry EYES: EOMI ENT: MMM CV: RRR PULM: CTA B ABD: soft, ND, NT, +BS CNS: AAO x 3, non focal EXT: B/l leg edema, no tenderness        Data Reviewed:   I have personally reviewed following labs and imaging studies:  Labs: Labs show the following:   Basic Metabolic Panel: Recent Labs  Lab 01/09/21 1414 01/10/21 0412 01/11/21 0730 01/12/21 1906 01/13/21 0413  NA 135 140 137 138 139  K 3.0* 3.1* 3.4* 3.8 3.9  CL 96* 103 102 104 104  CO2 26 27 27 27 27   GLUCOSE 84 100* 94 107* 95  BUN 12 10 11 11 11   CREATININE 0.97 1.10 1.23 0.95 0.97  CALCIUM 5.3* 5.7* 6.1* 7.4* 7.4*  MG 0.6* 0.8* 1.5* 2.0 1.8  PHOS  --  3.6  3.6  --   --   --    GFR Estimated Creatinine Clearance: 63.7 mL/min (by C-G formula based on SCr of 0.97 mg/dL). Liver Function Tests: Recent Labs  Lab 01/09/21 1414 01/11/21 0730  AST 52*  --   ALT 35  --   ALKPHOS 71  --    BILITOT 0.9  --   PROT 6.6  --   ALBUMIN 3.2* 2.7*   No results for input(s): LIPASE, AMYLASE in the last 168 hours. No results for input(s): AMMONIA in the last 168 hours. Coagulation profile No results for input(s): INR, PROTIME in the last 168 hours.  CBC: Recent Labs  Lab 01/09/21  1414  WBC 6.8  HGB 12.9*  HCT 36.4*  MCV 106.1*  PLT 172   Cardiac Enzymes: No results for input(s): CKTOTAL, CKMB, CKMBINDEX, TROPONINI in the last 168 hours. BNP (last 3 results) No results for input(s): PROBNP in the last 8760 hours. CBG: Recent Labs  Lab 01/09/21 1420  GLUCAP 84   D-Dimer: No results for input(s): DDIMER in the last 72 hours. Hgb A1c: No results for input(s): HGBA1C in the last 72 hours. Lipid Profile: No results for input(s): CHOL, HDL, LDLCALC, TRIG, CHOLHDL, LDLDIRECT in the last 72 hours. Thyroid function studies: Recent Labs    01/12/21 1906  TSH 9.412*   Anemia work up: No results for input(s): VITAMINB12, FOLATE, FERRITIN, TIBC, IRON, RETICCTPCT in the last 72 hours. Sepsis Labs: Recent Labs  Lab 01/09/21 1414  WBC 6.8    Microbiology Recent Results (from the past 240 hour(s))  SARS CORONAVIRUS 2 (TAT 6-24 HRS) Nasopharyngeal Nasopharyngeal Swab     Status: None   Collection Time: 01/09/21  4:15 PM   Specimen: Nasopharyngeal Swab  Result Value Ref Range Status   SARS Coronavirus 2 NEGATIVE NEGATIVE Final    Comment: (NOTE) SARS-CoV-2 target nucleic acids are NOT DETECTED.  The SARS-CoV-2 RNA is generally detectable in upper and lower respiratory specimens during the acute phase of infection. Negative results do not preclude SARS-CoV-2 infection, do not rule out co-infections with other pathogens, and should not be used as the sole basis for treatment or other patient management decisions. Negative results must be combined with clinical observations, patient history, and epidemiological information. The expected result is Negative.  Fact  Sheet for Patients: SugarRoll.be  Fact Sheet for Healthcare Providers: https://www.woods-mathews.com/  This test is not yet approved or cleared by the [place] FDA and  has been authorized for detection and/or diagnosis of SARS-CoV-2 by FDA under an Emergency Use Authorization (EUA). This EUA will remain  in effect (meaning this test can be used) for the duration of the COVID-19 declaration under Se ction 564(b)(1) of the Act, 21 U.S.C. section 360bbb-3(b)(1), unless the authorization is terminated or revoked sooner.  Performed at [facility], [place] [address] [place], [place] [address]   SARS CORONAVIRUS 2 (TAT 6-24 HRS) Nasopharyngeal Nasopharyngeal Swab     Status: None   Collection Time: 01/14/21  9:00 PM   Specimen: Nasopharyngeal Swab  Result Value Ref Range Status   SARS Coronavirus 2 NEGATIVE NEGATIVE Final    Comment: (NOTE) SARS-CoV-2 target nucleic acids are NOT DETECTED.  The SARS-CoV-2 RNA is generally detectable in upper and lower respiratory specimens during the acute phase of infection. Negative results do not preclude SARS-CoV-2 infection, do not rule out co-infections with other pathogens, and should not be used as the sole basis for treatment or other patient management decisions. Negative results must be combined with clinical observations, patient history, and epidemiological information. The expected result is Negative.  Fact Sheet for Patients: SugarRoll.be  Fact Sheet for Healthcare Providers: https://www.woods-mathews.com/  This test is not yet approved or cleared by the [place] FDA and  has been authorized for detection and/or diagnosis of SARS-CoV-2 by FDA under an Emergency Use Authorization (EUA). This EUA will remain  in effect (meaning this test can be used) for the duration of the COVID-19 declaration under Se ction 564(b)(1) of the Act, 21  U.S.C. section 360bbb-3(b)(1), unless the authorization is terminated or revoked sooner.  Performed at [facility], [place] [address], [place], [place]  [address]     Procedures and diagnostic studies:  MR BRAIN W WO CONTRAST  Result Date: 01/13/2021 CLINICAL DATA:  Delirium. Altered mental status. Active metastatic disease. EXAM: MRI HEAD WITHOUT AND WITH CONTRAST TECHNIQUE: Multiplanar, multiecho pulse sequences of the brain and surrounding structures were obtained without and with intravenous contrast. CONTRAST:  28mL GADAVIST GADOBUTROL 1 MMOL/ML IV SOLN COMPARISON:  MRI Nov 05, 2020. FINDINGS: Brain: No acute infarction, hemorrhage, hydrocephalus, extra-axial collection or mass lesion. Incidental bilateral cord plexus xanthogranulomas. Similar mild for age scattered T2/FLAIR hyperintensities within the white matter, nonspecific but potentially related to chronic microvascular ischemic disease. Moderate atrophy with ex vacuo ventricular dilation. No abnormal enhancement. Vascular: Major arterial flow voids are maintained at the skull base. Skull and upper cervical spine: Unchanged 7 mm focus of T2/FLAIR hyperintensity in left parietal calvarium without solid enhancement, favored benign. Sinuses/Orbits: Moderate ethmoid air cell and maxillary sinus mucosal thickening. Other: No sizable mastoid effusions IMPRESSION: 1. No evidence of acute intracranial abnormality or metastatic disease. 2. Mild for age chronic microvascular ischemic disease and moderate atrophy. 3. Moderate ethmoid and maxillary sinus mucosal thickening. Electronically Signed   By: Margaretha Sheffield MD   On: 01/13/2021 14:37               LOS: 6 days   [facility]   Pager on [email or web address]. If 7PM-7AM, please contact night-coverage at [email or web address]     01/15/2021, 2:05 PM

## 2021-01-15 NOTE — Care Management Important Message (Signed)
Important Message  Patient Details IM Letter given to the Patient. Name: [name] MRN: [id number] Date of Birth: 1945/11/27   Medicare Important Message Given:  Yes     Kerin Salen 01/15/2021, 11:30 AM

## 2021-01-16 ENCOUNTER — Telehealth: Payer: Self-pay | Admitting: Oncology

## 2021-01-16 DIAGNOSIS — E538 Deficiency of other specified B group vitamins: Secondary | ICD-10-CM

## 2021-01-16 DIAGNOSIS — E8809 Other disorders of plasma-protein metabolism, not elsewhere classified: Secondary | ICD-10-CM

## 2021-01-16 DIAGNOSIS — R531 Weakness: Secondary | ICD-10-CM

## 2021-01-16 DIAGNOSIS — F32A Depression, unspecified: Secondary | ICD-10-CM

## 2021-01-16 LAB — BASIC METABOLIC PANEL
Anion gap: 6 (ref 5–15)
BUN: 16 mg/dL (ref 8–23)
CO2: 28 mmol/L (ref 22–32)
Calcium: 7.8 mg/dL — ABNORMAL LOW (ref 8.9–10.3)
Chloride: 102 mmol/L (ref 98–111)
Creatinine, Ser: 1.09 mg/dL (ref 0.61–1.24)
GFR, Estimated: >60 mL/min (ref >60)
Glucose, Bld: 99 mg/dL (ref 70–99)
Potassium: 4.2 mmol/L (ref 3.5–5.1)
Sodium: 136 mmol/L (ref 135–145)

## 2021-01-16 LAB — MAGNESIUM: Magnesium: 1.3 mg/dL — ABNORMAL LOW (ref 1.7–2.4)

## 2021-01-16 MED ORDER — LEVOTHYROXINE SODIUM 50 MCG PO TABS: 50.0000 ug | ORAL_TABLET | Freq: Every day | ORAL | 2 refills | Status: AC

## 2021-01-16 MED ORDER — POTASSIUM CHLORIDE CRYS ER 10 MEQ PO TBCR: 10.0000 meq | EXTENDED_RELEASE_TABLET | Freq: Two times a day (BID) | ORAL | 2 refills | Status: DC

## 2021-01-16 MED ORDER — MAGNESIUM SULFATE 2 GM/50ML IV SOLN
2.0000 g | Freq: Once | INTRAVENOUS | Status: AC
  Administered 2021-01-16: 2 g via INTRAVENOUS
  Filled 2021-01-16: qty 50

## 2021-01-16 MED ORDER — MAGNESIUM OXIDE -MG SUPPLEMENT 400 (240 MG) MG PO TABS: 400.0000 mg | ORAL_TABLET | Freq: Two times a day (BID) | ORAL | 2 refills | Status: AC

## 2021-01-16 MED ORDER — TRAMADOL HCL 50 MG PO TABS: 50.0000 mg | ORAL_TABLET | Freq: Four times a day (QID) | ORAL | 0 refills | Status: DC | PRN

## 2021-01-16 MED ORDER — CYANOCOBALAMIN 1000 MCG PO TABS: 1000.0000 ug | ORAL_TABLET | Freq: Every day | ORAL | 2 refills | Status: DC

## 2021-01-16 NOTE — Progress Notes (Signed)
Physical Therapy Treatment Patient Details Name: James Massey MRN: [id number] DOB: 06/20/1946 Today's Date: 01/16/2021       PT Comments    Patient is making excellent progress with acute PT and ambulated ~300' with min guard/supervision and demonstrated improved use of RW for gait. He initiated functional LE strengthening and HEP handout provided. Acute PT will continue to progress pt as able.     01/16/21 1100  PT Visit Information  Last PT Received On 01/16/21  Assistance Needed +1 (chair follow gait)  History of Present Illness Patient is a 75 year old male who was admitted from home with altered mental status. patient was found to have hypomagnesmia and hypocalcemia. PMH; anxiety and kidney CA  with mets ( Purrently on chemo)  Subjective Data  Patient Stated Goal to go home today after spagetti  Precautions  Precautions Fall  Restrictions  Weight Bearing Restrictions No  Pain Assessment  Pain Assessment No/denies pain  Cognition  Arousal/Alertness Awake/alert  Behavior During Therapy [facility] for tasks assessed/performed  Overall Cognitive Status Within Functional Limits for tasks assessed  Bed Mobility  Overal bed mobility Needs Assistance  Bed Mobility Supine to Sit  Supine to sit Supervision;HOB elevated  General bed mobility comments supervision for safety. pt taking extra time and using bed rail  Transfers  Overall transfer level Needs assistance  Equipment used Rolling walker (2 wheeled)  Transfers Sit to/from Stand  Sit to Stand Min guard  General transfer comment min guard and cues for safe power up technique. Assist  Ambulation/Gait  Ambulation/Gait assistance Min guard  Gait Distance (Feet) 300 Feet  Assistive device Rolling walker (2 wheeled)  Gait Pattern/deviations Step-to pattern;Step-through pattern;Decreased step length - right;Decreased step length - left;Shuffle;Trunk flexed  General Gait Details pt with improved posture and maintained safe  proximity to RW this session. no overt LOB and improved endurance.  Gait velocity decr  Balance  Overall balance assessment Needs assistance  Sitting-balance support Feet supported  Sitting balance-Leahy Scale Good  Standing balance support During functional activity  Standing balance-Leahy Scale Fair  Other Exercises  Other Exercises 10 reps sit<>stand without UE use for power up  Other Exercises bil LE's: 10 reps hip ext, hip abd, march, heel raises  PT - End of Session  Equipment Utilized During Treatment Gait belt  Activity Tolerance Patient tolerated treatment well  Patient left in chair;with call bell/phone within reach;with chair alarm set  Nurse Communication Mobility status   PT - Assessment/Plan  PT Plan Current plan remains appropriate  PT Visit Diagnosis Unsteadiness on feet (R26.81);Muscle weakness (generalized) (M62.81);Difficulty in walking, not elsewhere classified (R26.2)  PT Frequency (ACUTE ONLY) Min 3X/week  Follow Up Recommendations Home health PT;SNF  PT equipment None recommended by PT  AM-PAC PT "6 Clicks" Mobility Outcome Measure (Version 2)  Help needed turning from your back to your side while in a flat bed without using bedrails? 3  Help needed moving from lying on your back to sitting on the side of a flat bed without using bedrails? 3  Help needed moving to and from a bed to a chair (including a wheelchair)? 3  Help needed standing up from a chair using your arms (e.g., wheelchair or bedside chair)? 3  Help needed to walk in hospital room? 3  Help needed climbing 3-5 steps with a railing?  2  6 Click Score 17  Consider Recommendation of Discharge To: Home with [facility]  PT Goal Progression  Progress towards PT goals Progressing  toward goals  Acute Rehab PT Goals  PT Goal Formulation Patient unable to participate in goal setting  Time For Goal Achievement 01/24/21  Potential to Achieve Goals Fair  PT Time Calculation  PT Start Time (ACUTE ONLY) 1103  PT  Stop Time (ACUTE ONLY) 1132  PT Time Calculation (min) (ACUTE ONLY) 29 min  PT General Charges  $$ ACUTE PT VISIT 1 Visit  PT Treatments  $Gait Training 8-22 mins  $Therapeutic Exercise 8-22 mins    Verner Mould, DPT Acute Rehabilitation Services Office [phone] Pager [phone]  Jacques Navy 01/16/2021, 5:43 PM

## 2021-01-16 NOTE — Discharge Summary (Signed)
Physician Discharge Summary  James Massey VEL:[id number] DOB: [birth year] DOA: 01/09/2021  PCP: [name], MD  Admit date: 01/09/2021 Discharge date: 01/16/2021  Disposition:  SNF  Recommendations for Outpatient Follow-up:  Follow-up with Dr. Alen Blew as outpatient Repeat TSH, free T4 as outpatient in 4 to 6 weeks Repeat labs including BMP, Mg to check his electrolytes in 1 week  Discharge Condition: Stable CODE STATUS: Full  Diet recommendation:  Diet Orders (From admission, onward)     Start     Ordered   01/09/21 1821  Diet regular Room service appropriate? Yes; Fluid consistency: Thin  Diet effective now       Question Answer Comment  Room service appropriate? Yes   Fluid consistency: Thin      01/09/21 1820           Brief/Interim Summary: James Massey is a 75 y.o. 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  History was obtained from his wife at the bedside because the patient is confused.  According to his wife, patient has been eating poorly for the past few weeks.  Appetite has been poor and he has been very picky with food.  Patient admitted that he was depressed so his wife took him to a psychiatrist.  He saw Lesle Chris, NP, with psychiatric team as an outpatient.  Follow-up with neurologist was recommended for cognitive decline/dementia.   He is on calcium, magnesium and potassium supplements at home but he has not been taking them as prescribed.  Patient was noticed to have significant worsening confusion and inability to walk on the morning of admission so he was brought to the emergency room for further evaluation.  Normally he uses a walker at home and does some exercises with home health PT.   He was found to have severe hypomagnesemia, hypocalcemia and hypokalemia.  Cabometyx was held  because of abnormal electrolytes.  Electrolytes were repleted orally and intravenously.  It is suspected that patient has underlying dementia.  Encephalopathy continue to improve.  Patient's electrolyte abnormalities improved as well.  He was discharged to skilled nursing facility.  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 [facility])   Altered mental status   Hypokalemia   Hypomagnesemia   Hypocalcemia   Hypothyroidism (acquired)   B12 deficiency   Depression   Hypoalbuminemia   Generalized weakness  Discharge Instructions  Discharge Instructions     Increase activity slowly   Complete by: As directed       Allergies as of 01/16/2021       Reactions   Bee Venom Swelling   Budesonide-formoterol Fumarate Other (See Comments)   RESPIRATORY ISSUES   Poison [employer] and ivy   Clindamycin/lincomycin Rash        Medication List     STOP taking these medications    Cabometyx 40 MG tablet Generic drug: cabozantinib   diazepam 5 MG tablet Commonly known as: VALIUM       TAKE these medications    allopurinol 300 MG tablet Commonly known as: ZYLOPRIM Take 300 mg by mouth at bedtime.   amLODipine 10 MG tablet Commonly known as: NORVASC Take 1 tablet (10 mg total) by mouth daily.   atorvastatin  40 MG tablet Commonly known as: LIPITOR Take 40 mg by mouth at bedtime.   CALCIUM 1200 PO Take 1,200 mg by mouth 2 (two) times daily.   colchicine 0.6 MG tablet Take 0.6 mg by mouth daily.   cyanocobalamin 1000 MCG tablet Take 1 tablet (1,000 mcg total) by mouth daily. Start taking on: January 17, 2021   furosemide 20 MG tablet Commonly known as: LASIX Take 1 tablet (20 mg total) by mouth daily for 14 days. What changed:  how much to take when to take this reasons to take this   hydrocortisone 2.5 % cream Apply 1  application topically 2 (two) times daily as needed (for hemorroidal flare ups).   levothyroxine 50 MCG tablet Commonly known as: Synthroid Take 1 tablet (50 mcg total) by mouth daily before breakfast. What changed:  medication strength how much to take   magnesium oxide 400 (240 Mg) MG tablet Commonly known as: MAG-OX Take 1 tablet (400 mg total) by mouth 2 (two) times daily.   mirtazapine 15 MG tablet Commonly known as: REMERON Take 1 tablet (15 mg total) by mouth at bedtime.   multivitamin tablet Take 2 tablets by mouth daily.   pantoprazole 40 MG tablet Commonly known as: PROTONIX Take 40 mg by mouth 2 (two) times daily.   potassium chloride 10 MEQ tablet Commonly known as: KLOR-CON Take 1 tablet (10 mEq total) by mouth 2 (two) times daily. Patient unable to tolerate 20 meq bid, is taking 10 meq bid. What changed:  medication strength how much to take additional instructions   sertraline 50 MG tablet Commonly known as: Zoloft Take 1/2 tablet 25 mg daily for 7 days. Then take one whole tablet 50 mg daily. What changed:  how much to take how to take this when to take this additional instructions   Simethicone 180 MG Caps Take 180 mg by mouth 3 (three) times daily as needed (for gas/indigestion.).   traMADol 50 MG tablet Commonly known as: ULTRAM Take 1 tablet (50 mg total) by mouth every 6 (six) hours as needed for moderate pain.        Allergies  Allergen Reactions   Bee Venom Swelling   Budesonide-Formoterol Fumarate Other (See Comments)    RESPIRATORY ISSUES   Poison [employer] and ivy   Clindamycin/Lincomycin Rash    Consultations: Oncology    Procedures/Studies: CT Head Wo Contrast  Result Date: 01/09/2021 CLINICAL DATA:  Delirium, intermittent episodes of confusion over past 3 weeks. History renal cell carcinoma, former smoker EXAM: CT HEAD WITHOUT CONTRAST TECHNIQUE: Contiguous axial images were obtained from the base of the  skull through the vertex without intravenous contrast. Sagittal and coronal MPR images reconstructed from axial data set. COMPARISON:  10/23/2017 FINDINGS: Brain: Generalized atrophy. Normal ventricular morphology. No midline shift or mass effect. Scattered small vessel chronic ischemic changes of deep cerebral white matter. No intracranial hemorrhage, mass lesion or evidence of acute infarction. No extra-axial fluid collections. Vascular: Atherosclerotic calcification of internal carotid and LEFT vertebral arteries at skull base. No hyperdense vessels. Skull: Intact Sinuses/Orbits: Mucosal thickening BILATERAL maxillary sinuses. Other: N/A IMPRESSION: Atrophy with small vessel chronic ischemic changes of deep cerebral white matter. No acute intracranial abnormalities. Electronically Signed   By: Lavonia Dana M.D.   On: 01/09/2021 17:13   MR BRAIN W WO CONTRAST  Result Date: 01/13/2021 CLINICAL DATA:  Delirium. Altered mental status. Active metastatic disease. EXAM: MRI HEAD WITHOUT AND WITH CONTRAST TECHNIQUE:  Multiplanar, multiecho pulse sequences of the brain and surrounding structures were obtained without and with intravenous contrast. CONTRAST:  42mL GADAVIST GADOBUTROL 1 MMOL/ML IV SOLN COMPARISON:  MRI Nov 05, 2020. FINDINGS: Brain: No acute infarction, hemorrhage, hydrocephalus, extra-axial collection or mass lesion. Incidental bilateral cord plexus xanthogranulomas. Similar mild for age scattered T2/FLAIR hyperintensities within the white matter, nonspecific but potentially related to chronic microvascular ischemic disease. Moderate atrophy with ex vacuo ventricular dilation. No abnormal enhancement. Vascular: Major arterial flow voids are maintained at the skull base. Skull and upper cervical spine: Unchanged 7 mm focus of T2/FLAIR hyperintensity in left parietal calvarium without solid enhancement, favored benign. Sinuses/Orbits: Moderate ethmoid air cell and maxillary sinus mucosal thickening. Other:  No sizable mastoid effusions IMPRESSION: 1. No evidence of acute intracranial abnormality or metastatic disease. 2. Mild for age chronic microvascular ischemic disease and moderate atrophy. 3. Moderate ethmoid and maxillary sinus mucosal thickening. Electronically Signed   By: Margaretha Sheffield MD   On: 01/13/2021 14:37       Discharge Exam: Vitals:   01/15/21 2033 01/16/21 0634  BP: 120/82 128/82  Pulse: 88 83  Resp: 18 18  Temp: 99.3 F (37.4 C) 98.8 F (37.1 C)  SpO2: 96% 94%    General: Pt is alert, awake, not in acute distress Cardiovascular: RRR, S1/S2 +, no edema Respiratory: CTA bilaterally, no wheezing, no rhonchi, no respiratory distress, no conversational dyspnea  Abdominal: Soft, NT, ND, bowel sounds + Extremities: no edema, no cyanosis Psych: Normal mood and affect, stable judgement and insight     The results of significant diagnostics from this hospitalization (including imaging, microbiology, ancillary and laboratory) are listed below for reference.     Microbiology: Recent Results (from the past 240 hour(s))  SARS CORONAVIRUS 2 (TAT 6-24 HRS) Nasopharyngeal Nasopharyngeal Swab     Status: None   Collection Time: 01/09/21  4:15 PM   Specimen: Nasopharyngeal Swab  Result Value Ref Range Status   SARS Coronavirus 2 NEGATIVE NEGATIVE Final    Comment: (NOTE) SARS-CoV-2 target nucleic acids are NOT DETECTED.  The SARS-CoV-2 RNA is generally detectable in upper and lower respiratory specimens during the acute phase of infection. Negative results do not preclude SARS-CoV-2 infection, do not rule out co-infections with other pathogens, and should not be used as the sole basis for treatment or other patient management decisions. Negative results must be combined with clinical observations, patient history, and epidemiological information. The expected result is Negative.  Fact Sheet for Patients: SugarRoll.be  Fact Sheet for  Healthcare Providers: https://www.woods-mathews.com/  This test is not yet approved or cleared by the [place] FDA and  has been authorized for detection and/or diagnosis of SARS-CoV-2 by FDA under an Emergency Use Authorization (EUA). This EUA will remain  in effect (meaning this test can be used) for the duration of the COVID-19 declaration under Se ction 564(b)(1) of the Act, 21 U.S.C. section 360bbb-3(b)(1), unless the authorization is terminated or revoked sooner.  Performed at [facility], [place] [address] [place], [place] [address]   SARS CORONAVIRUS 2 (TAT 6-24 HRS) Nasopharyngeal Nasopharyngeal Swab     Status: None   Collection Time: 01/14/21  9:00 PM   Specimen: Nasopharyngeal Swab  Result Value Ref Range Status   SARS Coronavirus 2 NEGATIVE NEGATIVE Final    Comment: (NOTE) SARS-CoV-2 target nucleic acids are NOT DETECTED.  The SARS-CoV-2 RNA is generally detectable in upper and lower respiratory specimens during the acute phase of infection. Negative results  do not preclude SARS-CoV-2 infection, do not rule out co-infections with other pathogens, and should not be used as the sole basis for treatment or other patient management decisions. Negative results must be combined with clinical observations, patient history, and epidemiological information. The expected result is Negative.  Fact Sheet for Patients: SugarRoll.be  Fact Sheet for Healthcare Providers: https://www.woods-mathews.com/  This test is not yet approved or cleared by the [place] FDA and  has been authorized for detection and/or diagnosis of SARS-CoV-2 by FDA under an Emergency Use Authorization (EUA). This EUA will remain  in effect (meaning this test can be used) for the duration of the COVID-19 declaration under Se ction 564(b)(1) of the Act, 21 U.S.C. section 360bbb-3(b)(1), unless the authorization is terminated or revoked  sooner.  Performed at [facility], [place] [address], [place], [place] [address]      Labs: BNP (last 3 results) No results for input(s): BNP in the last 8760 hours. Basic Metabolic Panel: Recent Labs  Lab 01/10/21 0412 01/11/21 0730 01/12/21 1906 01/13/21 0413 01/16/21 0415  NA 140 137 138 139 136  K 3.1* 3.4* 3.8 3.9 4.2  CL 103 102 104 104 102  CO2 27 27 27 27 28   GLUCOSE 100* 94 107* 95 99  BUN 10 11 11 11 16   CREATININE 1.10 1.23 0.95 0.97 1.09  CALCIUM 5.7* 6.1* 7.4* 7.4* 7.8*  MG 0.8* 1.5* 2.0 1.8 1.3*  PHOS 3.6  3.6  --   --   --   --    Liver Function Tests: Recent Labs  Lab 01/09/21 1414 01/11/21 0730  AST 52*  --   ALT 35  --   ALKPHOS 71  --   BILITOT 0.9  --   PROT 6.6  --   ALBUMIN 3.2* 2.7*   No results for input(s): LIPASE, AMYLASE in the last 168 hours. No results for input(s): AMMONIA in the last 168 hours. CBC: Recent Labs  Lab 01/09/21 1414  WBC 6.8  HGB 12.9*  HCT 36.4*  MCV 106.1*  PLT 172   Cardiac Enzymes: No results for input(s): CKTOTAL, CKMB, CKMBINDEX, TROPONINI in the last 168 hours. BNP: Invalid input(s): POCBNP CBG: Recent Labs  Lab 01/09/21 1420  GLUCAP 84   D-Dimer No results for input(s): DDIMER in the last 72 hours. Hgb A1c No results for input(s): HGBA1C in the last 72 hours. Lipid Profile No results for input(s): CHOL, HDL, LDLCALC, TRIG, CHOLHDL, LDLDIRECT in the last 72 hours. Thyroid function studies No results for input(s): TSH, T4TOTAL, T3FREE, THYROIDAB in the last 72 hours.  Invalid input(s): FREET3 Anemia work up No results for input(s): VITAMINB12, FOLATE, FERRITIN, TIBC, IRON, RETICCTPCT in the last 72 hours. Urinalysis    Component Value Date/Time   COLORURINE YELLOW 01/09/2021 2243   APPEARANCEUR CLEAR 01/09/2021 2243   LABSPEC 1.006 01/09/2021 2243   PHURINE 7.0 01/09/2021 2243   GLUCOSEU NEGATIVE 01/09/2021 2243   HGBUR NEGATIVE 01/09/2021 2243   BILIRUBINUR NEGATIVE 01/09/2021  2243   KETONESUR NEGATIVE 01/09/2021 2243   PROTEINUR NEGATIVE 01/09/2021 2243   NITRITE NEGATIVE 01/09/2021 2243   LEUKOCYTESUR NEGATIVE 01/09/2021 2243   Sepsis Labs Invalid input(s): PROCALCITONIN,  WBC,  LACTICIDVEN Microbiology Recent Results (from the past 240 hour(s))  SARS CORONAVIRUS 2 (TAT 6-24 HRS) Nasopharyngeal Nasopharyngeal Swab     Status: None   Collection Time: 01/09/21  4:15 PM   Specimen: Nasopharyngeal Swab  Result Value Ref Range Status   SARS Coronavirus 2  NEGATIVE NEGATIVE Final    Comment: (NOTE) SARS-CoV-2 target nucleic acids are NOT DETECTED.  The SARS-CoV-2 RNA is generally detectable in upper and lower respiratory specimens during the acute phase of infection. Negative results do not preclude SARS-CoV-2 infection, do not rule out co-infections with other pathogens, and should not be used as the sole basis for treatment or other patient management decisions. Negative results must be combined with clinical observations, patient history, and epidemiological information. The expected result is Negative.  Fact Sheet for Patients: SugarRoll.be  Fact Sheet for Healthcare Providers: https://www.woods-mathews.com/  This test is not yet approved or cleared by the [place] FDA and  has been authorized for detection and/or diagnosis of SARS-CoV-2 by FDA under an Emergency Use Authorization (EUA). This EUA will remain  in effect (meaning this test can be used) for the duration of the COVID-19 declaration under Se ction 564(b)(1) of the Act, 21 U.S.C. section 360bbb-3(b)(1), unless the authorization is terminated or revoked sooner.  Performed at [facility], [place] [address] [place], [place] [address]   SARS CORONAVIRUS 2 (TAT 6-24 HRS) Nasopharyngeal Nasopharyngeal Swab     Status: None   Collection Time: 01/14/21  9:00 PM   Specimen: Nasopharyngeal Swab  Result Value Ref Range Status   SARS  Coronavirus 2 NEGATIVE NEGATIVE Final    Comment: (NOTE) SARS-CoV-2 target nucleic acids are NOT DETECTED.  The SARS-CoV-2 RNA is generally detectable in upper and lower respiratory specimens during the acute phase of infection. Negative results do not preclude SARS-CoV-2 infection, do not rule out co-infections with other pathogens, and should not be used as the sole basis for treatment or other patient management decisions. Negative results must be combined with clinical observations, patient history, and epidemiological information. The expected result is Negative.  Fact Sheet for Patients: SugarRoll.be  Fact Sheet for Healthcare Providers: https://www.woods-mathews.com/  This test is not yet approved or cleared by the [place] FDA and  has been authorized for detection and/or diagnosis of SARS-CoV-2 by FDA under an Emergency Use Authorization (EUA). This EUA will remain  in effect (meaning this test can be used) for the duration of the COVID-19 declaration under Se ction 564(b)(1) of the Act, 21 U.S.C. section 360bbb-3(b)(1), unless the authorization is terminated or revoked sooner.  Performed at [facility], [place] [address] [place], [place] [address]      Patient was seen and examined on the day of discharge and was found to be in stable condition. Time coordinating discharge: 35 minutes including assessment and coordination of care, as well as examination of the patient.   SIGNED:  Dessa Phi, DO [facility] 01/16/2021, 10:21 AM

## 2021-01-16 NOTE — Progress Notes (Signed)
PTAR at bedside to transfer patient to SNF facility. VSS  and WNL. HS meds given, and prn Tylenol given see MAR.   Wife Hoyle Sauer called and made aware of the above and time patient left the unit.

## 2021-01-16 NOTE — Telephone Encounter (Signed)
Pt wife says they won't make 07/15 appts, cancelling per pt request, notified MD.

## 2021-01-16 NOTE — TOC Transition Note (Signed)
Transition of Care [facility]) - CM/SW Discharge Note   Patient Details  Name: James Massey MRN: [id number] Date of Birth: 1945-07-21  [facility] CM/SW Contact:  Lynnell Catalan, RN Phone Number: 01/16/2021, 11:59 AM   Clinical Narrative:    Pt to dc to [facility] today. PTAR scheduled for 2pm RN to call report to [phone].   Final next level of care: [place] Barriers to Discharge: No SNF bed (bed available tomorrow 7/12)   Patient Goals and CMS Choice Patient states their goals for this hospitalization and ongoing recovery are:: unable to state CMS Medicare.gov Compare Post Acute Care list provided to:: Patient Represenative (must comment) Choice offered to / list presented to : Spouse  Discharge Plan and Services In-house Referral: Clinical Social Work   Post Acute Care Choice: [place]              Readmission Risk Interventions No flowsheet data found.

## 2021-01-17 ENCOUNTER — Telehealth: Payer: Self-pay | Admitting: *Deleted

## 2021-01-17 NOTE — Telephone Encounter (Signed)
PC from patient, see documentation below.

## 2021-01-17 NOTE — Telephone Encounter (Signed)
-----   Message from [name], MD sent at 01/17/2021  1:39 PM EDT ----- I will take care of scheduling him. Thanks ----- Message ----- From: Rolene Course, RN Sent: 01/17/2021   1:33 PM EDT To: [name], MD  I will inform scheduling to arrange for appointment the week of August 1.  Do you want a lab appointment as well?  I spoke with Mrs Peters & informed her the patient needs to resume his cabometyx once he's discharged, but she said she isn't comfortable doing that until after they see you in August.    ----- Message ----- From: [name], MD Sent: 01/17/2021  12:46 PM EDT To: Rolene Course, RN  He needs to resume Cabometyx once he is discharged from the facility.  Follow-up will be arranged in the following 1 to 2 weeks after his discharge.  I am out of the office week of 7/25. ----- Message ----- From: Rolene Course, RN Sent: 01/17/2021  12:43 PM EDT To: [name], MD  Mrs. Severs called & wanted to give you an update:  Mr Vantol was admitted to [facility] last night for a 10-day program, he is expected to be discharged on 7/22 or 7/23.  She is requesting an appointment with you the following week - week of 7/25. According to her, the facility is supposed to call you for a consultation.  She also wanted to let you know he is still not taking the cabometyx or colchicine. She reports his mental status is much better, back to normal.  Please let me know if you want a F/U appointment scheduled.  Thanks, Bethena Roys

## 2021-01-18 ENCOUNTER — Telehealth: Payer: Self-pay | Admitting: Oncology

## 2021-01-18 ENCOUNTER — Other Ambulatory Visit (HOSPITAL_COMMUNITY): Payer: Self-pay

## 2021-01-18 NOTE — Telephone Encounter (Signed)
Scheduled appointment per 07/15 sch msg. Patient is aware. 

## 2021-01-21 ENCOUNTER — Other Ambulatory Visit (HOSPITAL_COMMUNITY): Payer: Self-pay

## 2021-01-24 ENCOUNTER — Other Ambulatory Visit (HOSPITAL_COMMUNITY): Payer: Self-pay

## 2021-01-25 ENCOUNTER — Ambulatory Visit: Payer: BLUE CROSS/BLUE SHIELD | Admitting: Oncology

## 2021-01-25 ENCOUNTER — Other Ambulatory Visit: Payer: BLUE CROSS/BLUE SHIELD

## 2021-01-28 ENCOUNTER — Inpatient Hospital Stay (HOSPITAL_BASED_OUTPATIENT_CLINIC_OR_DEPARTMENT_OTHER): Payer: Medicare Other | Admitting: Oncology

## 2021-01-28 ENCOUNTER — Other Ambulatory Visit: Payer: Self-pay

## 2021-01-28 ENCOUNTER — Inpatient Hospital Stay: Payer: Medicare Other | Attending: Oncology

## 2021-01-28 ENCOUNTER — Other Ambulatory Visit: Payer: Self-pay | Admitting: *Deleted

## 2021-01-28 VITALS — BP 122/78 | HR 93 | Temp 98.6°F | Resp 18 | Wt 174.2 lb

## 2021-01-28 DIAGNOSIS — N2889 Other specified disorders of kidney and ureter: Secondary | ICD-10-CM | POA: Diagnosis not present

## 2021-01-28 DIAGNOSIS — C7951 Secondary malignant neoplasm of bone: Secondary | ICD-10-CM | POA: Diagnosis present

## 2021-01-28 DIAGNOSIS — C649 Malignant neoplasm of unspecified kidney, except renal pelvis: Secondary | ICD-10-CM

## 2021-01-28 DIAGNOSIS — Z79899 Other long term (current) drug therapy: Secondary | ICD-10-CM | POA: Diagnosis not present

## 2021-01-28 DIAGNOSIS — Z923 Personal history of irradiation: Secondary | ICD-10-CM | POA: Insufficient documentation

## 2021-01-28 DIAGNOSIS — C7800 Secondary malignant neoplasm of unspecified lung: Secondary | ICD-10-CM | POA: Insufficient documentation

## 2021-01-28 DIAGNOSIS — C641 Malignant neoplasm of right kidney, except renal pelvis: Secondary | ICD-10-CM | POA: Diagnosis not present

## 2021-01-28 DIAGNOSIS — E039 Hypothyroidism, unspecified: Secondary | ICD-10-CM | POA: Insufficient documentation

## 2021-01-28 LAB — CMP (CANCER CENTER ONLY)
ALT: 27 U/L (ref 0–44)
AST: 23 U/L (ref 15–41)
Albumin: 3.2 g/dL — ABNORMAL LOW (ref 3.5–5.0)
Alkaline Phosphatase: 70 U/L (ref 38–126)
Anion gap: 8 (ref 5–15)
BUN: 20 mg/dL (ref 8–23)
CO2: 28 mmol/L (ref 22–32)
Calcium: 9.7 mg/dL (ref 8.9–10.3)
Chloride: 100 mmol/L (ref 98–111)
Creatinine: 1.25 mg/dL — ABNORMAL HIGH (ref 0.61–1.24)
GFR, Estimated: >60 mL/min (ref >60)
Glucose, Bld: 88 mg/dL (ref 70–99)
Potassium: 4.5 mmol/L (ref 3.5–5.1)
Sodium: 136 mmol/L (ref 135–145)
Total Bilirubin: 0.7 mg/dL (ref 0.3–1.2)
Total Protein: 6.7 g/dL (ref 6.5–8.1)

## 2021-01-28 LAB — CBC WITH DIFFERENTIAL (CANCER CENTER ONLY)
Abs Immature Granulocytes: 0.05 10*3/uL (ref 0.00–0.07)
Basophils Absolute: 0 10*3/uL (ref 0.0–0.1)
Basophils Relative: 1 %
Eosinophils Absolute: 0.2 10*3/uL (ref 0.0–0.5)
Eosinophils Relative: 4 %
HCT: 33.8 % — ABNORMAL LOW (ref 39.0–52.0)
Hemoglobin: 11.7 g/dL — ABNORMAL LOW (ref 13.0–17.0)
Immature Granulocytes: 1 %
Lymphocytes Relative: 11 %
Lymphs Abs: 0.6 10*3/uL — ABNORMAL LOW (ref 0.7–4.0)
MCH: 38.5 pg — ABNORMAL HIGH (ref 26.0–34.0)
MCHC: 34.6 g/dL (ref 30.0–36.0)
MCV: 111.2 fL — ABNORMAL HIGH (ref 80.0–100.0)
Monocytes Absolute: 0.7 10*3/uL (ref 0.1–1.0)
Monocytes Relative: 12 %
Neutro Abs: 4.2 10*3/uL (ref 1.7–7.7)
Neutrophils Relative %: 71 %
Platelet Count: 280 10*3/uL (ref 150–400)
RBC: 3.04 MIL/uL — ABNORMAL LOW (ref 4.22–5.81)
RDW: 13.8 % (ref 11.5–15.5)
WBC Count: 5.9 10*3/uL (ref 4.0–10.5)
nRBC: 0 % (ref 0.0–0.2)

## 2021-01-28 LAB — TSH: TSH: 4.505 u[IU]/mL — ABNORMAL HIGH (ref 0.320–4.118)

## 2021-01-28 MED ORDER — CALCIUM CARBONATE-VITAMIN D 500-200 MG-UNIT PO TABS: 2.0000 | ORAL_TABLET | Freq: Two times a day (BID) | ORAL | 0 refills | Status: AC

## 2021-01-28 NOTE — Progress Notes (Signed)
Hematology and Oncology Follow Up Visit  James Massey [id number] [birth year] 75 y.o. 01/28/2021 10:45 AM James Massey, MDShadad, James Dad, MD   Principle Diagnosis: 89 year old man with kidney cancer diagnosed in 2019 with localized disease.  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    Prior Therapy:  He is status post laparoscopic right radical nephrectomy on May 31, 2018.  He is status post right hip and left lung radiation therapy completed in August 2021.  He received 50 Gray in 5 fractions  He is status post radiation to the right posterior acetabular lesion completed in November 2021.  He received at 50 Gray in 5 fractions.  He status post radiation therapy to the right and the left proximal femur for a total of 30 Gray in 10 fractions completed in February 2022.  Current therapy: Cabometyx 40 mg daily started on 05/28/2020.  Nivolumab added on September 20, 2020.  Treatments are currently on hold due to recent hospitalization and declining performance status.  Interim History: James Massey returns today for a follow-up evaluation.  Since the last visit, he was hospitalized between July 6 and July 13 due to altered mental status.  His work-up did not reveal any clear etiology with some electrolyte imbalance including hypocalcemia and hypokalemia and hypothyroidism and his symptoms have improved at this time.  He was discharged to skilled nursing facility and currently receiving rehabilitation with improvement in his mental status as well as physical strength.  He is eating better and his overall quality of life has improved.   Medications: Unchanged on review. Current Outpatient Medications  Medication Sig Dispense Refill   allopurinol (ZYLOPRIM) 300 MG tablet Take 300 mg by mouth at bedtime.      amLODipine (NORVASC) 10 MG tablet Take 1 tablet (10 mg total) by mouth daily. 180 tablet 3   atorvastatin (LIPITOR) 40 MG tablet  Take 40 mg by mouth at bedtime.      Calcium Carbonate-Vit D-Min (CALCIUM 1200 PO) Take 1,200 mg by mouth 2 (two) times daily.     colchicine 0.6 MG tablet Take 0.6 mg by mouth daily.     furosemide (LASIX) 20 MG tablet Take 1 tablet (20 mg total) by mouth daily for 14 days. (Patient taking differently: Take 40 mg by mouth daily as needed for fluid.) 14 tablet 0   hydrocortisone 2.5 % cream Apply 1 application topically 2 (two) times daily as needed (for hemorroidal flare ups).     levothyroxine (SYNTHROID) 50 MCG tablet Take 1 tablet (50 mcg total) by mouth daily before breakfast. 30 tablet 2   magnesium oxide (MAG-OX) 400 (240 Mg) MG tablet Take 1 tablet (400 mg total) by mouth 2 (two) times daily. 60 tablet 2   mirtazapine (REMERON) 15 MG tablet Take 1 tablet (15 mg total) by mouth at bedtime. 30 tablet 1   Multiple Vitamin (MULTIVITAMIN) tablet Take 2 tablets by mouth daily.     pantoprazole (PROTONIX) 40 MG tablet Take 40 mg by mouth 2 (two) times daily.     potassium chloride SA (KLOR-CON) 10 MEQ tablet Take 1 tablet (10 mEq total) by mouth 2 (two) times daily. Patient unable to tolerate 20 meq bid, is taking 10 meq bid. 60 tablet 2   sertraline (ZOLOFT) 50 MG tablet Take 1/2 tablet 25 mg daily for 7 days. Then take one whole tablet 50 mg daily. 30 tablet 1   Simethicone 180 MG CAPS Take 180 mg  by mouth 3 (three) times daily as needed (for gas/indigestion.).     traMADol (ULTRAM) 50 MG tablet Take 1 tablet (50 mg total) by mouth every 6 (six) hours as needed for moderate pain. 30 tablet 0   vitamin B-12 1000 MCG tablet Take 1 tablet (1,000 mcg total) by mouth daily. 30 tablet 2   No current facility-administered medications for this visit.     Allergies:  Allergies  Allergen Reactions   Bee Venom Swelling   Budesonide-Formoterol Fumarate Other (See Comments)    RESPIRATORY ISSUES   Poison [employer] and ivy   Clindamycin/Lincomycin Rash     Physical  Exam:     Blood pressure 122/78, pulse 93, temperature 98.6 F (37 C), temperature source Oral, resp. rate 18, weight 174 lb 3.2 oz (79 kg), SpO2 99 %.     ECOG: 1    General appearance: Alert, awake without any distress. Head: Atraumatic without abnormalities Oropharynx: Without any thrush or ulcers. Eyes: No scleral icterus. Lymph nodes: No lymphadenopathy noted in the cervical, supraclavicular, or axillary nodes Heart:regular rate and rhythm, without any murmurs or gallops.   Lung: Clear to auscultation without any rhonchi, wheezes or dullness to percussion. Abdomin: Soft, nontender without any shifting dullness or ascites. Musculoskeletal: No clubbing or cyanosis. Neurological: No motor or sensory deficits.  Mental status is at baseline and overall normal. Skin: No rashes or lesions.            Lab Results: Lab Results  Component Value Date   WBC 5.9 01/28/2021   HGB 11.7 (L) 01/28/2021   HCT 33.8 (L) 01/28/2021   MCV 111.2 (H) 01/28/2021   PLT 280 01/28/2021     Chemistry      Component Value Date/Time   NA 136 01/16/2021 0415   K 4.2 01/16/2021 0415   CL 102 01/16/2021 0415   CO2 28 01/16/2021 0415   BUN 16 01/16/2021 0415   CREATININE 1.09 01/16/2021 0415   CREATININE 1.05 12/17/2020 1009      Component Value Date/Time   CALCIUM 7.8 (L) 01/16/2021 0415   ALKPHOS 71 01/09/2021 1414   AST 52 (H) 01/09/2021 1414   AST 39 12/17/2020 1009   ALT 35 01/09/2021 1414   ALT 34 12/17/2020 1009   BILITOT 0.9 01/09/2021 1414   BILITOT 0.7 12/17/2020 1009             Impression and Plan:   75 year old man with:   1.  Kidney cancer diagnosed in 2019.  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   Therapy has been withheld at this time due to his recent hospitalization for altered mental status.  Risks and benefits of resuming cancer treatment were discussed at this time and  treatment choices were reviewed.  It is unclear whether his symptoms are related to Cabometyx specifically versus overall toxicities and electrolyte imbalance.  At this time, I have recommended to continue with hold cancer treatment given his overall improvement in his quality of life and ability to thrive.  Imaging studies obtained in June 2022 showed excellent cancer treatment and response and overall stable disease.  The plan is to update his status in the next 3 to 4 weeks we will reevaluate at that time regarding the start of therapy.     3.  Prognosis and goals of care: Therapy remains palliative although aggressive measures are recommended given his performance status.  4.  Altered mental status: No evidence of metastatic disease based on the imaging studies of the brain.  It appears to be metabolic in nature and has resolved at this time.   5.  hypothyroidism: He is currently on thyroid replacement.  6.  Hypocalcemia: He is currently on calcium replacement and calcium levels currently pending.  7.  Follow up: In the next 3 to 4 weeks to follow his progress.     30  minutes were spent on this visit.  The time was dedicated to reviewing laboratory data, disease status update, treatment choices and future plan of care discussion.  [name], MD 7/25/202210:45 AM

## 2021-01-28 NOTE — Addendum Note (Signed)
Addended by: Wilmon Arms on: 01/28/2021 10:59 AM   Modules accepted: Orders

## 2021-01-29 ENCOUNTER — Other Ambulatory Visit (HOSPITAL_COMMUNITY): Payer: Self-pay

## 2021-01-30 ENCOUNTER — Telehealth: Payer: Self-pay | Admitting: Behavioral Health

## 2021-01-30 ENCOUNTER — Other Ambulatory Visit (HOSPITAL_COMMUNITY): Payer: Self-pay

## 2021-01-30 NOTE — Telephone Encounter (Signed)
Pt's wife called to see if Aaron Edelman can call her briefly so she can inform him about an episode that happened the morning after pt's last appt on July 5th. Pt has been in hospital for last 3 weeks.

## 2021-02-05 ENCOUNTER — Encounter: Payer: Self-pay | Admitting: Physical Therapy

## 2021-02-05 ENCOUNTER — Ambulatory Visit: Payer: Medicare Other | Attending: Physician Assistant | Admitting: Physical Therapy

## 2021-02-05 ENCOUNTER — Ambulatory Visit: Payer: Medicare Other | Admitting: Behavioral Health

## 2021-02-05 ENCOUNTER — Other Ambulatory Visit: Payer: Self-pay

## 2021-02-05 DIAGNOSIS — M25651 Stiffness of right hip, not elsewhere classified: Secondary | ICD-10-CM | POA: Diagnosis present

## 2021-02-05 DIAGNOSIS — M6281 Muscle weakness (generalized): Secondary | ICD-10-CM | POA: Diagnosis present

## 2021-02-05 DIAGNOSIS — M25551 Pain in right hip: Secondary | ICD-10-CM | POA: Diagnosis present

## 2021-02-05 DIAGNOSIS — M25561 Pain in right knee: Secondary | ICD-10-CM | POA: Insufficient documentation

## 2021-02-05 DIAGNOSIS — R2689 Other abnormalities of gait and mobility: Secondary | ICD-10-CM | POA: Diagnosis present

## 2021-02-05 DIAGNOSIS — R2681 Unsteadiness on feet: Secondary | ICD-10-CM | POA: Insufficient documentation

## 2021-02-05 DIAGNOSIS — R262 Difficulty in walking, not elsewhere classified: Secondary | ICD-10-CM | POA: Insufficient documentation

## 2021-02-05 DIAGNOSIS — G8929 Other chronic pain: Secondary | ICD-10-CM | POA: Diagnosis present

## 2021-02-05 NOTE — Therapy (Signed)
[facility] [address]  [place], [place], [address] Phone: [phone]   Fax:  [phone]  Physical Therapy Evaluation  Patient Details  Name: James Massey MRN: [id number] Date of Birth: 06/05/1946 Referring Provider (PT): Lawson Radar, PA-C   Encounter Date: 02/05/2021   PT End of Session - 02/05/21 1449     Visit Number 1    Number of Visits 16    Date for PT Re-Evaluation 04/02/21    Authorization Type Medicare & BCBS    PT Start Time 1449    PT Stop Time 1535    PT Time Calculation (min) 46 min    Activity Tolerance Patient tolerated treatment well    Behavior During Therapy WFL for tasks assessed/performed             Past Medical History:  Diagnosis Date   Anxiety    Barrett esophagus    GERD (gastroesophageal reflux disease)    Gout    Heart murmur    History of anemia    Kidney cancer, primary, 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 to other site [facility])    Mixed hyperlipidemia    Renal cell cancer, right ([place])    Right renal mass    Wears glasses     Past Surgical History:  Procedure Laterality Date   CYSTOSCOPY WITH RETROGRADE PYELOGRAM, URETEROSCOPY AND STENT PLACEMENT Right 05/21/2018   Procedure: RIGHT RETROGRADE PYELOGRAM, RIGHT DIAGNOSTIC URETEROSCOPY AND STENT PLACEMENT;  Surgeon: Ardis Hughs, MD;  Location: [facility];  Service: Urology;  Laterality: Right;   INGUINAL HERNIA REPAIR Bilateral 09/2012   LAPAROSCOPIC NEPHRECTOMY, HAND ASSISTED Right 05/31/2018   Procedure: LAPAROSCOPIC RADICAL RIGHT NEPHRECTOMY;  Surgeon: Ardis Hughs, MD;  Location: [facility];  Service: Urology;  Laterality: Right;    There were no vitals filed for this visit.    Subjective Assessment - 02/05/21 1457     Subjective Pt was hospitalized in early July (01/08/21-01/16/21) for altered mental status, poor appetite and inability to walk due to encephalopathy, followed by 2 week rehab stay at [facility] - D/C'd from rehab due to [place] spread but he has tested negative. Renal cancer treatment is currently at a standstill. Would like to work with PT to help rebuild his stamina and improving his walking stability.    Patient is accompained by: Family member   Wife   How long can you stand comfortably? ~10 minutes - normally must have walker for support    How long can you walk comfortably? 200 ft    Patient Stated Goals "ideally to be able to walk w/o having to use the walker"    Currently in Pain? Yes    Pain Score 4     Pain Location Hip    Pain Orientation Right    Pain Type Chronic pain    Pain Score 5    Pain Location Knee    Pain Orientation Right    Pain Type Chronic pain                [facility] PT Assessment - 02/05/21 1449       Assessment   Medical Diagnosis Deconditioning    Referring Provider (PT) Lawson Radar, PA-C    Onset Date/Surgical Date --   hospitalized in July but declining for a few months prior   Hand Dominance Right    Next MD Visit 03/18/21 with Rita Ohara, MD (PCP)    Prior  Therapy PT earlier this year - stopped due to cancer treatments      Precautions   Precautions Fall    Precaution Comments h/o metastatic lesions in R acetabulum & pelvis      Restrictions   Weight Bearing Restrictions No      Balance Screen   Has the patient fallen in the past 6 months No    Has the patient had a decrease in activity level because of a fear of falling?  No    Is the patient reluctant to leave their home because of a fear of falling?  No      Home Environment   Living Environment Private residence    Living Arrangements Spouse/significant other    Available Help at Discharge Family;Personal care attendant    Type of [place] to enter    Entrance Stairs-Number of Steps 1 + 1   for preferred entrance   [place] Two level;Bed/bath upstairs;Full bath on main level    [place] - 2 wheels;Walker - standard;Cane  - single point;Shower seat;Grab bars - tub/shower;Toilet riser;Wheelchair - manual   W/c on order; chair lift available for stairs     Prior Function   Level of Independence Independent with basic ADLs;Independent with household mobility with device;Needs assistance with homemaking    Vocation Retired    Leisure play pool, mostly sedentary      Cognition   Overall Cognitive Status Within Functional Limits for tasks assessed   90% back to normaly     Strength   Right Hip Flexion 4-/5    Right Hip Extension 4-/5    Right Hip External Rotation  3+/5    Right Hip Internal Rotation 4-/5    Right Hip ABduction 4-/5    Right Hip ADduction 4-/5    Left Hip Flexion 4-/5    Left Hip Extension 4-/5    Left Hip External Rotation 4-/5    Left Hip Internal Rotation 4-/5    Left Hip ABduction 4/5    Left Hip ADduction 4/5    Right Knee Flexion 4/5    Right Knee Extension 4/5    Left Knee Flexion 4/5    Left Knee Extension 4/5    Right Ankle Dorsiflexion 4/5    Left Ankle Dorsiflexion 4/5      Ambulation/Gait   Ambulation/Gait Yes    Ambulation/Gait Assistance 5: Supervision    Assistive device Rolling walker    Gait Pattern Step-through pattern;Antalgic;Decreased weight shift to right;Decreased stance time - right;Decreased step length - right;Decreased step length - left;Decreased stride length;Decreased hip/knee flexion - left;Poor foot clearance - left;Poor foot clearance - right;Shuffle   decreased heel strike bilaterally with decreased heel-toe progression   Ambulation Surface Level;Indoor    Gait velocity 1.64 ft/sec      Standardized Balance Assessment   Standardized Balance Assessment Five Times Sit to Stand;10 meter walk test    Five times sit to stand comments  13.0 sec with single R UE assist    10 Meter Walk 19.97 sec with RW                        Objective measurements completed on examination: See above findings.                 PT Short Term  Goals - 02/05/21 1535       PT SHORT TERM GOAL #1  Title Patient will be independent with initial HEP    Status New    Target Date 02/26/21      PT SHORT TERM GOAL #2   Title Patient will demonstrate safe transfer technique and proper gait pattern with 4-wheel rolling walker or LRAD as indicated    Status New    Target Date 03/05/21      PT SHORT TERM GOAL #3   Title Patient will increase gait speed to >/= 2.0 ft/sec with RW or LRAD to decrease risk for recurrent falls    Status New    Target Date 03/05/21      PT SHORT TERM GOAL #4   Title Determine balance goals based on results of standardized balance testing    Status New    Target Date 02/12/21               PT Long Term Goals - 02/05/21 1535       PT LONG TERM GOAL #1   Title Patient will be independent with ongoing/advanced HEP for self-management at home in order to build upon functional gains in therapy    Status New    Target Date 04/02/21      PT LONG TERM GOAL #2   Title Decrease R hip and knee pain by >/= 50% during transitional movements allowing patient increased ease of mobiliy and transfers    Status New    Target Date 04/02/21      PT LONG TERM GOAL #3   Title Patient will demonstrate improved B LE strength to >/= 4+/5 for improved stability and ease of mobility    Status New    Target Date 04/02/21      PT LONG TERM GOAL #4   Title Patient will increase gait speed to >/= 2.62 ft/sec with rollator, SPC or LRAD to increase safety with community ambulation    Status New    Target Date 04/02/21                    Plan - 02/05/21 1535     Clinical Impression Statement James Massey is a 74 y/o male who presents to OP PT for deconditioning with decreasing mobility and balance related to recent hospitalization (01/08/21 - 01/16/21) for encephalopathy as well as ongoing treatment for metastatic renal cell carcinoma with metastatic lesion to R acetabulum. Following hospitalization, he received 2  weeks of SNF rehab before returning home with assistance from his wife and a [facility] aide. Current deficits include R hip and knee pain with mobility and transitional movements (minimal to no pain at rest), R>L LE weakness, antalgic gait with abnormal gait pattern and decreased gait speed to 1.64 ft/sec, and impaired balance (formal testing to be completed on net visit. James Massey is very motivated to work with PT and will benefit from skilled PT to address above deficits to reduce or eliminate R hip and knee pain, restore functional LE strength, and increase balance to improve transitional mobility and walking/activity tolerance with LRAD.    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); R nephrectomy 2 cancer; OA; gout; GERD    Examination-Activity Limitations Bathing;Bed Mobility;Bend;Caring for Others;Lift;Carry;Locomotion Level;Sit;Sleep;Squat;Stairs;Stand;Toileting;Transfers    Examination-Participation Restrictions Community Activity;Driving;Interpersonal Relationship;Shop;Cleaning;Meal Prep;Yard Work    Stability/Clinical Decision Making Evolving/Moderate complexity    Clinical Decision Making Moderate    Rehab Potential Good    PT Frequency  2x / week    PT Duration 8 weeks    PT Treatment/Interventions ADLs/Self Care Home Management;Cryotherapy;Moist Heat;DME Instruction;Gait training;Stair training;Functional mobility training;Therapeutic activities;Therapeutic exercise;Balance training;Neuromuscular re-education;Patient/family education;Manual techniques;Passive range of motion;Dry needling;Taping;Vasopneumatic Device    PT Next Visit Plan standardized balance assessment; initial HEP instruction    Consulted and Agree with Plan of Care Patient;Family member/caregiver    Family Member Consulted wife             Patient  will benefit from skilled therapeutic intervention in order to improve the following deficits and impairments:  Abnormal gait, Decreased activity tolerance, Decreased balance, Decreased endurance, Decreased knowledge of precautions, Decreased knowledge of use of DME, Decreased mobility, Decreased range of motion, Decreased safety awareness, Decreased strength, Difficulty walking, Increased fascial restricitons, Increased muscle spasms, Impaired perceived functional ability, Impaired flexibility, Improper body mechanics, Postural dysfunction, Pain  Visit Diagnosis: Muscle weakness (generalized)  Unsteadiness on feet  Other abnormalities of gait and mobility  Difficulty in walking, not elsewhere classified  Chronic pain of right knee  Pain in right hip  Stiffness of right hip, not elsewhere classified     Problem List Patient Active Problem List   Diagnosis Date Noted   B12 deficiency 01/16/2021   Depression 01/16/2021   Hypoalbuminemia 01/16/2021   Generalized weakness 01/16/2021   Hypothyroidism (acquired) 01/13/2021   Altered mental status [id number]   Acute metabolic encephalopathy [id number]   Hypokalemia 01/09/2021   Hypomagnesemia 01/09/2021   Hypocalcemia 01/09/2021   Primary malignant neoplasm of kidney 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 to other site [facility]) 09/05/2020   Goals of care, counseling/discussion 09/05/2020   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 ([place]) 01/31/2020   History of right nephrectomy 08/13/2018   Renal mass, left 05/31/2018   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  [place], [place], [address] Phone: [phone]   Fax:  [phone]  Name: James Massey MRN: [id number] Date of Birth: [birth year]

## 2021-02-07 ENCOUNTER — Ambulatory Visit: Payer: Medicare Other | Admitting: Physical Therapy

## 2021-02-07 ENCOUNTER — Encounter: Payer: Self-pay | Admitting: Physical Therapy

## 2021-02-07 ENCOUNTER — Other Ambulatory Visit: Payer: Self-pay

## 2021-02-07 DIAGNOSIS — R2681 Unsteadiness on feet: Secondary | ICD-10-CM

## 2021-02-07 DIAGNOSIS — R262 Difficulty in walking, not elsewhere classified: Secondary | ICD-10-CM

## 2021-02-07 DIAGNOSIS — R2689 Other abnormalities of gait and mobility: Secondary | ICD-10-CM

## 2021-02-07 DIAGNOSIS — M6281 Muscle weakness (generalized): Secondary | ICD-10-CM

## 2021-02-07 NOTE — Therapy (Signed)
[facility] [address]  [place], [place], [address] Phone: [phone]   Fax:  [phone]  Physical Therapy Treatment  Patient Details  Name: James Massey MRN: [id number] Date of Birth: 02/26/1946 Referring Provider (PT): Lawson Radar, PA-C   Encounter Date: 02/07/2021   PT End of Session - 02/07/21 1445     Visit Number 2    Number of Visits 16    Date for PT Re-Evaluation 04/02/21    Authorization Type Medicare & BCBS    PT Start Time [id number]    PT Stop Time 1533    PT Time Calculation (min) 48 min    Activity Tolerance Patient tolerated treatment well    Behavior During Therapy [facility] for tasks assessed/performed             Past Medical History:  Diagnosis Date   Anxiety    Barrett esophagus    GERD (gastroesophageal reflux disease)    Gout    Heart murmur    History of anemia    Kidney cancer, primary, 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 to other site [facility])    Mixed hyperlipidemia    Renal cell cancer, right ([place])    Right renal mass    Wears glasses     Past Surgical History:  Procedure Laterality Date   CYSTOSCOPY WITH RETROGRADE PYELOGRAM, URETEROSCOPY AND STENT PLACEMENT Right 05/21/2018   Procedure: RIGHT RETROGRADE PYELOGRAM, RIGHT DIAGNOSTIC URETEROSCOPY AND STENT PLACEMENT;  Surgeon: Ardis Hughs, MD;  Location: [facility];  Service: Urology;  Laterality: Right;   INGUINAL HERNIA REPAIR Bilateral 09/2012   LAPAROSCOPIC NEPHRECTOMY, HAND ASSISTED Right 05/31/2018   Procedure: LAPAROSCOPIC RADICAL RIGHT NEPHRECTOMY;  Surgeon: Ardis Hughs, MD;  Location: [facility];  Service: Urology;  Laterality: Right;    There were no vitals filed for this visit.   Subjective Assessment - 02/07/21 1449     Subjective Pt's wife reports his vision exam revealed rapidly progressing cataracts and states he will likely need cataract surgery soon (waiting on 2nd opinion).    Patient is accompained by:  Family member   Wife   Currently in Pain? Yes    Pain Score 6     Pain Location Hip    Pain Orientation Right    Pain Descriptors / Indicators Aching    Pain Score 0    Pain Location Knee    Pain Orientation Right                [facility] PT Assessment - 02/07/21 1445       Standardized Balance Assessment   Standardized Balance Assessment Berg Balance Test      Berg Balance Test   Sit to Stand Able to stand  independently using hands    Standing Unsupported Able to stand 2 minutes with supervision    Sitting with Back Unsupported but Feet Supported on Floor or Stool Able to sit safely and securely 2 minutes    Stand to Sit Uses backs of legs against chair to control descent    Transfers Able to transfer with verbal cueing and /or supervision    Standing Unsupported with Eyes Closed Able to stand 10 seconds with supervision    Standing Unsupported with Feet Together Able to place feet together independently and stand for 1 minute with supervision   2-3" btw feet   From Standing, Reach Forward with Outstretched Arm Can reach forward >5 cm safely (2")  From Standing Position, Pick up Object from Floor Unable to try/needs assist to keep balance    From Standing Position, Turn to Look Behind Over each Shoulder Needs supervision when turning    Turn 360 Degrees Needs assistance while turning    Standing Unsupported, Alternately Place Feet on Step/Stool Needs assistance to keep from falling or unable to try    Standing Unsupported, One Foot in [place] Pinch to take small step independently and hold 30 seconds    Standing on One Leg Tries to lift leg/unable to hold 3 seconds but remains standing independently    Total Score 26    Berg comment: <36 = High risk for falls (close to 100%)      Timed Up and Go Test   Normal TUG (seconds) 26.5   with RW                          [facility] Adult PT Treatment/Exercise - 02/07/21 1445       Knee/Hip Exercises: Aerobic   Nustep  L5 x 6 min      Knee/Hip Exercises: Seated   Clamshell with TheraBand Red   10 x 3" - alt hipABD/ER (pt preferring to isolate 1 leg at a time)   Marching Both;10 reps;Strengthening    Marching Limitations looped red TB at knees                    PT Education - 02/07/21 1928     Education Details Results of balance testing and indications related to fall risk and need for AD; Initial HEP - Access Code: [email or web address]    Person(s) Educated Patient;Spouse    Methods Explanation;Demonstration;Verbal cues;Handout    Comprehension Verbalized understanding;Verbal cues required;Returned demonstration;Need further instruction              PT Short Term Goals - 02/07/21 1453       PT SHORT TERM GOAL #1   Title Patient will be independent with initial HEP    Status On-going    Target Date 02/26/21      PT SHORT TERM GOAL #2   Title Patient will demonstrate safe transfer technique and proper gait pattern with 4-wheel rolling walker or LRAD as indicated    Status On-going    Target Date 03/05/21      PT SHORT TERM GOAL #3   Title Patient will increase gait speed to >/= 2.0 ft/sec with RW or LRAD to decrease risk for recurrent falls    Status On-going    Target Date 03/05/21      PT SHORT TERM GOAL #4   Title Determine balance goals based on results of standardized balance testing    Status On-going    Target Date 02/12/21               PT Long Term Goals - 02/07/21 1454       PT LONG TERM GOAL #1   Title Patient will be independent with ongoing/advanced HEP for self-management at home in order to build upon functional gains in therapy    Status On-going    Target Date 04/02/21      PT LONG TERM GOAL #2   Title Decrease R hip and knee pain by >/= 50% during transitional movements allowing patient increased ease of mobiliy and transfers    Status On-going    Target Date 04/02/21      PT LONG TERM GOAL #3  Title Patient will demonstrate improved B LE strength  to >/= 4+/5 for improved stability and ease of mobility    Status On-going    Target Date 04/02/21      PT LONG TERM GOAL #4   Title Patient will increase gait speed to >/= 2.62 ft/sec with rollator, SPC or LRAD to increase safety with community ambulation    Status On-going    Target Date 04/02/21                   Plan - 02/07/21 1926     Clinical Impression Statement Standardized balance testing completed with all tests indicating a high risk for falls - TUG = 26.5 sec with RW (slightly improved from last testing during prior PT episode where TUG = 27.96 sec with RW) and Berg = 26/56 (decreased from 29/56 at beginning of last PT episode - not retested due to premature discharge from PT as a result of need to resume cancer treatments). Initiated LE strengthening program at seated level due to current level of weakness and impaired balance with beginnings of HEP provided - will plan to expand upon HEP in upcoming visits based on pt response and tolerance for exercise.    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); R nephrectomy 2 cancer; OA; gout; GERD    Rehab Potential Good    PT Frequency 2x / week    PT Duration 8 weeks    PT Treatment/Interventions ADLs/Self Care Home Management;Cryotherapy;Moist Heat;DME Instruction;Gait training;Stair training;Functional mobility training;Therapeutic activities;Therapeutic exercise;Balance training;Neuromuscular re-education;Patient/family education;Manual techniques;Passive range of motion;Dry needling;Taping;Vasopneumatic Device    PT Next Visit Plan core/lumboplevic and LE flexibility and strengthening - expand initial HEP as tolerated    PT Home Exercise Plan Access Code: [email or web address] (8/4)    Consulted and Agree with Plan of Care Patient;Family member/caregiver    Family Member Consulted wife             Patient will benefit from skilled therapeutic intervention in order to improve the  following deficits and impairments:  Abnormal gait, Decreased activity tolerance, Decreased balance, Decreased endurance, Decreased knowledge of precautions, Decreased knowledge of use of DME, Decreased mobility, Decreased range of motion, Decreased safety awareness, Decreased strength, Difficulty walking, Increased fascial restricitons, Increased muscle spasms, Impaired perceived functional ability, Impaired flexibility, Improper body mechanics, Postural dysfunction, Pain  Visit Diagnosis: Muscle weakness (generalized)  Unsteadiness on feet  Other abnormalities of gait and mobility  Difficulty in walking, not elsewhere classified     Problem List Patient Active Problem List   Diagnosis Date Noted   B12 deficiency 01/16/2021   Depression 01/16/2021   Hypoalbuminemia 01/16/2021   Generalized weakness 01/16/2021   Hypothyroidism (acquired) 01/13/2021   Altered mental status [id number]   Acute metabolic encephalopathy [id number]   Hypokalemia 01/09/2021   Hypomagnesemia 01/09/2021   Hypocalcemia 01/09/2021   Primary malignant neoplasm of kidney 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 to other site [facility]) 09/05/2020   Goals of care, counseling/discussion 09/05/2020   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

## 2021-02-07 NOTE — Patient Instructions (Signed)
   Access Code: [email or web address] URL: [email or web address] Date: 02/07/2021 Prepared by: Annie Paras  Exercises Seated Isometric Hip Abduction with Resistance - 1 x daily - 7 x weekly - 2 sets - 10 reps - 3 sec hold Seated March with Resistance - 1 x daily - 7 x weekly - 2 sets - 10 reps - 3 sec hold

## 2021-02-11 ENCOUNTER — Ambulatory Visit: Payer: Medicare Other | Admitting: Physical Therapy

## 2021-02-11 ENCOUNTER — Encounter: Payer: Self-pay | Admitting: Physical Therapy

## 2021-02-11 ENCOUNTER — Other Ambulatory Visit: Payer: Self-pay

## 2021-02-11 DIAGNOSIS — R2689 Other abnormalities of gait and mobility: Secondary | ICD-10-CM

## 2021-02-11 DIAGNOSIS — R262 Difficulty in walking, not elsewhere classified: Secondary | ICD-10-CM

## 2021-02-11 DIAGNOSIS — M25551 Pain in right hip: Secondary | ICD-10-CM

## 2021-02-11 DIAGNOSIS — M6281 Muscle weakness (generalized): Secondary | ICD-10-CM

## 2021-02-11 DIAGNOSIS — R2681 Unsteadiness on feet: Secondary | ICD-10-CM

## 2021-02-11 DIAGNOSIS — M25651 Stiffness of right hip, not elsewhere classified: Secondary | ICD-10-CM

## 2021-02-11 DIAGNOSIS — G8929 Other chronic pain: Secondary | ICD-10-CM

## 2021-02-11 NOTE — Patient Instructions (Addendum)
       Access Code: [email or web address] URL: [email or web address] Date: 02/11/2021 Prepared by: Annie Paras  Exercises Seated Isometric Hip Abduction with Resistance - 1 x daily - 7 x weekly - 2 sets - 10 reps - 3 sec hold Seated March with Resistance - 1 x daily - 7 x weekly - 2 sets - 10 reps - 3 sec hold Sit to Stand with Resistance Around Legs - 1 x daily - 7 x weekly - 2 sets - 10 reps Seated Hip Adduction Squeeze with Ball - 1 x daily - 7 x weekly - 2 sets - 10 reps - 5 sec hold Seated Hip Internal Rotation with Ball and Resistance - 1 x daily - 7 x weekly - 2 sets - 10 reps - 3 sec hold Seated Hamstring Stretch - 2-3 x daily - 7 x weekly - 3 reps - 30 sec hold Seated Piriformis Stretch - 2-3 x daily - 7 x weekly - 3 reps - 30 sec hold

## 2021-02-11 NOTE — Therapy (Signed)
[facility] [address]  [place], [place], [address] Phone: [phone]   Fax:  [phone]  Physical Therapy Treatment  Patient Details  Name: James Massey MRN: [id number] Date of Birth: 1945-12-08 Referring Provider (PT): Lawson Radar, PA-C   Encounter Date: 02/11/2021   PT End of Session - 02/11/21 1445     Visit Number 3    Number of Visits 16    Date for PT Re-Evaluation 04/02/21    Authorization Type Medicare & BCBS    PT Start Time 1446    PT Stop Time 1533    PT Time Calculation (min) 47 min    Activity Tolerance Patient tolerated treatment well    Behavior During Therapy [facility] for tasks assessed/performed             Past Medical History:  Diagnosis Date   Anxiety    Barrett esophagus    GERD (gastroesophageal reflux disease)    Gout    Heart murmur    History of anemia    Kidney cancer, primary, 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 to other site [facility])    Mixed hyperlipidemia    Renal cell cancer, right ([place])    Right renal mass    Wears glasses     Past Surgical History:  Procedure Laterality Date   CYSTOSCOPY WITH RETROGRADE PYELOGRAM, URETEROSCOPY AND STENT PLACEMENT Right 05/21/2018   Procedure: RIGHT RETROGRADE PYELOGRAM, RIGHT DIAGNOSTIC URETEROSCOPY AND STENT PLACEMENT;  Surgeon: Ardis Hughs, MD;  [facility];  Service: Urology;  Laterality: Right;   INGUINAL HERNIA REPAIR Bilateral 09/2012   LAPAROSCOPIC NEPHRECTOMY, HAND ASSISTED Right 05/31/2018   Procedure: LAPAROSCOPIC RADICAL RIGHT NEPHRECTOMY;  Surgeon: Ardis Hughs, MD;  [facility];  Service: Urology;  Laterality: Right;    There were no vitals filed for this visit.   Subjective Assessment - 02/11/21 1451     Subjective Pt reports he did not get to try the initial HEP due to poor time management on his part.    Patient is accompained by: Family member   Wife   Currently in Pain? Yes    Pain Score  6     Pain Location Hip    Pain Orientation Right    Pain Descriptors / Indicators Aching    Pain Type Chronic pain    Pain Frequency Intermittent                               [facility] Adult PT Treatment/Exercise - 02/11/21 1445       Exercises   Exercises Knee/Hip      Knee/Hip Exercises: Stretches   Passive Hamstring Stretch Right;Left;2 reps;30 seconds    Passive Hamstring Stretch Limitations seated hip hinge    Piriformis Stretch Right;Left;2 reps;30 seconds    Piriformis Stretch Limitations seated figure-4 with foot resting on 9" stool      Knee/Hip Exercises: Aerobic   Nustep L5 x 6 min (UE/LE)      Knee/Hip Exercises: Seated   Ball Squeeze 10 x 5"    Clamshell with TheraBand Red   15 x 3" - alt hipABD/ER (pt preferring to isolate 1 leg at a time)   Other Seated Knee/Hip Exercises Hip adduction isometric ball squeeze + R/L hip IR with looped red TB at ankles 10 x 3"    Marching Both;10 reps;Strengthening    Marching Limitations looped red TB  at knees    Sit to [facility] with UE support;5 reps;2 sets   from Airex pad on mat table; 2nd set + red TB hip ABD isometric                   PT Education - 02/11/21 1530     Education Details Expansion of initial HEP - Access Code: [email or web address]    Person(s) Educated Patient;Spouse    Methods Explanation;Demonstration;Verbal cues;Handout    Comprehension Verbalized understanding;Verbal cues required;Tactile cues required;Returned demonstration;Need further instruction              PT Short Term Goals - 02/11/21 1455       PT SHORT TERM GOAL #1   Title Patient will be independent with initial HEP    Status On-going    Target Date 02/26/21      PT SHORT TERM GOAL #2   Title Patient will demonstrate safe transfer technique and proper gait pattern with 4-wheel rolling walker or LRAD as indicated    Status On-going    Target Date 03/05/21      PT SHORT TERM GOAL #3   Title Patient will increase  gait speed to >/= 2.0 ft/sec with RW or LRAD to decrease risk for recurrent falls    Status On-going    Target Date 03/05/21      PT SHORT TERM GOAL #4   Title Determine balance goals based on results of standardized balance testing    Status Achieved   02/11/21              PT Long Term Goals - 02/11/21 1455       PT LONG TERM GOAL #1   Title Patient will be independent with ongoing/advanced HEP for self-management at home in order to build upon functional gains in therapy    Status On-going    Target Date 04/02/21      PT LONG TERM GOAL #2   Title Decrease R hip and knee pain by >/= 50% during transitional movements allowing patient increased ease of mobiliy and transfers    Status On-going    Target Date 04/02/21      PT LONG TERM GOAL #3   Title Patient will demonstrate improved B LE strength to >/= 4+/5 for improved stability and ease of mobility    Status On-going    Target Date 04/02/21      PT LONG TERM GOAL #4   Title Patient will increase gait speed to >/= 2.62 ft/sec with rollator, SPC or LRAD to increase safety with community ambulation    Status On-going    Target Date 04/02/21      PT LONG TERM GOAL #5   Title Patient will demonstrate decreased TUG time to </= 18 sec with SPC or LRAD to decrease risk for falls with transitional mobility    Status New    Target Date 04/02/21      PT LONG TERM GOAL #6   Title Patient will improve Berg score to >/= 40/56 to improve safety stability with ADLs in standing and reduce risk for falls    Status New    Target Date 04/02/21                   Plan - 02/11/21 1533     Clinical Impression Statement James Massey reports he has not yet attempted the initial HEP provided last week due to "poor time management on my part" therefore reviewed HEP exercises previously  provided and expanded on HEP to further address proximal LE flexibility and strengthening. Pt requiring modification of R piriformis stretch utilizing  stool in front rather than propping R LE on opposite knee or sideways on mat table but otherwise able to perform stretches and exercises well.    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); R nephrectomy 2 cancer; OA; gout; GERD    Rehab Potential Good    PT Frequency 2x / week    PT Duration 8 weeks    PT Treatment/Interventions ADLs/Self Care Home Management;Cryotherapy;Moist Heat;DME Instruction;Gait training;Stair training;Functional mobility training;Therapeutic activities;Therapeutic exercise;Balance training;Neuromuscular re-education;Patient/family education;Manual techniques;Passive range of motion;Dry needling;Taping;Vasopneumatic Device    PT Next Visit Plan core/lumboplevic and LE flexibility and strengthening - HEP updates as indicated    PT Home Exercise Plan Access Code: [email or web address] (8/4, updated 8/8)    Consulted and Agree with Plan of Care Patient;Family member/caregiver    Family Member Consulted wife             Patient will benefit from skilled therapeutic intervention in order to improve the following deficits and impairments:  Abnormal gait, Decreased activity tolerance, Decreased balance, Decreased endurance, Decreased knowledge of precautions, Decreased knowledge of use of DME, Decreased mobility, Decreased range of motion, Decreased safety awareness, Decreased strength, Difficulty walking, Increased fascial restricitons, Increased muscle spasms, Impaired perceived functional ability, Impaired flexibility, Improper body mechanics, Postural dysfunction, Pain  Visit Diagnosis: Muscle weakness (generalized)  Unsteadiness on feet  Other abnormalities of gait and mobility  Difficulty in walking, not elsewhere classified  Chronic pain of right knee  Pain in right hip  Stiffness of right hip, not elsewhere classified     Problem List Patient Active Problem List   Diagnosis Date Noted   B12 deficiency 01/16/2021    Depression 01/16/2021   Hypoalbuminemia 01/16/2021   Generalized weakness 01/16/2021   Hypothyroidism (acquired) 01/13/2021   Altered mental status [id number]   Acute metabolic encephalopathy [id number]   Hypokalemia 01/09/2021   Hypomagnesemia 01/09/2021   Hypocalcemia 01/09/2021   Primary malignant neoplasm of kidney 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 to other site [facility]) 09/05/2020   Goals of care, counseling/discussion 09/05/2020   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

## 2021-02-12 ENCOUNTER — Ambulatory Visit: Payer: Medicare Other | Admitting: Physical Therapy

## 2021-02-12 ENCOUNTER — Encounter: Payer: Self-pay | Admitting: Physical Therapy

## 2021-02-12 DIAGNOSIS — G8929 Other chronic pain: Secondary | ICD-10-CM

## 2021-02-12 DIAGNOSIS — R2681 Unsteadiness on feet: Secondary | ICD-10-CM

## 2021-02-12 DIAGNOSIS — M25561 Pain in right knee: Secondary | ICD-10-CM

## 2021-02-12 DIAGNOSIS — R2689 Other abnormalities of gait and mobility: Secondary | ICD-10-CM

## 2021-02-12 DIAGNOSIS — M25551 Pain in right hip: Secondary | ICD-10-CM

## 2021-02-12 DIAGNOSIS — R262 Difficulty in walking, not elsewhere classified: Secondary | ICD-10-CM

## 2021-02-12 DIAGNOSIS — M6281 Muscle weakness (generalized): Secondary | ICD-10-CM | POA: Diagnosis not present

## 2021-02-12 DIAGNOSIS — M25651 Stiffness of right hip, not elsewhere classified: Secondary | ICD-10-CM

## 2021-02-12 NOTE — Therapy (Signed)
[facility] [address]  [place], [place], [address] Phone: [phone]   Fax:  [phone]  Physical Therapy Treatment  Patient Details  Name: James Massey MRN: [id number] Date of Birth: [birth year] Referring Provider (PT): Lawson Radar, PA-C   Encounter Date: 02/12/2021   PT End of Session - 02/12/21 1528     Visit Number 4    Number of Visits 16    Date for PT Re-Evaluation 04/02/21    Authorization Type Medicare & BCBS    PT Start Time 1528    PT Stop Time 1616    PT Time Calculation (min) 48 min    Activity Tolerance Patient tolerated treatment well    Behavior During Therapy WFL for tasks assessed/performed             Past Medical History:  Diagnosis Date   Anxiety    Barrett esophagus    GERD (gastroesophageal reflux disease)    Gout    Heart murmur    History of anemia    Kidney cancer, primary, 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 to other site [facility])    Mixed hyperlipidemia    Renal cell cancer, right ([place])    Right renal mass    Wears glasses     Past Surgical History:  Procedure Laterality Date   CYSTOSCOPY WITH RETROGRADE PYELOGRAM, URETEROSCOPY AND STENT PLACEMENT Right 05/21/2018   Procedure: RIGHT RETROGRADE PYELOGRAM, RIGHT DIAGNOSTIC URETEROSCOPY AND STENT PLACEMENT;  Surgeon: Ardis Hughs, MD;  Location: [facility];  Service: Urology;  Laterality: Right;   INGUINAL HERNIA REPAIR Bilateral 09/2012   LAPAROSCOPIC NEPHRECTOMY, HAND ASSISTED Right 05/31/2018   Procedure: LAPAROSCOPIC RADICAL RIGHT NEPHRECTOMY;  Surgeon: Ardis Hughs, MD;  Location: [facility];  Service: Urology;  Laterality: Right;    There were no vitals filed for this visit.   Subjective Assessment - 02/12/21 1533     Subjective Pt's wife reports she has kept him busy today.    Patient is accompained by: Family member   Wife   Currently in Pain? Yes    Pain Score 5     Pain Location Hip    Pain  Orientation Right    Pain Descriptors / Indicators Aching    Pain Type Chronic pain                               [facility] Adult PT Treatment/Exercise - 02/12/21 1528       Ambulation/Gait   Ambulation/Gait Assistance 5: Supervision    Ambulation/Gait Assistance Details cues for fluid RW advancement with increased L stride length for increased step-through gait pattern    Ambulation Distance (Feet) 180 Feet    Assistive device Rolling walker    Gait Pattern Step-to pattern;Antalgic;Decreased weight shift to right;Decreased stance time - right;Decreased step length - left;Decreased hip/knee flexion - right;Decreased hip/knee flexion - left      High Level Balance   High Level Balance Activities Side stepping;Braiding;Tandem walking    High Level Balance Comments 60f x 2 along counter      Exercises   Exercises Knee/Hip      Knee/Hip Exercises: Aerobic   Nustep L5 x 6 min (UE/LE)      Knee/Hip Exercises: Seated   Long Arc Quad Right;Left;10 reps;Strengthening;Weights    Long Arc Quad Weight 2 lbs.  Balance Exercises - 02/12/21 1528       Balance Exercises: Standing   Tandem Stance Eyes open;Upper extremity support 1;2 reps;20 secs    SLS Eyes open;Upper extremity support 1;2 reps;10 secs    SLS with Vectors Solid surface;Upper extremity assist 1;5 reps   R/L SLS 3-way individual tap to cones x 5 & 3-way sequential tap to cones x 5   Tandem Gait Forward;Retro;Upper extremity support;2 reps   10 ft x2 along counter   Sidestepping Upper extremity support;2 reps   10 ft x 2 along counter   Other Standing Exercises Alt toe clears to 6" step x 20 - single UE support on counter    Other Standing Exercises Comments PT providing SBA to CGA for all activities                 PT Short Term Goals - 02/11/21 1455       PT SHORT TERM GOAL #1   Title Patient will be independent with initial HEP    Status On-going    Target Date  02/26/21      PT SHORT TERM GOAL #2   Title Patient will demonstrate safe transfer technique and proper gait pattern with 4-wheel rolling walker or LRAD as indicated    Status On-going    Target Date 03/05/21      PT SHORT TERM GOAL #3   Title Patient will increase gait speed to >/= 2.0 ft/sec with RW or LRAD to decrease risk for recurrent falls    Status On-going    Target Date 03/05/21      PT SHORT TERM GOAL #4   Title Determine balance goals based on results of standardized balance testing    Status Achieved   02/11/21              PT Long Term Goals - 02/12/21 1615       PT LONG TERM GOAL #1   Title Patient will be independent with ongoing/advanced HEP for self-management at home in order to build upon functional gains in therapy    Status On-going    Target Date 04/02/21      PT LONG TERM GOAL #2   Title Decrease R hip and knee pain by >/= 50% during transitional movements allowing patient increased ease of mobiliy and transfers    Status On-going    Target Date 04/02/21      PT LONG TERM GOAL #3   Title Patient will demonstrate improved B LE strength to >/= 4+/5 for improved stability and ease of mobility    Status On-going    Target Date 04/02/21      PT LONG TERM GOAL #4   Title Patient will increase gait speed to >/= 2.62 ft/sec with rollator, SPC or LRAD to increase safety with community ambulation    Status On-going    Target Date 04/02/21      PT LONG TERM GOAL #5   Title Patient will demonstrate decreased TUG time to </= 18 sec with SPC or LRAD to decrease risk for falls with transitional mobility    Status On-going    Target Date 04/02/21      PT LONG TERM GOAL #6   Title Patient will improve Berg score to >/= 40/56 to improve safety stability with ADLs in standing and reduce risk for falls    Status On-going    Target Date 04/02/21  Plan - 02/12/21 1535     Clinical Impression Statement Richard for second day in a  row, therefore shifted focus to more balance activities utilizing UE support on counter. Pt somewhat limited by fatigue and R hip/knee pain and weakness, requiring intermittent seated rest break. Gait training provided targeting increased step-through pattern and increased L LE stride length and more fluid RW advancement as pt tending to ambulate with step-to pattern favoring R LE - inconsistent follow-through with pt reverting to step-to pattern at time, especially on curves/turns.    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); R nephrectomy 2 cancer; OA; gout; GERD    Rehab Potential Good    PT Frequency 2x / week    PT Duration 8 weeks    PT Treatment/Interventions ADLs/Self Care Home Management;Cryotherapy;Moist Heat;DME Instruction;Gait training;Stair training;Functional mobility training;Therapeutic activities;Therapeutic exercise;Balance training;Neuromuscular re-education;Patient/family education;Manual techniques;Passive range of motion;Dry needling;Taping;Vasopneumatic Device    PT Next Visit Plan core/lumboplevic and LE flexibility and strengthening - HEP updates as indicated; balance training; gait training to normalize gait pattern and improve stability    PT Home Exercise Plan Access Code: [email or web address] (8/4, updated 8/8)    Consulted and Agree with Plan of Care Patient;Family member/caregiver    Family Member Consulted wife             Patient will benefit from skilled therapeutic intervention in order to improve the following deficits and impairments:  Abnormal gait, Decreased activity tolerance, Decreased balance, Decreased endurance, Decreased knowledge of precautions, Decreased knowledge of use of DME, Decreased mobility, Decreased range of motion, Decreased safety awareness, Decreased strength, Difficulty walking, Increased fascial restricitons, Increased muscle spasms, Impaired perceived functional ability, Impaired flexibility, Improper  body mechanics, Postural dysfunction, Pain  Visit Diagnosis: Muscle weakness (generalized)  Unsteadiness on feet  Other abnormalities of gait and mobility  Difficulty in walking, not elsewhere classified  Chronic pain of right knee  Pain in right hip  Stiffness of right hip, not elsewhere classified     Problem List Patient Active Problem List   Diagnosis Date Noted   B12 deficiency 01/16/2021   Depression 01/16/2021   Hypoalbuminemia 01/16/2021   Generalized weakness 01/16/2021   Hypothyroidism (acquired) 01/13/2021   Altered mental status [id number]   Acute metabolic encephalopathy A999333   Hypokalemia 01/09/2021   Hypomagnesemia 01/09/2021   Hypocalcemia 01/09/2021   Primary malignant neoplasm of kidney 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 to other site [facility]) 09/05/2020   Goals of care, counseling/discussion 09/05/2020   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  [employer] [place], [place], [address] Phone: [phone]   Fax:  [phone]  Name: Nachum Behr MRN: [id number] Date of Birth: 1945-11-03

## 2021-02-13 ENCOUNTER — Encounter: Payer: BLUE CROSS/BLUE SHIELD | Admitting: Physical Therapy

## 2021-02-18 ENCOUNTER — Other Ambulatory Visit: Payer: Self-pay

## 2021-02-18 ENCOUNTER — Ambulatory Visit: Payer: Medicare Other | Admitting: Physical Therapy

## 2021-02-18 ENCOUNTER — Encounter: Payer: Self-pay | Admitting: Physical Therapy

## 2021-02-18 DIAGNOSIS — G8929 Other chronic pain: Secondary | ICD-10-CM

## 2021-02-18 DIAGNOSIS — R2681 Unsteadiness on feet: Secondary | ICD-10-CM

## 2021-02-18 DIAGNOSIS — M6281 Muscle weakness (generalized): Secondary | ICD-10-CM

## 2021-02-18 DIAGNOSIS — R2689 Other abnormalities of gait and mobility: Secondary | ICD-10-CM

## 2021-02-18 DIAGNOSIS — M25561 Pain in right knee: Secondary | ICD-10-CM

## 2021-02-18 DIAGNOSIS — M25551 Pain in right hip: Secondary | ICD-10-CM

## 2021-02-18 DIAGNOSIS — M25651 Stiffness of right hip, not elsewhere classified: Secondary | ICD-10-CM

## 2021-02-18 DIAGNOSIS — R262 Difficulty in walking, not elsewhere classified: Secondary | ICD-10-CM

## 2021-02-18 NOTE — Therapy (Signed)
[facility] [address]  [place], [place], [address] Phone: [phone]   Fax:  [phone]  Physical Therapy Treatment  Patient Details  Name: James Massey MRN: [id number] Date of Birth: [birth year] Referring Provider (PT): Lawson Radar, PA-C   Encounter Date: 02/18/2021   PT End of Session - 02/18/21 1446     Visit Number 5    Number of Visits 16    Date for PT Re-Evaluation 04/02/21    Authorization Type Medicare & BCBS    PT Start Time 1446    PT Stop Time 1533    PT Time Calculation (min) 47 min    Activity Tolerance Patient tolerated treatment well    Behavior During Therapy [facility] for tasks assessed/performed             Past Medical History:  Diagnosis Date   Anxiety    Barrett esophagus    GERD (gastroesophageal reflux disease)    Gout    Heart murmur    History of anemia    Kidney cancer, primary, 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 to other site [facility])    Mixed hyperlipidemia    Renal cell cancer, right ([place])    Right renal mass    Wears glasses     Past Surgical History:  Procedure Laterality Date   CYSTOSCOPY WITH RETROGRADE PYELOGRAM, URETEROSCOPY AND STENT PLACEMENT Right 05/21/2018   Procedure: RIGHT RETROGRADE PYELOGRAM, RIGHT DIAGNOSTIC URETEROSCOPY AND STENT PLACEMENT;  Surgeon: Ardis Hughs, MD;  [facility];  Service: Urology;  Laterality: Right;   INGUINAL HERNIA REPAIR Bilateral 09/2012   LAPAROSCOPIC NEPHRECTOMY, HAND ASSISTED Right 05/31/2018   Procedure: LAPAROSCOPIC RADICAL RIGHT NEPHRECTOMY;  Surgeon: Ardis Hughs, MD;  Location[facility];  Service: Urology;  Laterality: Right;    There were no vitals filed for this visit.   Subjective Assessment - 02/18/21 1458     Subjective Pt reports he was able to go up/down 6 stairs with close guarding of his wife and his CNA but did not require any physical assist.  Feels like today is the best day he has had all  week.    Patient is accompained by: Family member   Wife   Currently in Pain? Yes    Pain Score 5     Pain Location Hip    Pain Orientation Right    Pain Descriptors / Indicators Aching    Pain Type Chronic pain    Pain Frequency Intermittent                               [facility] Adult PT Treatment/Exercise - 02/18/21 1446       Transfers   Transfers Sit to Stand;Stand to Sit    Sit to Stand 5: Supervision;Without upper extremity assist;With upper extremity assist;Multiple attempts    Sit to Stand Details Verbal cues for precautions/safety;Visual cues for safe use of DME/AE;Tactile cues for weight shifting    Stand to Sit 5: Supervision;Without upper extremity assist;To bed;To chair/3-in-1    Stand to Sit Details (indicate cue type and reason) Verbal cues for precautions/safety;Visual cues for safe use of DME/AE      Ambulation/Gait   Ambulation/Gait Assistance 4: Min guard;5: Supervision    Ambulation/Gait Assistance Details cues for rollator safety focusing on good rollator proximity with foot placement between rear wheels of walker as well as controlled speed    Ambulation  Distance (Feet) 360 Feet   180 on 2nd attempt   Assistive device 4-wheeled walker;Rollator    Gait Pattern Step-through pattern;Decreased weight shift to right;Decreased stance time - right;Decreased step length - left;Decreased hip/knee flexion - right;Decreased hip/knee flexion - left      Exercises   Exercises Knee/Hip      Knee/Hip Exercises: Aerobic   Nustep L5 x 6 min (UE/LE)      Knee/Hip Exercises: Seated   Other Seated Knee/Hip Exercises R/L Fitter leg press (1 black/1 blue) x 15    Sit to Sand without UE support   x 1 rep from mat table, x 7 reps from Airex pad on mat table                     PT Short Term Goals - 02/18/21 1515       PT SHORT TERM GOAL #1   Title Patient will be independent with initial HEP    Status On-going   02/18/21 - Pt reports he has not  attempted the HEP in the past week   Target Date 02/26/21      PT SHORT TERM GOAL #2   Title Patient will demonstrate safe transfer technique and proper gait pattern with 4-wheel rolling walker or LRAD as indicated    Status On-going   02/18/21 - introduced trial of gait with rollator   Target Date 03/05/21      PT SHORT TERM GOAL #3   Title Patient will increase gait speed to >/= 2.0 ft/sec with RW or LRAD to decrease risk for recurrent falls    Status On-going    Target Date 03/05/21      PT SHORT TERM GOAL #4   Title Determine balance goals based on results of standardized balance testing    Status Achieved   02/11/21              PT Long Term Goals - 02/12/21 1615       PT LONG TERM GOAL #1   Title Patient will be independent with ongoing/advanced HEP for self-management at home in order to build upon functional gains in therapy    Status On-going    Target Date 04/02/21      PT LONG TERM GOAL #2   Title Decrease R hip and knee pain by >/= 50% during transitional movements allowing patient increased ease of mobiliy and transfers    Status On-going    Target Date 04/02/21      PT LONG TERM GOAL #3   Title Patient will demonstrate improved B LE strength to >/= 4+/5 for improved stability and ease of mobility    Status On-going    Target Date 04/02/21      PT LONG TERM GOAL #4   Title Patient will increase gait speed to >/= 2.62 ft/sec with rollator, SPC or LRAD to increase safety with community ambulation    Status On-going    Target Date 04/02/21      PT LONG TERM GOAL #5   Title Patient will demonstrate decreased TUG time to </= 18 sec with SPC or LRAD to decrease risk for falls with transitional mobility    Status On-going    Target Date 04/02/21      PT LONG TERM GOAL #6   Title Patient will improve Berg score to >/= 40/56 to improve safety stability with ADLs in standing and reduce risk for falls    Status On-going  Target Date 04/02/21                    Plan - 02/18/21 1533     Clinical Impression Statement James Massey was very proud to report that he was able to ascend/descend 6 stairs in his home with supervision of his wife and CNA, using a sideways approach with both hands on the railing for the descent. He does admit to not having attempted the HEP in the past week but he and his wife state they will make sure he tries it while his CNA is there to help. Introduced trial of 4-wheel rollator for gait as pt had previously expressed possible interest in using a rollator. Provided instruction in safe transfer and gait technique with rollator with pt able to demonstrate more consistent step-through pattern with rollator than with RW but still favoring the R LE with decreased weight shift to R and shorter step length on L. Also, pt requiring cues to control speed so as not to let the rollator get too far ahead of him. Further training will be necessary if pt interested in using a rollator on his own.    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); R nephrectomy 2 cancer; OA; gout; GERD    Rehab Potential Good    PT Frequency 2x / week    PT Duration 8 weeks    PT Treatment/Interventions ADLs/Self Care Home Management;Cryotherapy;Moist Heat;DME Instruction;Gait training;Stair training;Functional mobility training;Therapeutic activities;Therapeutic exercise;Balance training;Neuromuscular re-education;Patient/family education;Manual techniques;Passive range of motion;Dry needling;Taping;Vasopneumatic Device    PT Next Visit Plan core/lumboplevic and LE flexibility and strengthening - HEP updates as indicated; balance training; gait training to normalize gait pattern and improve stability    PT Home Exercise Plan Access Code: [email or web address] (8/4, updated 8/8)    Consulted and Agree with Plan of Care Patient;Family member/caregiver    Family Member Consulted wife             Patient will benefit from  skilled therapeutic intervention in order to improve the following deficits and impairments:  Abnormal gait, Decreased activity tolerance, Decreased balance, Decreased endurance, Decreased knowledge of precautions, Decreased knowledge of use of DME, Decreased mobility, Decreased range of motion, Decreased safety awareness, Decreased strength, Difficulty walking, Increased fascial restricitons, Increased muscle spasms, Impaired perceived functional ability, Impaired flexibility, Improper body mechanics, Postural dysfunction, Pain  Visit Diagnosis: Muscle weakness (generalized)  Unsteadiness on feet  Other abnormalities of gait and mobility  Difficulty in walking, not elsewhere classified  Chronic pain of right knee  Pain in right hip  Stiffness of right hip, not elsewhere classified     Problem List Patient Active Problem List   Diagnosis Date Noted   B12 deficiency 01/16/2021   Depression 01/16/2021   Hypoalbuminemia 01/16/2021   Generalized weakness 01/16/2021   Hypothyroidism (acquired) 01/13/2021   Altered mental status [id number]   Acute metabolic encephalopathy [id number]   Hypokalemia 01/09/2021   Hypomagnesemia 01/09/2021   Hypocalcemia 01/09/2021   Primary malignant neoplasm of kidney 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 to other site [facility]) 09/05/2020   Goals of care, counseling/discussion 09/05/2020   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  [place], [place], [address] Phone: [phone]   Fax:  [phone]  Name: James Massey MRN: [id number] Date of Birth: 11/26/1945

## 2021-02-20 ENCOUNTER — Other Ambulatory Visit: Payer: Self-pay

## 2021-02-20 ENCOUNTER — Inpatient Hospital Stay: Payer: Medicare Other

## 2021-02-20 ENCOUNTER — Inpatient Hospital Stay: Payer: Medicare Other | Attending: Oncology | Admitting: Oncology

## 2021-02-20 VITALS — BP 134/79 | HR 97 | Temp 98.3°F | Resp 18 | Ht 68.0 in | Wt 179.5 lb

## 2021-02-20 DIAGNOSIS — C649 Malignant neoplasm of unspecified kidney, except renal pelvis: Secondary | ICD-10-CM

## 2021-02-20 DIAGNOSIS — C641 Malignant neoplasm of right kidney, except renal pelvis: Secondary | ICD-10-CM | POA: Diagnosis present

## 2021-02-20 DIAGNOSIS — C7800 Secondary malignant neoplasm of unspecified lung: Secondary | ICD-10-CM | POA: Insufficient documentation

## 2021-02-20 DIAGNOSIS — Z79899 Other long term (current) drug therapy: Secondary | ICD-10-CM | POA: Insufficient documentation

## 2021-02-20 DIAGNOSIS — E039 Hypothyroidism, unspecified: Secondary | ICD-10-CM

## 2021-02-20 DIAGNOSIS — C7951 Secondary malignant neoplasm of bone: Secondary | ICD-10-CM | POA: Diagnosis present

## 2021-02-20 DIAGNOSIS — N2889 Other specified disorders of kidney and ureter: Secondary | ICD-10-CM

## 2021-02-20 DIAGNOSIS — R918 Other nonspecific abnormal finding of lung field: Secondary | ICD-10-CM | POA: Diagnosis not present

## 2021-02-20 LAB — CBC WITH DIFFERENTIAL (CANCER CENTER ONLY)
Abs Immature Granulocytes: 0.09 10*3/uL — ABNORMAL HIGH (ref 0.00–0.07)
Basophils Absolute: 0.1 10*3/uL (ref 0.0–0.1)
Basophils Relative: 1 %
Eosinophils Absolute: 0.3 10*3/uL (ref 0.0–0.5)
Eosinophils Relative: 3 %
HCT: 33.9 % — ABNORMAL LOW (ref 39.0–52.0)
Hemoglobin: 11.9 g/dL — ABNORMAL LOW (ref 13.0–17.0)
Immature Granulocytes: 1 %
Lymphocytes Relative: 9 %
Lymphs Abs: 0.8 10*3/uL (ref 0.7–4.0)
MCH: 36.7 pg — ABNORMAL HIGH (ref 26.0–34.0)
MCHC: 35.1 g/dL (ref 30.0–36.0)
MCV: 104.6 fL — ABNORMAL HIGH (ref 80.0–100.0)
Monocytes Absolute: 1 10*3/uL (ref 0.1–1.0)
Monocytes Relative: 11 %
Neutro Abs: 7.4 10*3/uL (ref 1.7–7.7)
Neutrophils Relative %: 75 %
Platelet Count: 273 10*3/uL (ref 150–400)
RBC: 3.24 MIL/uL — ABNORMAL LOW (ref 4.22–5.81)
RDW: 12.4 % (ref 11.5–15.5)
WBC Count: 9.7 10*3/uL (ref 4.0–10.5)
nRBC: 0 % (ref 0.0–0.2)

## 2021-02-20 LAB — CMP (CANCER CENTER ONLY)
ALT: 20 U/L (ref 0–44)
AST: 18 U/L (ref 15–41)
Albumin: 3.2 g/dL — ABNORMAL LOW (ref 3.5–5.0)
Alkaline Phosphatase: 70 U/L (ref 38–126)
Anion gap: 11 (ref 5–15)
BUN: 18 mg/dL (ref 8–23)
CO2: 25 mmol/L (ref 22–32)
Calcium: 9.4 mg/dL (ref 8.9–10.3)
Chloride: 100 mmol/L (ref 98–111)
Creatinine: 1.38 mg/dL — ABNORMAL HIGH (ref 0.61–1.24)
GFR, Estimated: 53 mL/min — ABNORMAL LOW (ref >60)
Glucose, Bld: 90 mg/dL (ref 70–99)
Potassium: 4.8 mmol/L (ref 3.5–5.1)
Sodium: 136 mmol/L (ref 135–145)
Total Bilirubin: 0.5 mg/dL (ref 0.3–1.2)
Total Protein: 6.4 g/dL — ABNORMAL LOW (ref 6.5–8.1)

## 2021-02-20 LAB — TSH: TSH: 5.423 u[IU]/mL — ABNORMAL HIGH (ref 0.320–4.118)

## 2021-02-20 NOTE — Progress Notes (Signed)
Hematology and Oncology Follow Up Visit  James Massey [id number] Aug 10, 1945 75 y.o. 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  He presented with localized disease in 2019.  Prior Therapy:  He is status post laparoscopic right radical nephrectomy on May 31, 2018.  He is status post right hip and left lung radiation therapy completed in August 2021.  He received 50 Gray in 5 fractions  He is status post radiation to the right posterior acetabular lesion completed in November 2021.  He received at 50 Gray in 5 fractions.  He status post radiation therapy to the right and the left proximal femur for a total of 30 Gray in 10 fractions completed in February 2022.  Current therapy: Cabometyx 40 mg daily started on 05/28/2020.  Nivolumab added on September 20, 2020.  Treatments are currently on hold due to altered mental status on recent hospitalization.  Interim History: [name] presents today for repeat follow-up.  Since the last visit, he reports no major changes in his health.  He continues to improve slowly from his recent hospitalization and illnesses.  His mobility has improved in strength has been the best since November of last year.  He had denies any nausea, vomiting or abdominal pain.  He denies any diarrhea.  He is eating better and his weight is improving.  Medications: Updated on review. Current Outpatient Medications  Medication Sig Dispense Refill   allopurinol (ZYLOPRIM) 300 MG tablet Take 300 mg by mouth at bedtime.      amLODipine (NORVASC) 10 MG tablet Take 1 tablet (10 mg total) by mouth daily. 180 tablet 3   atorvastatin (LIPITOR) 40 MG tablet Take 40 mg by mouth at bedtime.      calcium-vitamin D (OSCAL WITH D) 500-200 MG-UNIT tablet Take 2 tablets by mouth 2 (two) times daily. 60 tablet 0   diazepam (VALIUM) 5 MG/ML  solution Take 5 mg by mouth every 8 (eight) hours as needed for anxiety.     furosemide (LASIX) 20 MG tablet Take 20 mg by mouth as needed.     hydrocortisone 2.5 % cream Apply 1 application topically 2 (two) times daily as needed (for hemorroidal flare ups).     levothyroxine (SYNTHROID) 50 MCG tablet Take 1 tablet (50 mcg total) by mouth daily before breakfast. 30 tablet 2   magnesium oxide (MAG-OX) 400 (240 Mg) MG tablet Take 1 tablet (400 mg total) by mouth 2 (two) times daily. (Patient not taking: Reported on 02/05/2021) 60 tablet 2   Multiple Vitamin (MULTIVITAMIN) tablet Take 2 tablets by mouth daily.     pantoprazole (PROTONIX) 40 MG tablet Take 40 mg by mouth 2 (two) times daily.     potassium chloride SA (KLOR-CON) 10 MEQ tablet Take 1 tablet (10 mEq total) by mouth 2 (two) times daily. Patient unable to tolerate 20 meq bid, is taking 10 meq bid. 60 tablet 2   Simethicone 180 MG CAPS Take 180 mg by mouth 3 (three) times daily as needed (for gas/indigestion.).     traMADol (ULTRAM) 50 MG tablet Take 1 tablet (50 mg total) by mouth every 6 (six) hours as needed for moderate pain. 30 tablet 0   vitamin B-12 1000 MCG tablet Take 1 tablet (1,000 mcg total) by mouth daily. (Patient not taking: Reported on 02/05/2021) 30 tablet 2   No current facility-administered medications for  this visit.     Allergies:  Allergies  Allergen Reactions   Bee Venom Swelling   Budesonide-Formoterol Fumarate Other (See Comments)    RESPIRATORY ISSUES   Colchicine Nausea And Vomiting   Poison [employer] and ivy   Clindamycin/Lincomycin Rash     Physical Exam:       Blood pressure 134/79, pulse 97, temperature 98.3 F (36.8 C), temperature source Oral, resp. rate 18, height '5\' 8"'$  (1.727 m), weight 179 lb 8 oz (81.4 kg), SpO2 100 %.    ECOG: 1   General appearance: Comfortable appearing without any discomfort Head: Normocephalic without any trauma Oropharynx: Mucous membranes  are moist and pink without any thrush or ulcers. Eyes: Pupils are equal and round reactive to light. Lymph nodes: No cervical, supraclavicular, inguinal or axillary lymphadenopathy.   Heart:regular rate and rhythm.  S1 and S2 without leg edema. Lung: Clear without any rhonchi or wheezes.  No dullness to percussion. Abdomin: Soft, nontender, nondistended with good bowel sounds.  No hepatosplenomegaly. Musculoskeletal: No joint deformity or effusion.  Full range of motion noted. Neurological: No deficits noted on motor, sensory and deep tendon reflex exam. Skin: No petechial rash or dryness.  Appeared moist.              Lab Results: Lab Results  Component Value Date   WBC 5.9 01/28/2021   HGB 11.7 (L) 01/28/2021   HCT 33.8 (L) 01/28/2021   MCV 111.2 (H) 01/28/2021   PLT 280 01/28/2021     Chemistry      Component Value Date/Time   NA 136 01/28/2021 1005   K 4.5 01/28/2021 1005   CL 100 01/28/2021 1005   CO2 28 01/28/2021 1005   BUN 20 01/28/2021 1005   CREATININE 1.25 (H) 01/28/2021 1005      Component Value Date/Time   CALCIUM 9.7 01/28/2021 1005   ALKPHOS 70 01/28/2021 1005   AST 23 01/28/2021 1005   ALT 27 01/28/2021 1005   BILITOT 0.7 01/28/2021 1005             Impression and Plan:   75 year old man with:   1.  Stage IV clear-cell renal cell carcinoma with bone involvement and pulmonary metastasis diagnosed in 2021.  His disease status was updated at this time and treatment choices were discussed.  Risks and benefits of restarting Cabometyx at the current dose versus lower dose versus continue to hold treatment were reiterated.  After discussion today, he opted to continue to hold treatment and we will update his staging scan in September 2022.     3.  Prognosis and goals of care: Therapy remains palliative although aggressive measures are warranted at this time.    4.  Altered mental status: Resolved at this time with normalization of his  mental status.  This is related to metabolic Derangement as well as hypothyroidism.   5.  hypothyroidism: His TSH is normalizing with thyroid replacement.  6.  Hypocalcemia: Resolved with calcium replacement.  7.  Follow up: We will be in 4 weeks for repeat evaluation after imaging studies.     30  minutes were spent on this encounter.  The time was dedicated to reviewing laboratory data, disease status update, discussing treatment choices and management options for the future.  [name], MD 8/17/20228:52 AM

## 2021-02-21 ENCOUNTER — Other Ambulatory Visit (HOSPITAL_COMMUNITY): Payer: Self-pay

## 2021-02-21 ENCOUNTER — Telehealth: Payer: Self-pay

## 2021-02-21 NOTE — Telephone Encounter (Signed)
Received call from patient spouse inquiring about upcoming appointments and when the scans will be scheduled. Explained that once the scans are authorized the scans will be scheduled. Patient spouse was concerned about waiting for scheduling so provided the contact for central scheduling and explained that they need to check and make sure the scans are authorized prior to the day of the scan. James Massey verbalized understanding and had no other questions or concerns.

## 2021-02-25 ENCOUNTER — Ambulatory Visit: Payer: Medicare Other | Admitting: Physical Therapy

## 2021-02-25 ENCOUNTER — Other Ambulatory Visit: Payer: Self-pay

## 2021-02-25 ENCOUNTER — Encounter: Payer: Self-pay | Admitting: Physical Therapy

## 2021-02-25 DIAGNOSIS — G8929 Other chronic pain: Secondary | ICD-10-CM

## 2021-02-25 DIAGNOSIS — R2681 Unsteadiness on feet: Secondary | ICD-10-CM

## 2021-02-25 DIAGNOSIS — M6281 Muscle weakness (generalized): Secondary | ICD-10-CM | POA: Diagnosis not present

## 2021-02-25 DIAGNOSIS — M25551 Pain in right hip: Secondary | ICD-10-CM

## 2021-02-25 DIAGNOSIS — R2689 Other abnormalities of gait and mobility: Secondary | ICD-10-CM

## 2021-02-25 DIAGNOSIS — R262 Difficulty in walking, not elsewhere classified: Secondary | ICD-10-CM

## 2021-02-25 DIAGNOSIS — M25651 Stiffness of right hip, not elsewhere classified: Secondary | ICD-10-CM

## 2021-02-25 NOTE — Therapy (Signed)
[facility] [address]  [place], [place], [address] Phone: [phone]   Fax:  [phone]  Physical Therapy Treatment  Patient Details  Name: James Massey MRN: [id number] Date of Birth: [birth year] Referring Provider (PT): Lawson Radar, PA-C   Encounter Date: 02/25/2021   PT End of Session - 02/25/21 1446     Visit Number 6    Number of Visits 16    Date for PT Re-Evaluation 04/02/21    Authorization Type Medicare & BCBS    PT Start Time 1446    PT Stop Time 1533    PT Time Calculation (min) 47 min    Activity Tolerance Patient tolerated treatment well    Behavior During Therapy [facility] for tasks assessed/performed             Past Medical History:  Diagnosis Date   Anxiety    Barrett esophagus    GERD (gastroesophageal reflux disease)    Gout    Heart murmur    History of anemia    Kidney cancer, primary, 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 to other site [facility])    Mixed hyperlipidemia    Renal cell cancer, right ([place])    Right renal mass    Wears glasses     Past Surgical History:  Procedure Laterality Date   CYSTOSCOPY WITH RETROGRADE PYELOGRAM, URETEROSCOPY AND STENT PLACEMENT Right 05/21/2018   Procedure: RIGHT RETROGRADE PYELOGRAM, RIGHT DIAGNOSTIC URETEROSCOPY AND STENT PLACEMENT;  Surgeon: Ardis Hughs, MD;  Location: [facility];  Service: Urology;  Laterality: Right;   INGUINAL HERNIA REPAIR Bilateral 09/2012   LAPAROSCOPIC NEPHRECTOMY, HAND ASSISTED Right 05/31/2018   Procedure: LAPAROSCOPIC RADICAL RIGHT NEPHRECTOMY;  Surgeon: Ardis Hughs, MD;  Location: [facility];  Service: Urology;  Laterality: Right;    There were no vitals filed for this visit.   Subjective Assessment - 02/25/21 1451     Subjective Pt's wife reports he has been doing well with the HEP, only missing Saturday since last PT visit.    Patient is accompained by: Family member   Wife   Currently in Pain? Yes     Pain Score 5    4-5/10   Pain Location Hip    Pain Orientation Right    Pain Descriptors / Indicators Sharp    Pain Type Chronic pain    Pain Frequency Intermittent                               [facility] Adult PT Treatment/Exercise - 02/25/21 1446       Ambulation/Gait   Ambulation/Gait Assistance 5: Supervision    Ambulation/Gait Assistance Details cues for fluid RW advancement with increased L stride length for increased step-through gait pattern    Ambulation Distance (Feet) 450 Feet    Assistive device Rolling walker    Gait Pattern Step-through pattern;Decreased weight shift to right;Decreased stance time - right;Decreased step length - left;Decreased hip/knee flexion - right;Decreased hip/knee flexion - left   R hip IR especially when turning/curving to L     Knee/Hip Exercises: Aerobic   Nustep L6 x 6 min (UE/LE)      Manual Therapy   Manual Therapy Joint mobilization;Soft tissue mobilization;Myofascial release;Passive ROM    Joint Mobilization R LE longitudinal distraction    Soft tissue mobilization STM/DTM to R glutes, piriformis & hip flexors    Myofascial Release manual TPR to R piriformis  Passive ROM gentle PROM/stretching into R hip IR/ER, adduction and extension                      PT Short Term Goals - 02/25/21 1453       PT SHORT TERM GOAL #1   Title Patient will be independent with initial HEP    Status Achieved   02/25/21     PT SHORT TERM GOAL #2   Title Patient will demonstrate safe transfer technique and proper gait pattern with 4-wheel rolling walker or LRAD as indicated    Status On-going   02/18/21 - introduced trial of gait with rollator; 02/25/21 - inconsistent step-through pattern with RW but better safety awareness with hand placement during transfers   Target Date 03/05/21      PT SHORT TERM GOAL #3   Title Patient will increase gait speed to >/= 2.0 ft/sec with RW or LRAD to decrease risk for recurrent falls     Status On-going    Target Date 03/05/21      PT SHORT TERM GOAL #4   Title Determine balance goals based on results of standardized balance testing    Status Achieved   02/11/21              PT Long Term Goals - 02/12/21 1615       PT LONG TERM GOAL #1   Title Patient will be independent with ongoing/advanced HEP for self-management at home in order to build upon functional gains in therapy    Status On-going    Target Date 04/02/21      PT LONG TERM GOAL #2   Title Decrease R hip and knee pain by >/= 50% during transitional movements allowing patient increased ease of mobiliy and transfers    Status On-going    Target Date 04/02/21      PT LONG TERM GOAL #3   Title Patient will demonstrate improved B LE strength to >/= 4+/5 for improved stability and ease of mobility    Status On-going    Target Date 04/02/21      PT LONG TERM GOAL #4   Title Patient will increase gait speed to >/= 2.62 ft/sec with rollator, SPC or LRAD to increase safety with community ambulation    Status On-going    Target Date 04/02/21      PT LONG TERM GOAL #5   Title Patient will demonstrate decreased TUG time to </= 18 sec with SPC or LRAD to decrease risk for falls with transitional mobility    Status On-going    Target Date 04/02/21      PT LONG TERM GOAL #6   Title Patient will improve Berg score to >/= 40/56 to improve safety stability with ADLs in standing and reduce risk for falls    Status On-going    Target Date 04/02/21                   Plan - 02/25/21 1454     Clinical Impression Statement James Massey and his wife report better compliance with HEP, only missing 1 day since last PT visit. He reports good comfort with HEP and denies need for further review today - STG #1 met. Continued cueing necessary to promote more fluid step-through pattern with gait with RW - gait pattern improving in straight paths but reverts to more of a step-to pattern while turning L>R with increased R  hip IR while turning to L creating increased  instability. Manual therapy performed to address R hip muscle tightness and ROM restriction with pt noting good relief and increased ease of step-through gait pattern following MT even while turning to L.    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); R nephrectomy 2 cancer; OA; gout; GERD    Rehab Potential Good    PT Frequency 2x / week    PT Duration 8 weeks    PT Treatment/Interventions ADLs/Self Care Home Management;Cryotherapy;Moist Heat;DME Instruction;Gait training;Stair training;Functional mobility training;Therapeutic activities;Therapeutic exercise;Balance training;Neuromuscular re-education;Patient/family education;Manual techniques;Passive range of motion;Dry needling;Taping;Vasopneumatic Device    PT Next Visit Plan core/lumboplevic and LE flexibility and strengthening - HEP updates as indicated; balance training; gait training to normalize gait pattern and improve stability    PT Home Exercise Plan Access Code: [email or web address] (8/4, updated 8/8)    Consulted and Agree with Plan of Care Patient;Family member/caregiver    Family Member Consulted wife             Patient will benefit from skilled therapeutic intervention in order to improve the following deficits and impairments:  Abnormal gait, Decreased activity tolerance, Decreased balance, Decreased endurance, Decreased knowledge of precautions, Decreased knowledge of use of DME, Decreased mobility, Decreased range of motion, Decreased safety awareness, Decreased strength, Difficulty walking, Increased fascial restricitons, Increased muscle spasms, Impaired perceived functional ability, Impaired flexibility, Improper body mechanics, Postural dysfunction, Pain  Visit Diagnosis: Muscle weakness (generalized)  Unsteadiness on feet  Other abnormalities of gait and mobility  Difficulty in walking, not elsewhere classified  Chronic pain of right  knee  Pain in right hip  Stiffness of right hip, not elsewhere classified     Problem List Patient Active Problem List   Diagnosis Date Noted   B12 deficiency 01/16/2021   Depression 01/16/2021   Hypoalbuminemia 01/16/2021   Generalized weakness 01/16/2021   Hypothyroidism (acquired) 01/13/2021   Altered mental status 40/34/7425   Acute metabolic encephalopathy 95/63/8756   Hypokalemia 01/09/2021   Hypomagnesemia 01/09/2021   Hypocalcemia 01/09/2021   Primary malignant neoplasm of kidney 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 to other site [facility]) 09/05/2020   Goals of care, counseling/discussion 09/05/2020   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 ([place]) 01/31/2020   History of right nephrectomy 08/13/2018   Renal mass, left 05/31/2018   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  [place], [place], [address] Phone: [phone]   Fax:  [phone]  Name: James Massey MRN: [id number] Date of Birth: 02/14/1946

## 2021-02-28 ENCOUNTER — Other Ambulatory Visit: Payer: Self-pay

## 2021-02-28 ENCOUNTER — Ambulatory Visit: Payer: Medicare Other | Admitting: Physical Therapy

## 2021-02-28 ENCOUNTER — Encounter: Payer: Self-pay | Admitting: Physical Therapy

## 2021-02-28 DIAGNOSIS — R2681 Unsteadiness on feet: Secondary | ICD-10-CM

## 2021-02-28 DIAGNOSIS — M6281 Muscle weakness (generalized): Secondary | ICD-10-CM | POA: Diagnosis not present

## 2021-02-28 DIAGNOSIS — G8929 Other chronic pain: Secondary | ICD-10-CM

## 2021-02-28 DIAGNOSIS — M25551 Pain in right hip: Secondary | ICD-10-CM

## 2021-02-28 DIAGNOSIS — M25651 Stiffness of right hip, not elsewhere classified: Secondary | ICD-10-CM

## 2021-02-28 DIAGNOSIS — R2689 Other abnormalities of gait and mobility: Secondary | ICD-10-CM

## 2021-02-28 DIAGNOSIS — R262 Difficulty in walking, not elsewhere classified: Secondary | ICD-10-CM

## 2021-02-28 NOTE — Therapy (Signed)
[facility] [address]  [place], [place], [address] Phone: [phone]   Fax:  [phone]  Physical Therapy Treatment  Patient Details  Name: James Massey MRN: [id number] Date of Birth: March 13, 1946 Referring Provider (PT): Lawson Radar, PA-C   Encounter Date: 02/28/2021   PT End of Session - 02/28/21 0848     Visit Number 7    Number of Visits 16    Date for PT Re-Evaluation 04/02/21    Authorization Type Medicare & BCBS    PT Start Time 6270    PT Stop Time 1542    PT Time Calculation (min) 54 min    Activity Tolerance Patient tolerated treatment well    Behavior During Therapy [facility] for tasks assessed/performed             Past Medical History:  Diagnosis Date   Anxiety    Barrett esophagus    GERD (gastroesophageal reflux disease)    Gout    Heart murmur    History of anemia    Kidney cancer, primary, 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 to other site [facility])    Mixed hyperlipidemia    Renal cell cancer, right ([place])    Right renal mass    Wears glasses     Past Surgical History:  Procedure Laterality Date   CYSTOSCOPY WITH RETROGRADE PYELOGRAM, URETEROSCOPY AND STENT PLACEMENT Right 05/21/2018   Procedure: RIGHT RETROGRADE PYELOGRAM, RIGHT DIAGNOSTIC URETEROSCOPY AND STENT PLACEMENT;  Surgeon: Ardis Hughs, MD;  Location: [facility];  Service: Urology;  Laterality: Right;   INGUINAL HERNIA REPAIR Bilateral 09/2012   LAPAROSCOPIC NEPHRECTOMY, HAND ASSISTED Right 05/31/2018   Procedure: LAPAROSCOPIC RADICAL RIGHT NEPHRECTOMY;  Surgeon: Ardis Hughs, MD;  Location: [facility];  Service: Urology;  Laterality: Right;    There were no vitals filed for this visit.   Subjective Assessment - 02/28/21 1500     Subjective Pt's CNA attending PT visit to help with carryover for home program.    Patient is accompained by: Family member   Wife & CNA   Currently in Pain? Yes    Pain Score 4    3-4/10    Pain Location Hip    Pain Orientation Right    Pain Descriptors / Indicators Sharp    Pain Type Chronic pain    Pain Frequency Intermittent                               [facility] Adult PT Treatment/Exercise - 02/28/21 1448       Transfers   Transfers Sit to Stand;Stand to Sit    Sit to Stand 5: Supervision;Without upper extremity assist;With upper extremity assist;Multiple attempts    Sit to Stand Details Verbal cues for precautions/safety;Visual cues for safe use of DME/AE;Tactile cues for weight shifting    Stand to Sit 5: Supervision;Without upper extremity assist;To bed;To chair/3-in-1    Stand to Sit Details (indicate cue type and reason) Verbal cues for precautions/safety;Visual cues for safe use of DME/AE      Ambulation/Gait   Ambulation/Gait Assistance 5: Supervision    Ambulation/Gait Assistance Details cues for fluid RW advancement with increased L stride length for increased step-through gait pattern    Ambulation Distance (Feet) 450 Feet    Assistive device 4-wheeled walker;Rollator    Gait Pattern Step-through pattern;Decreased weight shift to right;Decreased stance time - right;Decreased step length - left;Decreased hip/knee flexion -  right;Decreased hip/knee flexion - left   R hip IR especially when turning/curving to L     Knee/Hip Exercises: Seated   Clamshell with TheraBand Green   10 x 3" - alt hipABD/ER (pt preferring to isolate 1 leg at a time)   Other Seated Knee/Hip Exercises Hip adduction isometric ball squeeze + R/L hip IR with looped red TB at ankles 10 x 3"    Marching Both;10 reps;Strengthening    Marching Limitations looped green TB at knees    Sit to Sand without UE support;10 reps   + green TB hip ABD isometric, from Airex pad on mat table                     PT Short Term Goals - 02/28/21 1501       PT SHORT TERM GOAL #1   Title Patient will be independent with initial HEP    Status Achieved   02/25/21     PT  SHORT TERM GOAL #2   Title Patient will demonstrate safe transfer technique and proper gait pattern with 4-wheel rolling walker or LRAD as indicated    Status Partially Met   02/28/21 - improving step-through pattern with both RW and rollator but shorter step length on L and cues still intermittently necessary for rollator safety with brakes during transfers   Target Date 03/05/21      PT SHORT TERM GOAL #3   Title Patient will increase gait speed to >/= 2.0 ft/sec with RW or LRAD to decrease risk for recurrent falls    Status On-going    Target Date 03/05/21      PT SHORT TERM GOAL #4   Title Determine balance goals based on results of standardized balance testing    Status Achieved   02/11/21              PT Long Term Goals - 02/12/21 1615       PT LONG TERM GOAL #1   Title Patient will be independent with ongoing/advanced HEP for self-management at home in order to build upon functional gains in therapy    Status On-going    Target Date 04/02/21      PT LONG TERM GOAL #2   Title Decrease R hip and knee pain by >/= 50% during transitional movements allowing patient increased ease of mobiliy and transfers    Status On-going    Target Date 04/02/21      PT LONG TERM GOAL #3   Title Patient will demonstrate improved B LE strength to >/= 4+/5 for improved stability and ease of mobility    Status On-going    Target Date 04/02/21      PT LONG TERM GOAL #4   Title Patient will increase gait speed to >/= 2.62 ft/sec with rollator, SPC or LRAD to increase safety with community ambulation    Status On-going    Target Date 04/02/21      PT LONG TERM GOAL #5   Title Patient will demonstrate decreased TUG time to </= 18 sec with SPC or LRAD to decrease risk for falls with transitional mobility    Status On-going    Target Date 04/02/21      PT LONG TERM GOAL #6   Title Patient will improve Berg score to >/= 40/56 to improve safety stability with ADLs in standing and reduce risk  for falls    Status On-going    Target Date 04/02/21  Plan - 02/28/21 1542     Clinical Impression Statement Richard arrives to PT accompanied by his CNA, Benjamine Mola, in addition to his wife, requesting to have the CNA observe his exercises and therapeutic activities to assist with carryover during home performance. Reviewed safety with transfers including proper hand placement, safe use of rollator brakes, and weight shifting to facilitate transition sit to stand to reduce fall risk. Reviewed gait training with rollator, reinforcing upright posture, rollator proximity and even stride length to promote more fluid gait pattern. HEP reviewed, progressing resistance to green TB for several exercises while reinforcing desired movement patterns and avoidance of substitution as well as explaining modifications to accommodate pt needs. Richard and his CNA report good comfort with home activities and HEP following review. Pt's wife inquiring about purchasing rollator for home use, therefore reviewed options if looking for insurance to cover cost vs paying OOP.    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); R nephrectomy 2 cancer; OA; gout; GERD    Rehab Potential Good    PT Frequency 2x / week    PT Duration 8 weeks    PT Treatment/Interventions ADLs/Self Care Home Management;Cryotherapy;Moist Heat;DME Instruction;Gait training;Stair training;Functional mobility training;Therapeutic activities;Therapeutic exercise;Balance training;Neuromuscular re-education;Patient/family education;Manual techniques;Passive range of motion;Dry needling;Taping;Vasopneumatic Device    PT Next Visit Plan STG assessment; core/lumboplevic and LE flexibility and strengthening - HEP updates as indicated; balance training; gait training to normalize gait pattern and improve stability    PT Home Exercise Plan Access Code: [email or web address] (8/4, updated 8/8)    Consulted and  Agree with Plan of Care Patient;Family member/caregiver    Family Member Consulted wife             Patient will benefit from skilled therapeutic intervention in order to improve the following deficits and impairments:  Abnormal gait, Decreased activity tolerance, Decreased balance, Decreased endurance, Decreased knowledge of precautions, Decreased knowledge of use of DME, Decreased mobility, Decreased range of motion, Decreased safety awareness, Decreased strength, Difficulty walking, Increased fascial restricitons, Increased muscle spasms, Impaired perceived functional ability, Impaired flexibility, Improper body mechanics, Postural dysfunction, Pain  Visit Diagnosis: Muscle weakness (generalized)  Unsteadiness on feet  Other abnormalities of gait and mobility  Difficulty in walking, not elsewhere classified  Chronic pain of right knee  Pain in right hip  Stiffness of right hip, not elsewhere classified     Problem List Patient Active Problem List   Diagnosis Date Noted   B12 deficiency 01/16/2021   Depression 01/16/2021   Hypoalbuminemia 01/16/2021   Generalized weakness 01/16/2021   Hypothyroidism (acquired) 01/13/2021   Altered mental status 44/31/5400   Acute metabolic encephalopathy 86/76/1950   Hypokalemia 01/09/2021   Hypomagnesemia 01/09/2021   Hypocalcemia 01/09/2021   Primary malignant neoplasm of kidney 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 to other site [facility]) 09/05/2020   Goals of care, counseling/discussion 09/05/2020   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 ([place]) 01/31/2020   History of right nephrectomy 08/13/2018   Renal mass, left 05/31/2018   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   Barrett's esophagus 08/30/2015   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    Percival Spanish, PT, MPT 02/28/2021, 4:07 PM  [facility] [address]  [place], [place], [address] Phone: [phone]   Fax:   [phone]  Name: Jrue Jarriel MRN: [id number] Date of Birth: 07-19-1945

## 2021-03-04 ENCOUNTER — Encounter: Payer: BLUE CROSS/BLUE SHIELD | Admitting: Physical Therapy

## 2021-03-07 ENCOUNTER — Other Ambulatory Visit: Payer: Self-pay

## 2021-03-07 ENCOUNTER — Ambulatory Visit: Payer: Medicare Other | Attending: Physician Assistant | Admitting: Physical Therapy

## 2021-03-07 VITALS — BP 110/68 | HR 98

## 2021-03-07 DIAGNOSIS — R2681 Unsteadiness on feet: Secondary | ICD-10-CM | POA: Diagnosis present

## 2021-03-07 DIAGNOSIS — M25551 Pain in right hip: Secondary | ICD-10-CM

## 2021-03-07 DIAGNOSIS — M25651 Stiffness of right hip, not elsewhere classified: Secondary | ICD-10-CM | POA: Diagnosis present

## 2021-03-07 DIAGNOSIS — M25561 Pain in right knee: Secondary | ICD-10-CM | POA: Diagnosis present

## 2021-03-07 DIAGNOSIS — R262 Difficulty in walking, not elsewhere classified: Secondary | ICD-10-CM | POA: Diagnosis present

## 2021-03-07 DIAGNOSIS — M6281 Muscle weakness (generalized): Secondary | ICD-10-CM

## 2021-03-07 DIAGNOSIS — G8929 Other chronic pain: Secondary | ICD-10-CM | POA: Diagnosis present

## 2021-03-07 DIAGNOSIS — R2689 Other abnormalities of gait and mobility: Secondary | ICD-10-CM

## 2021-03-07 NOTE — Therapy (Signed)
[facility] [address]  [place], [place], [address] Phone: [phone]   Fax:  [phone]  Physical Therapy Treatment  Patient Details  Name: James Massey MRN: [id number] Date of Birth: 05-04-1946 Referring Provider (PT): Lawson Radar, PA-C   Encounter Date: 03/07/2021   PT End of Session - 03/07/21 1446     Visit Number 8    Number of Visits 16    Date for PT Re-Evaluation 04/02/21    Authorization Type Medicare & BCBS    PT Start Time 1446    PT Stop Time 1542    PT Time Calculation (min) 56 min    Activity Tolerance Patient tolerated treatment well    Behavior During Therapy WFL for tasks assessed/performed             Past Medical History:  Diagnosis Date   Anxiety    Barrett esophagus    GERD (gastroesophageal reflux disease)    Gout    Heart murmur    History of anemia    Kidney cancer, primary, 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 to other site [facility])    Mixed hyperlipidemia    Renal cell cancer, right ([place])    Right renal mass    Wears glasses     Past Surgical History:  Procedure Laterality Date   CYSTOSCOPY WITH RETROGRADE PYELOGRAM, URETEROSCOPY AND STENT PLACEMENT Right 05/21/2018   Procedure: RIGHT RETROGRADE PYELOGRAM, RIGHT DIAGNOSTIC URETEROSCOPY AND STENT PLACEMENT;  Surgeon: Ardis Hughs, MD;  [facility];  Service: Urology;  Laterality: Right;   INGUINAL HERNIA REPAIR Bilateral 09/2012   LAPAROSCOPIC NEPHRECTOMY, HAND ASSISTED Right 05/31/2018   Procedure: LAPAROSCOPIC RADICAL RIGHT NEPHRECTOMY;  Surgeon: Ardis Hughs, MD;  [facility];  Service: Urology;  Laterality: Right;    Vitals:   03/07/21 1454  BP: 110/68  Pulse: 98  SpO2: 96%     Subjective Assessment - 03/07/21 1446     Subjective Pt & wife report R hip pain has worse since late last week and the weekend - unaware of any physical trigger for increase in pain but thinks it may be related  to "mental anguish" related to multiple medical issues that he is currently dealing with as well as upcoming CT scan and procdures for his eyes.    Patient is accompained by: Family member   Wife & CNA   Currently in Pain? Yes    Pain Score 8     Pain Location Hip    Pain Orientation Right    Pain Descriptors / Indicators Sharp    Pain Type Chronic pain    Pain Frequency Constant    Pain Score 8    Pain Location Knee    Pain Orientation Right    Pain Descriptors / Indicators Sharp    Pain Type Chronic pain                               [facility] Adult PT Treatment/Exercise - 03/07/21 0001       Knee/Hip Exercises: Stretches   Piriformis Stretch Right;1 rep;30 seconds    Piriformis Stretch Limitations manual by PT      Knee/Hip Exercises: Aerobic   Nustep L6 x 6 min (UE/LE)      Modalities   Modalities Moist Heat      Moist Heat Therapy   Number Minutes Moist Heat 10 Minutes  Moist Heat Location Hip      Manual Therapy   Manual Therapy Soft tissue mobilization;Myofascial release;Passive ROM    Soft tissue mobilization STM/DTM to R  hip flexors, TFL, hip adductors and proximal quads    Myofascial Release manual TPR to R proximal iliacus, distal iliopsoas, TFL & proximal hip adductors    Passive ROM gentle PROM/stretching into R hip IR/ER, adduction - very limited tolerance d/t pain, esp with IR                    PT Education - 03/07/21 1530     Education Details Deep breathing & relaxation techniques, mindfulness for pain management    Person(s) Educated Patient    Methods Explanation;Handout    Comprehension Verbalized understanding              PT Short Term Goals - 02/28/21 1501       PT SHORT TERM GOAL #1   Title Patient will be independent with initial HEP    Status Achieved   02/25/21     PT SHORT TERM GOAL #2   Title Patient will demonstrate safe transfer technique and proper gait pattern with 4-wheel rolling walker or  LRAD as indicated    Status Partially Met   02/28/21 - improving step-through pattern with both RW and rollator but shorter step length on L and cues still intermittently necessary for rollator safety with brakes during transfers   Target Date 03/05/21      PT SHORT TERM GOAL #3   Title Patient will increase gait speed to >/= 2.0 ft/sec with RW or LRAD to decrease risk for recurrent falls    Status On-going    Target Date 03/05/21      PT SHORT TERM GOAL #4   Title Determine balance goals based on results of standardized balance testing    Status Achieved   02/11/21              PT Long Term Goals - 02/12/21 1615       PT LONG TERM GOAL #1   Title Patient will be independent with ongoing/advanced HEP for self-management at home in order to build upon functional gains in therapy    Status On-going    Target Date 04/02/21      PT LONG TERM GOAL #2   Title Decrease R hip and knee pain by >/= 50% during transitional movements allowing patient increased ease of mobiliy and transfers    Status On-going    Target Date 04/02/21      PT LONG TERM GOAL #3   Title Patient will demonstrate improved B LE strength to >/= 4+/5 for improved stability and ease of mobility    Status On-going    Target Date 04/02/21      PT LONG TERM GOAL #4   Title Patient will increase gait speed to >/= 2.62 ft/sec with rollator, SPC or LRAD to increase safety with community ambulation    Status On-going    Target Date 04/02/21      PT LONG TERM GOAL #5   Title Patient will demonstrate decreased TUG time to </= 18 sec with SPC or LRAD to decrease risk for falls with transitional mobility    Status On-going    Target Date 04/02/21      PT LONG TERM GOAL #6   Title Patient will improve Berg score to >/= 40/56 to improve safety stability with ADLs in standing and reduce risk  for falls    Status On-going    Target Date 04/02/21                   Plan - 03/07/21 1542     Clinical Impression  Statement James Massey reports increased R hip pain as of the end of last week w/o known physical trigger, although he and his wife think that there may be a psychological component - they note "mental anguish" related to ongoing and upcoming medical conditions, testing and procedures. Pain localized primarily to anterior hip today however pt does also acknowledge R knee pain which he lumps in with the hip pain. MT with STM, MFR and TPR resulting in some reduction in pain, but attempts at gentle ROM and stretching still poorly tolerated due to pain. Exercise attempts deferred d/t increased pain with all motions of R hip and unable to complete STG assessment d/t pain. Session concluded with moist heat application to promote further muscle relaxation and pain relief. Pt and wife note attempting relaxation techniques at home to try to help manage pain, therefore provided education on breathing and relaxation techniques as well as mindfulness in managing pain.    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); R nephrectomy 2 cancer; OA; gout; GERD    Rehab Potential Good    PT Frequency 2x / week    PT Duration 8 weeks    PT Treatment/Interventions ADLs/Self Care Home Management;Cryotherapy;Moist Heat;DME Instruction;Gait training;Stair training;Functional mobility training;Therapeutic activities;Therapeutic exercise;Balance training;Neuromuscular re-education;Patient/family education;Manual techniques;Passive range of motion;Dry needling;Taping;Vasopneumatic Device    PT Next Visit Plan STG assessment; core/lumboplevic and LE flexibility and strengthening - HEP updates as indicated; balance training; gait training to normalize gait pattern and improve stability    PT Home Exercise Plan Access Code: [email or web address] (8/4, updated 8/8)    Consulted and Agree with Plan of Care Patient;Family member/caregiver    Family Member Consulted wife             Patient will benefit from  skilled therapeutic intervention in order to improve the following deficits and impairments:  Abnormal gait, Decreased activity tolerance, Decreased balance, Decreased endurance, Decreased knowledge of precautions, Decreased knowledge of use of DME, Decreased mobility, Decreased range of motion, Decreased safety awareness, Decreased strength, Difficulty walking, Increased fascial restricitons, Increased muscle spasms, Impaired perceived functional ability, Impaired flexibility, Improper body mechanics, Postural dysfunction, Pain  Visit Diagnosis: Muscle weakness (generalized)  Unsteadiness on feet  Other abnormalities of gait and mobility  Difficulty in walking, not elsewhere classified  Chronic pain of right knee  Pain in right hip  Stiffness of right hip, not elsewhere classified     Problem List Patient Active Problem List   Diagnosis Date Noted   B12 deficiency 01/16/2021   Depression 01/16/2021   Hypoalbuminemia 01/16/2021   Generalized weakness 01/16/2021   Hypothyroidism (acquired) 01/13/2021   Altered mental status 47/65/4650   Acute metabolic encephalopathy 35/46/5681   Hypokalemia 01/09/2021   Hypomagnesemia 01/09/2021   Hypocalcemia 01/09/2021   Primary malignant neoplasm of kidney 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 to other site [facility]) 09/05/2020   Goals of care, counseling/discussion 09/05/2020   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 ([place]) 01/31/2020   History of right nephrectomy 08/13/2018   Renal mass, left 05/31/2018   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   Barrett's esophagus 08/30/2015   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    Percival Spanish, PT, MPT 03/07/2021, 5:04 PM  [facility] [phone]  [address]  [place], [place], [address] Phone: [phone]   Fax:  [phone]  Name: James Massey MRN: [id number] Date of Birth: 1946-02-04

## 2021-03-12 ENCOUNTER — Encounter: Payer: Self-pay | Admitting: Physical Therapy

## 2021-03-12 ENCOUNTER — Other Ambulatory Visit: Payer: Self-pay

## 2021-03-12 ENCOUNTER — Ambulatory Visit: Payer: Medicare Other | Admitting: Physical Therapy

## 2021-03-12 DIAGNOSIS — M6281 Muscle weakness (generalized): Secondary | ICD-10-CM

## 2021-03-12 DIAGNOSIS — M25561 Pain in right knee: Secondary | ICD-10-CM

## 2021-03-12 DIAGNOSIS — G8929 Other chronic pain: Secondary | ICD-10-CM

## 2021-03-12 DIAGNOSIS — M25651 Stiffness of right hip, not elsewhere classified: Secondary | ICD-10-CM

## 2021-03-12 DIAGNOSIS — R2681 Unsteadiness on feet: Secondary | ICD-10-CM

## 2021-03-12 DIAGNOSIS — R262 Difficulty in walking, not elsewhere classified: Secondary | ICD-10-CM

## 2021-03-12 DIAGNOSIS — R2689 Other abnormalities of gait and mobility: Secondary | ICD-10-CM

## 2021-03-12 DIAGNOSIS — M25551 Pain in right hip: Secondary | ICD-10-CM

## 2021-03-12 NOTE — Therapy (Signed)
[facility] [address]  [place], [place], [address] Phone: [phone]   Fax:  [phone]  Physical Therapy Treatment  Patient Details  Name: James Massey MRN: [id number] Date of Birth: 05-08-1946 Referring Provider (PT): Lawson Radar, PA-C   Encounter Date: 03/12/2021   PT End of Session - 03/12/21 1445     Visit Number 9    Number of Visits 16    Date for PT Re-Evaluation 04/02/21    Authorization Type Medicare & BCBS    PT Start Time 1840    PT Stop Time 1538    PT Time Calculation (min) 53 min    Activity Tolerance Patient tolerated treatment well    Behavior During Therapy WFL for tasks assessed/performed             Past Medical History:  Diagnosis Date   Anxiety    Barrett esophagus    GERD (gastroesophageal reflux disease)    Gout    Heart murmur    History of anemia    Kidney cancer, primary, 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 to other site [facility])    Mixed hyperlipidemia    Renal cell cancer, right ([place])    Right renal mass    Wears glasses     Past Surgical History:  Procedure Laterality Date   CYSTOSCOPY WITH RETROGRADE PYELOGRAM, URETEROSCOPY AND STENT PLACEMENT Right 05/21/2018   Procedure: RIGHT RETROGRADE PYELOGRAM, RIGHT DIAGNOSTIC URETEROSCOPY AND STENT PLACEMENT;  Surgeon: Ardis Hughs, MD;  Location: [facility];  Service: Urology;  Laterality: Right;   INGUINAL HERNIA REPAIR Bilateral 09/2012   LAPAROSCOPIC NEPHRECTOMY, HAND ASSISTED Right 05/31/2018   Procedure: LAPAROSCOPIC RADICAL RIGHT NEPHRECTOMY;  Surgeon: Ardis Hughs, MD;  Location: [facility];  Service: Urology;  Laterality: Right;    There were no vitals filed for this visit.   Subjective Assessment - 03/12/21 1453     Subjective Pt reports his hip pain had improved since last visit but tweeked it getting into the car to come to PT. He feels like the breathing exercises are comforting.    Patient is  accompained by: Family member   Wife & CNA   Currently in Pain? Yes    Pain Score 8     Pain Location Hip    Pain Orientation Right    Pain Descriptors / Indicators Sharp    Pain Type Chronic pain    Pain Frequency Constant                [facility] PT Assessment - 03/12/21 1445       Ambulation/Gait   Ambulation/Gait Assistance 5: Supervision;4: Min guard    Ambulation Distance (Feet) 120 Feet    Assistive device 4-wheeled walker;Rollator    Gait velocity 1.24 ft/sec      Standardized Balance Assessment   10 Meter Walk 26.47                           [facility] Adult PT Treatment/Exercise - 03/12/21 1445       Ambulation/Gait   Ambulation/Gait Assistance Details cues for upright posture and increased R hip extension - unable due to R hip pain    Gait Pattern Step-through pattern;Decreased weight shift to right;Decreased stance time - right;Decreased step length - left;Right flexed knee in stance;Decreased hip/knee flexion - right;Decreased hip/knee flexion - left;Trunk flexed   decreased R hip extension     Exercises  Exercises Knee/Hip      Knee/Hip Exercises: Stretches   Hip Flexor Stretch Right;2 reps;30 seconds    Hip Flexor Stretch Limitations seated with R leg lowered over edge of mat table & standing runner's stretch at counter   poor tolerance for both positions with limited stretch/benefit noted     Knee/Hip Exercises: Aerobic   Nustep L6 x 6 min (UE/LE)      Modalities   Modalities Moist Heat      Moist Heat Therapy   Number Minutes Moist Heat 10 Minutes    Moist Heat Location Hip      Manual Therapy   Manual Therapy Soft tissue mobilization    Soft tissue mobilization STM/DTM to R  hip flexors, TFL, hip adductors and proximal quads   limited tolerance today                   PT Education - 03/12/21 1538     Education Details Possibility of estim or TENS for R hip pain management if cleared by oncologist    Person(s) Educated  Patient;Spouse    Methods Explanation;Handout    Comprehension Verbalized understanding              PT Short Term Goals - 03/12/21 1458       PT SHORT TERM GOAL #1   Title Patient will be independent with initial HEP    Status Achieved   02/25/21     PT SHORT TERM GOAL #2   Title Patient will demonstrate safe transfer technique and proper gait pattern with 4-wheel rolling walker or LRAD as indicated    Status Partially Met   03/12/21 - improved safety awareness with transfers with rollator but increased R hip pain preventing good posture and adequate hip extension for step-through gait pattern   Target Date 03/05/21      PT SHORT TERM GOAL #3   Title Patient will increase gait speed to >/= 2.0 ft/sec with RW or LRAD to decrease risk for recurrent falls    Baseline 1.64 ft/sec with RW    Status On-going   03/12/21 - decreased to 1.24 ft/sec with rollator (pain limiting)   Target Date 03/05/21      PT SHORT TERM GOAL #4   Title Determine balance goals based on results of standardized balance testing    Status Achieved   02/11/21              PT Long Term Goals - 03/12/21 1538       PT LONG TERM GOAL #1   Title Patient will be independent with ongoing/advanced HEP for self-management at home in order to build upon functional gains in therapy    Status Partially Met    Target Date 04/02/21      PT LONG TERM GOAL #2   Title Decrease R hip and knee pain by >/= 50% during transitional movements allowing patient increased ease of mobiliy and transfers    Status On-going    Target Date 04/02/21      PT LONG TERM GOAL #3   Title Patient will demonstrate improved B LE strength to >/= 4+/5 for improved stability and ease of mobility    Status On-going    Target Date 04/02/21      PT LONG TERM GOAL #4   Title Patient will increase gait speed to >/= 2.62 ft/sec with rollator, SPC or LRAD to increase safety with community ambulation    Status On-going  Target Date 04/02/21       PT LONG TERM GOAL #5   Title Patient will demonstrate decreased TUG time to </= 18 sec with SPC or LRAD to decrease risk for falls with transitional mobility    Status On-going    Target Date 04/02/21      PT LONG TERM GOAL #6   Title Patient will improve Berg score to >/= 40/56 to improve safety stability with ADLs in standing and reduce risk for falls    Status On-going    Target Date 04/02/21                   Plan - 03/12/21 1538     Clinical Impression Statement Richard reports his R hip pain had been somewhat improved since his last visit until he "tweaked" his hip getting in the car to come to PT. Pain now 8/10 and limiting his ability to stand upright as well as preventing any hip extension, thus unable to achieve good stride length or step-through pattern with gait and significantly limiting his gait speed. As such, his remaining STGs remain only partially met or ongoing. Pt demonstrating limited tolerance for stretching or manual therapy today to address hip pain or muscle tightness. Given limited tolerance for therapeutic activities targeting pain reduction, discussed possibly of estim or TENS but requested he clear this with his oncologist as he has documented metastatic disease in his pelvis and R acetabulum from his metastatic renal cell carcinoma - educational materials provided.    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); R nephrectomy 2 cancer; OA; gout; GERD    Rehab Potential Good    PT Frequency 2x / week    PT Duration 8 weeks    PT Treatment/Interventions ADLs/Self Care Home Management;Cryotherapy;Moist Heat;DME Instruction;Gait training;Stair training;Functional mobility training;Therapeutic activities;Therapeutic exercise;Balance training;Neuromuscular re-education;Patient/family education;Manual techniques;Passive range of motion;Dry needling;Taping;Vasopneumatic Device;Electrical Stimulation;Iontophoresis 33m/ml  Dexamethasone    PT Next Visit Plan pain management as able for R hip pain PRN - manual therapy and modalities including possible estim or ionto if cleared by oncologist; core/lumboplevic and LE flexibility and strengthening - HEP updates as indicated; balance training; gait training to normalize gait pattern and improve stability    PT Home Exercise Plan Access Code: [email or web address] (8/4, updated 8/8)    Consulted and Agree with Plan of Care Patient;Family member/caregiver    Family Member Consulted wife - CHoyle Sauer            Patient will benefit from skilled therapeutic intervention in order to improve the following deficits and impairments:  Abnormal gait, Decreased activity tolerance, Decreased balance, Decreased endurance, Decreased knowledge of precautions, Decreased knowledge of use of DME, Decreased mobility, Decreased range of motion, Decreased safety awareness, Decreased strength, Difficulty walking, Increased fascial restricitons, Increased muscle spasms, Impaired perceived functional ability, Impaired flexibility, Improper body mechanics, Postural dysfunction, Pain  Visit Diagnosis: Muscle weakness (generalized)  Unsteadiness on feet  Other abnormalities of gait and mobility  Difficulty in walking, not elsewhere classified  Chronic pain of right knee  Pain in right hip  Stiffness of right hip, not elsewhere classified     Problem List Patient Active Problem List   Diagnosis Date Noted   B12 deficiency 01/16/2021   Depression 01/16/2021   Hypoalbuminemia 01/16/2021   Generalized weakness 01/16/2021   Hypothyroidism (acquired) 01/13/2021   Altered mental status 077/93/9030  Acute metabolic encephalopathy 009/23/3007  Hypokalemia 01/09/2021   Hypomagnesemia 01/09/2021  Hypocalcemia 01/09/2021   Primary malignant neoplasm of kidney 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 to other site [facility]) 09/05/2020   Goals of care, counseling/discussion 09/05/2020   Metastatic renal cell  carcinoma to bone ([place]) 01/31/2020   History of right nephrectomy 08/13/2018   Renal mass, left 05/31/2018   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   Barrett's esophagus 08/30/2015   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    Percival Spanish, PT, MPT 03/12/2021, 6:12 PM  [facility] [address]  [place], [place], [address] Phone: [phone]   Fax:  [phone]  Name: Duwane Gewirtz MRN: [id number] Date of Birth: 01-17-1946

## 2021-03-14 ENCOUNTER — Other Ambulatory Visit: Payer: Self-pay

## 2021-03-14 ENCOUNTER — Ambulatory Visit: Payer: Medicare Other | Admitting: Physical Therapy

## 2021-03-14 ENCOUNTER — Encounter: Payer: Self-pay | Admitting: Physical Therapy

## 2021-03-14 DIAGNOSIS — R262 Difficulty in walking, not elsewhere classified: Secondary | ICD-10-CM

## 2021-03-14 DIAGNOSIS — M6281 Muscle weakness (generalized): Secondary | ICD-10-CM

## 2021-03-14 DIAGNOSIS — R2689 Other abnormalities of gait and mobility: Secondary | ICD-10-CM

## 2021-03-14 DIAGNOSIS — G8929 Other chronic pain: Secondary | ICD-10-CM

## 2021-03-14 DIAGNOSIS — R2681 Unsteadiness on feet: Secondary | ICD-10-CM

## 2021-03-14 DIAGNOSIS — M25651 Stiffness of right hip, not elsewhere classified: Secondary | ICD-10-CM

## 2021-03-14 DIAGNOSIS — M25551 Pain in right hip: Secondary | ICD-10-CM

## 2021-03-14 NOTE — Patient Instructions (Signed)

## 2021-03-14 NOTE — Therapy (Signed)
[facility] [address]  [place], [place], [address] Phone: [phone]   Fax:  [phone]  Physical Therapy Treatment / Progress Note  Patient Details  Name: James Massey MRN: [id number] Date of Birth: Dec 25, 1945 Referring Provider (PT): Lawson Radar, PA-C  Progress Note  Reporting Period 02/05/2021 to 03/14/2021  See note below for Objective Data and Assessment of Progress/Goals.     Encounter Date: 03/14/2021   PT End of Session - 03/14/21 1448     Visit Number 10    Number of Visits 16    Date for PT Re-Evaluation 04/02/21    Authorization Type Medicare & BCBS    PT Start Time 8453    PT Stop Time 1549    PT Time Calculation (min) 61 min    Activity Tolerance Patient tolerated treatment well    Behavior During Therapy WFL for tasks assessed/performed             Past Medical History:  Diagnosis Date   Anxiety    Barrett esophagus    GERD (gastroesophageal reflux disease)    Gout    Heart murmur    History of anemia    Kidney cancer, primary, 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 to other site [facility])    Mixed hyperlipidemia    Renal cell cancer, right ([place])    Right renal mass    Wears glasses     Past Surgical History:  Procedure Laterality Date   CYSTOSCOPY WITH RETROGRADE PYELOGRAM, URETEROSCOPY AND STENT PLACEMENT Right 05/21/2018   Procedure: RIGHT RETROGRADE PYELOGRAM, RIGHT DIAGNOSTIC URETEROSCOPY AND STENT PLACEMENT;  Surgeon: Ardis Hughs, MD;  [facility];  Service: Urology;  Laterality: Right;   INGUINAL HERNIA REPAIR Bilateral 09/2012   LAPAROSCOPIC NEPHRECTOMY, HAND ASSISTED Right 05/31/2018   Procedure: LAPAROSCOPIC RADICAL RIGHT NEPHRECTOMY;  Surgeon: Ardis Hughs, MD;  Location: [facility];  Service: Urology;  Laterality: Right;    There were no vitals filed for this visit.   Subjective Assessment - 03/14/21 1453     Subjective Pt's wife reports he tried  sleeping with the adjustable bed out straight last night and slept a little better. Pain still elevated.    Patient is accompained by: Family member   Wife   Currently in Pain? Yes    Pain Score 8                 [facility] PT Assessment - 03/14/21 1448       Assessment   Medical Diagnosis Deconditioning    Referring Provider (PT) Lawson Radar, PA-C    Onset Date/Surgical Date --   hospitalized in July but declining for a few months prior   Next MD Visit 03/18/21 with Rita Ohara, MD (PCP); 03/22/21 with [name], MD      Strength   Overall Strength Comments strength testing completed in sitting d/t increased R hip pain - pt unable to tolerate resistance for R hip MMT    Left Hip Flexion 4+/5    Left Hip Extension 4/5    Left Hip External Rotation 4+/5    Left Hip Internal Rotation 4+/5    Left Hip ABduction 4+/5    Left Hip ADduction 4+/5    Right Knee Flexion 4/5    Right Knee Extension 4/5    Left Knee Flexion 4+/5    Left Knee Extension 4+/5    Right Ankle Dorsiflexion 4-/5    Left Ankle Dorsiflexion  4+/5      Ambulation/Gait   Ambulation/Gait Assistance 5: Supervision;4: Min guard    Ambulation Distance (Feet) 120 Feet    Assistive device 4-wheeled walker;Rollator    Gait Pattern Step-through pattern;Decreased weight shift to right;Decreased stance time - right;Decreased step length - left;Right flexed knee in stance;Decreased hip/knee flexion - right;Decreased hip/knee flexion - left;Trunk flexed   decreased R hip extension   Gait velocity 1.24 ft/sec                           [facility] Adult PT Treatment/Exercise - 03/14/21 1448       Knee/Hip Exercises: Aerobic   Nustep L6 x 6 min (UE/LE)      Modalities   Modalities Electrical Stimulation;Moist Heat      Moist Heat Therapy   Number Minutes Moist Heat 15 Minutes    Moist Heat Location Hip   Rt     Electrical Stimulation   Electrical Stimulation Location R anterolateral hip    Electrical  Stimulation Action TENS    Electrical Stimulation Parameters SD1 mode, intensity to pt tolerance x 15'    Electrical Stimulation Goals Pain;Tone                     PT Education - 03/14/21 1525     Education Details Role of estim/TENS for pain management; set-up and use or recommended home TENS unit    Person(s) Educated Patient;Spouse    Methods Explanation;Demonstration;Verbal cues;Handout    Comprehension Verbalized understanding;Need further instruction              PT Short Term Goals - 03/14/21 1505       PT SHORT TERM GOAL #1   Title Patient will be independent with initial HEP    Status Achieved   02/25/21     PT SHORT TERM GOAL #2   Title Patient will demonstrate safe transfer technique and proper gait pattern with 4-wheel rolling walker or LRAD as indicated    Status Partially Met   03/12/21 - improved safety awareness with transfers with rollator but increased R hip pain preventing good posture and adequate hip extension for step-through gait pattern   Target Date 03/05/21      PT SHORT TERM GOAL #3   Title Patient will increase gait speed to >/= 2.0 ft/sec with RW or LRAD to decrease risk for recurrent falls    Baseline 1.64 ft/sec with RW    Status On-going   03/12/21 - decreased to 1.24 ft/sec with rollator (pain limiting)   Target Date 03/05/21      PT SHORT TERM GOAL #4   Title Determine balance goals based on results of standardized balance testing    Status Achieved   02/11/21              PT Long Term Goals - 03/14/21 1506       PT LONG TERM GOAL #1   Title Patient will be independent with ongoing/advanced HEP for self-management at home in order to build upon functional gains in therapy    Status Partially Met    Target Date 04/02/21      PT LONG TERM GOAL #2   Title Decrease R hip and knee pain by >/= 50% during transitional movements allowing patient increased ease of mobiliy and transfers    Status On-going    Target Date 04/02/21       PT LONG TERM GOAL #  3   Title Patient will demonstrate improved B LE strength to >/= 4+/5 for improved stability and ease of mobility    Status Partially Met    Target Date 04/02/21      PT LONG TERM GOAL #4   Title Patient will increase gait speed to >/= 2.62 ft/sec with rollator, SPC or LRAD to increase safety with community ambulation    Status On-going    Target Date 04/02/21      PT LONG TERM GOAL #5   Title Patient will demonstrate decreased TUG time to </= 18 sec with SPC or LRAD to decrease risk for falls with transitional mobility    Status On-going    Target Date 04/02/21      PT LONG TERM GOAL #6   Title Patient will improve Berg score to >/= 40/56 to improve safety stability with ADLs in standing and reduce risk for falls    Status On-going    Target Date 04/02/21                   Plan - 03/14/21 1506     Clinical Impression Statement James Massey has been reporting significant increase in R hip pain for >1 week w/o known physical trigger. Pain has significantly limited therapy tolerance for the past 3 visits as well as his ability to transition sit to/from stand and walk. R hip pain has caused him to revert to a flexed trunk posture with gait with R LE remaining flexed in stance and limited hip extension ROM preventing step-through gait pattern, hence resulting in decreased gait speed to 1.24 ft/sec from 1.64 ft/sec on eval. Prior to the flare-up of his R hip pain, James Massey was demonstrating increased ease of transfers and improving gait pattern and speed. His overall LE strength has improved with exception of inability to assess R hip strength due to increased pain with attempts at resisted motion. Pain also preventing reassessment of balance testing at this time. STG met but progress towards LTGs more limited due to above pain issues. Clearance received from pt's oncologist to try TENS for management of R hip pain despite presence of metastatic disease in the pelvis  and acetabulum, therefore initiated trial of TENS today with pt noting reduction in pain by 1/3rd. Pt provided with information on how to obtain a home TENS unit. James Massey will continue to benefit from skilled PT to for further strengthening, mobility, gait and balance training if pain can be managed to a level where he can tolerate therapeutic exercises and activities.    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); R nephrectomy 2 cancer; OA; gout; GERD    Rehab Potential Good    PT Frequency 2x / week    PT Duration 8 weeks    PT Treatment/Interventions ADLs/Self Care Home Management;Cryotherapy;Moist Heat;DME Instruction;Gait training;Stair training;Functional mobility training;Therapeutic activities;Therapeutic exercise;Balance training;Neuromuscular re-education;Patient/family education;Manual techniques;Passive range of motion;Dry needling;Taping;Vasopneumatic Device;Electrical Stimulation;Iontophoresis 38m/ml Dexamethasone    PT Next Visit Plan pain management as able for R hip pain PRN - manual therapy and modalities including estim or possible ionto; core/lumboplevic and LE flexibility and strengthening - HEP updates as indicated; balance training; gait training to normalize gait pattern and improve stability    PT Home Exercise Plan Access Code: [email or web address] (8/4, updated 8/8)    Consulted and Agree with Plan of Care Patient;Family member/caregiver    Family Member Consulted wife - James Massey  Patient will benefit from skilled therapeutic intervention in order to improve the following deficits and impairments:  Abnormal gait, Decreased activity tolerance, Decreased balance, Decreased endurance, Decreased knowledge of precautions, Decreased knowledge of use of DME, Decreased mobility, Decreased range of motion, Decreased safety awareness, Decreased strength, Difficulty walking, Increased fascial restricitons, Increased muscle spasms, Impaired  perceived functional ability, Impaired flexibility, Improper body mechanics, Postural dysfunction, Pain  Visit Diagnosis: Muscle weakness (generalized)  Unsteadiness on feet  Other abnormalities of gait and mobility  Difficulty in walking, not elsewhere classified  Chronic pain of right knee  Pain in right hip  Stiffness of right hip, not elsewhere classified     Problem List Patient Active Problem List   Diagnosis Date Noted   B12 deficiency 01/16/2021   Depression 01/16/2021   Hypoalbuminemia 01/16/2021   Generalized weakness 01/16/2021   Hypothyroidism (acquired) 01/13/2021   Altered mental status 51/70/0174   Acute metabolic encephalopathy 94/49/6759   Hypokalemia 01/09/2021   Hypomagnesemia 01/09/2021   Hypocalcemia 01/09/2021   Primary malignant neoplasm of kidney 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 to other site [facility]) 09/05/2020   Goals of care, counseling/discussion 09/05/2020   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 ([place]) 01/31/2020   History of right nephrectomy 08/13/2018   Renal mass, left 05/31/2018   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   Barrett's esophagus 08/30/2015   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

## 2021-03-17 ENCOUNTER — Other Ambulatory Visit: Payer: Self-pay | Admitting: Oncology

## 2021-03-18 ENCOUNTER — Telehealth: Payer: Self-pay | Admitting: Oncology

## 2021-03-18 ENCOUNTER — Encounter: Payer: Self-pay | Admitting: Oncology

## 2021-03-18 NOTE — Telephone Encounter (Signed)
Called patient regarding upcoming September appointments, patient is notified.  

## 2021-03-19 ENCOUNTER — Other Ambulatory Visit: Payer: Self-pay

## 2021-03-19 ENCOUNTER — Ambulatory Visit: Payer: Medicare Other | Admitting: Physical Therapy

## 2021-03-19 ENCOUNTER — Inpatient Hospital Stay: Payer: Medicare Other | Attending: Oncology

## 2021-03-19 ENCOUNTER — Ambulatory Visit (HOSPITAL_COMMUNITY)
Admission: RE | Admit: 2021-03-19 | Discharge: 2021-03-19 | Disposition: A | Discharge status: Home / Self Care | Payer: Medicare Other | Source: Ambulatory Visit | Attending: Oncology | Admitting: Oncology

## 2021-03-19 DIAGNOSIS — C649 Malignant neoplasm of unspecified kidney, except renal pelvis: Secondary | ICD-10-CM

## 2021-03-19 DIAGNOSIS — E039 Hypothyroidism, unspecified: Secondary | ICD-10-CM | POA: Insufficient documentation

## 2021-03-19 DIAGNOSIS — N2889 Other specified disorders of kidney and ureter: Secondary | ICD-10-CM | POA: Diagnosis present

## 2021-03-19 DIAGNOSIS — C7800 Secondary malignant neoplasm of unspecified lung: Secondary | ICD-10-CM | POA: Insufficient documentation

## 2021-03-19 DIAGNOSIS — Z923 Personal history of irradiation: Secondary | ICD-10-CM | POA: Insufficient documentation

## 2021-03-19 DIAGNOSIS — C641 Malignant neoplasm of right kidney, except renal pelvis: Secondary | ICD-10-CM | POA: Insufficient documentation

## 2021-03-19 DIAGNOSIS — R918 Other nonspecific abnormal finding of lung field: Secondary | ICD-10-CM | POA: Diagnosis present

## 2021-03-19 DIAGNOSIS — Z79899 Other long term (current) drug therapy: Secondary | ICD-10-CM | POA: Insufficient documentation

## 2021-03-19 DIAGNOSIS — I700 Atherosclerosis of aorta: Secondary | ICD-10-CM | POA: Insufficient documentation

## 2021-03-19 DIAGNOSIS — J439 Emphysema, unspecified: Secondary | ICD-10-CM | POA: Diagnosis not present

## 2021-03-19 DIAGNOSIS — C7951 Secondary malignant neoplasm of bone: Secondary | ICD-10-CM | POA: Insufficient documentation

## 2021-03-19 LAB — CBC WITH DIFFERENTIAL (CANCER CENTER ONLY)
Abs Immature Granulocytes: 0.08 10*3/uL — ABNORMAL HIGH (ref 0.00–0.07)
Basophils Absolute: 0.1 10*3/uL (ref 0.0–0.1)
Basophils Relative: 1 %
Eosinophils Absolute: 0.2 10*3/uL (ref 0.0–0.5)
Eosinophils Relative: 2 %
HCT: 37.3 % — ABNORMAL LOW (ref 39.0–52.0)
Hemoglobin: 12.3 g/dL — ABNORMAL LOW (ref 13.0–17.0)
Immature Granulocytes: 1 %
Lymphocytes Relative: 7 %
Lymphs Abs: 0.7 10*3/uL (ref 0.7–4.0)
MCH: 33.3 pg (ref 26.0–34.0)
MCHC: 33 g/dL (ref 30.0–36.0)
MCV: 101.1 fL — ABNORMAL HIGH (ref 80.0–100.0)
Monocytes Absolute: 0.8 10*3/uL (ref 0.1–1.0)
Monocytes Relative: 7 %
Neutro Abs: 8.8 10*3/uL — ABNORMAL HIGH (ref 1.7–7.7)
Neutrophils Relative %: 82 %
Platelet Count: 322 10*3/uL (ref 150–400)
RBC: 3.69 MIL/uL — ABNORMAL LOW (ref 4.22–5.81)
RDW: 13 % (ref 11.5–15.5)
WBC Count: 10.6 10*3/uL — ABNORMAL HIGH (ref 4.0–10.5)
nRBC: 0 % (ref 0.0–0.2)

## 2021-03-19 LAB — CMP (CANCER CENTER ONLY)
ALT: 31 U/L (ref 0–44)
AST: 24 U/L (ref 15–41)
Albumin: 3.3 g/dL — ABNORMAL LOW (ref 3.5–5.0)
Alkaline Phosphatase: 82 U/L (ref 38–126)
Anion gap: 10 (ref 5–15)
BUN: 19 mg/dL (ref 8–23)
CO2: 26 mmol/L (ref 22–32)
Calcium: 9.9 mg/dL (ref 8.9–10.3)
Chloride: 100 mmol/L (ref 98–111)
Creatinine: 1.39 mg/dL — ABNORMAL HIGH (ref 0.61–1.24)
GFR, Estimated: 53 mL/min — ABNORMAL LOW (ref >60)
Glucose, Bld: 103 mg/dL — ABNORMAL HIGH (ref 70–99)
Potassium: 4.4 mmol/L (ref 3.5–5.1)
Sodium: 136 mmol/L (ref 135–145)
Total Bilirubin: 0.5 mg/dL (ref 0.3–1.2)
Total Protein: 7 g/dL (ref 6.5–8.1)

## 2021-03-19 LAB — TSH: TSH: 4.737 u[IU]/mL — ABNORMAL HIGH (ref 0.350–4.500)

## 2021-03-19 IMAGING — CT CT ABD-PEL WO/W CM
2 of 13 series · 9 of 46 positions shown, 15 images · IV contrast (omnipaque)
Comparison: [DATE]

CLINICAL DATA: Right renal cancer with metastatic disease.
Restaging.

EXAM:
CT CHEST WITH CONTRAST
CT ABDOMEN AND PELVIS WITH AND WITHOUT CONTRAST
TECHNIQUE: Multidetector CT imaging of the chest was performed during
intravenous contrast administration. Multidetector CT imaging of the
abdomen and pelvis was performed following the standard protocol
before and during bolus administration of intravenous contrast.
CONTRAST:  80mL OMNIPAQUE IOHEXOL 350 MG/ML SOLN

[Series 4: coronal pre · coronal · non-contrast · 0.47mm/px · 2 of 101 slices shown, 3 images]
[im 34/101  soft-tissue]
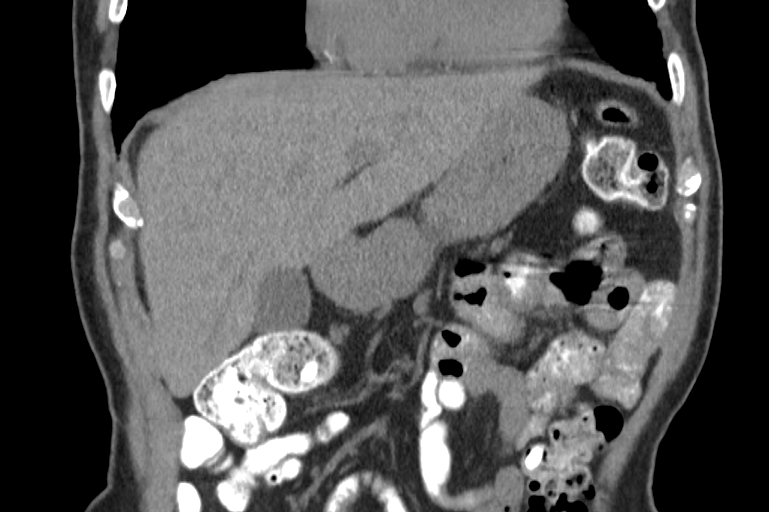
[im 34/101  bone]
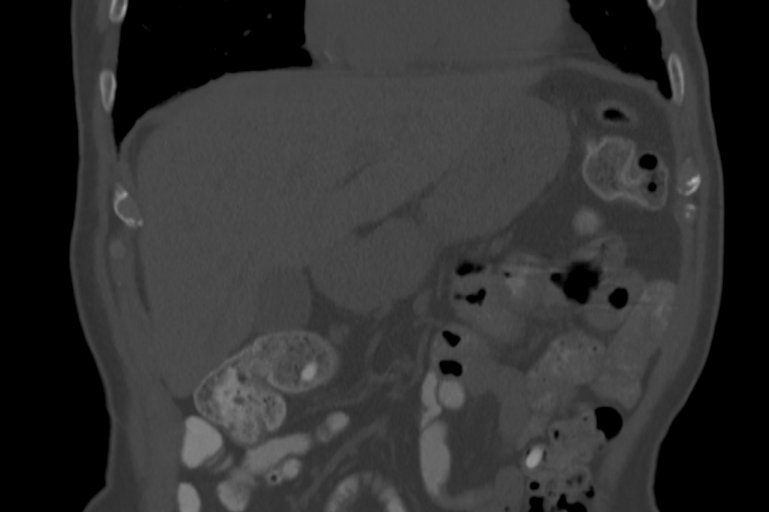
[im 67/101  soft-tissue]
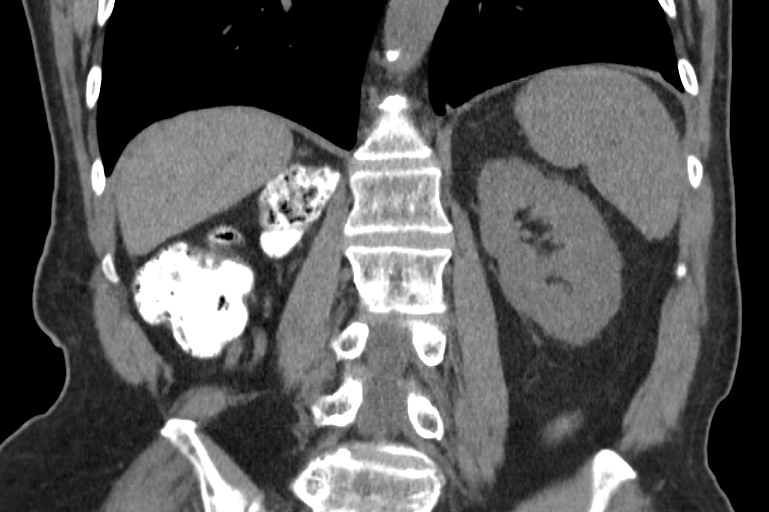

[Series 11: axial nephro · axial · 0.81mm/px · z∈[+930,+1257]mm · 7 of 147 slices shown, 12 images]
[im 19/147  soft-tissue]
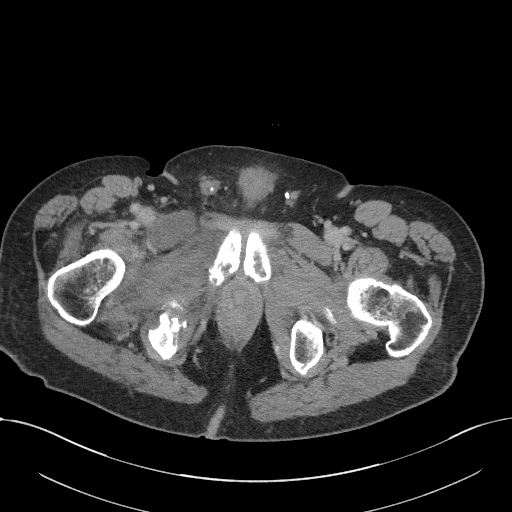
[im 19/147  bone]
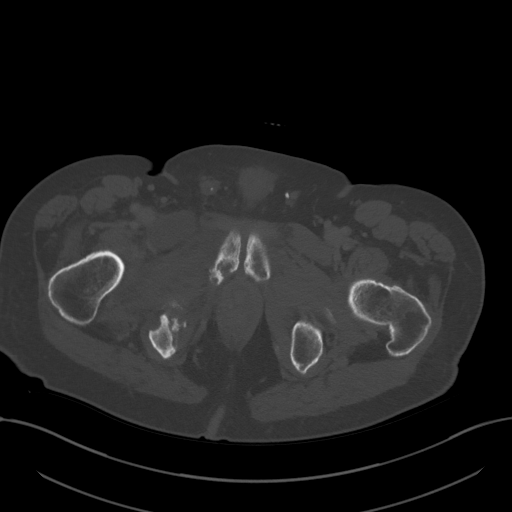
[im 37/147  soft-tissue]
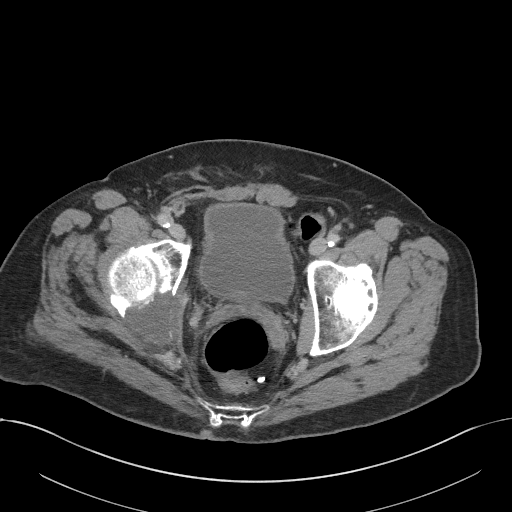
[im 55/147  soft-tissue]
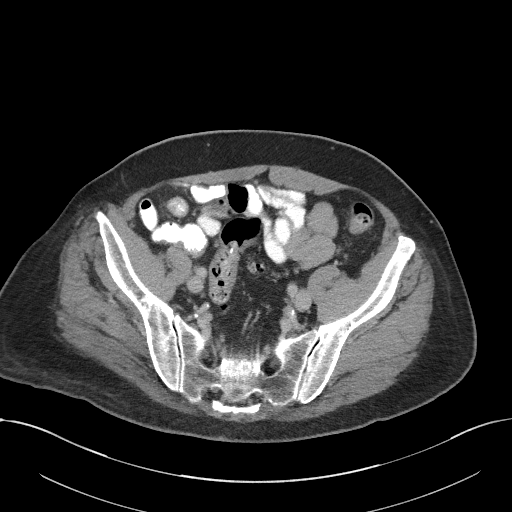
[im 74/147  soft-tissue]
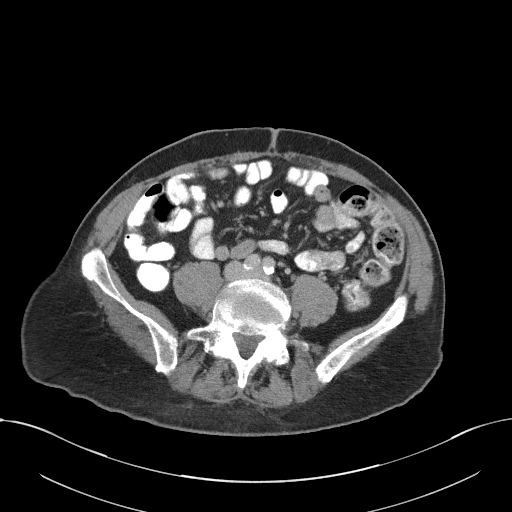
[im 74/147  lung]
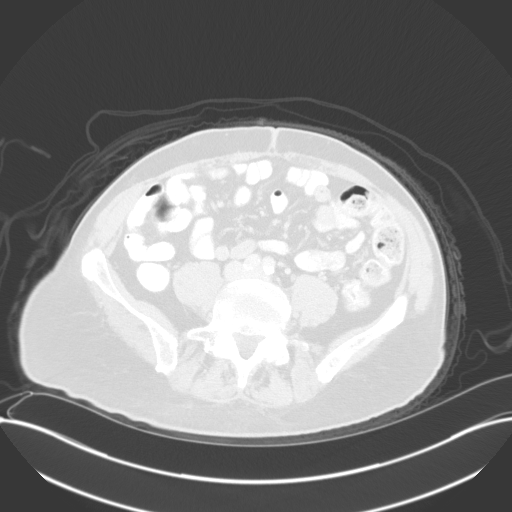
[im 92/147  soft-tissue]
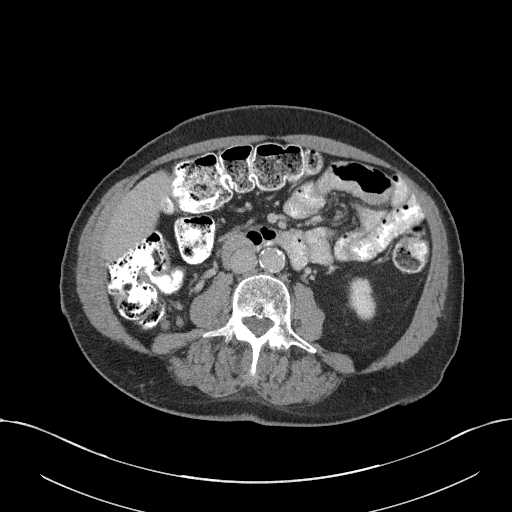
[im 92/147  lung]
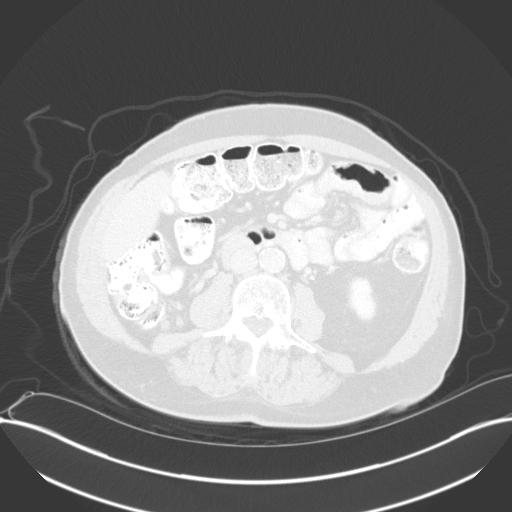
[im 110/147  soft-tissue]
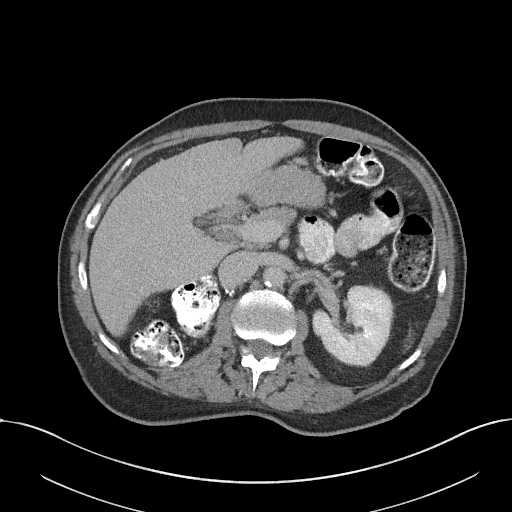
[im 110/147  lung]
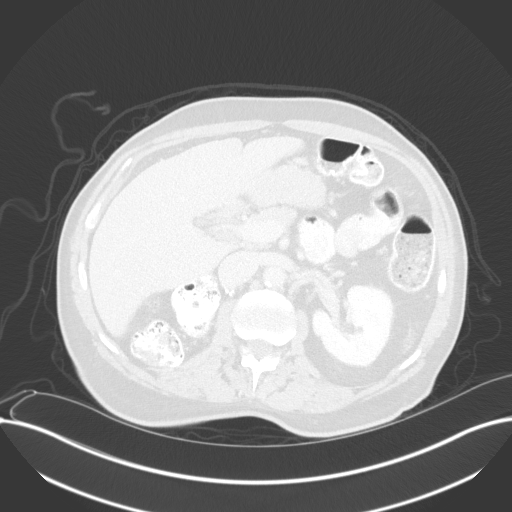
[im 128/147  soft-tissue]
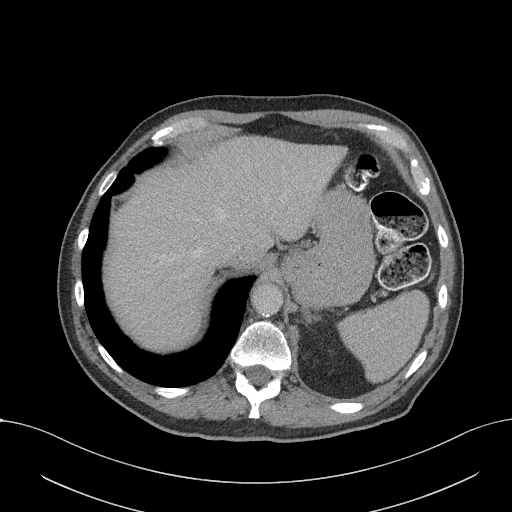
[im 128/147  lung]
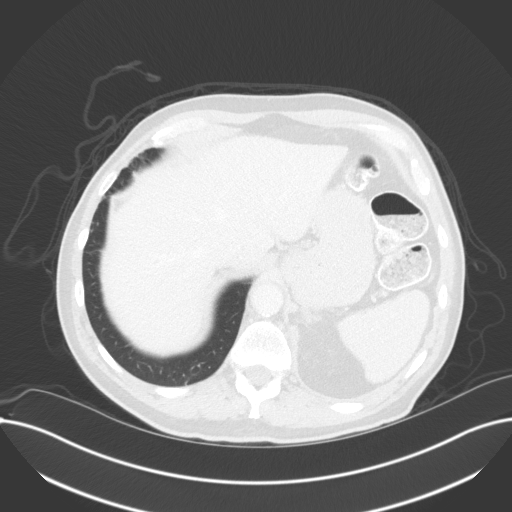

[9 of 46 positions shown; findings below may reference images not displayed]

FINDINGS: CT CHEST FINDINGS

Cardiovascular: The heart size is normal. No substantial pericardial
effusion. Coronary artery calcification is evident. Moderate
atherosclerotic calcification is noted in the wall of the thoracic
aorta.

Mediastinum/Nodes: No mediastinal lymphadenopathy. There is no hilar
lymphadenopathy. The esophagus has normal imaging features. There is
no axillary lymphadenopathy.

Lungs/Pleura: Centrilobular emphsyema noted. Biapical
pleuroparenchymal scarring is similar to prior. Scattered areas of
architectural distortion are noted in the lungs bilaterally.
Peribronchovascular nodularity in the right middle lobe is similar,
likely related to infectious/inflammatory etiology. Slight interval
progression of peripheral nodularity in the right lung base with a
peribronchovascular distribution in some regions. 10 mm nodular
Stable scarring posterior left costophrenic sulcus in the posterior
lingula. No focal airspace consolidation. No pleural effusion.

Musculoskeletal: 15 mm mixed lytic and sclerotic lesion in the right
aspect of the T3 vertebral body is stable in size and increased
mineralization in this lesion on today's study suggests interval
healing.Mixed lytic and sclerotic lesion in the medial left scapula
is similar to prior.

CT ABDOMEN AND PELVIS FINDINGS

Hepatobiliary: No suspicious focal abnormality within the liver
parenchyma. There is no evidence for gallstones, gallbladder wall
thickening, or pericholecystic fluid. No intrahepatic or
extrahepatic biliary dilation.

Pancreas: No focal mass lesion. No dilatation of the main duct. No
intraparenchymal cyst. No peripancreatic edema.

Spleen: No splenomegaly. No focal mass lesion.

Adrenals/Urinary Tract: No adrenal nodule or mass. Right kidney
surgically absent. Left kidney unremarkable. Left ureter
unremarkable. The urinary bladder appears normal for the degree of
distention.

Stomach/Bowel: Stomach is decompressed. Duodenum is normally
positioned as is the ligament of Treitz. No small bowel wall
thickening. No small bowel dilatation. The terminal ileum is normal.
The appendix is normal. No gross colonic mass. No colonic wall
thickening. Diverticular changes are noted in the left colon without
evidence of diverticulitis.

Vascular/Lymphatic: Insert calcium aorta There is no gastrohepatic
or hepatoduodenal ligament lymphadenopathy. No retroperitoneal or
mesenteric lymphadenopathy.

Reproductive: The prostate gland and seminal vesicles are
unremarkable.

Other: No intraperitoneal free fluid.

Musculoskeletal: Similar appearance soft tissue mass posterior right
acetabulum measuring 4.0 x 4.0 cm today compared to 4.1 x 3.7 cm
previously. Large inferior pubic ramus lesion persists and is also
similar in appearance. Edema within the adductor muscles of the
medial right thigh again noted. No new suspicious lytic or sclerotic
osseous abnormality.
IMPRESSION: 1. Interval progression of peripheral nodularity in the right lung
base with a peribronchovascular distribution in some regions.
Imaging features likely reflect infectious/inflammatory etiology and
atypical etiology should be considered.
2. Scattered new tiny pulmonary nodules in the right lung,
nonspecific but attention on follow-up recommended.
3. Stable appearance of probable scarring posterior left
costophrenic sulcus.
4. Dominant destructive bone lesions of the right hemipelvis in left
scapula are stable.
5. Interval increase in mineralization of the T3 vertebral body
lesion suggesting interval healing.
6. Aortic Atherosclerosis ([EW]-[EW]) and Emphysema ([EW]-[EW]).

## 2021-03-19 IMAGING — CT CT CHEST W/ CM
2 of 4 series · 15 of 46 positions shown, 17 images · IV contrast (APPLIED)
Comparison: [DATE]

CLINICAL DATA: Right renal cancer with metastatic disease.
Restaging.

EXAM:
CT CHEST WITH CONTRAST
CT ABDOMEN AND PELVIS WITH AND WITHOUT CONTRAST
TECHNIQUE: Multidetector CT imaging of the chest was performed during
intravenous contrast administration. Multidetector CT imaging of the
abdomen and pelvis was performed following the standard protocol
before and during bolus administration of intravenous contrast.
CONTRAST:  80mL OMNIPAQUE IOHEXOL 350 MG/ML SOLN

[Series 2: axial st · axial · 0.70mm/px · z∈[+1231,+1507]mm · 12 of 160 slices shown, 14 images]
[im 11/160  soft-tissue]
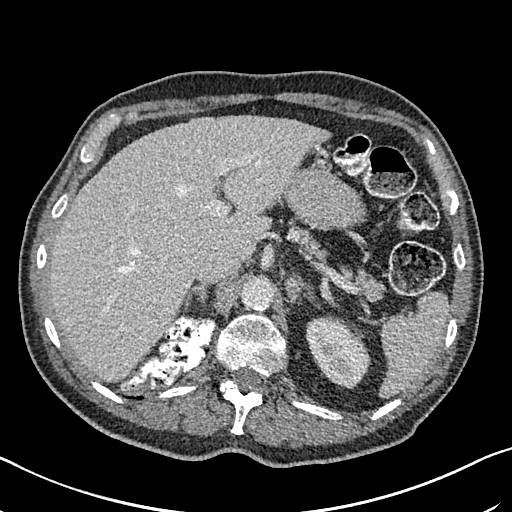
[im 11/160  bone]
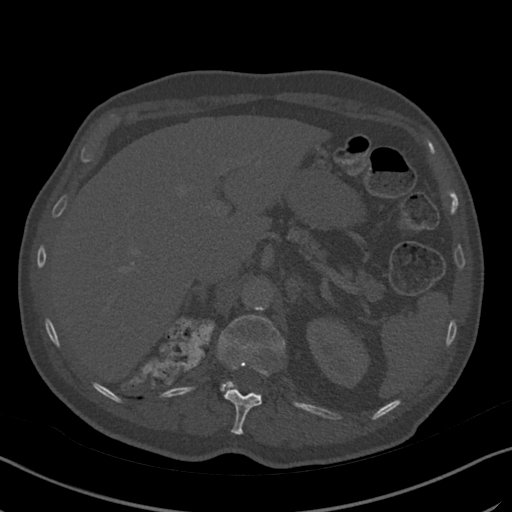
[im 22/160  soft-tissue]
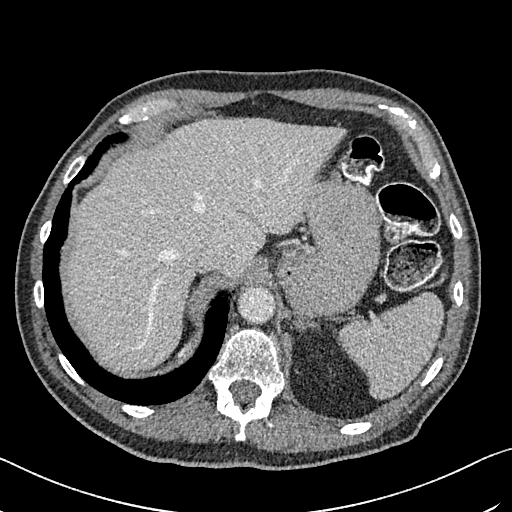
[im 32/160  soft-tissue]
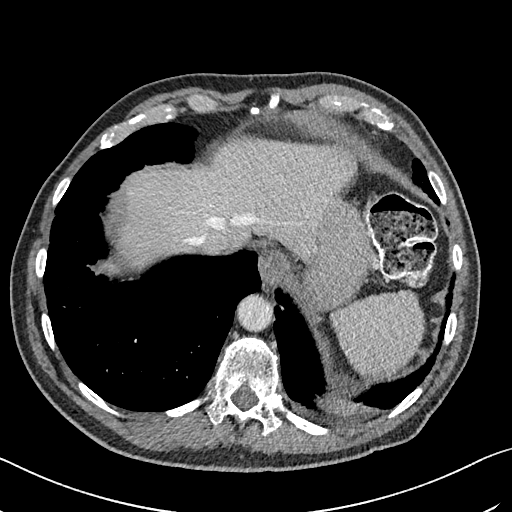
[im 54/160  soft-tissue]
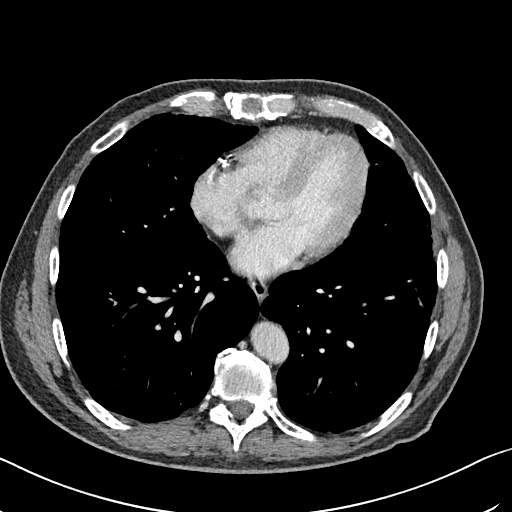
[im 64/160  soft-tissue]
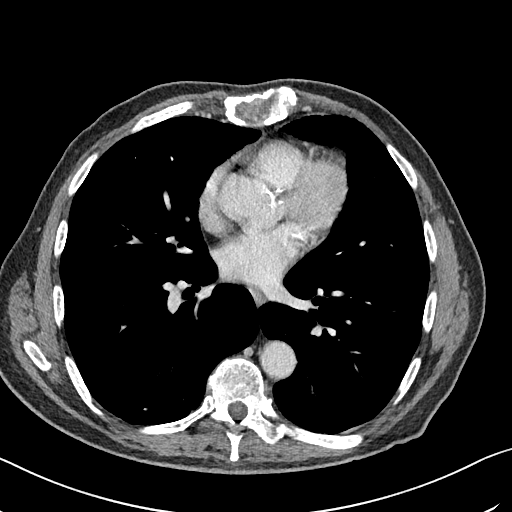
[im 75/160  soft-tissue]
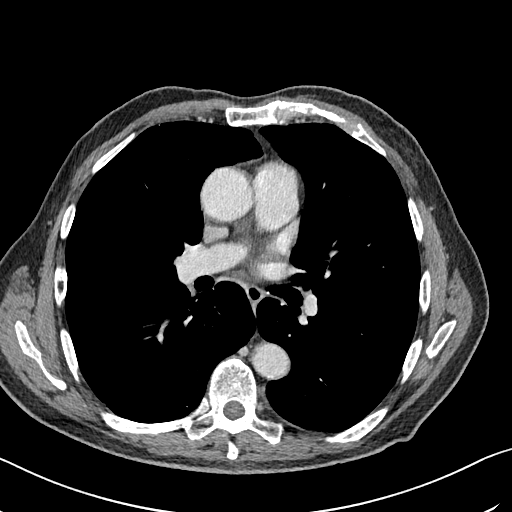
[im 85/160  soft-tissue]
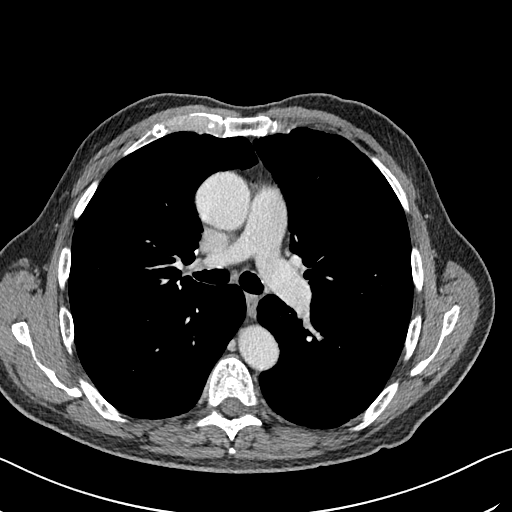
[im 96/160  soft-tissue]
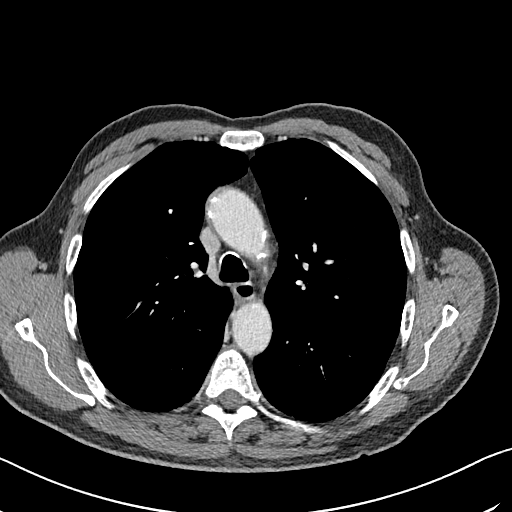
[im 107/160  soft-tissue]
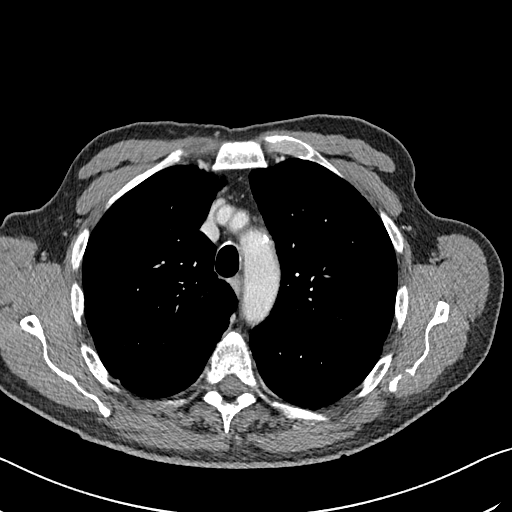
[im 107/160  bone]
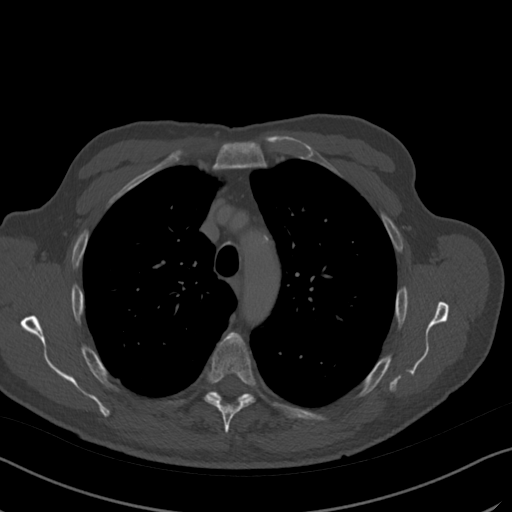
[im 128/160  soft-tissue]
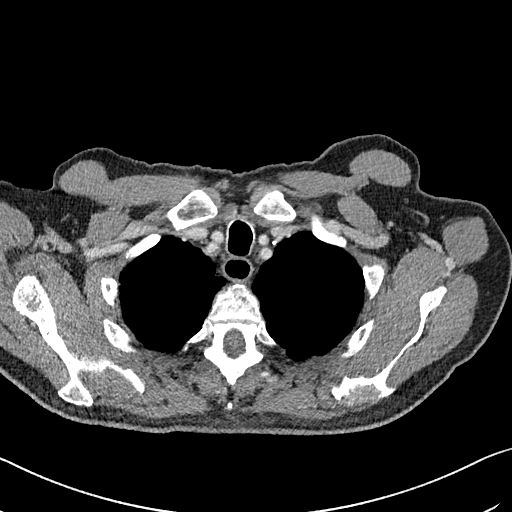
[im 138/160  soft-tissue]
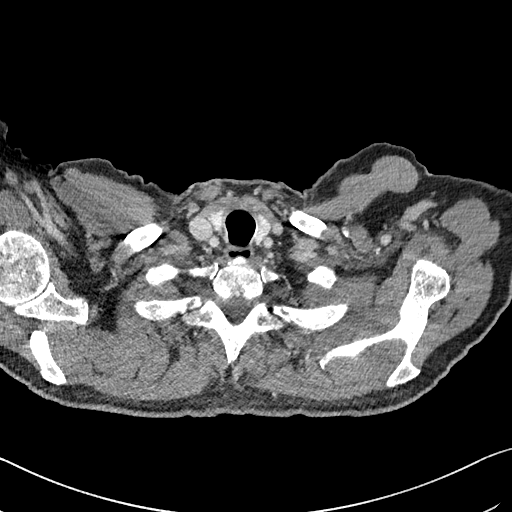
[im 149/160  soft-tissue]
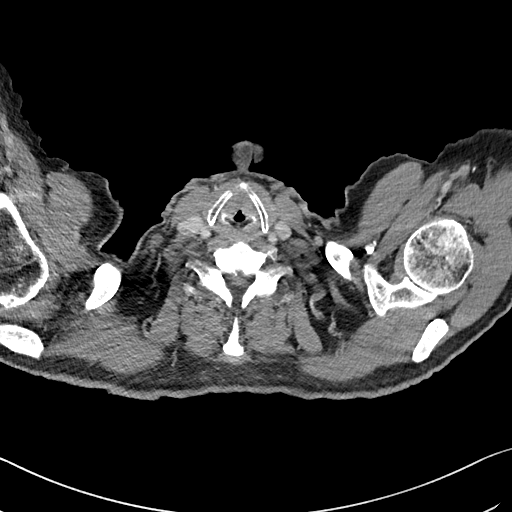

[Series 6: coronal · coronal · 0.64mm/px · 3 of 137 slices shown]
[im 46/137  soft-tissue]
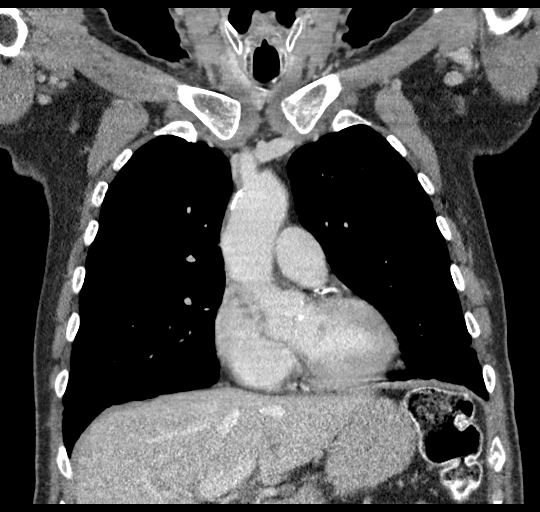
[im 61/137  soft-tissue]
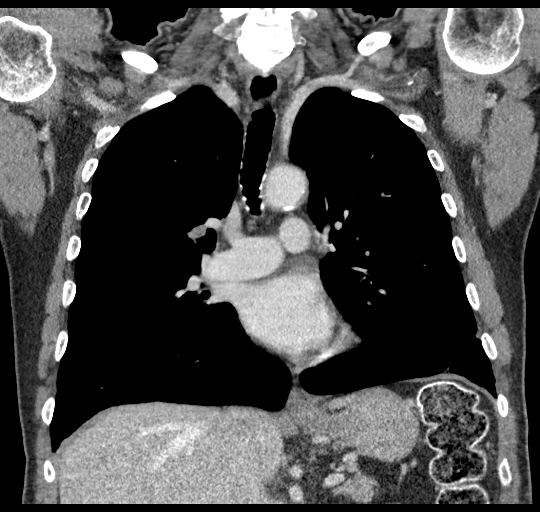
[im 76/137  soft-tissue]
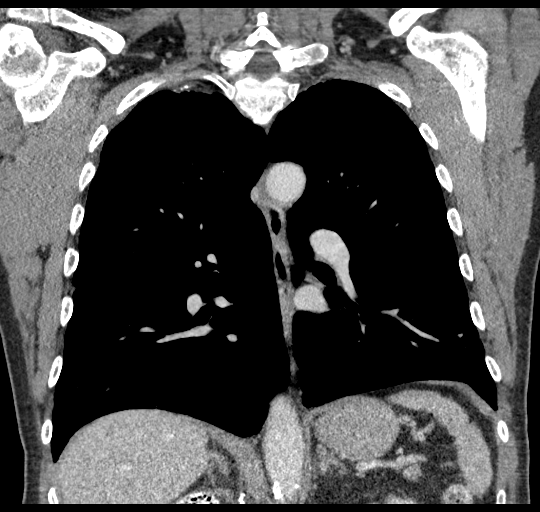

[15 of 46 positions shown; findings below may reference images not displayed]

FINDINGS: CT CHEST FINDINGS

Cardiovascular: The heart size is normal. No substantial pericardial
effusion. Coronary artery calcification is evident. Moderate
atherosclerotic calcification is noted in the wall of the thoracic
aorta.

Mediastinum/Nodes: No mediastinal lymphadenopathy. There is no hilar
lymphadenopathy. The esophagus has normal imaging features. There is
no axillary lymphadenopathy.

Lungs/Pleura: Centrilobular emphsyema noted. Biapical
pleuroparenchymal scarring is similar to prior. Scattered areas of
architectural distortion are noted in the lungs bilaterally.
Peribronchovascular nodularity in the right middle lobe is similar,
likely related to infectious/inflammatory etiology. Slight interval
progression of peripheral nodularity in the right lung base with a
peribronchovascular distribution in some regions. 10 mm nodular
Stable scarring posterior left costophrenic sulcus in the posterior
lingula. No focal airspace consolidation. No pleural effusion.

Musculoskeletal: 15 mm mixed lytic and sclerotic lesion in the right
aspect of the T3 vertebral body is stable in size and increased
mineralization in this lesion on today's study suggests interval
healing.Mixed lytic and sclerotic lesion in the medial left scapula
is similar to prior.

CT ABDOMEN AND PELVIS FINDINGS

Hepatobiliary: No suspicious focal abnormality within the liver
parenchyma. There is no evidence for gallstones, gallbladder wall
thickening, or pericholecystic fluid. No intrahepatic or
extrahepatic biliary dilation.

Pancreas: No focal mass lesion. No dilatation of the main duct. No
intraparenchymal cyst. No peripancreatic edema.

Spleen: No splenomegaly. No focal mass lesion.

Adrenals/Urinary Tract: No adrenal nodule or mass. Right kidney
surgically absent. Left kidney unremarkable. Left ureter
unremarkable. The urinary bladder appears normal for the degree of
distention.

Stomach/Bowel: Stomach is decompressed. Duodenum is normally
positioned as is the ligament of Treitz. No small bowel wall
thickening. No small bowel dilatation. The terminal ileum is normal.
The appendix is normal. No gross colonic mass. No colonic wall
thickening. Diverticular changes are noted in the left colon without
evidence of diverticulitis.

Vascular/Lymphatic: Insert calcium aorta There is no gastrohepatic
or hepatoduodenal ligament lymphadenopathy. No retroperitoneal or
mesenteric lymphadenopathy.

Reproductive: The prostate gland and seminal vesicles are
unremarkable.

Other: No intraperitoneal free fluid.

Musculoskeletal: Similar appearance soft tissue mass posterior right
acetabulum measuring 4.0 x 4.0 cm today compared to 4.1 x 3.7 cm
previously. Large inferior pubic ramus lesion persists and is also
similar in appearance. Edema within the adductor muscles of the
medial right thigh again noted. No new suspicious lytic or sclerotic
osseous abnormality.
IMPRESSION: 1. Interval progression of peripheral nodularity in the right lung
base with a peribronchovascular distribution in some regions.
Imaging features likely reflect infectious/inflammatory etiology and
atypical etiology should be considered.
2. Scattered new tiny pulmonary nodules in the right lung,
nonspecific but attention on follow-up recommended.
3. Stable appearance of probable scarring posterior left
costophrenic sulcus.
4. Dominant destructive bone lesions of the right hemipelvis in left
scapula are stable.
5. Interval increase in mineralization of the T3 vertebral body
lesion suggesting interval healing.
6. Aortic Atherosclerosis ([EW]-[EW]) and Emphysema ([EW]-[EW]).

## 2021-03-19 MED ORDER — IOHEXOL 350 MG/ML SOLN
100.0000 mL | Freq: Once | INTRAVENOUS | Status: AC | PRN
  Administered 2021-03-19: 80 mL via INTRAVENOUS

## 2021-03-22 ENCOUNTER — Other Ambulatory Visit: Payer: Self-pay

## 2021-03-22 ENCOUNTER — Inpatient Hospital Stay (HOSPITAL_BASED_OUTPATIENT_CLINIC_OR_DEPARTMENT_OTHER): Payer: Medicare Other | Admitting: Oncology

## 2021-03-22 VITALS — BP 114/56 | HR 100 | Temp 98.3°F | Resp 18 | Ht 68.0 in | Wt 177.6 lb

## 2021-03-22 DIAGNOSIS — C649 Malignant neoplasm of unspecified kidney, except renal pelvis: Secondary | ICD-10-CM | POA: Diagnosis not present

## 2021-03-22 DIAGNOSIS — C7951 Secondary malignant neoplasm of bone: Secondary | ICD-10-CM | POA: Diagnosis present

## 2021-03-22 DIAGNOSIS — C641 Malignant neoplasm of right kidney, except renal pelvis: Secondary | ICD-10-CM | POA: Diagnosis not present

## 2021-03-22 DIAGNOSIS — E039 Hypothyroidism, unspecified: Secondary | ICD-10-CM | POA: Diagnosis not present

## 2021-03-22 DIAGNOSIS — C7800 Secondary malignant neoplasm of unspecified lung: Secondary | ICD-10-CM | POA: Diagnosis not present

## 2021-03-22 DIAGNOSIS — Z923 Personal history of irradiation: Secondary | ICD-10-CM | POA: Diagnosis not present

## 2021-03-22 DIAGNOSIS — Z79899 Other long term (current) drug therapy: Secondary | ICD-10-CM | POA: Diagnosis not present

## 2021-03-22 NOTE — Progress Notes (Signed)
Hematology and Oncology Follow Up Visit  James Massey [id number] Feb 18, 1946 75 y.o. 03/22/2021 3:32 PM James Massey, MDShadad, James Dad, MD   Principle Diagnosis: 75 year old man with kidney cancer diagnosed in 2019.  He developed IV clear-cell  with pulmonary metastasis  noted in July 2021.    Prior Therapy:  He is status post laparoscopic right radical nephrectomy on May 31, 2018.  He is status post right hip and left lung radiation therapy completed in August 2021.  He received 50 Gray in 5 fractions  He is status post radiation to the right posterior acetabular lesion completed in November 2021.  He received at 50 Gray in 5 fractions.  He status post radiation therapy to the right and the left proximal femur for a total of 30 Gray in 10 fractions completed in February 2022.  Current therapy: Cabometyx 40 mg daily started on 05/28/2020.  Nivolumab added on September 20, 2020.  Treatments are currently on hold.   Interim History: James Massey returns today for repeat evaluation.  Since the last visit, he reports no major changes in his health.  He continues to participate in physical therapy with improvement in his mobility and stamina.  He has reported occasional pelvic and leg pain but overall able to participate in rehab without any decline.  He continues to have lower extremity swelling but not significantly worse.  Appetite slightly down lost few pounds.  Medications: Reviewed without changes. Current Outpatient Medications  Medication Sig Dispense Refill   allopurinol (ZYLOPRIM) 300 MG tablet Take 300 mg by mouth at bedtime.      amLODipine (NORVASC) 10 MG tablet Take 1 tablet (10 mg total) by mouth daily. 180 tablet 3   atorvastatin (LIPITOR) 40 MG tablet Take 40 mg by mouth at bedtime.      calcium-vitamin D (OSCAL WITH D) 500-200 MG-UNIT tablet Take 2 tablets by mouth 2 (two) times daily. 60 tablet 0   diazepam (VALIUM) 5 MG/ML solution Take 5 mg by mouth every 8 (eight)  hours as needed for anxiety.     furosemide (LASIX) 20 MG tablet Take 20 mg by mouth as needed.     hydrocortisone 2.5 % cream Apply 1 application topically 2 (two) times daily as needed (for hemorroidal flare ups).     levothyroxine (SYNTHROID) 50 MCG tablet Take 1 tablet (50 mcg total) by mouth daily before breakfast. 30 tablet 2   magnesium oxide (MAG-OX) 400 (240 Mg) MG tablet Take 1 tablet (400 mg total) by mouth 2 (two) times daily. (Patient not taking: Reported on 02/05/2021) 60 tablet 2   Multiple Vitamin (MULTIVITAMIN) tablet Take 2 tablets by mouth daily.     pantoprazole (PROTONIX) 40 MG tablet Take 40 mg by mouth 2 (two) times daily.     potassium chloride SA (KLOR-CON) 10 MEQ tablet Take 1 tablet (10 mEq total) by mouth 2 (two) times daily. Patient unable to tolerate 20 meq bid, is taking 10 meq bid. 60 tablet 2   Simethicone 180 MG CAPS Take 180 mg by mouth 3 (three) times daily as needed (for gas/indigestion.).     traMADol (ULTRAM) 50 MG tablet TAKE 1 TABLET(50 MG) BY MOUTH EVERY 6 HOURS AS NEEDED 60 tablet 1   No current facility-administered medications for this visit.     Allergies:  Allergies  Allergen Reactions   Bee Venom Swelling   Budesonide-Formoterol Fumarate Other (See Comments)    RESPIRATORY ISSUES   Colchicine Nausea And Vomiting   Poison  Oak Extract     Poison [employer] and ivy   Clindamycin/Lincomycin Rash     Physical Exam:       Blood pressure (!) 114/56, pulse 100, temperature 98.3 F (36.8 C), temperature source Oral, resp. rate 18, height '5\' 8"'$  (1.727 m), weight 177 lb 9.6 oz (80.6 kg), SpO2 98 %.     ECOG: 1    General appearance: Alert, awake without any distress. Head: Atraumatic without abnormalities Oropharynx: Without any thrush or ulcers. Eyes: No scleral icterus. Lymph nodes: No lymphadenopathy noted in the cervical, supraclavicular, or axillary nodes Heart:regular rate and rhythm, without any murmurs or gallops.  Bilateral lower  extremity edema noted. Lung: Clear to auscultation without any rhonchi, wheezes or dullness to percussion. Abdomin: Soft, nontender without any shifting dullness or ascites. Musculoskeletal: No clubbing or cyanosis. Neurological: No motor or sensory deficits. Skin: No rashes or lesions.              Lab Results: Lab Results  Component Value Date   WBC 10.6 (H) 03/19/2021   HGB 12.3 (L) 03/19/2021   HCT 37.3 (L) 03/19/2021   MCV 101.1 (H) 03/19/2021   PLT 322 03/19/2021     Chemistry      Component Value Date/Time   NA 136 03/19/2021 1123   K 4.4 03/19/2021 1123   CL 100 03/19/2021 1123   CO2 26 03/19/2021 1123   BUN 19 03/19/2021 1123   CREATININE 1.39 (H) 03/19/2021 1123      Component Value Date/Time   CALCIUM 9.9 03/19/2021 1123   ALKPHOS 82 03/19/2021 1123   AST 24 03/19/2021 1123   ALT 31 03/19/2021 1123   BILITOT 0.5 03/19/2021 1123      IMPRESSION: 1. Interval progression of peripheral nodularity in the right lung base with a peribronchovascular distribution in some regions. Imaging features likely reflect infectious/inflammatory etiology and atypical etiology should be considered. 2. Scattered new tiny pulmonary nodules in the right lung, nonspecific but attention on follow-up recommended. 3. Stable appearance of probable scarring posterior left costophrenic sulcus. 4. Dominant destructive bone lesions of the right hemipelvis in left scapula are stable. 5. Interval increase in mineralization of the T3 vertebral body lesion suggesting interval healing. 6. Aortic Atherosclerosis (ICD10-I70.0) and Emphysema (ICD10-J43.9).       Impression and Plan:   75 year old man with:   1.  Kidney cancer diagnosed in 2021.  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   CT scan obtained on 03/19/2021 was personally reviewed which not dramatically different from previous imaging studies.  With overall stable  right hemipelvic lesion.  Treatment options moving forward were discussed.  Restarting Cabometyx versus switching to a different agent were reviewed.   After discussion today, he is valuing quality of life at this time which has suffered with cancer treatment.  He prefers to continue to hold off treatment unless absolutely needed.  At this time, I recommended continued active surveillance will repeat imaging studies in 3 months.     3.  Prognosis and goals of care: Any treatment at this time is palliative but aggressive measures are warranted given his reasonable performance status.     4.  Hypothyroidism: He is currently on thyroid replacement with TSH normalizing.  5.  Metabolic derangement: His potassium and calcium are back within normal range.  6.  Follow up:      30  minutes were spent on this visit.  Time was dedicated to reviewing laboratory  data, disease status update, treatment choices and future plan of care review.  [name], MD 9/16/20223:32 PM

## 2021-03-26 ENCOUNTER — Ambulatory Visit: Payer: Medicare Other | Admitting: Physical Therapy

## 2021-03-26 ENCOUNTER — Other Ambulatory Visit: Payer: Self-pay

## 2021-03-26 DIAGNOSIS — M6281 Muscle weakness (generalized): Secondary | ICD-10-CM | POA: Diagnosis not present

## 2021-03-26 DIAGNOSIS — G8929 Other chronic pain: Secondary | ICD-10-CM

## 2021-03-26 DIAGNOSIS — R262 Difficulty in walking, not elsewhere classified: Secondary | ICD-10-CM

## 2021-03-26 DIAGNOSIS — M25551 Pain in right hip: Secondary | ICD-10-CM

## 2021-03-26 DIAGNOSIS — M25651 Stiffness of right hip, not elsewhere classified: Secondary | ICD-10-CM

## 2021-03-26 DIAGNOSIS — R2689 Other abnormalities of gait and mobility: Secondary | ICD-10-CM

## 2021-03-26 DIAGNOSIS — R2681 Unsteadiness on feet: Secondary | ICD-10-CM

## 2021-03-26 DIAGNOSIS — M25561 Pain in right knee: Secondary | ICD-10-CM

## 2021-03-26 NOTE — Therapy (Signed)
[facility] [address]  [place], [place], [address] Phone: [phone]   Fax:  [phone]  Physical Therapy Treatment / Recert  Patient Details  Name: James Massey MRN: [id number] Date of Birth: 08/19/1945 Referring Provider (PT): Lawson Radar, PA-C  Progress Note  Reporting Period 02/05/2021 to 03/26/2021  See note below for Objective Data and Assessment of Progress/Goals.     Encounter Date: 03/26/2021   PT End of Session - 03/26/21 1624     Visit Number 11    Number of Visits 27    Date for PT Re-Evaluation 05/21/21    Authorization Type Medicare & BCBS    Progress Note Due on Visit 21   Recert on visit #22 - 2/97/98   PT Start Time 1624    PT Stop Time 1731    PT Time Calculation (min) 67 min    Equipment Utilized During Treatment Gait belt    Activity Tolerance Patient tolerated treatment well    Behavior During Therapy WFL for tasks assessed/performed             Past Medical History:  Diagnosis Date   Anxiety    Barrett esophagus    GERD (gastroesophageal reflux disease)    Gout    Heart murmur    History of anemia    Kidney cancer, primary, 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 to other site [facility])    Mixed hyperlipidemia    Renal cell cancer, right ([place])    Right renal mass    Wears glasses     Past Surgical History:  Procedure Laterality Date   CYSTOSCOPY WITH RETROGRADE PYELOGRAM, URETEROSCOPY AND STENT PLACEMENT Right 05/21/2018   Procedure: RIGHT RETROGRADE PYELOGRAM, RIGHT DIAGNOSTIC URETEROSCOPY AND STENT PLACEMENT;  Surgeon: Ardis Hughs, MD;  Location: [facility];  Service: Urology;  Laterality: Right;   INGUINAL HERNIA REPAIR Bilateral 09/2012   LAPAROSCOPIC NEPHRECTOMY, HAND ASSISTED Right 05/31/2018   Procedure: LAPAROSCOPIC RADICAL RIGHT NEPHRECTOMY;  Surgeon: Ardis Hughs, MD;  Location: [facility];  Service: Urology;  Laterality: Right;    There were no vitals  filed for this visit.   Subjective Assessment - 03/26/21 1629     Subjective Pt reports R hip pain is better today - ranging from 1-2/10 to 7/10.    Patient is accompained by: Family member   Wife   Patient Stated Goals "ideally to be able to walk w/o having to use the walker"    Currently in Pain? Yes    Pain Score 5    4-5/10, up to 7/10 at worst today   Pain Location Hip    Pain Orientation Right    Pain Descriptors / Indicators Sharp    Pain Type Chronic pain    Pain Frequency Constant   varies in intensity   Aggravating Factors  prolonged sitting in firm chair    Pain Score 9   "but tolerable"   Pain Location Knee    Pain Orientation Right    Pain Descriptors / Indicators Sharp    Pain Type Chronic pain                [facility] PT Assessment - 03/26/21 1624       Assessment   Medical Diagnosis Deconditioning    Referring Provider (PT) Lawson Radar, PA-C    Onset Date/Surgical Date --   hospitalized in July but declining for a few months prior   Next MD Visit 04/29/21 with  Rita Ohara, MD (PCP); 05/03/21 with [name], MD      Prior Function   Level of Independence Independent with basic ADLs;Independent with household mobility with device;Needs assistance with homemaking    Vocation Retired    Leisure play pool, mostly sedentary except exercises/walking with CNA, would like to be able to walk in the neighborhood with his neighbor      Strength   Overall Strength Comments LE strength testing as of 03/14/21 - testing completed in sitting d/t increased R hip pain - pt unable to tolerate resistance for R hip MMT    Right Shoulder Flexion 5/5    Right Shoulder ABduction 5/5    Right Shoulder Internal Rotation 5/5    Right Shoulder External Rotation 4+/5    Left Shoulder Flexion 5/5    Left Shoulder ABduction 5/5    Left Shoulder Internal Rotation 5/5    Left Shoulder External Rotation 4+/5    Right Elbow Flexion 5/5    Right Elbow Extension 4/5    Left Elbow  Flexion 5/5    Left Elbow Extension 4/5    Left Hip Flexion 4+/5    Left Hip Extension 4/5    Left Hip External Rotation 4+/5    Left Hip Internal Rotation 4+/5    Left Hip ABduction 4+/5    Left Hip ADduction 4+/5    Right Knee Flexion 4/5    Right Knee Extension 4/5    Left Knee Flexion 4+/5    Left Knee Extension 4+/5    Right Ankle Dorsiflexion 4-/5    Left Ankle Dorsiflexion 4+/5      Ambulation/Gait   Ambulation/Gait Assistance 5: Supervision    Ambulation Distance (Feet) 270 Feet    Assistive device 4-wheeled walker;Rollator    Gait Pattern Step-through pattern;Decreased weight shift to right;Decreased stance time - right;Decreased step length - left;Right flexed knee in stance;Decreased hip/knee flexion - right;Decreased hip/knee flexion - left;Trunk flexed   decreased R hip extension   Ambulation Surface Level;Indoor    Gait velocity 1.96 ft/sec      Standardized Balance Assessment   10 Meter Walk 16.69 sec with rollator      Berg Balance Test   Sit to Stand Able to stand without using hands and stabilize independently    Standing Unsupported Able to stand safely 2 minutes    Sitting with Back Unsupported but Feet Supported on Floor or Stool Able to sit safely and securely 2 minutes    Stand to Sit Sits safely with minimal use of hands    Transfers Able to transfer safely, definite need of hands    Standing Unsupported with Eyes Closed Able to stand 10 seconds safely    Standing Unsupported with Feet Together Able to place feet together independently and stand for 1 minute with supervision   2-3" btw feet   From Standing, Reach Forward with Outstretched Arm Can reach forward >12 cm safely (5")    From Standing Position, Pick up Object from Floor Unable to pick up and needs supervision    From Standing Position, Turn to Look Behind Over each Shoulder Turn sideways only but maintains balance    Turn 360 Degrees Needs assistance while turning    Standing Unsupported,  Alternately Place Feet on Step/Stool Needs assistance to keep from falling or unable to try    Standing Unsupported, One Foot in Front Able to plae foot ahead of the other independently and hold 30 seconds    Standing  on One Leg Tries to lift leg/unable to hold 3 seconds but remains standing independently    Total Score 36    Berg comment: < 36 high risk for falls (close to 100%)      Timed Up and Go Test   Normal TUG (seconds) 22.34   with RW                                     PT Short Term Goals - 03/26/21 1658       PT SHORT TERM GOAL #1   Title Patient will be independent with initial HEP    Status Achieved   02/25/21     PT SHORT TERM GOAL #2   Title Patient will demonstrate safe transfer technique and proper gait pattern with 4-wheel rolling walker or LRAD as indicated    Status Partially Met   03/26/21 - improved safety awareness w/ transfers with rollator but occasional cues still necessary to lock brakes; good rollator proximity with gait but R hip pain preventing good posture & adequate hip extension for even step-through gait pattern   Target Date 04/23/21      PT SHORT TERM GOAL #3   Title Patient will increase gait speed to >/= 2.0 ft/sec with RW or LRAD to decrease risk for recurrent falls    Baseline 1.64 ft/sec with RW    Status Partially Met   03/26/21 - gait speed increased to 1.96 ft/sec with rollator   Target Date 04/23/21      PT SHORT TERM GOAL #4   Title Determine balance goals based on results of standardized balance testing    Status Achieved   02/11/21              PT Long Term Goals - 03/26/21 1700       PT LONG TERM GOAL #1   Title Patient will be independent with ongoing/advanced HEP for self-management at home in order to build upon functional gains in therapy    Status Partially Met    Target Date 05/21/21      PT LONG TERM GOAL #2   Title Decrease R hip and knee pain by >/= 50% during transitional movements  allowing patient increased ease of mobiliy and transfers    Status On-going    Target Date 05/21/21      PT LONG TERM GOAL #3   Title Patient will demonstrate improved B LE strength to >/= 4+/5 for improved stability and ease of mobility    Status Partially Met    Target Date 05/21/21      PT LONG TERM GOAL #4   Title Patient will increase gait speed to >/= 2.62 ft/sec with rollator, SPC or LRAD to increase safety with community ambulation    Baseline 1.64 ft/sec with RW    Status Partially Met   03/26/21 - gait speed improved to 1.96 ft/sec   Target Date 05/21/21      PT LONG TERM GOAL #5   Title Patient will demonstrate decreased TUG time to </= 18 sec with SPC or LRAD to decrease risk for falls with transitional mobility    Baseline 26.5 sec with RW    Status Partially Met   03/26/21 - TUG decreased to 22.34 sec with rollator   Target Date 05/21/21      PT LONG TERM GOAL #6   Title Patient will improve Berg score  to >/= 40/56 to improve safety stability with ADLs in standing and reduce risk for falls    Baseline 26/56    Status Partially Met   03/26/21 - Berg increased to 36/56   Target Date 05/21/21                   Plan - 03/26/21 1731     Clinical Impression Statement James Massey reports his R hip pain is not as bad today as in recent visits but still can get up to 7/10 at times and continues to impact his posture and gait pattern with pt demonstrating a flexed trunk posture with R LE remaining flexed in stance and limited hip extension ROM along with decreased weight shift to and stance time on R creating an uneven step-through gait pattern. Prior to the flare-up of his R hip pain, James Massey was demonstrating increased ease of transfers and improving gait pattern and speed with some return to these levels noted today. Balance reassessment completed today with pt demonstrating progress on all standardized tests: gait speed increased to 1.96 ft/sec, TUG decreased from 26.5 to  22.34 sec, and Berg increased from 26/56 to 36/56. His overall LE strength has improved with exception of inability to assess R hip strength with recent MMT due to increased pain with attempts at resisted motion. Given more limited mobility and activity tolerance for the past few weeks due to increased R hip pain, progress toward goals has been slower than anticipated however nearly all STGs and LTGs at least partially met. R hip pain seems to be starting to subside and James Massey has obtained a home TENS unit to help with pain management, therefore anticipate he should be able to continue to demonstrate further progress with PT so will recommend recert for continued skilled PT for further strengthening, mobility, gait and balance training with continued frequency of 2x/wks for up to 8 weeks anticipating a break following his upcoming eye surgery on 04/24/21.    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); R nephrectomy 2 cancer; OA; gout; GERD    Examination-Activity Limitations Bathing;Bed Mobility;Bend;Caring for Others;Lift;Carry;Locomotion Level;Sit;Sleep;Squat;Stairs;Stand;Toileting;Transfers    Examination-Participation Restrictions Community Activity;Driving;Interpersonal Relationship;Shop;Cleaning;Meal Prep;Yard Work    Rehab Potential Good    PT Frequency 2x / week    PT Duration 8 weeks    PT Treatment/Interventions ADLs/Self Care Home Management;Cryotherapy;Moist Heat;DME Instruction;Gait training;Stair training;Functional mobility training;Therapeutic activities;Therapeutic exercise;Balance training;Neuromuscular re-education;Patient/family education;Manual techniques;Passive range of motion;Dry needling;Taping;Vasopneumatic Device;Electrical Stimulation;Iontophoresis 55m/ml Dexamethasone;Joint Manipulations    PT Next  Visit Plan core/lumboplevic and LE flexibility and strengthening - HEP updates as indicated; balance training; gait training to normalize gait pattern and improve stability; pain management for R hip pain PRN - manual therapy and modalities including estim or possible ionto;    PT Home Exercise Plan Access Code: [email or web address] (8/4, updated 8/8)    Consulted and Agree with Plan of Care Patient;Family member/caregiver    Family Member Consulted wife - CHoyle Sauer            Patient will benefit from skilled therapeutic intervention in order to improve the following deficits and impairments:  Abnormal gait, Decreased activity tolerance, Decreased balance, Decreased endurance, Decreased knowledge of precautions, Decreased knowledge of use of DME, Decreased mobility, Decreased range of motion, Decreased safety awareness, Decreased strength, Difficulty walking, Increased fascial restricitons, Increased muscle spasms, Impaired perceived functional ability, Impaired flexibility, Improper body  mechanics, Postural dysfunction, Pain  Visit Diagnosis: Muscle weakness (generalized)  Unsteadiness on feet  Other abnormalities of gait and mobility  Difficulty in walking, not elsewhere classified  Chronic pain of right knee  Pain in right hip  Stiffness of right hip, not elsewhere classified     Problem List Patient Active Problem List   Diagnosis Date Noted   B12 deficiency 01/16/2021   Depression 01/16/2021   Hypoalbuminemia 01/16/2021   Generalized weakness 01/16/2021   Hypothyroidism (acquired) 01/13/2021   Altered mental status 61/95/0932   Acute metabolic encephalopathy 67/06/4579   Hypokalemia 01/09/2021   Hypomagnesemia 01/09/2021   Hypocalcemia 01/09/2021   Primary malignant neoplasm of kidney 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 to other site [facility]) 09/05/2020   Goals of care, counseling/discussion 09/05/2020   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  [place], [place], [address] Phone: [phone]   Fax:  [phone]  Name: James Massey MRN: [id number] Date of Birth: 1945-10-26

## 2021-03-28 ENCOUNTER — Other Ambulatory Visit (HOSPITAL_COMMUNITY): Payer: Self-pay

## 2021-03-28 ENCOUNTER — Telehealth: Payer: Self-pay | Admitting: *Deleted

## 2021-03-28 NOTE — Telephone Encounter (Signed)
Surgical clearance form for patient's upcoming cataract extraction faxed to [facility], Surgical Coordinator with [place] [phone]).  Fax confirmation received.

## 2021-04-03 ENCOUNTER — Encounter: Payer: Self-pay | Admitting: Physical Therapy

## 2021-04-03 ENCOUNTER — Ambulatory Visit: Payer: Medicare Other | Admitting: Physical Therapy

## 2021-04-03 ENCOUNTER — Other Ambulatory Visit: Payer: Self-pay

## 2021-04-03 DIAGNOSIS — R262 Difficulty in walking, not elsewhere classified: Secondary | ICD-10-CM

## 2021-04-03 DIAGNOSIS — M6281 Muscle weakness (generalized): Secondary | ICD-10-CM | POA: Diagnosis not present

## 2021-04-03 DIAGNOSIS — G8929 Other chronic pain: Secondary | ICD-10-CM

## 2021-04-03 DIAGNOSIS — R2681 Unsteadiness on feet: Secondary | ICD-10-CM

## 2021-04-03 DIAGNOSIS — R2689 Other abnormalities of gait and mobility: Secondary | ICD-10-CM

## 2021-04-03 DIAGNOSIS — M25551 Pain in right hip: Secondary | ICD-10-CM

## 2021-04-03 DIAGNOSIS — M25651 Stiffness of right hip, not elsewhere classified: Secondary | ICD-10-CM

## 2021-04-03 NOTE — Patient Instructions (Addendum)
   Access Code: [email or web address] URL: [email or web address] Date: 04/03/2021 Prepared by: Annie Paras  Exercises Seated Isometric Hip Abduction with Resistance - 1 x daily - 4 x weekly - 2 sets - 10 reps - 3 sec hold Seated March with Resistance - 1 x daily - 4 x weekly - 2 sets - 10 reps - 3 sec hold Sit to Stand with Resistance Around Legs - 1 x daily - 4 x weekly - 2 sets - 10 reps Seated Hip Adduction Squeeze with Ball - 1 x daily - 4 x weekly - 2 sets - 10 reps - 5 sec hold Seated Hip Internal Rotation with Ball and Resistance - 1 x daily - 4 x weekly - 2 sets - 10 reps - 3 sec hold (deferred) Seated Hamstring Stretch - 1-2 x daily - 7 x weekly - 3 reps - 30 sec hold Seated Piriformis Stretch - 1-2 x daily - 7 x weekly - 3 reps - 30 sec hold Seated Leg Press with Resistance - 1 x daily - 4 x weekly - 2 sets - 10 reps - 3 sec hold
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
## 2021-04-09 ENCOUNTER — Other Ambulatory Visit: Payer: Self-pay

## 2021-04-09 ENCOUNTER — Ambulatory Visit: Payer: Medicare Other | Attending: Physician Assistant | Admitting: Physical Therapy

## 2021-04-09 ENCOUNTER — Encounter: Payer: Self-pay | Admitting: Physical Therapy

## 2021-04-09 DIAGNOSIS — M25561 Pain in right knee: Secondary | ICD-10-CM | POA: Insufficient documentation

## 2021-04-09 DIAGNOSIS — G8929 Other chronic pain: Secondary | ICD-10-CM | POA: Diagnosis present

## 2021-04-09 DIAGNOSIS — R2689 Other abnormalities of gait and mobility: Secondary | ICD-10-CM | POA: Diagnosis present

## 2021-04-09 DIAGNOSIS — M25651 Stiffness of right hip, not elsewhere classified: Secondary | ICD-10-CM | POA: Insufficient documentation

## 2021-04-09 DIAGNOSIS — M6281 Muscle weakness (generalized): Secondary | ICD-10-CM | POA: Diagnosis present

## 2021-04-09 DIAGNOSIS — M25551 Pain in right hip: Secondary | ICD-10-CM | POA: Diagnosis present

## 2021-04-09 DIAGNOSIS — R262 Difficulty in walking, not elsewhere classified: Secondary | ICD-10-CM | POA: Diagnosis present

## 2021-04-09 DIAGNOSIS — R2681 Unsteadiness on feet: Secondary | ICD-10-CM | POA: Insufficient documentation
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
## 2021-04-09 NOTE — Patient Instructions (Addendum)
    Access Code: [email or web address] URL: [email or web address] Date: 04/09/2021 Prepared by: Annie Paras  Exercises Seated Isometric Hip Abduction with Resistance - 1 x daily - 4 x weekly - 2 sets - 10 reps - 3 sec hold Seated March with Resistance - 1 x daily - 4 x weekly - 2 sets - 10 reps - 3 sec hold Sit to Stand with Resistance Around Legs - 1 x daily - 4 x weekly - 2 sets - 10 reps Seated Hip Adduction Squeeze with Ball - 1 x daily - 4 x weekly - 2 sets - 10 reps - 5 sec hold Seated Hip Internal Rotation with Ball and Resistance - 1 x daily - 4 x weekly - 2 sets - 10 reps - 3 sec hold Seated Piriformis Stretch - 1-2 x daily - 7 x weekly - 3 reps - 30 sec hold Seated Hamstring Stretch - 1-2 x daily - 7 x weekly - 3 reps - 30 sec hold Seated Hamstring Curl with Anchored Resistance - 1 x daily - 4 x weekly - 2 sets - 10 reps - 3 sec hold Seated Leg Press with Resistance - 1 x daily - 4 x weekly - 2 sets - 10 reps - 3 sec hold

## 2021-04-11 ENCOUNTER — Ambulatory Visit: Payer: Medicare Other | Admitting: Physical Therapy

## 2021-04-11 ENCOUNTER — Encounter: Payer: Self-pay | Admitting: Physical Therapy

## 2021-04-11 ENCOUNTER — Other Ambulatory Visit: Payer: Self-pay

## 2021-04-11 DIAGNOSIS — M6281 Muscle weakness (generalized): Secondary | ICD-10-CM

## 2021-04-11 DIAGNOSIS — M25651 Stiffness of right hip, not elsewhere classified: Secondary | ICD-10-CM

## 2021-04-11 DIAGNOSIS — G8929 Other chronic pain: Secondary | ICD-10-CM

## 2021-04-11 DIAGNOSIS — R2689 Other abnormalities of gait and mobility: Secondary | ICD-10-CM

## 2021-04-11 DIAGNOSIS — R262 Difficulty in walking, not elsewhere classified: Secondary | ICD-10-CM

## 2021-04-11 DIAGNOSIS — M25551 Pain in right hip: Secondary | ICD-10-CM

## 2021-04-11 DIAGNOSIS — R2681 Unsteadiness on feet: Secondary | ICD-10-CM

## 2021-04-11 NOTE — Patient Instructions (Addendum)
        IONTOPHORESIS PATIENT PRECAUTIONS & CONTRAINDICATIONS:  Redness under one or both electrodes can occur.  This characterized by a uniform redness that usually disappears within 12 hours of treatment. Small pinhead size blisters may result in response to the drug.  Contact your physician if the problem persists more than 24 hours. On rare occasions, iontophoresis therapy can result in temporary skin reactions such as rash, inflammation, irritation or burns.  The skin reactions may be the result of individual sensitivity to the ionic solution used, the condition of the skin at the start of treatment, reaction to the materials in the electrodes, allergies or sensitivity to dexamethasone, or a poor connection between the patch and your skin.  Discontinue using iontophoresis if you have any of these reactions and report to your therapist. Remove the Patch or electrodes if you have any undue sensation of pain or burning during the treatment and report discomfort to your therapist. Tell your Therapist if you have had known adverse reactions to the application of electrical current. Approximate treatment time is 4-6 hours.  Remove the patch after 6 hours. The Patch can be worn during normal activity, however excessive motion where the electrodes have been placed can cause poor contact between the skin and the electrode or uneven electrical current resulting in greater risk of skin irritation. Keep out of the reach of children.   DO NOT use if you have a cardiac pacemaker or any other electrically sensitive implanted device. DO NOT use if you have a known sensitivity to dexamethasone. DO NOT use during Magnetic Resonance Imaging (MRI). DO NOT use over broken or compromised skin (e.g. sunburn, cuts, or acne) due to the increased risk of skin reaction. DO NOT SHAVE over the area to be treated:  To establish good contact between the Patch and the skin, excessive hair may be clipped. DO NOT place the  Patch or electrodes on or over your eyes, directly over your heart, or brain. DO NOT reuse the Patch or electrodes as this may cause burns to occur.   For questions, please contact your therapist at:  [facility] [place] [address]  [address] [place], [place], [address] Phone: [phone]   Fax:  [phone]  

## 2021-04-11 NOTE — Therapy (Signed)
[facility] [place] [address]  [place], [place], [address] Phone: [phone]   Fax:  [phone]  Physical Therapy Treatment  Patient Details  Name: James Massey MRN: [id number] Date of Birth: 28-May-1946 Referring Provider (PT): Lawson Radar, PA-C   Encounter Date: 04/11/2021   PT End of Session - 04/11/21 1259     Visit Number 14    Number of Visits 27    Date for PT Re-Evaluation 05/21/21    Authorization Type Medicare & BCBS    Progress Note Due on Visit 21   Recert on visit #75 - 3/00/51   PT Start Time 1259    PT Stop Time 1351    PT Time Calculation (min) 52 min    Activity Tolerance Patient tolerated treatment well    Behavior During Therapy WFL for tasks assessed/performed             Past Medical History:  Diagnosis Date   Anxiety    Barrett esophagus    GERD (gastroesophageal reflux disease)    Gout    Heart murmur    History of anemia    Kidney cancer, primary, 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 to other site [facility])    Mixed hyperlipidemia    Renal cell cancer, right ([place])    Right renal mass    Wears glasses     Past Surgical History:  Procedure Laterality Date   CYSTOSCOPY WITH RETROGRADE PYELOGRAM, URETEROSCOPY AND STENT PLACEMENT Right 05/21/2018   Procedure: RIGHT RETROGRADE PYELOGRAM, RIGHT DIAGNOSTIC URETEROSCOPY AND STENT PLACEMENT;  Surgeon: Ardis Hughs, MD;  Location: [facility];  Service: Urology;  Laterality: Right;   INGUINAL HERNIA REPAIR Bilateral 09/2012   LAPAROSCOPIC NEPHRECTOMY, HAND ASSISTED Right 05/31/2018   Procedure: LAPAROSCOPIC RADICAL RIGHT NEPHRECTOMY;  Surgeon: Ardis Hughs, MD;  Location: [facility];  Service: Urology;  Laterality: Right;    There were no vitals filed for this visit.   Subjective Assessment - 04/11/21 1301     Subjective Pt noting some increased soreness following last visit last into the next day.    Patient is accompained by:  Family member   Wife   Patient Stated Goals "ideally to be able to walk w/o having to use the walker"    Pain Score 5    4-5/10   Pain Location Hip    Pain Orientation Right    Pain Descriptors / Indicators Sharp    Pain Type Chronic pain    Pain Score 5   4-5/10   Pain Location Knee    Pain Orientation Right    Pain Descriptors / Indicators Sharp    Pain Type Chronic pain    Pain Score 6   5-6/10   Pain Location Ankle    Pain Orientation Right    Pain Descriptors / Indicators Sharp    Pain Type Acute pain;Chronic pain                               [facility] Adult PT Treatment/Exercise - 04/11/21 1259       Ambulation/Gait   Ambulation/Gait Assistance 5: Supervision    Ambulation Distance (Feet) 240 Feet    Assistive device 4-wheeled walker;Rollator    Gait Pattern Step-through pattern;Decreased weight shift to right;Decreased stance time - right;Decreased step length - left;Right flexed knee in stance;Decreased hip/knee flexion - right;Decreased hip/knee flexion - left;Trunk flexed   decreased R  hip extension   Gait Comments Stride length slightly improved with foot placement now just passing opposite foot but continued R knee flexed in stance with limited weight shift/stance on R.      Knee/Hip Exercises: Stretches   Passive Hamstring Stretch Right;Left;3 reps;30 seconds    Passive Hamstring Stretch Limitations R seated hip hinge & R/L seated hip hinge + strap      Knee/Hip Exercises: Aerobic   Nustep L6 x 6 min (UE/LE)      Knee/Hip Exercises: Standing   Other Standing Knee Exercises R retro step 2 x 10 with B UE support; x 10 with R UE support and L hand raise   support on counter & locked rollator   Other Standing Knee Exercises Church pews 2 x 10      Knee/Hip Exercises: Seated   Long Arc Quad Right;Left;2 sets;10 reps;Strengthening    Long Arc Quad Weight 2 lbs.    Marching Both;10 reps;Strengthening    Marching Limitations 2# cuff wt at ankles       Modalities   Modalities Iontophoresis      Iontophoresis   Type of Iontophoresis Dexamethasone    Location R greater trochanter    Dose 80 mA-min, 1.0 mL    Time 4-6 hr patch (#1 of 6)                       PT Short Term Goals - 04/09/21 1500       PT SHORT TERM GOAL #1   Title Patient will be independent with initial HEP    Status Achieved   02/25/21     PT SHORT TERM GOAL #2   Title Patient will demonstrate safe transfer technique and proper gait pattern with 4-wheel rolling walker or LRAD as indicated    Status Achieved   04/09/21   Target Date --      PT SHORT TERM GOAL #3   Title Patient will increase gait speed to >/= 2.0 ft/sec with RW or LRAD to decrease risk for recurrent falls    Baseline 1.64 ft/sec with RW    Status Achieved   04/09/21 - gait speed 2.29 ft/sec with rollator   Target Date --      PT SHORT TERM GOAL #4   Title Determine balance goals based on results of standardized balance testing    Status Achieved   02/11/21              PT Long Term Goals - 03/26/21 1700       PT LONG TERM GOAL #1   Title Patient will be independent with ongoing/advanced HEP for self-management at home in order to build upon functional gains in therapy    Status Partially Met    Target Date 05/21/21      PT LONG TERM GOAL #2   Title Decrease R hip and knee pain by >/= 50% during transitional movements allowing patient increased ease of mobiliy and transfers    Status On-going    Target Date 05/21/21      PT LONG TERM GOAL #3   Title Patient will demonstrate improved B LE strength to >/= 4+/5 for improved stability and ease of mobility    Status Partially Met    Target Date 05/21/21      PT LONG TERM GOAL #4   Title Patient will increase gait speed to >/= 2.62 ft/sec with rollator, SPC or LRAD to increase safety with community ambulation  Baseline 1.64 ft/sec with RW    Status Partially Met   03/26/21 - gait speed improved to 1.96 ft/sec   Target  Date 05/21/21      PT LONG TERM GOAL #5   Title Patient will demonstrate decreased TUG time to </= 18 sec with SPC or LRAD to decrease risk for falls with transitional mobility    Baseline 26.5 sec with RW    Status Partially Met   03/26/21 - TUG decreased to 22.34 sec with rollator   Target Date 05/21/21      PT LONG TERM GOAL #6   Title Patient will improve Berg score to >/= 40/56 to improve safety stability with ADLs in standing and reduce risk for falls    Baseline 26/56    Status Partially Met   03/26/21 - Berg increased to 36/56   Target Date 05/21/21                   Plan - 04/11/21 1351     Clinical Impression Statement Richard reports pain at R hip and knee slightly better but ankle unchanged since last visit. Incorporated ankle stretch into seated hip hinge HS stretch with pt noting positive response, therefore HEP updated accordingly. Stride length slightly improved with gait with foot placement now just passing opposite foot but continued R knee flexed in stance with limited weight shift/stance on R. Worked on reactive quad activation to improve knee extension and quad stability with church pews and retro step but fatigue and pain limiting tolerance for further standing exercises. Introduced cuff weight resistance as alternative to therabands with better tolerance reported with LAQ. Recommended adjustable cuff weights for home if pt choosing to purchase home weights to allow for gradual progression of resistance. No response received from nephrologist regarding use of dexamethasone for ionto but pt expressing desire to proceed with trial of ionto patch for R greater trochanter pain.    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); R nephrectomy 2 cancer; OA; gout; GERD    Rehab Potential Good    PT Frequency 2x / week    PT Duration 8 weeks    PT Treatment/Interventions ADLs/Self Care Home Management;Cryotherapy;Moist Heat;DME  Instruction;Gait training;Stair training;Functional mobility training;Therapeutic activities;Therapeutic exercise;Balance training;Neuromuscular re-education;Patient/family education;Manual techniques;Passive range of motion;Dry needling;Taping;Vasopneumatic Device;Electrical Stimulation;Iontophoresis 66m/ml Dexamethasone    PT Next Visit Plan assess response to ionto patch; goal assessment and HEP review/update as needed as pt will be taking a few week break from PT for upcoming eye surgery; pain management as able for R hip pain PRN - manual therapy and modalities including estim or ionto if benefit noted; core/lumboplevic and LE flexibility and strengthening - HEP updates as indicated; balance training; gait training to normalize gait pattern and improve stability    PT Home Exercise Plan Access Code: [email or web address] (8/4, updated 8/8, 9/28 & 10/4)    Consulted and Agree with Plan of Care Patient;Family member/caregiver    Family Member Consulted wife - CHoyle Sauer            Patient will benefit from skilled therapeutic intervention in order to improve the following deficits and impairments:  Abnormal gait, Decreased activity tolerance, Decreased balance, Decreased endurance, Decreased knowledge of precautions, Decreased knowledge of use of DME, Decreased mobility, Decreased range of motion, Decreased safety awareness, Decreased strength, Difficulty walking, Increased fascial restricitons, Increased muscle spasms, Impaired perceived functional ability, Impaired flexibility, Improper body mechanics, Postural dysfunction, Pain  Visit Diagnosis: Muscle  weakness (generalized)  Unsteadiness on feet  Other abnormalities of gait and mobility  Difficulty in walking, not elsewhere classified  Chronic pain of right knee  Pain in right hip  Stiffness of right hip, not elsewhere classified     Problem List Patient Active Problem List   Diagnosis Date Noted   B12 deficiency 01/16/2021   Depression  01/16/2021   Hypoalbuminemia 01/16/2021   Generalized weakness 01/16/2021   Hypothyroidism (acquired) 01/13/2021   Altered mental status 37/34/2876   Acute metabolic encephalopathy 81/15/7262   Hypokalemia 01/09/2021   Hypomagnesemia 01/09/2021   Hypocalcemia 01/09/2021   Primary malignant neoplasm of kidney 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 to other site [facility]) 09/05/2020   Goals of care, counseling/discussion 09/05/2020   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

## 2021-04-13 ENCOUNTER — Other Ambulatory Visit: Payer: Self-pay | Admitting: Oncology

## 2021-04-15 ENCOUNTER — Encounter: Payer: Self-pay | Admitting: Oncology

## 2021-04-15 ENCOUNTER — Other Ambulatory Visit: Payer: Self-pay | Admitting: Oncology

## 2021-04-16 ENCOUNTER — Ambulatory Visit: Payer: Medicare Other | Admitting: Physical Therapy

## 2021-04-16 ENCOUNTER — Encounter: Payer: Self-pay | Admitting: Oncology

## 2021-04-19 ENCOUNTER — Telehealth: Payer: Self-pay | Admitting: *Deleted

## 2021-04-19 NOTE — Telephone Encounter (Signed)
PC to patient's wife Hoyle Sauer - informed her that, per Dr. Alen Blew, patient no longer needs to be taking potassium, his K level was 4.4 on 03/19/21.  She verbalizes understanding.

## 2021-04-19 NOTE — Telephone Encounter (Signed)
-----   Message from [name], MD sent at 04/19/2021 12:31 PM EDT ----- Regarding: RE: Potassium Rx No. His K is back to normal and he will not need it anymore.  ----- Message ----- From: Rolene Course, RN Sent: 04/19/2021  12:28 PM EDT To: [name], MD Subject: Potassium Rx                                   Mr Squier wife called, he needs a refill for his potassium.  It was last prescribed by a hospitalist but she says it was originally prescribed by you.  It this ok to refill?  Thanks, Bethena Roys

## 2021-05-03 ENCOUNTER — Inpatient Hospital Stay: Payer: Medicare Other | Attending: Oncology

## 2021-05-03 ENCOUNTER — Inpatient Hospital Stay (HOSPITAL_BASED_OUTPATIENT_CLINIC_OR_DEPARTMENT_OTHER): Payer: Medicare Other | Admitting: Oncology

## 2021-05-03 ENCOUNTER — Other Ambulatory Visit: Payer: Self-pay

## 2021-05-03 VITALS — BP 122/70 | HR 94 | Resp 17 | Wt 183.0 lb

## 2021-05-03 DIAGNOSIS — E039 Hypothyroidism, unspecified: Secondary | ICD-10-CM | POA: Diagnosis not present

## 2021-05-03 DIAGNOSIS — C7800 Secondary malignant neoplasm of unspecified lung: Secondary | ICD-10-CM | POA: Insufficient documentation

## 2021-05-03 DIAGNOSIS — C641 Malignant neoplasm of right kidney, except renal pelvis: Secondary | ICD-10-CM | POA: Diagnosis not present

## 2021-05-03 DIAGNOSIS — R918 Other nonspecific abnormal finding of lung field: Secondary | ICD-10-CM

## 2021-05-03 DIAGNOSIS — Z923 Personal history of irradiation: Secondary | ICD-10-CM | POA: Diagnosis not present

## 2021-05-03 DIAGNOSIS — Z79899 Other long term (current) drug therapy: Secondary | ICD-10-CM | POA: Diagnosis not present

## 2021-05-03 DIAGNOSIS — R600 Localized edema: Secondary | ICD-10-CM | POA: Insufficient documentation

## 2021-05-03 DIAGNOSIS — C649 Malignant neoplasm of unspecified kidney, except renal pelvis: Secondary | ICD-10-CM

## 2021-05-03 DIAGNOSIS — C7951 Secondary malignant neoplasm of bone: Secondary | ICD-10-CM | POA: Diagnosis present

## 2021-05-03 DIAGNOSIS — N2889 Other specified disorders of kidney and ureter: Secondary | ICD-10-CM

## 2021-05-03 LAB — CBC WITH DIFFERENTIAL (CANCER CENTER ONLY)
Abs Immature Granulocytes: 0.04 10*3/uL (ref 0.00–0.07)
Basophils Absolute: 0.1 10*3/uL (ref 0.0–0.1)
Basophils Relative: 1 %
Eosinophils Absolute: 0.2 10*3/uL (ref 0.0–0.5)
Eosinophils Relative: 2 %
HCT: 36.2 % — ABNORMAL LOW (ref 39.0–52.0)
Hemoglobin: 12.1 g/dL — ABNORMAL LOW (ref 13.0–17.0)
Immature Granulocytes: 1 %
Lymphocytes Relative: 7 %
Lymphs Abs: 0.6 10*3/uL — ABNORMAL LOW (ref 0.7–4.0)
MCH: 31.3 pg (ref 26.0–34.0)
MCHC: 33.4 g/dL (ref 30.0–36.0)
MCV: 93.5 fL (ref 80.0–100.0)
Monocytes Absolute: 0.7 10*3/uL (ref 0.1–1.0)
Monocytes Relative: 8 %
Neutro Abs: 6.9 10*3/uL (ref 1.7–7.7)
Neutrophils Relative %: 81 %
Platelet Count: 242 10*3/uL (ref 150–400)
RBC: 3.87 MIL/uL — ABNORMAL LOW (ref 4.22–5.81)
RDW: 13.9 % (ref 11.5–15.5)
WBC Count: 8.4 10*3/uL (ref 4.0–10.5)
nRBC: 0 % (ref 0.0–0.2)

## 2021-05-03 LAB — CMP (CANCER CENTER ONLY)
ALT: 17 U/L (ref 0–44)
AST: 19 U/L (ref 15–41)
Albumin: 3.1 g/dL — ABNORMAL LOW (ref 3.5–5.0)
Alkaline Phosphatase: 74 U/L (ref 38–126)
Anion gap: 10 (ref 5–15)
BUN: 18 mg/dL (ref 8–23)
CO2: 24 mmol/L (ref 22–32)
Calcium: 9.5 mg/dL (ref 8.9–10.3)
Chloride: 104 mmol/L (ref 98–111)
Creatinine: 1.4 mg/dL — ABNORMAL HIGH (ref 0.61–1.24)
GFR, Estimated: 52 mL/min — ABNORMAL LOW (ref >60)
Glucose, Bld: 142 mg/dL — ABNORMAL HIGH (ref 70–99)
Potassium: 4 mmol/L (ref 3.5–5.1)
Sodium: 138 mmol/L (ref 135–145)
Total Bilirubin: 0.7 mg/dL (ref 0.3–1.2)
Total Protein: 6.5 g/dL (ref 6.5–8.1)

## 2021-05-03 LAB — TSH: TSH: 3.419 u[IU]/mL (ref 0.320–4.118)

## 2021-05-03 NOTE — Progress Notes (Signed)
Hematology and Oncology Follow Up Visit  James Massey [id number] 02-21-1946 75 y.o. 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    Prior Therapy:  He is status post laparoscopic right radical nephrectomy on May 31, 2018.  He is status post right hip and left lung radiation therapy completed in August 2021.  He received 50 Gray in 5 fractions  He is status post radiation to the right posterior acetabular lesion completed in November 2021.  He received at 50 Gray in 5 fractions.  He status post radiation therapy to the right and the left proximal femur for a total of 30 Gray in 10 fractions completed in February 2022.  Current therapy: Cabometyx 40 mg daily started on 05/28/2020.  Nivolumab added on September 20, 2020.  Treatments are currently on hold.   Interim History: [name] presents today for a follow-up visit.  Since the last visit, he reports no major changes in his health.  He continues to improve slowly increase his activity level.  He is ambulating more with the help of walker without any falls or syncope.  He has regained a lot of his strength back.  He had cataract surgery with correction of his vision which has improved his depth perception and mobility.  He denies any worsening bone pain or pathological fractures.  Medications: Updated on review. Current Outpatient Medications  Medication Sig Dispense Refill   allopurinol (ZYLOPRIM) 300 MG tablet Take 300 mg by mouth at bedtime.      amLODipine (NORVASC) 10 MG tablet Take 1 tablet (10 mg total) by mouth daily. 180 tablet 3   atorvastatin (LIPITOR) 40 MG tablet Take 40 mg by mouth at bedtime.      calcium-vitamin D (OSCAL WITH D) 500-200 MG-UNIT tablet Take 2 tablets by mouth 2 (two) times daily. 60 tablet 0   diazepam (VALIUM) 5 MG/ML solution Take 5 mg by mouth every 8  (eight) hours as needed for anxiety.     furosemide (LASIX) 20 MG tablet Take 20 mg by mouth as needed.     hydrocortisone 2.5 % cream Apply 1 application topically 2 (two) times daily as needed (for hemorroidal flare ups).     levothyroxine (SYNTHROID) 50 MCG tablet Take 1 tablet (50 mcg total) by mouth daily before breakfast. 30 tablet 2   magnesium oxide (MAG-OX) 400 (240 Mg) MG tablet Take 1 tablet (400 mg total) by mouth 2 (two) times daily. (Patient not taking: Reported on 02/05/2021) 60 tablet 2   Multiple Vitamin (MULTIVITAMIN) tablet Take 2 tablets by mouth daily.     pantoprazole (PROTONIX) 40 MG tablet Take 40 mg by mouth 2 (two) times daily.     potassium chloride SA (KLOR-CON) 10 MEQ tablet Take 1 tablet (10 mEq total) by mouth 2 (two) times daily. Patient unable to tolerate 20 meq bid, is taking 10 meq bid. 60 tablet 2   Simethicone 180 MG CAPS Take 180 mg by mouth 3 (three) times daily as needed (for gas/indigestion.).     traMADol (ULTRAM) 50 MG tablet TAKE 1 TABLET(50 MG) BY MOUTH EVERY 6 HOURS AS NEEDED 60 tablet 1   No current facility-administered medications for this visit.     Allergies:  Allergies  Allergen Reactions   Bee Venom Swelling   Budesonide-Formoterol Fumarate Other (See Comments)    RESPIRATORY ISSUES   Colchicine Nausea And  Vomiting   Poison [employer] and ivy   Clindamycin/Lincomycin Rash     Physical Exam:  Blood pressure 122/70, pulse 94, resp. rate 17, weight 183 lb (83 kg), SpO2 99 %.           ECOG: 1   General appearance: Comfortable appearing without any discomfort Head: Normocephalic without any trauma Oropharynx: Mucous membranes are moist and pink without any thrush or ulcers. Eyes: Pupils are equal and round reactive to light. Lymph nodes: No cervical, supraclavicular, inguinal or axillary lymphadenopathy.   Heart:regular rate and rhythm.  S1 and S2 without leg edema. Lung: Clear without any rhonchi or  wheezes.  No dullness to percussion. Abdomin: Soft, nontender, nondistended with good bowel sounds.  No hepatosplenomegaly. Musculoskeletal: No joint deformity or effusion.  Full range of motion noted. Neurological: No deficits noted on motor, sensory and deep tendon reflex exam. Skin: No petechial rash or dryness.  Appeared moist.                Lab Results: Lab Results  Component Value Date   WBC 10.6 (H) 03/19/2021   HGB 12.3 (L) 03/19/2021   HCT 37.3 (L) 03/19/2021   MCV 101.1 (H) 03/19/2021   PLT 322 03/19/2021     Chemistry      Component Value Date/Time   NA 136 03/19/2021 1123   K 4.4 03/19/2021 1123   CL 100 03/19/2021 1123   CO2 26 03/19/2021 1123   BUN 19 03/19/2021 1123   CREATININE 1.39 (H) 03/19/2021 1123      Component Value Date/Time   CALCIUM 9.9 03/19/2021 1123   ALKPHOS 82 03/19/2021 1123   AST 24 03/19/2021 1123   ALT 31 03/19/2021 1123   BILITOT 0.5 03/19/2021 1123            Impression and Plan:   76 year old man with:   1.  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   His disease status was updated at this time and treatment choices were reviewed.  Risks and benefits of restarting anticancer treatment were discussed at this time.  He continues to have valuing reasonable quality of life and favors updating his staging scan in 6 weeks and will make a decision accordingly.  Treatment options including single agent Cabometyx versus combination immunotherapy or combination with oral targeted therapy and immunotherapy.  He is agreeable to continue to hold off on treatment and we will update his staging scans before the next visit.     3.  Prognosis and goals of care: Therapy remains palliative although aggressive measures are warranted given his reasonable performance status.     4.  Hypothyroidism: TSH remains manageable at this time without any further intervention.  5.   Lower extremity edema: Appears to be related to antihypertensive medication.  He is on Lasix as needed.  6.  Follow up: In 6 weeks for repeat evaluation and updating staging scans.     30  minutes were spent on this encounter.  The time was dedicated to reviewing laboratory data, disease status update and outlining future plan of care.  [name], MD 10/28/202210:02 AM

## 2021-05-03 NOTE — Addendum Note (Signed)
Addended by: Sharlynn Oliphant A on: 05/03/2021 11:01 AM   Modules accepted: Orders

## 2021-05-04 ENCOUNTER — Other Ambulatory Visit: Payer: Self-pay | Admitting: Oncology

## 2021-05-06 ENCOUNTER — Encounter: Payer: Self-pay | Admitting: Oncology

## 2021-05-06 ENCOUNTER — Ambulatory Visit: Payer: Medicare Other | Admitting: Physical Therapy

## 2021-05-09 ENCOUNTER — Other Ambulatory Visit: Payer: Self-pay

## 2021-05-09 ENCOUNTER — Ambulatory Visit: Payer: Medicare Other | Attending: Physician Assistant | Admitting: Physical Therapy

## 2021-05-09 ENCOUNTER — Encounter: Payer: Self-pay | Admitting: Physical Therapy

## 2021-05-09 DIAGNOSIS — G8929 Other chronic pain: Secondary | ICD-10-CM | POA: Insufficient documentation

## 2021-05-09 DIAGNOSIS — M25561 Pain in right knee: Secondary | ICD-10-CM | POA: Diagnosis present

## 2021-05-09 DIAGNOSIS — M6281 Muscle weakness (generalized): Secondary | ICD-10-CM | POA: Insufficient documentation

## 2021-05-09 DIAGNOSIS — R2681 Unsteadiness on feet: Secondary | ICD-10-CM | POA: Insufficient documentation

## 2021-05-09 DIAGNOSIS — R262 Difficulty in walking, not elsewhere classified: Secondary | ICD-10-CM | POA: Insufficient documentation

## 2021-05-09 DIAGNOSIS — M25551 Pain in right hip: Secondary | ICD-10-CM | POA: Insufficient documentation

## 2021-05-09 DIAGNOSIS — R2689 Other abnormalities of gait and mobility: Secondary | ICD-10-CM | POA: Insufficient documentation

## 2021-05-09 DIAGNOSIS — M25651 Stiffness of right hip, not elsewhere classified: Secondary | ICD-10-CM | POA: Diagnosis present

## 2021-05-09 NOTE — Therapy (Signed)
[facility] [address]  [place], [place], [address] Phone: [phone]   Fax:  [phone]  Physical Therapy Treatment  Patient Details  Name: James Massey MRN: [id number] Date of Birth: August 08, 1945 Referring Provider (PT): Lawson Radar, PA-C   Encounter Date: 05/09/2021   PT End of Session - 05/09/21 1315     Visit Number 15    Number of Visits 27    Date for PT Re-Evaluation 05/21/21    Authorization Type Medicare & BCBS    Progress Note Due on Visit 21   Recert on visit #41 - 2/87/86   PT Start Time 1315    PT Stop Time 1410    PT Time Calculation (min) 55 min    Activity Tolerance Patient tolerated treatment well    Behavior During Therapy [facility] for tasks assessed/performed             Past Medical History:  Diagnosis Date   Anxiety    Barrett esophagus    GERD (gastroesophageal reflux disease)    Gout    Heart murmur    History of anemia    Kidney cancer, primary, 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 to other site [facility])    Mixed hyperlipidemia    Renal cell cancer, right ([place])    Right renal mass    Wears glasses     Past Surgical History:  Procedure Laterality Date   CYSTOSCOPY WITH RETROGRADE PYELOGRAM, URETEROSCOPY AND STENT PLACEMENT Right 05/21/2018   Procedure: RIGHT RETROGRADE PYELOGRAM, RIGHT DIAGNOSTIC URETEROSCOPY AND STENT PLACEMENT;  Surgeon: Ardis Hughs, MD;  Location: [facility];  Service: Urology;  Laterality: Right;   INGUINAL HERNIA REPAIR Bilateral 09/2012   LAPAROSCOPIC NEPHRECTOMY, HAND ASSISTED Right 05/31/2018   Procedure: LAPAROSCOPIC RADICAL RIGHT NEPHRECTOMY;  Surgeon: Ardis Hughs, MD;  Location: [facility];  Service: Urology;  Laterality: Right;    There were no vitals filed for this visit.   Subjective Assessment - 05/09/21 1326     Subjective Pt reports his eye surgery was a complete success with his vision now much clearer. He has kept up with his walking  and some of his HEP from a seated level during the break for his eye surgery. R hip pain is slighter better but knee remains more variable.    Patient is accompained by: Family member   Wife   Patient Stated Goals "ideally to be able to walk w/o having to use the walker"    Currently in Pain? Yes    Pain Score 5     Pain Location Hip    Pain Orientation Right    Pain Descriptors / Indicators Tightness    Pain Type Chronic pain    Pain Frequency Intermittent    Pain Relieving Factors TENS, heating    Pain Score 7    Pain Location Knee    Pain Orientation Right    Pain Descriptors / Indicators Sharp    Pain Type Chronic pain    Pain Frequency Intermittent                [facility] PT Assessment - 05/09/21 1315       Assessment   Medical Diagnosis Deconditioning    Referring Provider (PT) Lawson Radar, PA-C    Next MD Visit 06/25/21      Ambulation/Gait   Ambulation/Gait Assistance 5: Supervision    Gait velocity 2.23 ft/sec      Standardized Balance Assessment  10 Meter Walk 14.69 sec with rollator      Timed Up and Go Test   Normal TUG (seconds) 23.59   with rollator                          [facility] Adult PT Treatment/Exercise - 05/09/21 1315       Ambulation/Gait   Ambulation Distance (Feet) 270 Feet    Assistive device 4-wheeled walker;Rollator    Gait Pattern Step-through pattern;Decreased weight shift to right;Decreased stance time - right;Decreased step length - left;Right flexed knee in stance;Decreased hip/knee flexion - right;Decreased hip/knee flexion - left;Trunk flexed;Decreased dorsiflexion - right   decreased R hip extension & new R foot slap     Exercises   Exercises Knee/Hip      Knee/Hip Exercises: Stretches   Passive Hamstring Stretch Right;Left;3 reps;30 seconds    Passive Hamstring Stretch Limitations R seated hip hinge & R/L seated hip hinge + strap      Knee/Hip Exercises: Aerobic   Nustep L6 x 7 min (UE/LE)      Knee/Hip  Exercises: Seated   Long Arc Quad Right;Left;2 sets;10 reps;Strengthening    Marching Both;10 reps;Strengthening      Modalities   Modalities Iontophoresis      Iontophoresis   Type of Iontophoresis Dexamethasone    Location R greater trochanter    Dose 80 mA-min, 1.0 mL    Time 4-6 hr patch (#2 of 6)                     PT Education - 05/09/21 1408     Education Details review of ionto patch instructions; review/reprint of hamstring stretch with strap    Person(s) Educated Patient    Methods Explanation;Demonstration;Verbal cues;Tactile cues;Handout    Comprehension Verbalized understanding;Verbal cues required;Tactile cues required;Returned demonstration;Need further instruction              PT Short Term Goals - 04/09/21 1500       PT SHORT TERM GOAL #1   Title Patient will be independent with initial HEP    Status Achieved   02/25/21     PT SHORT TERM GOAL #2   Title Patient will demonstrate safe transfer technique and proper gait pattern with 4-wheel rolling walker or LRAD as indicated    Status Achieved   04/09/21   Target Date --      PT SHORT TERM GOAL #3   Title Patient will increase gait speed to >/= 2.0 ft/sec with RW or LRAD to decrease risk for recurrent falls    Baseline 1.64 ft/sec with RW    Status Achieved   04/09/21 - gait speed 2.29 ft/sec with rollator   Target Date --      PT SHORT TERM GOAL #4   Title Determine balance goals based on results of standardized balance testing    Status Achieved   02/11/21              PT Long Term Goals - 05/09/21 1400       PT LONG TERM GOAL #1   Title Patient will be independent with ongoing/advanced HEP for self-management at home in order to build upon functional gains in therapy    Status Partially Met    Target Date 05/21/21      PT LONG TERM GOAL #2   Title Decrease R hip and knee pain by >/= 50% during transitional movements allowing patient increased  ease of mobiliy and transfers     Status On-going    Target Date 05/21/21      PT LONG TERM GOAL #3   Title Patient will demonstrate improved B LE strength to >/= 4+/5 for improved stability and ease of mobility    Status Partially Met    Target Date 05/21/21      PT LONG TERM GOAL #4   Title Patient will increase gait speed to >/= 2.62 ft/sec with rollator, SPC or LRAD to increase safety with community ambulation    Baseline 1.64 ft/sec with RW    Status Partially Met   03/26/21 - 1.96 ft/sec with rollator; 04/09/21 - 2.29 ft/sec with rollator; 05/09/21 - 2.23 ft/sec with rollator   Target Date 05/21/21      PT LONG TERM GOAL #5   Title Patient will demonstrate decreased TUG time to </= 18 sec with SPC or LRAD to decrease risk for falls with transitional mobility    Baseline 26.5 sec with RW    Status Partially Met   03/26/21 - TUG = 22.34 sec with rollator; 05/09/21 - 23.59 sec with rollator   Target Date 05/21/21      PT LONG TERM GOAL #6   Title Patient will improve Berg score to >/= 40/56 to improve safety stability with ADLs in standing and reduce risk for falls    Baseline 26/56    Status Partially Met   03/26/21 - Berg increased to 36/56   Target Date 05/21/21                   Plan - 05/09/21 1410     Clinical Impression Statement Richard returning to PT for first time in almost one month having had to take a break for eye surgery - he notes excellent recovery from eye surgery with greatly improved vision and no further restrictions related to surgery. He states R hip pain is improving but still limits mobility and exercise tolerance. He reports he has kept up with his walking in his home as well as some of the HEP, mostly the seated exercises. Brief reassessment of gait speed and TUG essentially unchanged from levels prior to break from PT with gait pattern also very similar with antalgic pattern on R lacking knee extension in stance with decreased R stance time and decreased L step length with limited R  hip extension but now new R foot slap apparent that was not evident prior to break from PT. Reviewed R HS stretch adding gastroc stretch to promote improved knee extension as well as seated exercises and will attempt to resume progression to more standing stretches and exercises in upcoming visits. Given longer than initially anticipated absence from PT for eye surgery and ongoing deficits, anticipate pt will require recert at end of current POC. Session concluded with 2nd ionto patch as pt reports good benefit from initial patch application.    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); R nephrectomy 2 cancer; OA; gout; GERD    Rehab Potential Good    PT Frequency 2x / week    PT Duration 8 weeks    PT Treatment/Interventions ADLs/Self Care Home Management;Cryotherapy;Moist Heat;DME Instruction;Gait training;Stair training;Functional mobility training;Therapeutic activities;Therapeutic exercise;Balance training;Neuromuscular re-education;Patient/family education;Manual techniques;Passive range of motion;Dry needling;Taping;Vasopneumatic Device;Electrical Stimulation;Iontophoresis 65m/ml Dexamethasone    PT Next Visit Plan assess response to ionto patch; pain management as able for R hip pain PRN - manual therapy and modalities including estim  or ionto if benefit noted; core/lumboplevic and LE flexibility and strengthening - HEP updates as indicated; balance training; gait training to normalize gait pattern and improve stability    PT Home Exercise Plan Access Code: [email or web address] (8/4, updated 8/8, 9/28 & 10/4)    Consulted and Agree with Plan of Care Patient;Family member/caregiver    Family Member Consulted wife - Hoyle Sauer             Patient will benefit from skilled therapeutic intervention in order to improve the following deficits and impairments:  Abnormal gait, Decreased activity tolerance, Decreased balance, Decreased endurance, Decreased knowledge of  precautions, Decreased knowledge of use of DME, Decreased mobility, Decreased range of motion, Decreased safety awareness, Decreased strength, Difficulty walking, Increased fascial restricitons, Increased muscle spasms, Impaired perceived functional ability, Impaired flexibility, Improper body mechanics, Postural dysfunction, Pain  Visit Diagnosis: Muscle weakness (generalized)  Unsteadiness on feet  Other abnormalities of gait and mobility  Difficulty in walking, not elsewhere classified  Chronic pain of right knee  Pain in right hip  Stiffness of right hip, not elsewhere classified     Problem List Patient Active Problem List   Diagnosis Date Noted   B12 deficiency 01/16/2021   Depression 01/16/2021   Hypoalbuminemia 01/16/2021   Generalized weakness 01/16/2021   Hypothyroidism (acquired) 01/13/2021   Altered mental status 03/49/1791   Acute metabolic encephalopathy 50/56/9794   Hypokalemia 01/09/2021   Hypomagnesemia 01/09/2021   Hypocalcemia 01/09/2021   Primary malignant neoplasm of kidney 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 to other site [facility]) 09/05/2020   Goals of care, counseling/discussion 09/05/2020   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 ([place]) 01/31/2020   History of right nephrectomy 08/13/2018   Renal mass, left 05/31/2018   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   Barrett's esophagus 08/30/2015   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    Percival Spanish, PT 05/09/2021, 7:42 PM  [facility] [address]  [place], [place], [address] Phone: [phone]   Fax:  [phone]  Name: Festus Pursel MRN: [id number] Date of Birth: [birth year]

## 2021-05-13 ENCOUNTER — Ambulatory Visit: Payer: Medicare Other | Admitting: Physical Therapy

## 2021-05-13 ENCOUNTER — Encounter: Payer: Self-pay | Admitting: Physical Therapy

## 2021-05-13 ENCOUNTER — Other Ambulatory Visit: Payer: Self-pay

## 2021-05-13 DIAGNOSIS — R262 Difficulty in walking, not elsewhere classified: Secondary | ICD-10-CM

## 2021-05-13 DIAGNOSIS — R2689 Other abnormalities of gait and mobility: Secondary | ICD-10-CM

## 2021-05-13 DIAGNOSIS — M6281 Muscle weakness (generalized): Secondary | ICD-10-CM | POA: Diagnosis not present

## 2021-05-13 DIAGNOSIS — M25561 Pain in right knee: Secondary | ICD-10-CM

## 2021-05-13 DIAGNOSIS — G8929 Other chronic pain: Secondary | ICD-10-CM

## 2021-05-13 DIAGNOSIS — M25551 Pain in right hip: Secondary | ICD-10-CM

## 2021-05-13 DIAGNOSIS — R2681 Unsteadiness on feet: Secondary | ICD-10-CM

## 2021-05-13 NOTE — Therapy (Signed)
[facility] [address]  [place], [place], [address] Phone: [phone]   Fax:  [phone]  Physical Therapy Treatment  Patient Details  Name: James Massey MRN: [id number] Date of Birth: Jan 07, 1946 Referring Provider (PT): Lawson Radar, PA-C   Encounter Date: 05/13/2021   PT End of Session - 05/13/21 1355     Visit Number 16    Number of Visits 27    Date for PT Re-Evaluation 05/21/21    Authorization Type Medicare & BCBS    Progress Note Due on Visit 21   Recert on visit #11 - 0/31/59   PT Start Time 1355    PT Stop Time 1445    PT Time Calculation (min) 50 min    Activity Tolerance Patient tolerated treatment well    Behavior During Therapy [facility] for tasks assessed/performed             Past Medical History:  Diagnosis Date   Anxiety    Barrett esophagus    GERD (gastroesophageal reflux disease)    Gout    Heart murmur    History of anemia    Kidney cancer, primary, 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 to other site [facility])    Mixed hyperlipidemia    Renal cell cancer, right ([place])    Right renal mass    Wears glasses     Past Surgical History:  Procedure Laterality Date   CYSTOSCOPY WITH RETROGRADE PYELOGRAM, URETEROSCOPY AND STENT PLACEMENT Right 05/21/2018   Procedure: RIGHT RETROGRADE PYELOGRAM, RIGHT DIAGNOSTIC URETEROSCOPY AND STENT PLACEMENT;  Surgeon: Ardis Hughs, MD;  Location[facility];  Service: Urology;  Laterality: Right;   INGUINAL HERNIA REPAIR Bilateral 09/2012   LAPAROSCOPIC NEPHRECTOMY, HAND ASSISTED Right 05/31/2018   Procedure: LAPAROSCOPIC RADICAL RIGHT NEPHRECTOMY;  Surgeon: Ardis Hughs, MD;  Location[facility];  Service: Urology;  Laterality: Right;    There were no vitals filed for this visit.   Subjective Assessment - 05/13/21 1404     Subjective Pt not noting as much beneft from the 2nd ionto patch. He reports increased swelling in his R leg over the past  fews days, not improving much with Lasix or compression hose but a little better after sleeping with his feet elevated. Pt reports he does not note the R foot slap at home but does here in the clinic.    Patient is accompained by: Family member   Wife   Patient Stated Goals "ideally to be able to walk w/o having to use the walker"    Currently in Pain? Yes    Pain Score 7     Pain Location Hip    Pain Orientation Right    Pain Descriptors / Indicators Aching    Pain Type Chronic pain    Pain Score 6    Pain Location Knee    Pain Orientation Right    Pain Descriptors / Indicators Aching    Pain Type Chronic pain    Pain Score 5    Pain Location Ankle    Pain Orientation Right    Pain Descriptors / Indicators Shooting    Pain Type Chronic pain                               [facility] Adult PT Treatment/Exercise - 05/13/21 1355       Knee/Hip Exercises: Stretches   Passive Hamstring Stretch Right;Left;3 reps;30 seconds  Passive Hamstring Stretch Limitations R seated hip hinge & R/L seated hip hinge + strap    Hip Flexor Stretch Right;3 reps;30 seconds    Hip Flexor Stretch Limitations standing lunge positionwith rollator      Knee/Hip Exercises: Aerobic   Nustep L6 x 6 min (UE/LE)      Knee/Hip Exercises: Standing   Hip Abduction Right;Left;5 reps;Knee straight;Stengthening    Abduction Limitations UE support on rollator    Hip Extension Right;Left;10 reps;AROM;Stengthening;Knee straight    Extension Limitations UE support on rollator      Manual Therapy   Manual Therapy Soft tissue mobilization    Soft tissue mobilization roller stick to R HS & gastroc during seated stretches; retrograde massage to R lower leg with emphasis on anterior shin over anterior tibialis to reduce muscle inhibition from edema      Ankle Exercises: Seated   Heel Raises Both;10 reps;3 seconds    Toe Raise 10 reps;3 seconds    Toe Raise Limitations PT providing tapping for muscle  facilitation   better ROM following retrograde massage on R                    PT Education - 05/13/21 1440     Education Details Instruction in retrograde massage for edema reduction in R LE    Person(s) Educated Patient;Spouse    Methods Explanation;Demonstration;Handout    Comprehension Verbalized understanding              PT Short Term Goals - 04/09/21 1500       PT SHORT TERM GOAL #1   Title Patient will be independent with initial HEP    Status Achieved   02/25/21     PT SHORT TERM GOAL #2   Title Patient will demonstrate safe transfer technique and proper gait pattern with 4-wheel rolling walker or LRAD as indicated    Status Achieved   04/09/21   Target Date --      PT SHORT TERM GOAL #3   Title Patient will increase gait speed to >/= 2.0 ft/sec with RW or LRAD to decrease risk for recurrent falls    Baseline 1.64 ft/sec with RW    Status Achieved   04/09/21 - gait speed 2.29 ft/sec with rollator   Target Date --      PT SHORT TERM GOAL #4   Title Determine balance goals based on results of standardized balance testing    Status Achieved   02/11/21              PT Long Term Goals - 05/09/21 1400       PT LONG TERM GOAL #1   Title Patient will be independent with ongoing/advanced HEP for self-management at home in order to build upon functional gains in therapy    Status Partially Met    Target Date 05/21/21      PT LONG TERM GOAL #2   Title Decrease R hip and knee pain by >/= 50% during transitional movements allowing patient increased ease of mobiliy and transfers    Status On-going    Target Date 05/21/21      PT LONG TERM GOAL #3   Title Patient will demonstrate improved B LE strength to >/= 4+/5 for improved stability and ease of mobility    Status Partially Met    Target Date 05/21/21      PT LONG TERM GOAL #4   Title Patient will increase gait speed to >/= 2.62  ft/sec with rollator, SPC or LRAD to increase safety with community  ambulation    Baseline 1.64 ft/sec with RW    Status Partially Met   03/26/21 - 1.96 ft/sec with rollator; 04/09/21 - 2.29 ft/sec with rollator; 05/09/21 - 2.23 ft/sec with rollator   Target Date 05/21/21      PT LONG TERM GOAL #5   Title Patient will demonstrate decreased TUG time to </= 18 sec with SPC or LRAD to decrease risk for falls with transitional mobility    Baseline 26.5 sec with RW    Status Partially Met   03/26/21 - TUG = 22.34 sec with rollator; 05/09/21 - 23.59 sec with rollator   Target Date 05/21/21      PT LONG TERM GOAL #6   Title Patient will improve Berg score to >/= 40/56 to improve safety stability with ADLs in standing and reduce risk for falls    Baseline 26/56    Status Partially Met   03/26/21 - Berg increased to 36/56   Target Date 05/21/21                   Plan - 05/13/21 1445     Clinical Impression Statement James Massey reports he did not note as much benefit from the latest ionto patch. Pain remains elevated in R hip, knee and ankle today. Reviewed stretching to promote improved R hip and knee ROM with manual STM using roller stick during stretches. Alternated between seated and standing exercises to promote increased weight bearing tolerance but still limited tolerance for standing weight shift. Limited R anterior tibialis activation likely 2 muscle inhibition from edema resulting in limited DF ROM and foot slap during gait - retrograde massage and tapping for facilitation improving muscle activation with decreased foot slap observed during gait back to waiting area.    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); R nephrectomy 2 cancer; OA; gout; GERD    Rehab Potential Good    PT Frequency 2x / week    PT Duration 8 weeks    PT Treatment/Interventions ADLs/Self Care Home Management;Cryotherapy;Moist Heat;DME Instruction;Gait training;Stair training;Functional mobility training;Therapeutic activities;Therapeutic  exercise;Balance training;Neuromuscular re-education;Patient/family education;Manual techniques;Passive range of motion;Dry needling;Taping;Vasopneumatic Device;Electrical Stimulation;Iontophoresis 52m/ml Dexamethasone    PT Next Visit Plan pain management as able for R hip pain PRN - manual therapy and modalities including estim or ionto if benefit noted; core/lumboplevic and LE flexibility and strengthening - HEP updates as indicated; balance training; gait training to normalize gait pattern and improve stability    PT Home Exercise Plan Access Code: [email or web address] (8/4, updated 8/8, 9/28 & 10/4)    Consulted and Agree with Plan of Care Patient;Family member/caregiver    Family Member Consulted wife - CHoyle Sauer            Patient will benefit from skilled therapeutic intervention in order to improve the following deficits and impairments:  Abnormal gait, Decreased activity tolerance, Decreased balance, Decreased endurance, Decreased knowledge of precautions, Decreased knowledge of use of DME, Decreased mobility, Decreased range of motion, Decreased safety awareness, Decreased strength, Difficulty walking, Increased fascial restricitons, Increased muscle spasms, Impaired perceived functional ability, Impaired flexibility, Improper body mechanics, Postural dysfunction, Pain  Visit Diagnosis: Muscle weakness (generalized)  Unsteadiness on feet  Other abnormalities of gait and mobility  Difficulty in walking, not elsewhere classified  Chronic pain of right knee  Pain in right hip     Problem List Patient Active Problem List  Diagnosis Date Noted   B12 deficiency 01/16/2021   Depression 01/16/2021   Hypoalbuminemia 01/16/2021   Generalized weakness 01/16/2021   Hypothyroidism (acquired) 01/13/2021   Altered mental status 32/06/2481   Acute metabolic encephalopathy 50/09/7046   Hypokalemia 01/09/2021   Hypomagnesemia 01/09/2021   Hypocalcemia 01/09/2021   Primary malignant neoplasm  of kidney 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 to other site [facility]) 09/05/2020   Goals of care, counseling/discussion 09/05/2020   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 ([place]) 01/31/2020   History of right nephrectomy 08/13/2018   Renal mass, left 05/31/2018   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   Barrett's esophagus 08/30/2015   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  [place], [place], [address] Phone: [phone]   Fax:  [phone]  Name: Yang Rack MRN: [id number] Date of Birth: 08/21/1945

## 2021-05-16 ENCOUNTER — Encounter: Payer: Self-pay | Admitting: Physical Therapy

## 2021-05-16 ENCOUNTER — Ambulatory Visit: Payer: Medicare Other | Admitting: Physical Therapy

## 2021-05-16 ENCOUNTER — Other Ambulatory Visit: Payer: Self-pay

## 2021-05-16 DIAGNOSIS — M25651 Stiffness of right hip, not elsewhere classified: Secondary | ICD-10-CM

## 2021-05-16 DIAGNOSIS — M25551 Pain in right hip: Secondary | ICD-10-CM

## 2021-05-16 DIAGNOSIS — G8929 Other chronic pain: Secondary | ICD-10-CM

## 2021-05-16 DIAGNOSIS — R2681 Unsteadiness on feet: Secondary | ICD-10-CM

## 2021-05-16 DIAGNOSIS — M6281 Muscle weakness (generalized): Secondary | ICD-10-CM

## 2021-05-16 DIAGNOSIS — R2689 Other abnormalities of gait and mobility: Secondary | ICD-10-CM

## 2021-05-16 DIAGNOSIS — R262 Difficulty in walking, not elsewhere classified: Secondary | ICD-10-CM

## 2021-05-16 NOTE — Therapy (Signed)
[facility] [address]  [place], [place], [address] Phone: [phone]   Fax:  [phone]  Physical Therapy Treatment  Patient Details  Name: James Massey MRN: [id number] Date of Birth: [birth year] Referring Provider (PT): Lawson Radar, PA-C   Encounter Date: 05/16/2021   PT End of Session - 05/16/21 1401     Visit Number 17    Number of Visits 27    Date for PT Re-Evaluation 05/21/21    Authorization Type Medicare & BCBS    Progress Note Due on Visit 21   Recert on visit #42 - 01/09/22   PT Start Time 1401    PT Stop Time 1448    PT Time Calculation (min) 47 min    Activity Tolerance Patient tolerated treatment well    Behavior During Therapy [facility] for tasks assessed/performed             Past Medical History:  Diagnosis Date   Anxiety    Barrett esophagus    GERD (gastroesophageal reflux disease)    Gout    Heart murmur    History of anemia    Kidney cancer, primary, 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 to other site [facility])    Mixed hyperlipidemia    Renal cell cancer, right ([place])    Right renal mass    Wears glasses     Past Surgical History:  Procedure Laterality Date   CYSTOSCOPY WITH RETROGRADE PYELOGRAM, URETEROSCOPY AND STENT PLACEMENT Right 05/21/2018   Procedure: RIGHT RETROGRADE PYELOGRAM, RIGHT DIAGNOSTIC URETEROSCOPY AND STENT PLACEMENT;  Surgeon: Ardis Hughs, MD;  Location: [facility];  Service: Urology;  Laterality: Right;   INGUINAL HERNIA REPAIR Bilateral 09/2012   LAPAROSCOPIC NEPHRECTOMY, HAND ASSISTED Right 05/31/2018   Procedure: LAPAROSCOPIC RADICAL RIGHT NEPHRECTOMY;  Surgeon: Ardis Hughs, MD;  Location: [facility];  Service: Urology;  Laterality: Right;    There were no vitals filed for this visit.   Subjective Assessment - 05/16/21 1413     Subjective Pt reports increased edema in R distal LE and foot today despite Lasix and compression stocking. States he has to  take the stocking off to come to PT as his shoe will not fit with the stocking on. He reports increased R hip and knee pain earlier today but down to 3-4/10 after using TENS unit.    Patient is accompained by: Family member   Wife   Patient Stated Goals "ideally to be able to walk w/o having to use the walker"    Currently in Pain? Yes    Pain Score 4    3-4/10   Pain Location Hip    Pain Orientation Right    Pain Descriptors / Indicators Aching    Pain Type Chronic pain    Pain Score 4   3-4/10   Pain Location Knee    Pain Orientation Right    Pain Descriptors / Indicators Aching    Pain Type Chronic pain                               [facility] Adult PT Treatment/Exercise - 05/16/21 1401       Ambulation/Gait   Gait Comments decreased R foot slap evident after MT and exercises today      Knee/Hip Exercises: Aerobic   Nustep L6 x 6 min (UE/LE)      Knee/Hip Exercises: Supine   Quad Sets Right;10  reps;AROM;Strengthening    Quad Sets Limitations PT providing gentle longitudinal distraction to promote TKE - limited by inability to fully extend hip    Short Arc Quad Sets Right;10 reps;AROM;Strengthening    Short Arc Quad Sets Limitations over 8" FR    Other Supine Knee/Hip Exercises R ankle pumps x 20 - initially AAROM with tapping to facilitate anterior tibialis, progressing to AROM      Manual Therapy   Manual Therapy Soft tissue mobilization    Soft tissue mobilization retrograde massage to R foot  and lower leg in supine with foot elevated on wedge bolster - emphasis on dorsum of foot and anterior shin over anterior tibialis to reduce muscle inhibition from edema                       PT Short Term Goals - 04/09/21 1500       PT SHORT TERM GOAL #1   Title Patient will be independent with initial HEP    Status Achieved   02/25/21     PT SHORT TERM GOAL #2   Title Patient will demonstrate safe transfer technique and proper gait pattern with  4-wheel rolling walker or LRAD as indicated    Status Achieved   04/09/21   Target Date --      PT SHORT TERM GOAL #3   Title Patient will increase gait speed to >/= 2.0 ft/sec with RW or LRAD to decrease risk for recurrent falls    Baseline 1.64 ft/sec with RW    Status Achieved   04/09/21 - gait speed 2.29 ft/sec with rollator   Target Date --      PT SHORT TERM GOAL #4   Title Determine balance goals based on results of standardized balance testing    Status Achieved   02/11/21              PT Long Term Goals - 05/09/21 1400       PT LONG TERM GOAL #1   Title Patient will be independent with ongoing/advanced HEP for self-management at home in order to build upon functional gains in therapy    Status Partially Met    Target Date 05/21/21      PT LONG TERM GOAL #2   Title Decrease R hip and knee pain by >/= 50% during transitional movements allowing patient increased ease of mobiliy and transfers    Status On-going    Target Date 05/21/21      PT LONG TERM GOAL #3   Title Patient will demonstrate improved B LE strength to >/= 4+/5 for improved stability and ease of mobility    Status Partially Met    Target Date 05/21/21      PT LONG TERM GOAL #4   Title Patient will increase gait speed to >/= 2.62 ft/sec with rollator, SPC or LRAD to increase safety with community ambulation    Baseline 1.64 ft/sec with RW    Status Partially Met   03/26/21 - 1.96 ft/sec with rollator; 04/09/21 - 2.29 ft/sec with rollator; 05/09/21 - 2.23 ft/sec with rollator   Target Date 05/21/21      PT LONG TERM GOAL #5   Title Patient will demonstrate decreased TUG time to </= 18 sec with SPC or LRAD to decrease risk for falls with transitional mobility    Baseline 26.5 sec with RW    Status Partially Met   03/26/21 - TUG = 22.34 sec with rollator; 05/09/21 -  23.59 sec with rollator   Target Date 05/21/21      PT LONG TERM GOAL #6   Title Patient will improve Berg score to >/= 40/56 to improve safety  stability with ADLs in standing and reduce risk for falls    Baseline 26/56    Status Partially Met   03/26/21 - Berg increased to 36/56   Target Date 05/21/21                   Plan - 05/16/21 1421     Clinical Impression Statement James Massey reports pain reduction at R hip and knee from use of TENS unit earlier today at home but more limited with mobility today due to distal R LE and foot edema making it harder to pick up his foot while walking. Performed retrograde massage to R foot and lower leg with emphasis on dorsum of foot and anterior shin over anterior tibialis to reduce muscle inhibition from edema. MT followed by sequential muscle activation/strengthening moving distal to proximal to reinforce circulatory return. PT noting increased ease of donning shoe following MT and therapeutic exercises as well as demonstrating better R foot advancement with improved heel strike and heel-toe progression with minimal to no foot slap with gait back to waiting room.    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); R nephrectomy 2 cancer; OA; gout; GERD    Rehab Potential Good    PT Frequency 2x / week    PT Duration 8 weeks    PT Treatment/Interventions ADLs/Self Care Home Management;Cryotherapy;Moist Heat;DME Instruction;Gait training;Stair training;Functional mobility training;Therapeutic activities;Therapeutic exercise;Balance training;Neuromuscular re-education;Patient/family education;Manual techniques;Passive range of motion;Dry needling;Taping;Vasopneumatic Device;Electrical Stimulation;Iontophoresis 5m/ml Dexamethasone    PT Next Visit Plan Recert; pain management as able for R hip pain PRN - manual therapy and modalities including estim or ionto if benefit noted; core/lumboplevic and LE flexibility and strengthening - HEP updates as indicated; balance training; gait training to normalize gait pattern and improve stability    PT Home Exercise Plan  Access Code: [email or web address] (8/4, updated 8/8, 9/28 & 10/4)    Consulted and Agree with Plan of Care Patient;Family member/caregiver    Family Member Consulted wife - CHoyle Sauer            Patient will benefit from skilled therapeutic intervention in order to improve the following deficits and impairments:  Abnormal gait, Decreased activity tolerance, Decreased balance, Decreased endurance, Decreased knowledge of precautions, Decreased knowledge of use of DME, Decreased mobility, Decreased range of motion, Decreased safety awareness, Decreased strength, Difficulty walking, Increased fascial restricitons, Increased muscle spasms, Impaired perceived functional ability, Impaired flexibility, Improper body mechanics, Postural dysfunction, Pain  Visit Diagnosis: Muscle weakness (generalized)  Unsteadiness on feet  Other abnormalities of gait and mobility  Difficulty in walking, not elsewhere classified  Chronic pain of right knee  Pain in right hip  Stiffness of right hip, not elsewhere classified     Problem List Patient Active Problem List   Diagnosis Date Noted   B12 deficiency 01/16/2021   Depression 01/16/2021   Hypoalbuminemia 01/16/2021   Generalized weakness 01/16/2021   Hypothyroidism (acquired) 01/13/2021   Altered mental status 023/76/2831  Acute metabolic encephalopathy 051/76/1607  Hypokalemia 01/09/2021   Hypomagnesemia 01/09/2021   Hypocalcemia 01/09/2021   Primary malignant neoplasm of kidney 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 to other site ([facility] 09/05/2020   Goals of care, counseling/discussion 09/05/2020   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   [place], [place], [address] Phone: [phone]   Fax:  [phone]  Name: James Massey MRN: [id number] Date of Birth: June 05, 1946

## 2021-05-20 ENCOUNTER — Other Ambulatory Visit (HOSPITAL_COMMUNITY): Payer: Self-pay

## 2021-05-20 ENCOUNTER — Ambulatory Visit: Payer: Medicare Other | Admitting: Physical Therapy

## 2021-05-20 ENCOUNTER — Other Ambulatory Visit: Payer: Self-pay

## 2021-05-20 ENCOUNTER — Encounter: Payer: Self-pay | Admitting: Physical Therapy

## 2021-05-20 DIAGNOSIS — M6281 Muscle weakness (generalized): Secondary | ICD-10-CM

## 2021-05-20 DIAGNOSIS — M25651 Stiffness of right hip, not elsewhere classified: Secondary | ICD-10-CM

## 2021-05-20 DIAGNOSIS — R2689 Other abnormalities of gait and mobility: Secondary | ICD-10-CM

## 2021-05-20 DIAGNOSIS — M25551 Pain in right hip: Secondary | ICD-10-CM

## 2021-05-20 DIAGNOSIS — R262 Difficulty in walking, not elsewhere classified: Secondary | ICD-10-CM

## 2021-05-20 DIAGNOSIS — R2681 Unsteadiness on feet: Secondary | ICD-10-CM

## 2021-05-20 DIAGNOSIS — G8929 Other chronic pain: Secondary | ICD-10-CM

## 2021-05-20 NOTE — Therapy (Signed)
[facility] [address]  [place], [place], [address] Phone: [phone]   Fax:  [phone]  Physical Therapy Treatment / Recert  Patient Details  Name: James Massey MRN: [id number] Date of Birth: 1945/12/11 Referring Provider (PT): James Radar, PA-C  Progress Note  Reporting Period 03/26/2021 to 05/20/2021  See note below for Objective Data and Assessment of Progress/Goals.     Encounter Date: 05/20/2021   PT End of Session - 05/20/21 1314     Visit Number 18    Number of Visits 30    Date for PT Re-Evaluation 07/08/21   POC extended to 7 weeks to accomdate for limited scheduling due to holidays   Authorization Type Medicare & BCBS    Progress Note Due on Visit 28   Recert on visit #38 - [birth year]   PT Start Time 1314    PT Stop Time 1409    PT Time Calculation (min) 55 min    Activity Tolerance Patient tolerated treatment well    Behavior During Therapy WFL for tasks assessed/performed             Past Medical History:  Diagnosis Date   Anxiety    Barrett esophagus    GERD (gastroesophageal reflux disease)    Gout    Heart murmur    History of anemia    Kidney cancer, primary, 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 to other site [facility])    Mixed hyperlipidemia    Renal cell cancer, right ([place])    Right renal mass    Wears glasses     Past Surgical History:  Procedure Laterality Date   CYSTOSCOPY WITH RETROGRADE PYELOGRAM, URETEROSCOPY AND STENT PLACEMENT Right 05/21/2018   Procedure: RIGHT RETROGRADE PYELOGRAM, RIGHT DIAGNOSTIC URETEROSCOPY AND STENT PLACEMENT;  Surgeon: Ardis Hughs, MD;  [facility];  Service: Urology;  Laterality: Right;   INGUINAL HERNIA REPAIR Bilateral 09/2012   LAPAROSCOPIC NEPHRECTOMY, HAND ASSISTED Right 05/31/2018   Procedure: LAPAROSCOPIC RADICAL RIGHT NEPHRECTOMY;  Surgeon: Ardis Hughs, MD;  [facility];  Service: Urology;  Laterality: Right;     There were no vitals filed for this visit.   Subjective Assessment - 05/20/21 1320     Subjective Pt reports he has been trying to keep the swelling in his ankle down by elevating his foot above his heart - notes it helps and even able to put his shoe on with some room to spare but once he starts walking the ankle swells up again.    Patient is accompained by: Family member   Wife   Patient Stated Goals "ideally to be able to walk w/o having to use the walker"    Currently in Pain? Yes    Pain Score 4     Pain Location Hip    Pain Orientation Right    Pain Descriptors / Indicators Aching    Pain Type Chronic pain    Pain Score 4    Pain Location Knee    Pain Orientation Right    Pain Descriptors / Indicators Aching    Pain Type Chronic pain    Pain Score 4    Pain Location Ankle    Pain Orientation Right    Pain Descriptors / Indicators Tightness    Pain Type Chronic pain                [facility] PT Assessment - 05/20/21 1314       Assessment  Medical Diagnosis Deconditioning    Referring Provider (PT) James Radar, PA-C    Onset Date/Surgical Date --   hospitalized in July but declining for a few months prior   Next MD Visit 06/25/21      Prior Function   Level of Independence Independent with basic ADLs;Independent with household mobility with device;Needs assistance with homemaking    Vocation Retired    Leisure play pool, mostly sedentary except exercises/walking with CNA, would like to be able to walk in the neighborhood with his neighbor      Strength   Right Hip Flexion 4-/5    Right Hip Extension 4-/5   in available ROM - unable to extend hip to neutral   Right Hip External Rotation  4-/5    Right Hip Internal Rotation 4/5    Right Hip ABduction 4/5   hip in slight flexion due to limited extension ROM   Right Hip ADduction 4-/5   tested in supine   Left Hip Flexion 4+/5    Left Hip Extension 4/5    Left Hip External Rotation 4+/5    Left Hip  Internal Rotation 4+/5    Left Hip ABduction 4+/5    Left Hip ADduction 4+/5    Right Knee Flexion 4/5    Right Knee Extension 4+/5    Left Knee Flexion 4+/5    Left Knee Extension 5/5    Right Ankle Dorsiflexion 3-/5   limited due to increased edema   Left Ankle Dorsiflexion 4+/5      Ambulation/Gait   Gait velocity 2.00 ft/sec      Standardized Balance Assessment   10 Meter Walk 16.43 sec with rollator      Berg Balance Test   Sit to Stand Able to stand  independently using hands    Standing Unsupported Able to stand safely 2 minutes    Sitting with Back Unsupported but Feet Supported on Floor or Stool Able to sit safely and securely 2 minutes    Stand to Sit Sits safely with minimal use of hands    Transfers Able to transfer safely, definite need of hands    Standing Unsupported with Eyes Closed Able to stand 10 seconds safely    Standing Unsupported with Feet Together Able to place feet together independently and stand for 1 minute with supervision   ~3" btw feet with R foot slightly fwd   From Standing, Reach Forward with Outstretched Arm Can reach forward >12 cm safely (5")    From Standing Position, Pick up Object from Floor Unable to pick up and needs supervision    From Standing Position, Turn to Look Behind Over each Shoulder Turn sideways only but maintains balance    Turn 360 Degrees Needs assistance while turning    Standing Unsupported, Alternately Place Feet on Step/Stool Needs assistance to keep from falling or unable to try    Standing Unsupported, One Foot in Front Able to plae foot ahead of the other independently and hold 30 seconds    Standing on One Leg Unable to try or needs assist to prevent fall    Total Score 34    Berg comment: < 36 high risk for falls (close to 100%)      Timed Up and Go Test   Normal TUG (seconds) 25.97   with rollator                          [facility] Adult PT  Treatment/Exercise - 05/20/21 1314       Exercises    Exercises Knee/Hip      Knee/Hip Exercises: Aerobic   Nustep L6 x 6 min (UE/LE)                       PT Short Term Goals - 04/09/21 1500       PT SHORT TERM GOAL #1   Title Patient will be independent with initial HEP    Status Achieved   02/25/21     PT SHORT TERM GOAL #2   Title Patient will demonstrate safe transfer technique and proper gait pattern with 4-wheel rolling walker or LRAD as indicated    Status Achieved   04/09/21   Target Date --      PT SHORT TERM GOAL #3   Title Patient will increase gait speed to >/= 2.0 ft/sec with RW or LRAD to decrease risk for recurrent falls    Baseline 1.64 ft/sec with RW    Status Achieved   04/09/21 - gait speed 2.29 ft/sec with rollator   Target Date --      PT SHORT TERM GOAL #4   Title Determine balance goals based on results of standardized balance testing    Status Achieved   02/11/21              PT Long Term Goals - 05/20/21 1359       PT LONG TERM GOAL #1   Title Patient will be independent with ongoing/advanced HEP for self-management at home in order to build upon functional gains in therapy    Status Partially Met    Target Date 07/08/21      PT LONG TERM GOAL #2   Title Decrease R hip and knee pain by >/= 50% during transitional movements allowing patient increased ease of mobiliy and transfers    Status On-going   05/20/21 - pt reporting 20% improvement in pain   Target Date 07/08/21      PT LONG TERM GOAL #3   Title Patient will demonstrate improved B LE strength to >/= 4+/5 for improved stability and ease of mobility    Status Partially Met    Target Date 07/08/21      PT LONG TERM GOAL #4   Title Patient will increase gait speed to >/= 2.62 ft/sec with rollator, SPC or LRAD to increase safety with community ambulation    Baseline 1.64 ft/sec with RW    Status Partially Met   03/26/21 - 1.96 ft/sec with rollator; 04/09/21 - 2.29 ft/sec with rollator; 05/09/21 - 2.23 ft/sec with rollator;  05/20/21 - 2.00 ft/sec with rollator   Target Date 07/08/21      PT LONG TERM GOAL #5   Title Patient will demonstrate decreased TUG time to </= 18 sec with SPC or LRAD to decrease risk for falls with transitional mobility    Baseline 26.5 sec with RW    Status Partially Met   03/26/21 - TUG = 22.34 sec with rollator; 05/09/21 - 23.59 sec with rollator; 05/20/21 - 25.97 sec with rollator   Target Date 07/08/21      PT LONG TERM GOAL #6   Title Patient will improve Berg score to >/= 40/56 to improve safety stability with ADLs in standing and reduce risk for falls    Baseline 26/56    Status Partially Met   03/26/21 Merrilee Jansky = 36/56; 05/20/21 - 34/56   Target Date 07/08/21  Plan - 05/20/21 1409     Clinical Impression Statement James Massey reports R hip, knee and ankle pain have been improving since we were able to resume PT following the extended break related to his eye surgery last month however progress has been slower than anticipated during this PT episode due to pain, R LE edema and extended absence from PT. He notes increased fatigue today due to having to walk up to PT clinic rather than ride as Staxi chair not available and as a result his gait speed and TUG times were slightly slower than most recent testing but remain improved over his baseline scores. His Berg score also declined slightly today but remains improved from his baseline. His overall LE strength is improving with exception of decline in R ankle DF strength due to apparent muscle inhibition from increased edema. We have reviewed edema management techniques and I have encouraged him to try to wear his compression hose regularly, especially when coming to PT to allow for improved standing and walking tolerance with creating increased edema from LE being in a dependent position. Given the fact that he missed ~1 month of PT related to his eye surgery and limited tolerance for PT earlier due to increased R hip and  knee pain, LTGs remain only partially met or ongoing, therefore will recommend recert for continued PT 2x/wk x 6-7 weeks.    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); R nephrectomy 2 cancer; OA; gout; GERD    Rehab Potential Good    PT Frequency 2x / week    PT Duration 6 weeks   6-7 weeks   PT Treatment/Interventions ADLs/Self Care Home Management;Cryotherapy;Moist Heat;DME Instruction;Gait training;Stair training;Functional mobility training;Therapeutic activities;Therapeutic exercise;Balance training;Neuromuscular re-education;Patient/family education;Manual techniques;Passive range of motion;Dry needling;Taping;Vasopneumatic Device;Electrical Stimulation;Iontophoresis 75m/ml Dexamethasone    PT Next Visit Plan pain management as able for R hip pain PRN - manual therapy and modalities including estim or ionto if benefit noted; core/lumboplevic and LE flexibility and strengthening - HEP updates as indicated; balance training; gait training to normalize gait pattern and improve stability    PT Home Exercise Plan Access Code: [email or web address] (8/4, updated 8/8, 9/28 & 10/4)    Consulted and Agree with Plan of Care Patient;Family member/caregiver    Family Member Consulted wife - CHoyle Sauer            Patient will benefit from skilled therapeutic intervention in order to improve the following deficits and impairments:  Abnormal gait, Decreased activity tolerance, Decreased balance, Decreased endurance, Decreased knowledge of precautions, Decreased knowledge of use of DME, Decreased mobility, Decreased range of motion, Decreased safety awareness, Decreased strength, Difficulty walking, Increased fascial restricitons, Increased muscle spasms, Impaired perceived functional ability, Impaired flexibility, Improper body mechanics, Postural dysfunction, Pain  Visit Diagnosis: Muscle weakness (generalized)  Unsteadiness on feet  Other abnormalities of gait and  mobility  Difficulty in walking, not elsewhere classified  Chronic pain of right knee  Pain in right hip  Stiffness of right hip, not elsewhere classified     Problem List Patient Active Problem List   Diagnosis Date Noted   B12 deficiency 01/16/2021   Depression 01/16/2021   Hypoalbuminemia 01/16/2021   Generalized weakness 01/16/2021   Hypothyroidism (acquired) 01/13/2021   Altered mental status 080/99/8338  Acute metabolic encephalopathy 025/11/3974  Hypokalemia 01/09/2021   Hypomagnesemia 01/09/2021   Hypocalcemia 01/09/2021   Primary malignant neoplasm of kidney 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 to other site ([facility] 09/05/2020  Goals of care, counseling/discussion 09/05/2020   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  [place], [place], [address] Phone: [phone]   Fax:  [phone]  Name: Vasco Chong MRN: [id number] Date of Birth: Aug 28, 1945

## 2021-05-29 ENCOUNTER — Ambulatory Visit: Payer: Medicare Other | Admitting: Physical Therapy

## 2021-05-29 ENCOUNTER — Encounter: Payer: Self-pay | Admitting: Physical Therapy

## 2021-05-29 ENCOUNTER — Other Ambulatory Visit: Payer: Self-pay

## 2021-05-29 DIAGNOSIS — M25651 Stiffness of right hip, not elsewhere classified: Secondary | ICD-10-CM

## 2021-05-29 DIAGNOSIS — R262 Difficulty in walking, not elsewhere classified: Secondary | ICD-10-CM

## 2021-05-29 DIAGNOSIS — G8929 Other chronic pain: Secondary | ICD-10-CM

## 2021-05-29 DIAGNOSIS — M25551 Pain in right hip: Secondary | ICD-10-CM

## 2021-05-29 DIAGNOSIS — R2681 Unsteadiness on feet: Secondary | ICD-10-CM

## 2021-05-29 DIAGNOSIS — M6281 Muscle weakness (generalized): Secondary | ICD-10-CM

## 2021-05-29 DIAGNOSIS — M25561 Pain in right knee: Secondary | ICD-10-CM

## 2021-05-29 DIAGNOSIS — R2689 Other abnormalities of gait and mobility: Secondary | ICD-10-CM

## 2021-05-29 NOTE — Patient Instructions (Signed)
    Access Code: [email or web address] URL: [email or web address] Date: 05/29/2021 Prepared by: Annie Paras  Exercises Standing Hip Abduction with Counter Support - 1 x daily - 7 x weekly - 2 sets - 10 reps - 2-3 sec hold Standing Hip Extension with Counter Support - 1 x daily - 7 x weekly - 2 sets - 10 reps - 2-3 sec hold Standing March with Counter Support - 1 x daily - 7 x weekly - 2 sets - 10 reps - 2-3 sec hold Standing Gastroc Stretch at Counter - 2-3 x daily - 7 x weekly - 3 reps - 30 sec hold Standing Hip Flexor Stretch - 2 x daily - 7 x weekly - 2 sets - 10 reps - 30 sec hold

## 2021-05-29 NOTE — Therapy (Signed)
[facility] [address]  [place], [place], [address] Phone: [phone]   Fax:  [phone]  Physical Therapy Treatment  Patient Details  Name: James Massey MRN: [id number] Date of Birth: 1945-11-26 Referring Provider (PT): Lawson Radar, PA-C   Encounter Date: 05/29/2021   PT End of Session - 05/29/21 1007     Visit Number 19    Number of Visits 30    Date for PT Re-Evaluation 07/08/21   POC extended to 7 weeks to accomdate for limited scheduling due to holidays   Authorization Type Medicare & BCBS    Progress Note Due on Visit 28   Recert on visit #65 - 03/54/65   PT Start Time 1007    PT Stop Time 1059    PT Time Calculation (min) 52 min    Activity Tolerance Patient tolerated treatment well    Behavior During Therapy [facility] for tasks assessed/performed             Past Medical History:  Diagnosis Date   Anxiety    Barrett esophagus    GERD (gastroesophageal reflux disease)    Gout    Heart murmur    History of anemia    Kidney cancer, primary, 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 to other site [facility])    Mixed hyperlipidemia    Renal cell cancer, right ([place])    Right renal mass    Wears glasses     Past Surgical History:  Procedure Laterality Date   CYSTOSCOPY WITH RETROGRADE PYELOGRAM, URETEROSCOPY AND STENT PLACEMENT Right 05/21/2018   Procedure: RIGHT RETROGRADE PYELOGRAM, RIGHT DIAGNOSTIC URETEROSCOPY AND STENT PLACEMENT;  Surgeon: Ardis Hughs, MD;  Location: [facility];  Service: Urology;  Laterality: Right;   INGUINAL HERNIA REPAIR Bilateral 09/2012   LAPAROSCOPIC NEPHRECTOMY, HAND ASSISTED Right 05/31/2018   Procedure: LAPAROSCOPIC RADICAL RIGHT NEPHRECTOMY;  Surgeon: Ardis Hughs, MD;  Location: [facility];  Service: Urology;  Laterality: Right;    There were no vitals filed for this visit.   Subjective Assessment - 05/29/21 1015     Subjective Pt reports he is still unable to  wear his compression socks with his shoes as they take up too much room in the shoe even when the swelling is down some. He does not think he has any other shoes that would accomodate the compression hose.    Patient is accompained by: --   CNA   Patient Stated Goals "ideally to be able to walk w/o having to use the walker"    Currently in Pain? Yes    Pain Score 0-No pain    Pain Location Hip    Pain Orientation Right    Pain Frequency Intermittent    Pain Score 3   3-4/10   Pain Location Knee    Pain Orientation Right    Pain Type Chronic pain    Pain Frequency Constant    Pain Score 3   3-4/10   Pain Location Ankle    Pain Orientation Right    Pain Type Chronic pain    Pain Frequency Constant                               [facility] Adult PT Treatment/Exercise - 05/29/21 0001       Ambulation/Gait   Ambulation/Gait Assistance 5: Supervision    Ambulation/Gait Assistance Details cues for increased L stride length with increased  R hip extension for increased symmetrical step-through gait pattern    Ambulation Distance (Feet) 180 Feet    Assistive device Rollator    Gait Pattern Step-through pattern;Decreased weight shift to right;Decreased stance time - right;Decreased step length - left;Right flexed knee in stance;Decreased hip/knee flexion - right;Decreased hip/knee flexion - left;Trunk flexed;Decreased dorsiflexion - right   decreased R hip extension     Knee/Hip Exercises: Stretches   Hip Flexor Stretch Right;Left;2 reps;30 seconds    Hip Flexor Stretch Limitations standing lunge position at counter    Gastroc Stretch Right;Left;2 reps;30 seconds    Gastroc Stretch Limitations standing runner's stretch      Knee/Hip Exercises: Aerobic   Nustep L6 x 6 min (UE/LE)      Knee/Hip Exercises: Standing   Hip Flexion Right;Left;10 reps;Stengthening;Knee bent    Hip Flexion Limitations 2# cuff wt - UE support on counter    Hip Abduction Right;Left;10 reps;Knee  straight;Stengthening    Abduction Limitations 2# cuff wt - UE support on counter    Hip Extension Right;Left;10 reps;AROM;Stengthening;Knee straight    Extension Limitations 2# cuff wt - UE support on counter    Other Standing Knee Exercises R/L retro step 2 x 10 with ispilatera hand raise   single UE support on counter   Other Standing Knee Exercises Church pews 2 x 10                     PT Education - 05/29/21 1059     Education Details HEP review/update - standing stretches and exercises - Access Code: [email or web address]    Person(s) Educated Patient;Caregiver(s)    Methods Explanation;Demonstration;Verbal cues;Tactile cues;Handout    Comprehension Verbalized understanding;Verbal cues required;Tactile cues required;Returned demonstration;Need further instruction              PT Short Term Goals - 04/09/21 1500       PT SHORT TERM GOAL #1   Title Patient will be independent with initial HEP    Status Achieved   02/25/21     PT SHORT TERM GOAL #2   Title Patient will demonstrate safe transfer technique and proper gait pattern with 4-wheel rolling walker or LRAD as indicated    Status Achieved   04/09/21   Target Date --      PT SHORT TERM GOAL #3   Title Patient will increase gait speed to >/= 2.0 ft/sec with RW or LRAD to decrease risk for recurrent falls    Baseline 1.64 ft/sec with RW    Status Achieved   04/09/21 - gait speed 2.29 ft/sec with rollator   Target Date --      PT SHORT TERM GOAL #4   Title Determine balance goals based on results of standardized balance testing    Status Achieved   02/11/21              PT Long Term Goals - 05/20/21 1359       PT LONG TERM GOAL #1   Title Patient will be independent with ongoing/advanced HEP for self-management at home in order to build upon functional gains in therapy    Status Partially Met    Target Date 07/08/21      PT LONG TERM GOAL #2   Title Decrease R hip and knee pain by >/= 50% during transitional  movements allowing patient increased ease of mobiliy and transfers    Status On-going   05/20/21 - pt reporting 20% improvement in pain   Target  Date 07/08/21      PT LONG TERM GOAL #3   Title Patient will demonstrate improved B LE strength to >/= 4+/5 for improved stability and ease of mobility    Status Partially Met    Target Date 07/08/21      PT LONG TERM GOAL #4   Title Patient will increase gait speed to >/= 2.62 ft/sec with rollator, SPC or LRAD to increase safety with community ambulation    Baseline 1.64 ft/sec with RW    Status Partially Met   03/26/21 - 1.96 ft/sec with rollator; 04/09/21 - 2.29 ft/sec with rollator; 05/09/21 - 2.23 ft/sec with rollator; 05/20/21 - 2.00 ft/sec with rollator   Target Date 07/08/21      PT LONG TERM GOAL #5   Title Patient will demonstrate decreased TUG time to </= 18 sec with SPC or LRAD to decrease risk for falls with transitional mobility    Baseline 26.5 sec with RW    Status Partially Met   03/26/21 - TUG = 22.34 sec with rollator; 05/09/21 - 23.59 sec with rollator; 05/20/21 - 25.97 sec with rollator   Target Date 07/08/21      PT LONG TERM GOAL #6   Title Patient will improve Berg score to >/= 40/56 to improve safety stability with ADLs in standing and reduce risk for falls    Baseline 26/56    Status Partially Met   03/26/21 - Berg = 36/56; 05/20/21 - 34/56   Target Date 07/08/21                   Plan - 05/29/21 1019     Clinical Impression Statement James Massey reports no pain in R hip today and R knee and ankle pain decreased from normal levels. With pain better controlled today, reviewed and progressed standing exercises clarifying weight shift and movement patterns for church pews and retro step to better promote quad activation, and adding light resistance with 2# cuff wts to standing hip strengthening exercises. Also provided instruction in standing stretching alternatives for gastroc and hip flexors to promote better stride  length and foot clearance with gait. All exercises and stretches well tolerated but intermittent seated rest breaks necessary due to fatigue.    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); R nephrectomy 2 cancer; OA; gout; GERD    Rehab Potential Good    PT Frequency 2x / week    PT Duration 6 weeks   6-7 weeks   PT Treatment/Interventions ADLs/Self Care Home Management;Cryotherapy;Moist Heat;DME Instruction;Gait training;Stair training;Functional mobility training;Therapeutic activities;Therapeutic exercise;Balance training;Neuromuscular re-education;Patient/family education;Manual techniques;Passive range of motion;Dry needling;Taping;Vasopneumatic Device;Electrical Stimulation;Iontophoresis 4mg /ml Dexamethasone    PT Next Visit Plan pain management as able for R hip pain PRN - manual therapy and modalities including estim or ionto if benefit noted; core/lumboplevic and LE flexibility and strengthening - HEP updates as indicated; balance training; gait training to normalize gait pattern and improve stability    PT Home Exercise Plan Access Code: [email or web address] (8/4, updated 8/8, 9/28, 10/4 & 11/23)    Consulted and Agree with Plan of Care Patient;Other (Comment)    Family Member Consulted CNA             Patient will benefit from skilled therapeutic intervention in order to improve the following deficits and impairments:  Abnormal gait, Decreased activity tolerance, Decreased balance, Decreased endurance, Decreased knowledge of precautions, Decreased knowledge of use of DME, Decreased mobility, Decreased range of motion, Decreased  safety awareness, Decreased strength, Difficulty walking, Increased fascial restricitons, Increased muscle spasms, Impaired perceived functional ability, Impaired flexibility, Improper body mechanics, Postural dysfunction, Pain  Visit Diagnosis: Muscle weakness (generalized)  Unsteadiness on feet  Other abnormalities of gait  and mobility  Difficulty in walking, not elsewhere classified  Chronic pain of right knee  Pain in right hip  Stiffness of right hip, not elsewhere classified     Problem List Patient Active Problem List   Diagnosis Date Noted   B12 deficiency 01/16/2021   Depression 01/16/2021   Hypoalbuminemia 01/16/2021   Generalized weakness 01/16/2021   Hypothyroidism (acquired) 01/13/2021   Altered mental status 97/35/3299   Acute metabolic encephalopathy 24/26/8341   Hypokalemia 01/09/2021   Hypomagnesemia 01/09/2021   Hypocalcemia 01/09/2021   Primary malignant neoplasm of kidney 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 to other site 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  [place], [place], [address] Phone: [phone]   Fax:  [phone]  Name: James Massey MRN: [id number] Date of Birth: [birth year]

## 2021-06-04 ENCOUNTER — Telehealth: Payer: Self-pay | Admitting: *Deleted

## 2021-06-04 NOTE — Telephone Encounter (Signed)
Returned PC to patient's wife, James Massey who called earlier asking if patient needed to have CT before his next appointment with Dr. Alen Blew.  Informed her insurance PA has been obtained & CT may be scheduled at any time, number to [employer] given.  She verbalizes understanding.

## 2021-06-06 ENCOUNTER — Encounter: Payer: Self-pay | Admitting: Physical Therapy

## 2021-06-06 ENCOUNTER — Other Ambulatory Visit: Payer: Self-pay

## 2021-06-06 ENCOUNTER — Ambulatory Visit: Payer: Medicare Other | Attending: Physician Assistant | Admitting: Physical Therapy

## 2021-06-06 DIAGNOSIS — M25551 Pain in right hip: Secondary | ICD-10-CM

## 2021-06-06 DIAGNOSIS — R262 Difficulty in walking, not elsewhere classified: Secondary | ICD-10-CM | POA: Diagnosis present

## 2021-06-06 DIAGNOSIS — G8929 Other chronic pain: Secondary | ICD-10-CM

## 2021-06-06 DIAGNOSIS — R2681 Unsteadiness on feet: Secondary | ICD-10-CM | POA: Diagnosis present

## 2021-06-06 DIAGNOSIS — M25651 Stiffness of right hip, not elsewhere classified: Secondary | ICD-10-CM | POA: Diagnosis present

## 2021-06-06 DIAGNOSIS — M25561 Pain in right knee: Secondary | ICD-10-CM | POA: Diagnosis present

## 2021-06-06 DIAGNOSIS — R2689 Other abnormalities of gait and mobility: Secondary | ICD-10-CM | POA: Diagnosis present

## 2021-06-06 DIAGNOSIS — M6281 Muscle weakness (generalized): Secondary | ICD-10-CM

## 2021-06-06 NOTE — Therapy (Signed)
[facility] [address]  [place], [place], [address] Phone: [phone]   Fax:  [phone]  Physical Therapy Treatment  Patient Details  Name: James Massey MRN: [id number] Date of Birth: [birth year] Referring Provider (PT): Lawson Radar, PA-C   Encounter Date: 06/06/2021   PT End of Session - 06/06/21 1026     Visit Number 20    Number of Visits 30    Date for PT Re-Evaluation 07/08/21   POC extended to 7 weeks to accomdate for limited scheduling due to holidays   Authorization Type Medicare & BCBS    Progress Note Due on Visit 28   Recert on visit #93 - 79/02/40   PT Start Time 1026    PT Stop Time 1114    PT Time Calculation (min) 48 min    Activity Tolerance Patient tolerated treatment well    Behavior During Therapy [facility] for tasks assessed/performed             Past Medical History:  Diagnosis Date   Anxiety    Barrett esophagus    GERD (gastroesophageal reflux disease)    Gout    Heart murmur    History of anemia    Kidney cancer, primary, 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 to other site [facility])    Mixed hyperlipidemia    Renal cell cancer, right ([place])    Right renal mass    Wears glasses     Past Surgical History:  Procedure Laterality Date   CYSTOSCOPY WITH RETROGRADE PYELOGRAM, URETEROSCOPY AND STENT PLACEMENT Right 05/21/2018   Procedure: RIGHT RETROGRADE PYELOGRAM, RIGHT DIAGNOSTIC URETEROSCOPY AND STENT PLACEMENT;  Surgeon: Ardis Hughs, MD;  [facility];  Service: Urology;  Laterality: Right;   INGUINAL HERNIA REPAIR Bilateral 09/2012   LAPAROSCOPIC NEPHRECTOMY, HAND ASSISTED Right 05/31/2018   Procedure: LAPAROSCOPIC RADICAL RIGHT NEPHRECTOMY;  Surgeon: Ardis Hughs, MD;  [facility];  Service: Urology;  Laterality: Right;    There were no vitals filed for this visit.   Subjective Assessment - 06/06/21 1034     Subjective Pt reports he has been taking his  Lasix more regularly and his swelling has gone down enough that he was able to wear his compression hose with his shoes all day yesterday ( none today). Decrease in edema seems to be helping with his R LE pain. He states he has been able to do ~30% more walking and exercises at home with good tolerance.    Patient is accompained by: --   [place]   Patient Stated Goals "ideally to be able to walk w/o having to use the walker"    Currently in Pain? Yes    Pain Score 0-No pain    Pain Location Hip    Pain Orientation Right    Pain Score 3    Pain Location Knee    Pain Orientation Right    Pain Descriptors / Indicators Aching    Pain Type Chronic pain    Pain Score 3    Pain Location Ankle    Pain Orientation Right    Pain Descriptors / Indicators Tightness    Pain Type Chronic pain                               [facility] Adult PT Treatment/Exercise - 06/06/21 1026       Knee/Hip Exercises: Stretches   Hip  Flexor Stretch Right;Left;2 reps;30 seconds    Hip Flexor Stretch Limitations standing lunge position at counter x 2; seated lunge position over edge of chair x 2 (2nd rep with Airex pad to elevate seat height); mod thomas x 1 (not well tolerated)    Gastroc Stretch Right;Left;2 reps;30 seconds    Gastroc Stretch Limitations standing runner's stretch      Knee/Hip Exercises: Aerobic   Nustep L6 x 6 min (UE/LE)                 Balance Exercises - 06/06/21 1026       Balance Exercises: Standing   Standing Eyes Opened Narrow base of support (BOS);Solid surface;30 secs;Head turns;5 reps   corner balance progression - arms at side > folded over chest, head turns > tilts > trunk rotation x 5 reps each (discussed progression of foot position from narrow BOS > partial tandem > staggered stance > tandem, but not attempted)   Standing Eyes Closed Narrow base of support (BOS);Solid surface;1 rep;20 secs                PT Education - 06/06/21 1111      Education Details HEP modification - aternative versions for hip flexor stretch - Access Code: [email or web address]; Corner balance progression    Person(s) Educated Patient    Methods Explanation;Demonstration;Verbal cues;Tactile cues;Handout    Comprehension Verbalized understanding;Verbal cues required;Tactile cues required;Returned demonstration;Need further instruction              PT Short Term Goals - 04/09/21 1500       PT SHORT TERM GOAL #1   Title Patient will be independent with initial HEP    Status Achieved   02/25/21     PT SHORT TERM GOAL #2   Title Patient will demonstrate safe transfer technique and proper gait pattern with 4-wheel rolling walker or LRAD as indicated    Status Achieved   04/09/21   Target Date --      PT SHORT TERM GOAL #3   Title Patient will increase gait speed to >/= 2.0 ft/sec with RW or LRAD to decrease risk for recurrent falls    Baseline 1.64 ft/sec with RW    Status Achieved   04/09/21 - gait speed 2.29 ft/sec with rollator   Target Date --      PT SHORT TERM GOAL #4   Title Determine balance goals based on results of standardized balance testing    Status Achieved   02/11/21              PT Long Term Goals - 05/20/21 1359       PT LONG TERM GOAL #1   Title Patient will be independent with ongoing/advanced HEP for self-management at home in order to build upon functional gains in therapy    Status Partially Met    Target Date 07/08/21      PT LONG TERM GOAL #2   Title Decrease R hip and knee pain by >/= 50% during transitional movements allowing patient increased ease of mobiliy and transfers    Status On-going   05/20/21 - pt reporting 20% improvement in pain   Target Date 07/08/21      PT LONG TERM GOAL #3   Title Patient will demonstrate improved B LE strength to >/= 4+/5 for improved stability and ease of mobility    Status Partially Met    Target Date 07/08/21      PT LONG TERM GOAL #4  Title Patient will increase gait  speed to >/= 2.62 ft/sec with rollator, SPC or LRAD to increase safety with community ambulation    Baseline 1.64 ft/sec with RW    Status Partially Met   03/26/21 - 1.96 ft/sec with rollator; 04/09/21 - 2.29 ft/sec with rollator; 05/09/21 - 2.23 ft/sec with rollator; 05/20/21 - 2.00 ft/sec with rollator   Target Date 07/08/21      PT LONG TERM GOAL #5   Title Patient will demonstrate decreased TUG time to </= 18 sec with SPC or LRAD to decrease risk for falls with transitional mobility    Baseline 26.5 sec with RW    Status Partially Met   03/26/21 - TUG = 22.34 sec with rollator; 05/09/21 - 23.59 sec with rollator; 05/20/21 - 25.97 sec with rollator   Target Date 07/08/21      PT LONG TERM GOAL #6   Title Patient will improve Berg score to >/= 40/56 to improve safety stability with ADLs in standing and reduce risk for falls    Baseline 26/56    Status Partially Met   03/26/21 Merrilee Jansky = 36/56; 05/20/21 - 34/56   Target Date 07/08/21                   Plan - 06/06/21 1114     Clinical Impression Statement Richard reports decreasing swelling in R LE from more regular Lasix dosing helping with pain levels and ability to wear compression socks in shoes (although not wearing compression socks today). He reports no issues with standing hip exercises added last visit but has not tried these at home with cuff weights yet - clarified to start with </=2#. He did request clarification of difference between gastroc and hip flexor stretches, therefore both reviewed with alternative versions of hip flexor stretch attempted in seated (no issues other than better tolerated with elevated seat height) and mod Thomas position (no well tolerated). Remainder of session focusing on education in corner balance progression starting with narrow BOS with instructions provided to CNA on sequencing of progression for home carryover).    Comorbidities Metastatic renal cell carcinoma - mets to bone (pelvic & R  acetabulum); R nephrectomy 2 cancer; OA; gout; GERD    Rehab Potential Good    PT Frequency 2x / week    PT Duration 6 weeks   6-7 weeks   PT Treatment/Interventions ADLs/Self Care Home Management;Cryotherapy;Moist Heat;DME Instruction;Gait training;Stair training;Functional mobility training;Therapeutic activities;Therapeutic exercise;Balance training;Neuromuscular re-education;Patient/family education;Manual techniques;Passive range of motion;Dry needling;Taping;Vasopneumatic Device;Electrical Stimulation;Iontophoresis 21m/ml Dexamethasone    PT Next Visit Plan pain management as able for R hip pain PRN - manual therapy and modalities including estim or ionto if benefit noted; core/lumboplevic and LE flexibility and strengthening - HEP updates as indicated; balance training; gait training to normalize gait pattern and improve stability    PT Home Exercise Plan Access Code: [email or web address] (8/4, updated 8/8, 9/28, 10/4, 11/23 & 12/1); corner balance progression    Consulted and Agree with Plan of Care Patient;Other (Comment)    Family Member Consulted [place]            Patient will benefit from skilled therapeutic intervention in order to improve the following deficits and impairments:  Abnormal gait, Decreased activity tolerance, Decreased balance, Decreased endurance, Decreased knowledge of precautions, Decreased knowledge of use of DME, Decreased mobility, Decreased range of motion, Decreased safety awareness, Decreased strength, Difficulty walking, Increased fascial restricitons, Increased muscle spasms, Impaired perceived functional ability, Impaired flexibility,  Improper body mechanics, Postural dysfunction, Pain  Visit Diagnosis: Muscle weakness (generalized)  Unsteadiness on feet  Other abnormalities of gait and mobility  Difficulty in walking, not elsewhere classified  Chronic pain of right knee  Pain in right hip  Stiffness of right hip, not elsewhere  classified     Problem List Patient Active Problem List   Diagnosis Date Noted   B12 deficiency 01/16/2021   Depression 01/16/2021   Hypoalbuminemia 01/16/2021   Generalized weakness 01/16/2021   Hypothyroidism (acquired) 01/13/2021   Altered mental status 58/68/2574   Acute metabolic encephalopathy 93/55/2174   Hypokalemia 01/09/2021   Hypomagnesemia 01/09/2021   Hypocalcemia 01/09/2021   Primary malignant neoplasm of kidney 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 to other site 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  [place], [place], [address] Phone: [phone]   Fax:  [phone]  Name: Riddick Nuon MRN: [id number] Date of Birth: 1945/10/07

## 2021-06-10 ENCOUNTER — Ambulatory Visit: Payer: Medicare Other | Admitting: Physical Therapy

## 2021-06-11 ENCOUNTER — Other Ambulatory Visit: Payer: Self-pay

## 2021-06-11 ENCOUNTER — Ambulatory Visit: Payer: Medicare Other | Admitting: Physical Therapy

## 2021-06-11 ENCOUNTER — Encounter: Payer: Self-pay | Admitting: Physical Therapy

## 2021-06-11 DIAGNOSIS — R262 Difficulty in walking, not elsewhere classified: Secondary | ICD-10-CM

## 2021-06-11 DIAGNOSIS — M6281 Muscle weakness (generalized): Secondary | ICD-10-CM

## 2021-06-11 DIAGNOSIS — R2689 Other abnormalities of gait and mobility: Secondary | ICD-10-CM

## 2021-06-11 DIAGNOSIS — R2681 Unsteadiness on feet: Secondary | ICD-10-CM

## 2021-06-11 DIAGNOSIS — M25651 Stiffness of right hip, not elsewhere classified: Secondary | ICD-10-CM

## 2021-06-11 DIAGNOSIS — G8929 Other chronic pain: Secondary | ICD-10-CM

## 2021-06-11 DIAGNOSIS — M25551 Pain in right hip: Secondary | ICD-10-CM

## 2021-06-11 NOTE — Therapy (Signed)
[facility] [address]  [place], [place], [address] Phone: [phone]   Fax:  [phone]  Physical Therapy Treatment  Patient Details  Name: James Massey MRN: [id number] Date of Birth: 1946-03-05 Referring Provider (PT): Lawson Radar, PA-C   Encounter Date: 06/11/2021   PT End of Session - 06/11/21 1532     Visit Number 21    Number of Visits 30    Date for PT Re-Evaluation 07/08/21   POC extended to 7 weeks to accomdate for limited scheduling due to holidays   Authorization Type Medicare & BCBS    Progress Note Due on Visit 28   Recert on visit #63 - 89/37/34   PT Start Time 1532    PT Stop Time 1620    PT Time Calculation (min) 48 min    Activity Tolerance Patient tolerated treatment well    Behavior During Therapy [facility] for tasks assessed/performed             Past Medical History:  Diagnosis Date   Anxiety    Barrett esophagus    GERD (gastroesophageal reflux disease)    Gout    Heart murmur    History of anemia    Kidney cancer, primary, 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 to other site [facility])    Mixed hyperlipidemia    Renal cell cancer, right ([place])    Right renal mass    Wears glasses     Past Surgical History:  Procedure Laterality Date   CYSTOSCOPY WITH RETROGRADE PYELOGRAM, URETEROSCOPY AND STENT PLACEMENT Right 05/21/2018   Procedure: RIGHT RETROGRADE PYELOGRAM, RIGHT DIAGNOSTIC URETEROSCOPY AND STENT PLACEMENT;  Surgeon: Ardis Hughs, MD;  Location: [facility];  Service: Urology;  Laterality: Right;   INGUINAL HERNIA REPAIR Bilateral 09/2012   LAPAROSCOPIC NEPHRECTOMY, HAND ASSISTED Right 05/31/2018   Procedure: LAPAROSCOPIC RADICAL RIGHT NEPHRECTOMY;  Surgeon: Ardis Hughs, MD;  Location: [facility];  Service: Urology;  Laterality: Right;    There were no vitals filed for this visit.   Subjective Assessment - 06/11/21 1545     Subjective Pt reporting increased discomfort at  hip today due to inclement weather but overall hip, knee and ankle pain only 3/10.    Patient is accompained by: --   Arthur   Patient Stated Goals "ideally to be able to walk w/o having to use the walker"    Currently in Pain? Yes    Pain Score 3     Pain Location Hip    Pain Orientation Right    Pain Descriptors / Indicators Aching    Pain Type Chronic pain    Pain Frequency Intermittent    Pain Score 3    Pain Location Knee    Pain Orientation Right    Pain Descriptors / Indicators Aching    Pain Type Chronic pain    Pain Frequency Constant    Pain Score 3    Pain Location Ankle    Pain Orientation Right    Pain Descriptors / Indicators Tightness    Pain Type Chronic pain    Pain Frequency Constant                               [facility] Adult PT Treatment/Exercise - 06/11/21 1532       Ambulation/Gait   Ambulation/Gait Assistance 5: Supervision    Ambulation/Gait Assistance Details cues for increased R hip  and knee flexion with heel strike on weight acceptance for improved R foot clearance along with  increased L stride length with increased R hip extension for increased symmetrical step-through gait pattern    Ambulation Distance (Feet) 150 Feet    Assistive device Rollator    Gait Pattern Step-through pattern;Decreased weight shift to right;Decreased stance time - right;Decreased step length - left;Right flexed knee in stance;Decreased hip/knee flexion - right;Decreased hip/knee flexion - left;Trunk flexed;Decreased dorsiflexion - right;Poor foot clearance - right   decreased R hip extension     Knee/Hip Exercises: Aerobic   Nustep L6 x 6 min (UE/LE)      Knee/Hip Exercises: Seated   Ball Squeeze 10 x 5"    Clamshell with TheraBand Red   10 x 3" - alt hipABD/ER; resistance still challenging on R, but getting easy on L   Marching Both;10 reps;Strengthening    Marching Limitations looped green TB at knees    Hamstring Curl Right;Left;2 sets;10  reps;Strengthening    Hamstring Limitations looped green TB on heel anchored by PT                 Balance Exercises - 06/11/21 1532       Balance Exercises: Standing   Standing Eyes Opened Narrow base of support (BOS);Solid surface;30 secs;Head turns;5 reps   corner balance progression - arms at side > folded over chest, head turns > tilts > trunk rotation x 5 reps each (discussed progression of foot position from narrow BOS > partial tandem > staggered stance > tandem, but not attempted)   Standing Eyes Closed Narrow base of support (BOS);Solid surface;1 rep;20 secs                  PT Short Term Goals - 04/09/21 1500       PT SHORT TERM GOAL #1   Title Patient will be independent with initial HEP    Status Achieved   02/25/21     PT SHORT TERM GOAL #2   Title Patient will demonstrate safe transfer technique and proper gait pattern with 4-wheel rolling walker or LRAD as indicated    Status Achieved   04/09/21   Target Date --      PT SHORT TERM GOAL #3   Title Patient will increase gait speed to >/= 2.0 ft/sec with RW or LRAD to decrease risk for recurrent falls    Baseline 1.64 ft/sec with RW    Status Achieved   04/09/21 - gait speed 2.29 ft/sec with rollator   Target Date --      PT SHORT TERM GOAL #4   Title Determine balance goals based on results of standardized balance testing    Status Achieved   02/11/21              PT Long Term Goals - 06/11/21 1547       PT LONG TERM GOAL #1   Title Patient will be independent with ongoing/advanced HEP for self-management at home in order to build upon functional gains in therapy    Status Partially Met    Target Date 07/08/21      PT LONG TERM GOAL #2   Title Decrease R hip and knee pain by >/= 50% during transitional movements allowing patient increased ease of mobiliy and transfers    Status Partially Met   06/11/21 - pt reporting 30-40% improvement in pain   Target Date 07/08/21      PT LONG TERM GOAL  #  3   Title Patient will demonstrate improved B LE strength to >/= 4+/5 for improved stability and ease of mobility    Status Partially Met    Target Date 07/08/21      PT LONG TERM GOAL #4   Title Patient will increase gait speed to >/= 2.62 ft/sec with rollator, SPC or LRAD to increase safety with community ambulation    Baseline 1.64 ft/sec with RW    Status Partially Met   03/26/21 - 1.96 ft/sec with rollator; 04/09/21 - 2.29 ft/sec with rollator; 05/09/21 - 2.23 ft/sec with rollator; 05/20/21 - 2.00 ft/sec with rollator   Target Date 07/08/21      PT LONG TERM GOAL #5   Title Patient will demonstrate decreased TUG time to </= 18 sec with SPC or LRAD to decrease risk for falls with transitional mobility    Baseline 26.5 sec with RW    Status Partially Met   03/26/21 - TUG = 22.34 sec with rollator; 05/09/21 - 23.59 sec with rollator; 05/20/21 - 25.97 sec with rollator   Target Date 07/08/21      PT LONG TERM GOAL #6   Title Patient will improve Berg score to >/= 40/56 to improve safety stability with ADLs in standing and reduce risk for falls    Baseline 26/56    Status Partially Met   03/26/21 Merrilee Jansky = 36/56; 05/20/21 - 34/56   Target Date 07/08/21                   Plan - 06/11/21 1620     Clinical Impression Statement James Massey reports 30-40% improvement in overall R hip and knee pain despite increased discomfort due to inclement weather today. He describes his HEP routine with the assistance of his CNA as consisting of a 10-minute warm-up (walking ~12 laps around house) followed by 45 minutes of PT provided exercises and concluded with another 5 minutes of walking laps around the house as a cool-down. He has not attempted the corner balance exercise progression demonstrated last visit while the CNA was in attendance and requested review today - able to perform full progression with eyes open and narrow BOS but unable to progress beyond static stance with eyes closed and to fatigued  with standing to attempt progression of BOS. He reports no issues with the HEP exercises, now more commonly using the adjustable cuff weights than the therabands, but thinks he may be able to progress theraband resistance with some of the exercises - able to increase to green TB for seated march and HS curls but opting to stay with red TB for seated clams due to R sided weakness.    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); R nephrectomy 2 cancer; OA; gout; GERD    Rehab Potential Good    PT Frequency 2x / week    PT Duration 6 weeks   6-7 weeks   PT Treatment/Interventions ADLs/Self Care Home Management;Cryotherapy;Moist Heat;DME Instruction;Gait training;Stair training;Functional mobility training;Therapeutic activities;Therapeutic exercise;Balance training;Neuromuscular re-education;Patient/family education;Manual techniques;Passive range of motion;Dry needling;Taping;Vasopneumatic Device;Electrical Stimulation;Iontophoresis 14m/ml Dexamethasone    PT Next Visit Plan pain management as able for R hip pain PRN - manual therapy and modalities including estim or ionto if benefit noted; core/lumboplevic and LE flexibility and strengthening - HEP updates as indicated; balance training; gait training to normalize gait pattern and improve stability    PT Home Exercise Plan Access Code: [email or web address] (8/4, updated 8/8, 9/28, 10/4, 11/23 &  12/1); corner balance progression (12/1)    Consulted and Agree with Plan of Care Patient;Other (Comment)    Family Member Consulted CNA - Elizabeth             Patient will benefit from skilled therapeutic intervention in order to improve the following deficits and impairments:  Abnormal gait, Decreased activity tolerance, Decreased balance, Decreased endurance, Decreased knowledge of precautions, Decreased knowledge of use of DME, Decreased mobility, Decreased range of motion, Decreased safety awareness, Decreased strength,  Difficulty walking, Increased fascial restricitons, Increased muscle spasms, Impaired perceived functional ability, Impaired flexibility, Improper body mechanics, Postural dysfunction, Pain  Visit Diagnosis: Muscle weakness (generalized)  Unsteadiness on feet  Other abnormalities of gait and mobility  Difficulty in walking, not elsewhere classified  Chronic pain of right knee  Pain in right hip  Stiffness of right hip, not elsewhere classified     Problem List Patient Active Problem List   Diagnosis Date Noted   B12 deficiency 01/16/2021   Depression 01/16/2021   Hypoalbuminemia 01/16/2021   Generalized weakness 01/16/2021   Hypothyroidism (acquired) 01/13/2021   Altered mental status 88/91/6945   Acute metabolic encephalopathy 03/88/8280   Hypokalemia 01/09/2021   Hypomagnesemia 01/09/2021   Hypocalcemia 01/09/2021   Primary malignant neoplasm of kidney 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 to other site [facility]) 09/05/2020   Goals of care, counseling/discussion 09/05/2020   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  [place], [place], [address] Phone: [phone]   Fax:  [phone]  Name: Dashawn Bartnick MRN: [id number] Date of Birth: 1946-01-04

## 2021-06-13 ENCOUNTER — Ambulatory Visit (HOSPITAL_COMMUNITY): Payer: Medicare Other

## 2021-06-17 ENCOUNTER — Other Ambulatory Visit: Payer: Self-pay

## 2021-06-17 ENCOUNTER — Ambulatory Visit: Payer: Medicare Other | Admitting: Physical Therapy

## 2021-06-17 ENCOUNTER — Encounter: Payer: Self-pay | Admitting: Physical Therapy

## 2021-06-17 DIAGNOSIS — M6281 Muscle weakness (generalized): Secondary | ICD-10-CM | POA: Diagnosis not present

## 2021-06-17 DIAGNOSIS — R2681 Unsteadiness on feet: Secondary | ICD-10-CM

## 2021-06-17 DIAGNOSIS — R2689 Other abnormalities of gait and mobility: Secondary | ICD-10-CM

## 2021-06-17 DIAGNOSIS — M25551 Pain in right hip: Secondary | ICD-10-CM

## 2021-06-17 DIAGNOSIS — R262 Difficulty in walking, not elsewhere classified: Secondary | ICD-10-CM

## 2021-06-17 DIAGNOSIS — G8929 Other chronic pain: Secondary | ICD-10-CM

## 2021-06-17 DIAGNOSIS — M25561 Pain in right knee: Secondary | ICD-10-CM

## 2021-06-17 DIAGNOSIS — M25651 Stiffness of right hip, not elsewhere classified: Secondary | ICD-10-CM

## 2021-06-17 NOTE — Therapy (Signed)
[facility] [address]  [place], [place], [address] Phone: [phone]   Fax:  [phone]  Physical Therapy Treatment  Patient Details  Name: James Massey MRN: [id number] Date of Birth: 02/23/1946 Referring Provider (PT): Lawson Radar, PA-C   Encounter Date: 06/17/2021   PT End of Session - 06/17/21 1532     Visit Number 22    Number of Visits 30    Date for PT Re-Evaluation 07/08/21   POC extended to 7 weeks to accomdate for limited scheduling due to holidays   Authorization Type Medicare & BCBS    Progress Note Due on Visit 28   Recert on visit #60 - 63/01/60   PT Start Time 1532    PT Stop Time 1619    PT Time Calculation (min) 47 min    Activity Tolerance Patient tolerated treatment well;Patient limited by pain    Behavior During Therapy [facility] for tasks assessed/performed             Past Medical History:  Diagnosis Date   Anxiety    Barrett esophagus    GERD (gastroesophageal reflux disease)    Gout    Heart murmur    History of anemia    Kidney cancer, primary, 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 to other site [facility])    Mixed hyperlipidemia    Renal cell cancer, right ([place])    Right renal mass    Wears glasses     Past Surgical History:  Procedure Laterality Date   CYSTOSCOPY WITH RETROGRADE PYELOGRAM, URETEROSCOPY AND STENT PLACEMENT Right 05/21/2018   Procedure: RIGHT RETROGRADE PYELOGRAM, RIGHT DIAGNOSTIC URETEROSCOPY AND STENT PLACEMENT;  Surgeon: Ardis Hughs, MD;  [facility];  Service: Urology;  Laterality: Right;   INGUINAL HERNIA REPAIR Bilateral 09/2012   LAPAROSCOPIC NEPHRECTOMY, HAND ASSISTED Right 05/31/2018   Procedure: LAPAROSCOPIC RADICAL RIGHT NEPHRECTOMY;  Surgeon: Ardis Hughs, MD;  [facility];  Service: Urology;  Laterality: Right;    There were no vitals filed for this visit.   Subjective Assessment - 06/17/21 1541     Subjective Pt reports  increased R hip and knee pain today w/o known trigger. Able to wear his compression socks with his shoes for the first time today.    Patient is accompained by: --   [place]   Patient Stated Goals "ideally to be able to walk w/o having to use the walker"    Currently in Pain? Yes    Pain Score 5     Pain Location Hip    Pain Orientation Right    Pain Descriptors / Indicators Constant;Nagging   "since I got up this morning"   Pain Frequency Intermittent    Pain Score 5    Pain Location Knee    Pain Orientation Right    Pain Descriptors / Indicators Nagging    Pain Type Chronic pain    Pain Score 0    Pain Location Ankle    Pain Orientation Right                               [facility] Adult PT Treatment/Exercise - 06/17/21 1532       Knee/Hip Exercises: Stretches   Passive Hamstring Stretch Right;Left;3 reps;30 seconds    Passive Hamstring Stretch Limitations seated hip hinge + strap    Hip Flexor Stretch Right   ~6-7 minutes  Hip Flexor Stretch Limitations LLLD stretch in seated lunge position with PT applyinf gentle overpressure and gradually moving hip into increased extension while performing STM & TPR to R hips flexors & TFL      Knee/Hip Exercises: Aerobic   Nustep L6 x 7 min (UE/LE)      Knee/Hip Exercises: Seated   Other Seated Knee/Hip Exercises R/L Fitter (1 black/1 blue) leg press x 20      Manual Therapy   Manual Therapy Soft tissue mobilization;Myofascial release    Soft tissue mobilization STM/DTM to R TFL and distal hip flexors    Myofascial Release manual TPR to R TFL and iliopsoas                       PT Short Term Goals - 04/09/21 1500       PT SHORT TERM GOAL #1   Title Patient will be independent with initial HEP    Status Achieved   02/25/21     PT SHORT TERM GOAL #2   Title Patient will demonstrate safe transfer technique and proper gait pattern with 4-wheel rolling walker or LRAD as indicated    Status  Achieved   04/09/21   Target Date --      PT SHORT TERM GOAL #3   Title Patient will increase gait speed to >/= 2.0 ft/sec with RW or LRAD to decrease risk for recurrent falls    Baseline 1.64 ft/sec with RW    Status Achieved   04/09/21 - gait speed 2.29 ft/sec with rollator   Target Date --      PT SHORT TERM GOAL #4   Title Determine balance goals based on results of standardized balance testing    Status Achieved   02/11/21              PT Long Term Goals - 06/11/21 1547       PT LONG TERM GOAL #1   Title Patient will be independent with ongoing/advanced HEP for self-management at home in order to build upon functional gains in therapy    Status Partially Met    Target Date 07/08/21      PT LONG TERM GOAL #2   Title Decrease R hip and knee pain by >/= 50% during transitional movements allowing patient increased ease of mobiliy and transfers    Status Partially Met   06/11/21 - pt reporting 30-40% improvement in pain   Target Date 07/08/21      PT LONG TERM GOAL #3   Title Patient will demonstrate improved B LE strength to >/= 4+/5 for improved stability and ease of mobility    Status Partially Met    Target Date 07/08/21      PT LONG TERM GOAL #4   Title Patient will increase gait speed to >/= 2.62 ft/sec with rollator, SPC or LRAD to increase safety with community ambulation    Baseline 1.64 ft/sec with RW    Status Partially Met   03/26/21 - 1.96 ft/sec with rollator; 04/09/21 - 2.29 ft/sec with rollator; 05/09/21 - 2.23 ft/sec with rollator; 05/20/21 - 2.00 ft/sec with rollator   Target Date 07/08/21      PT LONG TERM GOAL #5   Title Patient will demonstrate decreased TUG time to </= 18 sec with SPC or LRAD to decrease risk for falls with transitional mobility    Baseline 26.5 sec with RW    Status Partially Met   03/26/21 - TUG =  22.34 sec with rollator; 05/09/21 - 23.59 sec with rollator; 05/20/21 - 25.97 sec with rollator   Target Date 07/08/21      PT LONG TERM GOAL  #6   Title Patient will improve Berg score to >/= 40/56 to improve safety stability with ADLs in standing and reduce risk for falls    Baseline 26/56    Status Partially Met   03/26/21 Merrilee Jansky = 36/56; 05/20/21 - 34/56   Target Date 07/08/21                   Plan - 06/17/21 1619     Clinical Impression Statement James Massey was moving more slowly and reports increased R hip and knee pain today w/o known trigger. Due to increased pain, he requested to work mostly from sitting today with primary emphasis on stretching and MT for STM and TPR to address pain. Pt notes best relief from LLLD R hip flexor/anterior hip stretch with gentle overpressure with pain reduced by at least 1/3 per pt report by end of session. Discussed options to increase ease of self-performance at home such as placing foot on a towel on the floor while sliding his foot back as pt reports difficulty maneuvering his leg into the stretch position on his own. Will hopefully be able to resume more standing strengthening and balance next visit as pain allows.    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); R nephrectomy 2 cancer; OA; gout; GERD    Rehab Potential Good    PT Frequency 2x / week    PT Duration 6 weeks   6-7 weeks   PT Treatment/Interventions ADLs/Self Care Home Management;Cryotherapy;Moist Heat;DME Instruction;Gait training;Stair training;Functional mobility training;Therapeutic activities;Therapeutic exercise;Balance training;Neuromuscular re-education;Patient/family education;Manual techniques;Passive range of motion;Dry needling;Taping;Vasopneumatic Device;Electrical Stimulation;Iontophoresis 65m/ml Dexamethasone    PT Next Visit Plan pain management as able for R hip pain PRN - manual therapy and modalities including estim or ionto if benefit noted; core/lumboplevic and LE flexibility and strengthening - HEP updates as indicated; balance training; gait training to normalize  gait pattern and improve stability    PT Home Exercise Plan Access Code: [email or web address] (8/4, updated 8/8, 9/28, 10/4, 11/23 & 12/1); corner balance progression (12/1)    Consulted and Agree with Plan of Care Patient;Other (Comment)    Family Member Consulted CNA - Elizabeth             Patient will benefit from skilled therapeutic intervention in order to improve the following deficits and impairments:  Abnormal gait, Decreased activity tolerance, Decreased balance, Decreased endurance, Decreased knowledge of precautions, Decreased knowledge of use of DME, Decreased mobility, Decreased range of motion, Decreased safety awareness, Decreased strength, Difficulty walking, Increased fascial restricitons, Increased muscle spasms, Impaired perceived functional ability, Impaired flexibility, Improper body mechanics, Postural dysfunction, Pain  Visit Diagnosis: Muscle weakness (generalized)  Unsteadiness on feet  Other abnormalities of gait and mobility  Difficulty in walking, not elsewhere classified  Chronic pain of right knee  Pain in right hip  Stiffness of right hip, not elsewhere classified     Problem List Patient Active Problem List   Diagnosis Date Noted   B12 deficiency 01/16/2021   Depression 01/16/2021   Hypoalbuminemia 01/16/2021   Generalized weakness 01/16/2021   Hypothyroidism (acquired) 01/13/2021   Altered mental status 073/41/9379  Acute metabolic encephalopathy 002/40/9735  Hypokalemia 01/09/2021   Hypomagnesemia 01/09/2021   Hypocalcemia 01/09/2021   Primary malignant neoplasm of kidney with metastasis from  kidney to other site [facility]) 09/05/2020   Goals of care, counseling/discussion 09/05/2020   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 ([place]) 01/31/2020   History of right nephrectomy 08/13/2018   Renal mass, left 05/31/2018   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   Barrett's esophagus 08/30/2015   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  [place], [place], [address] Phone: [phone]   Fax:  [phone]  Name: James Massey MRN: [id number] Date of Birth: 1946-07-03

## 2021-06-20 ENCOUNTER — Ambulatory Visit: Payer: Medicare Other | Admitting: Physical Therapy

## 2021-06-20 ENCOUNTER — Other Ambulatory Visit: Payer: Self-pay | Admitting: Oncology

## 2021-06-21 ENCOUNTER — Ambulatory Visit (HOSPITAL_COMMUNITY): Admission: RE | Admit: 2021-06-21 | Payer: Medicare Other | Source: Ambulatory Visit

## 2021-06-22 ENCOUNTER — Other Ambulatory Visit: Payer: Self-pay

## 2021-06-22 ENCOUNTER — Ambulatory Visit (HOSPITAL_COMMUNITY)
Admission: RE | Admit: 2021-06-22 | Discharge: 2021-06-22 | Disposition: A | Discharge status: Home / Self Care | Payer: Medicare Other | Source: Ambulatory Visit | Attending: Oncology | Admitting: Oncology

## 2021-06-22 DIAGNOSIS — R918 Other nonspecific abnormal finding of lung field: Secondary | ICD-10-CM | POA: Insufficient documentation

## 2021-06-22 DIAGNOSIS — C649 Malignant neoplasm of unspecified kidney, except renal pelvis: Secondary | ICD-10-CM | POA: Diagnosis not present

## 2021-06-22 DIAGNOSIS — N2889 Other specified disorders of kidney and ureter: Secondary | ICD-10-CM

## 2021-06-22 IMAGING — CT CT ABD-PELV W/O CM
2 of 4 series · 12 of 36 positions shown, 15 images · non-contrast
Comparison: Prior CTs [DATE] and [DATE]

CLINICAL DATA: Metastatic renal cell carcinoma.

EXAM:
CT CHEST, ABDOMEN AND PELVIS WITHOUT CONTRAST
TECHNIQUE: Multidetector CT imaging of the chest, abdomen and pelvis was
performed following the standard protocol without IV contrast.

[Series 2: cap w/o · axial · non-contrast · 0.82mm/px · z∈[-264,+261]mm · 9 of 127 slices shown, 12 images]
[im 11/127  mediastinal]
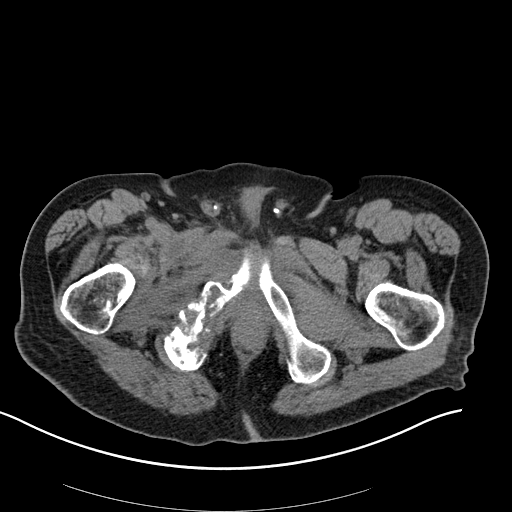
[im 11/127  lung]
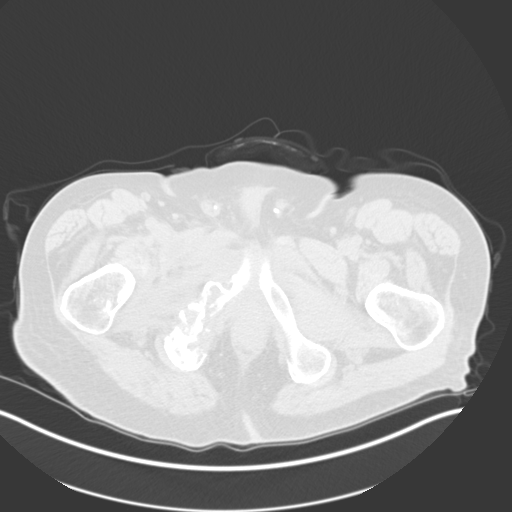
[im 22/127  lung]
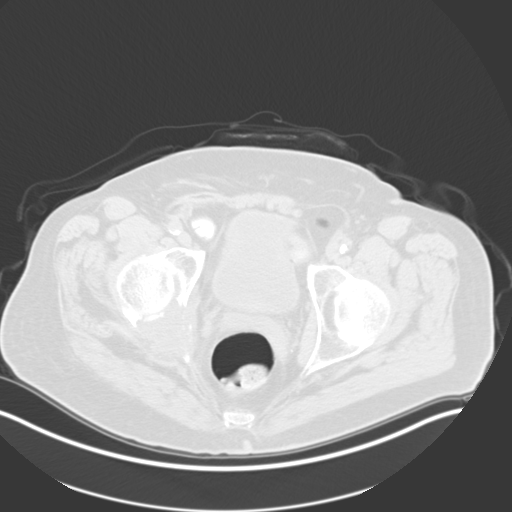
[im 43/127  lung]
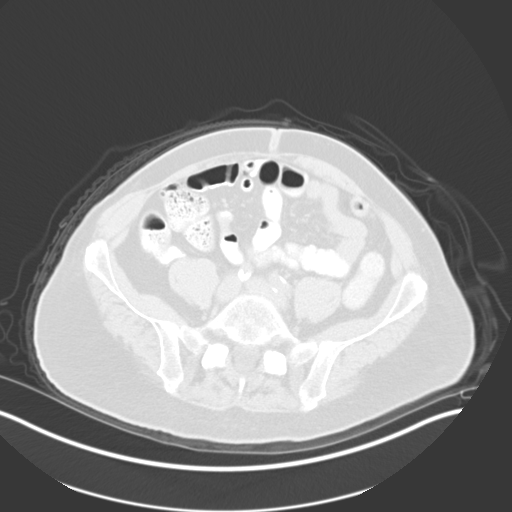
[im 53/127  lung]
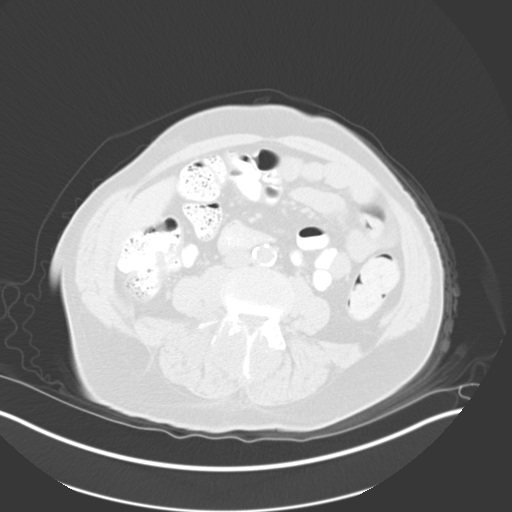
[im 64/127  mediastinal]
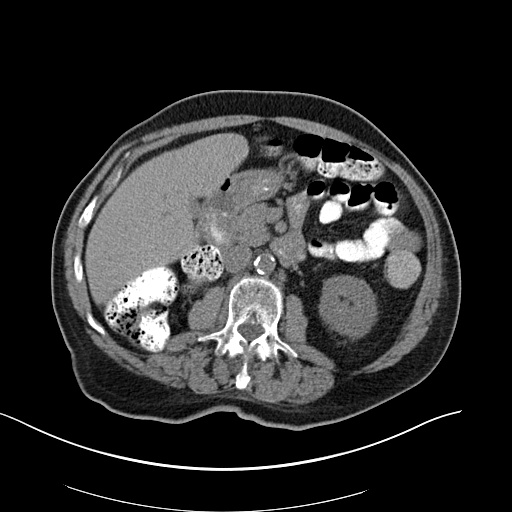
[im 64/127  lung]
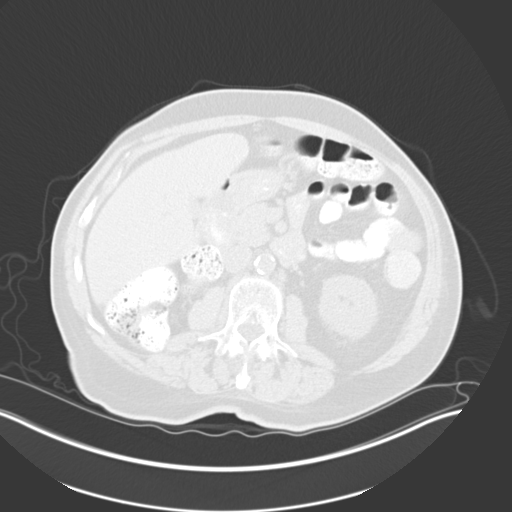
[im 74/127  lung]
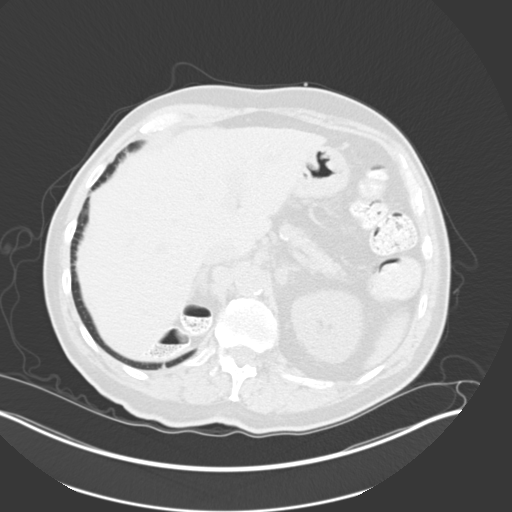
[im 85/127  lung]
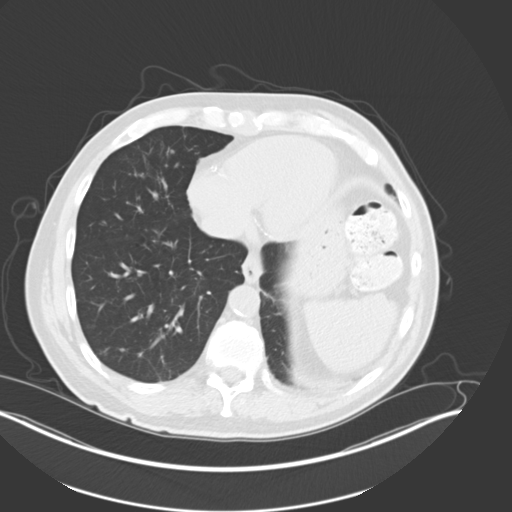
[im 106/127  lung]
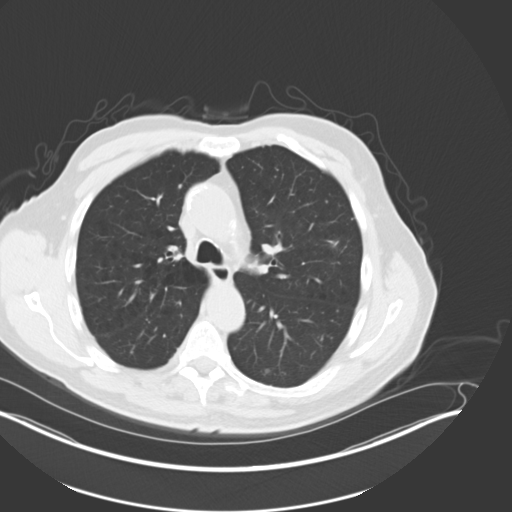
[im 116/127  mediastinal]
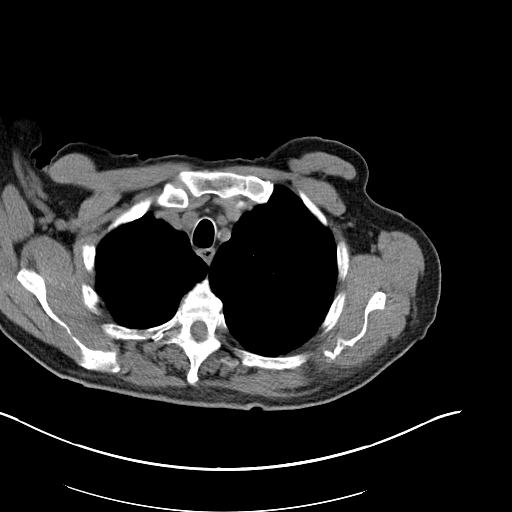
[im 116/127  lung]
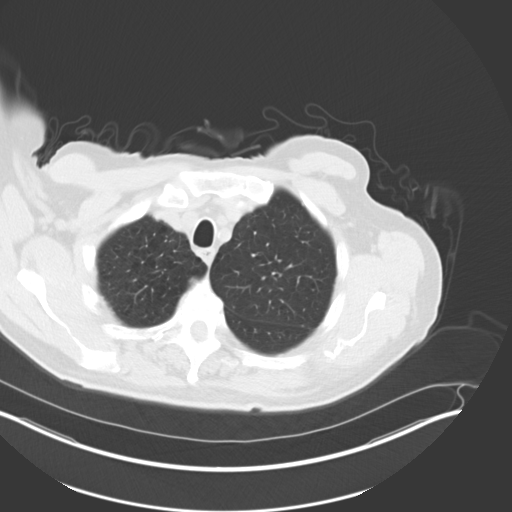

[Series 3: coronals · coronal · 0.82mm/px · 3 of 157 slices shown]
[im 32/157  lung]
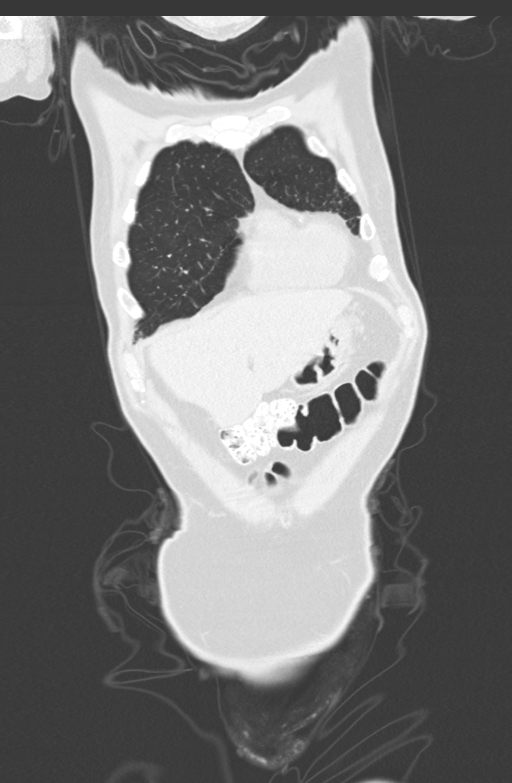
[im 63/157  lung]
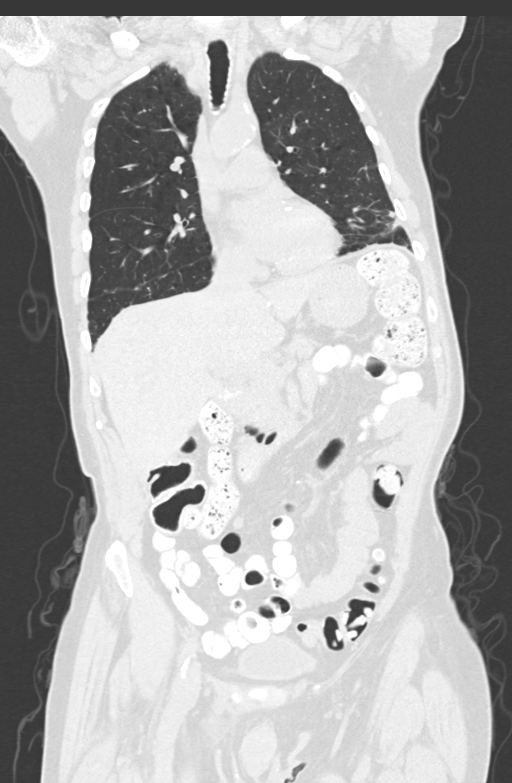
[im 94/157  lung]
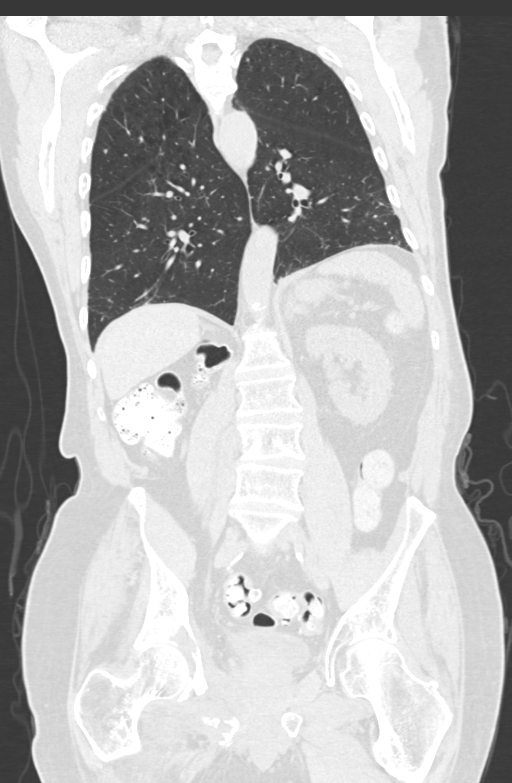

[12 of 36 positions shown; findings below may reference images not displayed]

FINDINGS: CT CHEST FINDINGS

Cardiovascular: Again demonstrated is diffuse atherosclerosis of the
aorta, great vessels and coronary arteries. There are probable
calcifications of the aortic valve. The heart size is normal. There
is no pericardial effusion.

Mediastinum/Nodes: There are no enlarged mediastinal, hilar or
axillary lymph nodes. Hilar assessment is limited by the lack of
intravenous contrast, although the hilar contours appear unchanged.
The thyroid gland, trachea and esophagus demonstrate no significant
findings.

Lungs/Pleura: No pleural effusion or pneumothorax. Moderate
centrilobular and paraseptal emphysema with stable right apical
scarring. There are innumerable new and enlarging small pulmonary
nodules bilaterally consistent with metastatic disease.
Representative nodules include a 7 mm left apical nodule on image
[DATE] and a 5 mm left upper lobe nodule on image [DATE].

Musculoskeletal/Chest wall: Stable expansile lytic lesion within the
lower sternum and mixed sclerotic and lucent lesion within the T3
vertebral body. The soft tissue mass associated with the left
scapular metastases appear smaller. No progressive osseous
metastatic disease identified in the chest.

CT ABDOMEN AND PELVIS FINDINGS

Hepatobiliary: No focal hepatic abnormalities are identified on
noncontrast imaging. No evidence of gallstones, gallbladder wall
thickening or biliary dilatation.

Pancreas: Unremarkable. No pancreatic ductal dilatation or
surrounding inflammatory changes.

Spleen: Normal in size without focal abnormality.

Adrenals/Urinary Tract: Both adrenal glands appear normal. The left
kidney appears unremarkable as imaged in the noncontrast state. No
evidence of local recurrence within the right nephrectomy bed. The
bladder appears unremarkable.

Stomach/Bowel: Enteric contrast was administered and has passed into
the distal colon. Gastric wall thickening appears similar to the
previous study. No evidence bowel wall thickening, distention or
surrounding inflammation. There are scattered diverticular changes
throughout the distal colon.

Vascular/Lymphatic: There are no enlarged abdominal or pelvic lymph
nodes. Aortic and branch vessel atherosclerosis. No acute vascular
findings identified on noncontrast imaging.

Reproductive: The prostate gland and seminal vesicles appear
unremarkable.

Other: No ascites or peritoneal nodularity.

Musculoskeletal: The lytic metastasis involving the right posterior
acetabulum (3.7 x 3.6 cm on image 106/2) and the right ischium and
inferior pubic ramus have not significantly changed. However, there
is an enlarging lytic metastasis involving the L3 vertebral body and
its posterior elements on the right, measuring up to 7.4 x 4.0 cm on
image 72/2. The soft tissue components of this mass extend into the
spinal canal and contribute to moderate right L3-4 neural foraminal
narrowing. No other progressive osseous metastases identified.
IMPRESSION: 1. Widespread small pulmonary metastases.
2. Enlarging lytic metastasis involving the L3 vertebral body and
its posterior elements on the right with associated mass effect on
the thecal sac and right L3-4 foramen. This mass could contribute to
nerve root encroachment. Consider further evaluation with lumbar
MRI.
3. The other known osseous metastases are grossly stable. No
evidence of local recurrence status post right nephrectomy.
4. Coronary and Aortic Atherosclerosis ([ZS]-[ZS]). Emphysema
([ZS]-[ZS]).
5. These results will be called to the ordering clinician or
representative by the Radiologist Assistant, and communication
documented in the PACS or [REDACTED].

## 2021-06-24 ENCOUNTER — Encounter: Payer: Self-pay | Admitting: Physical Therapy

## 2021-06-24 ENCOUNTER — Other Ambulatory Visit: Payer: Self-pay

## 2021-06-24 ENCOUNTER — Telehealth: Payer: Self-pay

## 2021-06-24 ENCOUNTER — Ambulatory Visit: Payer: Medicare Other | Admitting: Physical Therapy

## 2021-06-24 DIAGNOSIS — G8929 Other chronic pain: Secondary | ICD-10-CM

## 2021-06-24 DIAGNOSIS — R2689 Other abnormalities of gait and mobility: Secondary | ICD-10-CM

## 2021-06-24 DIAGNOSIS — M6281 Muscle weakness (generalized): Secondary | ICD-10-CM | POA: Diagnosis not present

## 2021-06-24 DIAGNOSIS — M25551 Pain in right hip: Secondary | ICD-10-CM

## 2021-06-24 DIAGNOSIS — R262 Difficulty in walking, not elsewhere classified: Secondary | ICD-10-CM

## 2021-06-24 DIAGNOSIS — R2681 Unsteadiness on feet: Secondary | ICD-10-CM

## 2021-06-24 DIAGNOSIS — M25651 Stiffness of right hip, not elsewhere classified: Secondary | ICD-10-CM

## 2021-06-24 NOTE — Progress Notes (Unsigned)
Radiology called report on this pt to make Dr Alen Blew aware of results. CT abd/pelvis: Radiologist wanted impression 1 and 3

## 2021-06-24 NOTE — Therapy (Signed)
[facility] [address]  [place], [place], [address] Phone: [phone]   Fax:  [phone]  Physical Therapy Treatment  Patient Details  Name: James Massey MRN: [id number] Date of Birth: 09-15-1945 Referring Provider (PT): Lawson Radar, PA-C   Encounter Date: 06/24/2021   PT End of Session - 06/24/21 1532     Visit Number 23    Number of Visits 30    Date for PT Re-Evaluation 07/08/21   POC extended to 7 weeks to accomdate for limited scheduling due to holidays   Authorization Type Medicare & BCBS    Progress Note Due on Visit 28   Recert on visit #27 - 51/70/01   PT Start Time 1532    PT Stop Time 1621    PT Time Calculation (min) 49 min    Activity Tolerance Patient tolerated treatment well;Patient limited by pain    Behavior During Therapy [facility] for tasks assessed/performed             Past Medical History:  Diagnosis Date   Anxiety    Barrett esophagus    GERD (gastroesophageal reflux disease)    Gout    Heart murmur    History of anemia    Kidney cancer, primary, 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 to other site [facility])    Mixed hyperlipidemia    Renal cell cancer, right ([place])    Right renal mass    Wears glasses     Past Surgical History:  Procedure Laterality Date   CYSTOSCOPY WITH RETROGRADE PYELOGRAM, URETEROSCOPY AND STENT PLACEMENT Right 05/21/2018   Procedure: RIGHT RETROGRADE PYELOGRAM, RIGHT DIAGNOSTIC URETEROSCOPY AND STENT PLACEMENT;  Surgeon: Ardis Hughs, MD;  Location: [facility];  Service: Urology;  Laterality: Right;   INGUINAL HERNIA REPAIR Bilateral 09/2012   LAPAROSCOPIC NEPHRECTOMY, HAND ASSISTED Right 05/31/2018   Procedure: LAPAROSCOPIC RADICAL RIGHT NEPHRECTOMY;  Surgeon: Ardis Hughs, MD;  Location: [facility];  Service: Urology;  Laterality: Right;    There were no vitals filed for this visit.   Subjective Assessment - 06/24/21 1532     Subjective Pt reports  increased pain in R toes, ankle, knee and hip today as well as increased numbness in R toes.    Patient is accompained by: --   [place]   Patient Stated Goals "ideally to be able to walk w/o having to use the walker"    Currently in Pain? Yes    Pain Score 8     Pain Location Hip    Pain Orientation Right    Pain Descriptors / Indicators Other (Comment)   "solid"   Pain Type Chronic pain    Pain Score 7    Pain Location Knee    Pain Orientation Right    Pain Descriptors / Indicators Sharp    Pain Type Chronic pain    Pain Score 7    Pain Location Ankle    Pain Orientation Right    Pain Descriptors / Indicators Tightness    Pain Type Chronic pain                               [facility] Adult PT Treatment/Exercise - 06/24/21 1532       Knee/Hip Exercises: Stretches   Passive Hamstring Stretch Right;2 reps;30 seconds    Passive Hamstring Stretch Limitations manual by PT    Hip Flexor Stretch Right   ~  6-7 minutes   Hip Flexor Stretch Limitations LLLD stretch in mod thomas position with PT applying gentle overpressure and gradually moving hip into increased extension while performing STM & TPR to R hips flexors & TFL    Piriformis Stretch Right;1 rep;30 seconds    Piriformis Stretch Limitations manual by PT      Knee/Hip Exercises: Aerobic   Nustep L6 x 6 min (UE/LE)      Manual Therapy   Manual Therapy Soft tissue mobilization;Myofascial release;Joint mobilization;Passive ROM    Joint Mobilization R patellar mobs - all directions    Soft tissue mobilization STM/DTM to R TFL, distal hip flexors, quads & HS; retrograde massage to R foot  and lower leg in supine with foot elevated on wedge bolster - emphasis on dorsum of foot and anterior shin over anterior tibialis to reduce muscle inhibition from edema    Myofascial Release manual TPR to R TFL and iliopsoas    Passive ROM gentle PROM/stretching for all R hip ROM - limited available ROM                        PT Short Term Goals - 04/09/21 1500       PT SHORT TERM GOAL #1   Title Patient will be independent with initial HEP    Status Achieved   02/25/21     PT SHORT TERM GOAL #2   Title Patient will demonstrate safe transfer technique and proper gait pattern with 4-wheel rolling walker or LRAD as indicated    Status Achieved   04/09/21   Target Date --      PT SHORT TERM GOAL #3   Title Patient will increase gait speed to >/= 2.0 ft/sec with RW or LRAD to decrease risk for recurrent falls    Baseline 1.64 ft/sec with RW    Status Achieved   04/09/21 - gait speed 2.29 ft/sec with rollator   Target Date --      PT SHORT TERM GOAL #4   Title Determine balance goals based on results of standardized balance testing    Status Achieved   02/11/21              PT Long Term Goals - 06/11/21 1547       PT LONG TERM GOAL #1   Title Patient will be independent with ongoing/advanced HEP for self-management at home in order to build upon functional gains in therapy    Status Partially Met    Target Date 07/08/21      PT LONG TERM GOAL #2   Title Decrease R hip and knee pain by >/= 50% during transitional movements allowing patient increased ease of mobiliy and transfers    Status Partially Met   06/11/21 - pt reporting 30-40% improvement in pain   Target Date 07/08/21      PT LONG TERM GOAL #3   Title Patient will demonstrate improved B LE strength to >/= 4+/5 for improved stability and ease of mobility    Status Partially Met    Target Date 07/08/21      PT LONG TERM GOAL #4   Title Patient will increase gait speed to >/= 2.62 ft/sec with rollator, SPC or LRAD to increase safety with community ambulation    Baseline 1.64 ft/sec with RW    Status Partially Met   03/26/21 - 1.96 ft/sec with rollator; 04/09/21 - 2.29 ft/sec with rollator; 05/09/21 - 2.23 ft/sec with rollator; 05/20/21 -  2.00 ft/sec with rollator   Target Date 07/08/21      PT LONG TERM GOAL #5   Title  Patient will demonstrate decreased TUG time to </= 18 sec with SPC or LRAD to decrease risk for falls with transitional mobility    Baseline 26.5 sec with RW    Status Partially Met   03/26/21 - TUG = 22.34 sec with rollator; 05/09/21 - 23.59 sec with rollator; 05/20/21 - 25.97 sec with rollator   Target Date 07/08/21      PT LONG TERM GOAL #6   Title Patient will improve Berg score to >/= 40/56 to improve safety stability with ADLs in standing and reduce risk for falls    Baseline 26/56    Status Partially Met   03/26/21 Merrilee Jansky = 36/56; 05/20/21 - 34/56   Target Date 07/08/21                   Plan - 06/24/21 1621     Clinical Impression Statement Richard arrived to PT today with c/o worsening pain t/o R LE as well as increased numbness in R lower leg and ankle. Gait revealing decreased weight shift to R, decreased stride length with limited DF and poor foot clearance. Session focusing on pain management with STM/DTM, MFR and retrograde massage followed by manual stretching and gentle ROM. Pt reporting 30-40% reduction in pain with improved ambulation tolerance by end of session but still having difficulty with active DF for foot clearance on R. Richard and his wife expressing desire to extend PT into new year, therefore will plan for reassessment to determine ongoing skilled needs over next visit(s).    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); R nephrectomy 2 cancer; OA; gout; GERD    Rehab Potential Good    PT Frequency 2x / week    PT Duration 6 weeks   6-7 weeks   PT Treatment/Interventions ADLs/Self Care Home Management;Cryotherapy;Moist Heat;DME Instruction;Gait training;Stair training;Functional mobility training;Therapeutic activities;Therapeutic exercise;Balance training;Neuromuscular re-education;Patient/family education;Manual techniques;Passive range of motion;Dry needling;Taping;Vasopneumatic Device;Electrical  Stimulation;Iontophoresis 38m/ml Dexamethasone    PT Next Visit Plan pain management as able for R hip pain PRN - manual therapy and modalities including estim or ionto if benefit noted; core/lumboplevic and LE flexibility and strengthening - HEP updates as indicated; balance training; gait training to normalize gait pattern and improve stability    PT Home Exercise Plan Access Code: [email or web address] (8/4, updated 8/8, 9/28, 10/4, 11/23 & 12/1); corner balance progression (12/1)    Consulted and Agree with Plan of Care Patient;Other (Comment)    Family Member Consulted CNA - EBritton            Patient will benefit from skilled therapeutic intervention in order to improve the following deficits and impairments:  Abnormal gait, Decreased activity tolerance, Decreased balance, Decreased endurance, Decreased knowledge of precautions, Decreased knowledge of use of DME, Decreased mobility, Decreased range of motion, Decreased safety awareness, Decreased strength, Difficulty walking, Increased fascial restricitons, Increased muscle spasms, Impaired perceived functional ability, Impaired flexibility, Improper body mechanics, Postural dysfunction, Pain  Visit Diagnosis: Muscle weakness (generalized)  Unsteadiness on feet  Other abnormalities of gait and mobility  Difficulty in walking, not elsewhere classified  Chronic pain of right knee  Pain in right hip  Stiffness of right hip, not elsewhere classified     Problem List Patient Active Problem List   Diagnosis Date Noted   B12 deficiency 01/16/2021   Depression 01/16/2021  Hypoalbuminemia 01/16/2021   Generalized weakness 01/16/2021   Hypothyroidism (acquired) 01/13/2021   Altered mental status 79/00/9200   Acute metabolic encephalopathy 41/59/3012   Hypokalemia 01/09/2021   Hypomagnesemia 01/09/2021   Hypocalcemia 01/09/2021   Primary malignant neoplasm of kidney 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 to other site [facility]) 09/05/2020   Goals of  care, counseling/discussion 09/05/2020   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 ([place]) 01/31/2020   History of right nephrectomy 08/13/2018   Renal mass, left 05/31/2018   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   Barrett's esophagus 08/30/2015   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    Percival Spanish, PT 06/24/2021, 5:56 PM  [facility] [address]  [place], [place], [address] Phone: [phone]   Fax:  [phone]  Name: Clearance Chenault MRN: [id number] Date of Birth: [birth year]

## 2021-06-25 ENCOUNTER — Inpatient Hospital Stay (HOSPITAL_BASED_OUTPATIENT_CLINIC_OR_DEPARTMENT_OTHER): Payer: Medicare Other | Admitting: Oncology

## 2021-06-25 ENCOUNTER — Inpatient Hospital Stay: Payer: Medicare Other | Attending: Oncology

## 2021-06-25 VITALS — BP 118/62 | HR 85 | Temp 98.1°F | Resp 17 | Ht 68.0 in | Wt 180.4 lb

## 2021-06-25 DIAGNOSIS — C649 Malignant neoplasm of unspecified kidney, except renal pelvis: Secondary | ICD-10-CM

## 2021-06-25 DIAGNOSIS — Z79899 Other long term (current) drug therapy: Secondary | ICD-10-CM | POA: Insufficient documentation

## 2021-06-25 DIAGNOSIS — E039 Hypothyroidism, unspecified: Secondary | ICD-10-CM

## 2021-06-25 DIAGNOSIS — R600 Localized edema: Secondary | ICD-10-CM | POA: Insufficient documentation

## 2021-06-25 DIAGNOSIS — C641 Malignant neoplasm of right kidney, except renal pelvis: Secondary | ICD-10-CM

## 2021-06-25 DIAGNOSIS — C7951 Secondary malignant neoplasm of bone: Secondary | ICD-10-CM | POA: Insufficient documentation

## 2021-06-25 DIAGNOSIS — C801 Malignant (primary) neoplasm, unspecified: Secondary | ICD-10-CM

## 2021-06-25 DIAGNOSIS — C7800 Secondary malignant neoplasm of unspecified lung: Secondary | ICD-10-CM | POA: Insufficient documentation

## 2021-06-25 LAB — CBC WITH DIFFERENTIAL (CANCER CENTER ONLY)
Abs Immature Granulocytes: 0.06 10*3/uL (ref 0.00–0.07)
Basophils Absolute: 0.1 10*3/uL (ref 0.0–0.1)
Basophils Relative: 1 %
Eosinophils Absolute: 0.2 10*3/uL (ref 0.0–0.5)
Eosinophils Relative: 2 %
HCT: 34.6 % — ABNORMAL LOW (ref 39.0–52.0)
Hemoglobin: 11.7 g/dL — ABNORMAL LOW (ref 13.0–17.0)
Immature Granulocytes: 1 %
Lymphocytes Relative: 9 %
Lymphs Abs: 0.8 10*3/uL (ref 0.7–4.0)
MCH: 31.7 pg (ref 26.0–34.0)
MCHC: 33.8 g/dL (ref 30.0–36.0)
MCV: 93.8 fL (ref 80.0–100.0)
Monocytes Absolute: 0.8 10*3/uL (ref 0.1–1.0)
Monocytes Relative: 8 %
Neutro Abs: 7.8 10*3/uL — ABNORMAL HIGH (ref 1.7–7.7)
Neutrophils Relative %: 79 %
Platelet Count: 221 10*3/uL (ref 150–400)
RBC: 3.69 MIL/uL — ABNORMAL LOW (ref 4.22–5.81)
RDW: 13.6 % (ref 11.5–15.5)
WBC Count: 9.7 10*3/uL (ref 4.0–10.5)
nRBC: 0 % (ref 0.0–0.2)

## 2021-06-25 LAB — CMP (CANCER CENTER ONLY)
ALT: 23 U/L (ref 0–44)
AST: 21 U/L (ref 15–41)
Albumin: 3.4 g/dL — ABNORMAL LOW (ref 3.5–5.0)
Alkaline Phosphatase: 74 U/L (ref 38–126)
Anion gap: 6 (ref 5–15)
BUN: 22 mg/dL (ref 8–23)
CO2: 27 mmol/L (ref 22–32)
Calcium: 10 mg/dL (ref 8.9–10.3)
Chloride: 103 mmol/L (ref 98–111)
Creatinine: 1.72 mg/dL — ABNORMAL HIGH (ref 0.61–1.24)
GFR, Estimated: 41 mL/min — ABNORMAL LOW (ref >60)
Glucose, Bld: 110 mg/dL — ABNORMAL HIGH (ref 70–99)
Potassium: 4.2 mmol/L (ref 3.5–5.1)
Sodium: 136 mmol/L (ref 135–145)
Total Bilirubin: 0.4 mg/dL (ref 0.3–1.2)
Total Protein: 6.3 g/dL — ABNORMAL LOW (ref 6.5–8.1)

## 2021-06-25 NOTE — Progress Notes (Signed)
Hematology and Oncology Follow Up Visit  James Massey [id number] Nov 18, 1945 75 y.o. 06/25/2021 3:15 PM [name], MDShadad, [name], MD   Principle Diagnosis: 44 year old man with kidney cancer diagnosed in 2019.  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  Prior Therapy:  He is status post laparoscopic right radical nephrectomy on May 31, 2018.  He is status post right hip and left lung radiation therapy completed in August 2021.  He received 50 Gray in 5 fractions  He is status post radiation to the right posterior acetabular lesion completed in November 2021.  He received at 50 Gray in 5 fractions.  He status post radiation therapy to the right and the left proximal femur for a total of 30 Gray in 10 fractions completed in February 2022.  Current therapy: Cabometyx 40 mg daily started on 05/28/2020.  Nivolumab added on September 20, 2020.  Treatments are currently on hold.   Interim History: James Massey returns today for a follow-up visit.  Since the last visit, he reports no major changes in his health.  He continues to participate in physical therapy and he notices improvement in his quality of life and performance status.  He is ambulating with the help of walker without any falls or syncope.  He denies any back pain or increasing hip pain.  Medications: Reviewed without changes. Current Outpatient Medications  Medication Sig Dispense Refill   allopurinol (ZYLOPRIM) 300 MG tablet Take 300 mg by mouth at bedtime.      atorvastatin (LIPITOR) 40 MG tablet Take 40 mg by mouth at bedtime.      calcium-vitamin D (OSCAL WITH D) 500-200 MG-UNIT tablet Take 2 tablets by mouth 2 (two) times daily. 60 tablet 0   diazepam (VALIUM) 5 MG/ML solution Take 5 mg by mouth every 8 (eight) hours as needed for anxiety.     furosemide (LASIX) 20 MG tablet TAKE 1 TABLET(20 MG) BY MOUTH DAILY FOR 14 DAYS 14 tablet 1   hydrocortisone 2.5 %  cream Apply 1 application topically 2 (two) times daily as needed (for hemorroidal flare ups).     levothyroxine (SYNTHROID) 50 MCG tablet Take 1 tablet (50 mcg total) by mouth daily before breakfast. 30 tablet 2   magnesium oxide (MAG-OX) 400 (240 Mg) MG tablet Take 1 tablet (400 mg total) by mouth 2 (two) times daily. (Patient not taking: Reported on 02/05/2021) 60 tablet 2   Multiple Vitamin (MULTIVITAMIN) tablet Take 2 tablets by mouth daily.     pantoprazole (PROTONIX) 40 MG tablet Take 40 mg by mouth 2 (two) times daily.     prednisoLONE acetate (PRED FORTE) 1 % ophthalmic suspension Place 1 drop into the left eye 4 (four) times daily.     Simethicone 180 MG CAPS Take 180 mg by mouth 3 (three) times daily as needed (for gas/indigestion.).     traMADol (ULTRAM) 50 MG tablet TAKE 1 TABLET(50 MG) BY MOUTH EVERY 6 HOURS AS NEEDED 60 tablet 1   No current facility-administered medications for this visit.     Allergies:  Allergies  Allergen Reactions   Bee Venom Swelling   Budesonide-Formoterol Fumarate Other (See Comments)    RESPIRATORY ISSUES   Colchicine Nausea And Vomiting   Poison [employer] and ivy   Clindamycin/Lincomycin Rash     Physical Exam:       Blood pressure 118/62, pulse 85, temperature 98.1 F (36.7 C),  temperature source Temporal, resp. rate 17, height 5\' 8"  (1.727 m), weight 180 lb 6.4 oz (81.8 kg), SpO2 98 %.       ECOG: 1      General appearance: Alert, awake without any distress. Head: Atraumatic without abnormalities Oropharynx: Without any thrush or ulcers. Eyes: No scleral icterus. Lymph nodes: No lymphadenopathy noted in the cervical, supraclavicular, or axillary nodes Heart:regular rate and rhythm, without any murmurs or gallops.   Lung: Clear to auscultation without any rhonchi, wheezes or dullness to percussion. Abdomin: Soft, nontender without any shifting dullness or ascites. Musculoskeletal: No clubbing or  cyanosis. Neurological: No motor or sensory deficits. Skin: No rashes or lesions.                Lab Results: Lab Results  Component Value Date   WBC 8.4 05/03/2021   HGB 12.1 (L) 05/03/2021   HCT 36.2 (L) 05/03/2021   MCV 93.5 05/03/2021   PLT 242 05/03/2021     Chemistry      Component Value Date/Time   NA 138 05/03/2021 1001   K 4.0 05/03/2021 1001   CL 104 05/03/2021 1001   CO2 24 05/03/2021 1001   BUN 18 05/03/2021 1001   CREATININE 1.40 (H) 05/03/2021 1001      Component Value Date/Time   CALCIUM 9.5 05/03/2021 1001   ALKPHOS 74 05/03/2021 1001   AST 19 05/03/2021 1001   ALT 17 05/03/2021 1001   BILITOT 0.7 05/03/2021 1001        IMPRESSION: 1. Widespread small pulmonary metastases. 2. Enlarging lytic metastasis involving the L3 vertebral body and its posterior elements on the right with associated mass effect on the thecal sac and right L3-4 foramen. This mass could contribute to nerve root encroachment. Consider further evaluation with lumbar MRI. 3. The other known osseous metastases are grossly stable. No evidence of local recurrence status post right nephrectomy. 4. Coronary and Aortic Atherosclerosis (ICD10-I70.0). Emphysema (ICD10-J43.9). 5. These results will be called to the ordering clinician or representative by the Radiologist Assistant, and communication documented in the PACS or [employer].    Impression and Plan:   75 year old man with:   1.  Kidney cancer diagnosed in 2019.  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   CT scan obtained on 06/22/2021 was personally reviewed and discussed today.  He has disease progression with pulmonary metastasis as well as L3 vertebral body associated with possible thecal sac invasion.  Treatment choices moving forward were discussed at this time.  Restarting Cabometyx at a lower dose versus switching to a different agent  versus continued observation were reviewed.  After discussion today, we opted to defer the systemic treatment option until radiation is completed and we will reassess him at that time.  2.  L3 spinal metastasis: We will refer to radiation oncology for palliative radiation treatment.  He is asymptomatic but could develop symptoms in the future.     3.  Prognosis and goals of care: His disease is incurable although aggressive measures are warranted given his reasonable performance status.     4.  Hypothyroidism: TSH is back to normal range at this time.  5.  Lower extremity edema: Continues to improve with physical therapy and Lasix.  6.  Follow up: In 6 weeks for repeat follow-up.     30  minutes were dedicated to this visit.  The time was spent on reviewing laboratory data, disease status update  and outlining future plan of care discussion.  [name], MD 12/20/20223:15 PM

## 2021-06-26 LAB — TSH: TSH: 3.719 u[IU]/mL (ref 0.320–4.118)

## 2021-06-27 ENCOUNTER — Encounter: Payer: Self-pay | Admitting: Oncology

## 2021-06-27 NOTE — Telephone Encounter (Signed)
err

## 2021-07-02 ENCOUNTER — Ambulatory Visit: Payer: Medicare Other | Admitting: Physical Therapy

## 2021-07-02 ENCOUNTER — Telehealth: Payer: Self-pay

## 2021-07-02 NOTE — Telephone Encounter (Signed)
Patient's spouse Mrs. James Massey called wanting to get radiation treatments started as soon as possible for patient [name]. She would like to discuss his treatment plan with Ashlyn Bruning PA-C on tomorrow morning 07/03/21 if time permits. I told her that we would do our best at getting back to her in a timely manner. Mrs. James Massey verbalized understanding.  Patient contact [phone]

## 2021-07-03 ENCOUNTER — Encounter: Payer: Self-pay | Admitting: Urology

## 2021-07-03 NOTE — Progress Notes (Addendum)
Spoke w/ spouse Rafe Mackowski who is cleared to speak on patient's behalf and identified using 2 identifiers. She reports patient is having RT hip/knee pain 6/10 and overall joint aches/ pains. No other symptoms reported at this time.  Meaningful use complete.  There were no vitals taken for this visit.

## 2021-07-04 ENCOUNTER — Other Ambulatory Visit: Payer: Self-pay

## 2021-07-04 ENCOUNTER — Telehealth: Payer: Self-pay | Admitting: Oncology

## 2021-07-04 ENCOUNTER — Ambulatory Visit: Payer: Medicare Other | Admitting: Physical Therapy

## 2021-07-04 ENCOUNTER — Encounter: Payer: Self-pay | Admitting: Physical Therapy

## 2021-07-04 ENCOUNTER — Ambulatory Visit
Admission: RE | Admit: 2021-07-04 | Discharge: 2021-07-04 | Disposition: A | Discharge status: Home / Self Care | Payer: Medicare Other | Source: Ambulatory Visit | Attending: Urology | Admitting: Urology

## 2021-07-04 VITALS — Ht 68.0 in | Wt 180.0 lb

## 2021-07-04 DIAGNOSIS — C7951 Secondary malignant neoplasm of bone: Secondary | ICD-10-CM | POA: Diagnosis present

## 2021-07-04 DIAGNOSIS — J432 Centrilobular emphysema: Secondary | ICD-10-CM | POA: Diagnosis not present

## 2021-07-04 DIAGNOSIS — R262 Difficulty in walking, not elsewhere classified: Secondary | ICD-10-CM

## 2021-07-04 DIAGNOSIS — Z803 Family history of malignant neoplasm of breast: Secondary | ICD-10-CM | POA: Diagnosis not present

## 2021-07-04 DIAGNOSIS — Z79899 Other long term (current) drug therapy: Secondary | ICD-10-CM | POA: Diagnosis not present

## 2021-07-04 DIAGNOSIS — F419 Anxiety disorder, unspecified: Secondary | ICD-10-CM | POA: Insufficient documentation

## 2021-07-04 DIAGNOSIS — R2681 Unsteadiness on feet: Secondary | ICD-10-CM

## 2021-07-04 DIAGNOSIS — E785 Hyperlipidemia, unspecified: Secondary | ICD-10-CM | POA: Diagnosis not present

## 2021-07-04 DIAGNOSIS — C641 Malignant neoplasm of right kidney, except renal pelvis: Secondary | ICD-10-CM | POA: Insufficient documentation

## 2021-07-04 DIAGNOSIS — G8929 Other chronic pain: Secondary | ICD-10-CM

## 2021-07-04 DIAGNOSIS — C649 Malignant neoplasm of unspecified kidney, except renal pelvis: Secondary | ICD-10-CM

## 2021-07-04 DIAGNOSIS — K2270 Barrett's esophagus without dysplasia: Secondary | ICD-10-CM | POA: Diagnosis not present

## 2021-07-04 DIAGNOSIS — K219 Gastro-esophageal reflux disease without esophagitis: Secondary | ICD-10-CM | POA: Insufficient documentation

## 2021-07-04 DIAGNOSIS — M6281 Muscle weakness (generalized): Secondary | ICD-10-CM

## 2021-07-04 DIAGNOSIS — R2689 Other abnormalities of gait and mobility: Secondary | ICD-10-CM

## 2021-07-04 DIAGNOSIS — M25551 Pain in right hip: Secondary | ICD-10-CM

## 2021-07-04 NOTE — Addendum Note (Signed)
Encounter addended by: Freeman Caldron, PA-C on: 07/04/2021 3:03 PM  Actions taken: Level of Service modified, Clinical Note Signed

## 2021-07-04 NOTE — Progress Notes (Signed)
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    ICD-10-CM   1. 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 male was initially seen on 01/31/20 at the request of Dr. Alen Blew. He was initially diagnosed with renal cell carcinoma in 2019 after presenting with gross hematuria. He was treated with right radical nephrectomy on 05/31/2018 under the care of Dr. [name]. Final surgical pathology showed a stage T3a clear cell renal carcinoma with tumor invasion into the renal sinus fatty tissue, grade 3 with negative surgical margins and had remained in observation since that time with restaging scans showing no evidence of disease recurrence or metastasis.  He underwent surveillance CT C/A/P on 12/27/2019 showing a new 6.8 cm large expansile mass in the right inferior pubic ramus with a smaller lytic lesion in the right acetabular roof as well as 10 mm nodule in the LLL, concerning for metastases and additional tiny peripheral subpleural pulmonary nodules in the posterior RUL and RLL.  CT-guided core biopsy of the pubic ramus mass was performed on 01/19/2020 with final surgical pathology confirming metastatic clear cell renal cell carcinoma, grade II.  He met in consultation with Dr. Alen Blew on 01/18/2020 and his case was also presented at the multidisciplinary urologic oncology conference where consensus was to proceed with stereotactic radiation to the oligometastatic disease in the right  hip and left lower lobe lung. We met with the patient on 01/31/2020 to discus SBRT. The patient and Dr. Alen Blew decided to postpone starting any systemic therapy and re-evaluate after his next set of restaging scans. He subsequently completed 5 fractions of SBRT to the metastatic disease in the right posterior pubic ramus and left lung nodule-completed on 03/01/20.  Restaging CT C/A/P on 04/11/2020 showed slightly progressive pulmonary metastases with a dominant LLL nodule minimally enlarged as well as indeterminate new tiny puilmonary nodules bilaterally. There was also evidence of progressive osseous metastases with a new right posterior acetabular lesion but the recently treated right pubic ramus lytic lesion appeared similar. There was no thoracic adenopathy or evidence of soft tissue metastasis within the abdomen or pelvis.  He was referred to Dr. Redmond Pulling in orthopedic oncology to discuss possible surgical treatment of the new right acetabular lesion which appeared threatening for future fracture. Dr. Redmond Pulling reached out to Dr. Tammi Klippel on 04/24/2020 and discussion was documented indicating that the new right posterior acetabular metastasis is mechanically stable and outside of his previous right ischial SBRT targeted area.  Dr. Redmond Pulling and Tammi Klippel agreed that SBRT would be a good option for management of the acetabular lesion, reserving surgical therapy for salvage if needed in the future.  The patient was in agreement to proceed with SBRT and this was completed on 05/18/2020 and tolerated very well.  He was started on single agent Cabometyx on 05/28/2020 which he tolerated well.    On repeat imaging with Dr. Redmond Pulling at  Hospital in 08/2020, he was noted to have an enlarging,  destructive lytic lesion in the proximal third of the right femoral diaphysis measuring approximately 6.3 cm and threatening for pathologic fracture.  There were also multiple lytic lesions within the proximal left femoral diaphysis measuring  2.3 cm without evidence of pathologic fracture.  He was referred back to [place] for further discussion of palliative radiation to the new lesions in the proximal femurs in an attempt to prevent progression and potential pathologic fracture and completed a 2-week course of palliative radiation to bilateral proximal femurs on 08/29/2020.  Nivolumab immunotherapy was added to the Kabbe matrix systemic therapy in March 2022.  He felt like he was tolerating the treatment fairly well but ended up in the hospital in July 2022 due to some changes in mental status and his systemic treatment was placed on hold at that time.  He is continued in observation only since July 2022 with repeat imaging in September indicating relatively overall stable disease but unfortunately, his most recent restaging CT C/A/P on 06/22/2021 showed disease progression in the lungs as well as an enlarging lytic metastasis at L3, measuring up to 7.4 cm with the soft tissue components extending into the spinal canal with associated mass-effect on the thecal sac and moderate right L3-4 neural foraminal narrowing.  No evidence of other progressive osseous metastases.  Fortunately, he is not having any significant low back pain and denies any neurologic symptoms such as focal weakness in the lower extremities, changes in bowel or bladder function, or paresthesias in the lower extremities.  He reports occasional pains in the center of his lower back which has been ongoing for quite some time and not progressively worsening.  He has been referred back to [place] today to discuss the potential role of palliative radiotherapy to the enlarging lesion at L3 to prevent progression and preserve neurologic function.   PREVIOUS RADIATION THERAPY: Yes 08/16/20 - 08/29/20:   1.  The left proximal femur was treated to 30 Gy in 10 fractions of 3 Gy 2.  The right proximal femur was treated to 30 Gy in 10 fractions of 3 Gy  05/08/20 - 05/18/20:  The target in the right  acetabulum was treated to 50 Gy in 5 fractions of 10 Gy  02/20/20 - 03/01/20: Right posterior inferior pubic ramus  + LLL Lung nodule / 50 Gy in 5 fractions (SBRT)  PAST MEDICAL HISTORY:  Past Medical History:  Diagnosis Date   Anxiety    Barrett esophagus    GERD (gastroesophageal reflux disease)    Gout    Heart murmur    History of anemia    Kidney cancer, primary, 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 to other site [facility])    Mixed hyperlipidemia    Renal cell cancer, right ([place])    Right renal mass    Wears glasses       PAST SURGICAL HISTORY: Past Surgical History:  Procedure Laterality Date   CYSTOSCOPY WITH RETROGRADE PYELOGRAM, URETEROSCOPY AND STENT PLACEMENT Right 05/21/2018   Procedure: RIGHT RETROGRADE PYELOGRAM, RIGHT DIAGNOSTIC URETEROSCOPY AND STENT PLACEMENT;  Surgeon: Ardis Hughs, MD;  Location: [facility];  Service: Urology;  Laterality: Right;   INGUINAL HERNIA REPAIR Bilateral 09/2012   LAPAROSCOPIC NEPHRECTOMY, HAND ASSISTED Right 05/31/2018   Procedure: LAPAROSCOPIC RADICAL RIGHT NEPHRECTOMY;  Surgeon: Ardis Hughs, MD;  Location: [facility];  Service: Urology;  Laterality: Right;    FAMILY HISTORY:  Family History  Problem Relation Age of Onset   Breast cancer Mother    Heart attack Father  Prostate cancer Neg Hx    Pancreatic cancer Neg Hx    Colon cancer Neg Hx     SOCIAL HISTORY:  Social History   Socioeconomic History   Marital status: Married    Spouse name: Not on file   Number of children: 2   Years of education: 16   Highest education level: Bachelor's degree (e.g., BA, AB, BS)  Occupational History   Occupation: retired Biochemist, clinical  Tobacco Use   Smoking status: Former    Packs/day: 0.50    Years: 15.00    Pack years: 7.50    Types: Cigarettes    Quit date: 12/12/1987    Years since quitting: 33.5   Smokeless tobacco: Never  Vaping Use   Vaping Use: Never used  Substance and Sexual Activity   Alcohol use: Yes    Comment:  occasional beer   Drug use: Never   Sexual activity: Not Currently  Other Topics Concern   Not on file  Social History Narrative   Lives with wife in [place].  Has two grown natural children. Currently has active cancer in several locations   In body, but has not recently spread to new areas according to patient. Retired from [employer].   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   Social Determinants of Radio broadcast assistant Strain: Not on file  Food Insecurity: Not on file  Transportation Needs: Not on file  Physical Activity: Not on file  Stress: Not on file  Social Connections: Not on file  Intimate Partner Violence: Not on file    ALLERGIES: Bee venom, Budesonide-formoterol fumarate, Colchicine, Poison oak extract, and Clindamycin/lincomycin  MEDICATIONS:  Current Outpatient Medications  Medication Sig Dispense Refill   allopurinol (ZYLOPRIM) 300 MG tablet Take 300 mg by mouth at bedtime.      atorvastatin (LIPITOR) 40 MG tablet Take 40 mg by mouth at bedtime.      calcium-vitamin D (OSCAL WITH D) 500-200 MG-UNIT tablet Take 2 tablets by mouth 2 (two) times daily. 60 tablet 0   diazepam (VALIUM) 5 MG/ML solution Take 5 mg by mouth every 8 (eight) hours as needed for anxiety.     furosemide (LASIX) 20 MG tablet TAKE 1 TABLET(20 MG) BY MOUTH DAILY FOR 14 DAYS 14 tablet 1   hydrocortisone 2.5 % cream Apply 1 application topically 2 (two) times daily as needed (for hemorroidal flare ups).     levothyroxine (SYNTHROID) 50 MCG tablet Take 1 tablet (50 mcg total) by mouth daily before breakfast. 30 tablet 2   magnesium oxide (MAG-OX) 400 (240 Mg) MG tablet Take 1 tablet (400 mg total) by mouth 2 (two) times daily. (Patient not taking: Reported on 02/05/2021) 60 tablet 2   Multiple Vitamin (MULTIVITAMIN) tablet Take 2 tablets by mouth daily.     pantoprazole (PROTONIX) 40 MG tablet Take 40  mg by mouth 2 (two) times daily.     prednisoLONE acetate (PRED FORTE) 1 % ophthalmic suspension Place 1 drop into the left eye 4 (four) times daily.     Simethicone 180 MG CAPS Take 180 mg by mouth 3 (three) times daily as needed (for gas/indigestion.).     traMADol (ULTRAM) 50 MG tablet TAKE 1 TABLET(50 MG) BY MOUTH EVERY 6 HOURS AS NEEDED 60 tablet 1   No current facility-administered medications for this encounter.    REVIEW OF SYSTEMS:  On review  of systems, the patient reports that he is doing well overall. He denies any chest pain, shortness of breath, productive cough, hemoptysis, fevers, chills, night sweats, or recent unintended weight changes. He denies any bladder disturbances, and denies abdominal pain, nausea or vomiting. He denies any significant low back pain and denies any neurologic symptoms such as focal weakness in the lower extremities, changes in bowel or bladder function, or paresthesias in the lower extremities.  He reports occasional pains in the center of his lower back which has been ongoing for quite some time and not progressively worsening. He endorses using a walker, as directed by orthopedics for support and safety. He also reports intermittent sternal and left 2nd-3rd rib pain that is less overall but intense when he sneezes, and relieved with a hot pack.  A complete review of systems is obtained and is otherwise negative.   PHYSICAL EXAM:  Wt Readings from Last 3 Encounters:  07/03/21 180 lb (81.6 kg)  06/25/21 180 lb 6.4 oz (81.8 kg)  05/03/21 183 lb (83 kg)   Temp Readings from Last 3 Encounters:  06/25/21 98.1 F (36.7 C) (Temporal)  03/22/21 98.3 F (36.8 C) (Oral)  02/20/21 98.3 F (36.8 C) (Oral)   BP Readings from Last 3 Encounters:  06/25/21 118/62  05/03/21 122/70  03/22/21 (!) 114/56   Pulse Readings from Last 3 Encounters:  06/25/21 85  05/03/21 94  03/22/21 100   Pain Assessment Pain Score: 6  (RT hip/knee)/10  Unable to assess due to  the patient having a nonfunctional computer camera.   KPS = 100  100 - Normal; no complaints; no evidence of disease. 90   - Able to carry on normal activity; minor signs or symptoms of disease. 80   - Normal activity with effort; some signs or symptoms of disease. 74   - Cares for self; unable to carry on normal activity or to do active work. 60   - Requires occasional assistance, but is able to care for most of his personal needs. 50   - Requires considerable assistance and frequent medical care. 63   - Disabled; requires special care and assistance. 61   - Severely disabled; hospital admission is indicated although death not imminent. 64   - Very sick; hospital admission necessary; active supportive treatment necessary. 10   - Moribund; fatal processes progressing rapidly. 0     - Dead  Karnofsky DA, Abelmann [place], Craver LS and Burchenal JH [phone]) The use of the nitrogen mustards in the palliative treatment of carcinoma: with particular reference to bronchogenic carcinoma Cancer 1 634-56  LABORATORY DATA:  Lab Results  Component Value Date   WBC 9.7 06/25/2021   HGB 11.7 (L) 06/25/2021   HCT 34.6 (L) 06/25/2021   MCV 93.8 06/25/2021   PLT 221 06/25/2021   Lab Results  Component Value Date   NA 136 06/25/2021   K 4.2 06/25/2021   CL 103 06/25/2021   CO2 27 06/25/2021   Lab Results  Component Value Date   ALT 23 06/25/2021   AST 21 06/25/2021   ALKPHOS 74 06/25/2021   BILITOT 0.4 06/25/2021     RADIOGRAPHY: CT ABDOMEN PELVIS WO CONTRAST  Result Date: 06/24/2021 CLINICAL DATA:  Metastatic renal cell carcinoma. EXAM: CT CHEST, ABDOMEN AND PELVIS WITHOUT CONTRAST TECHNIQUE: Multidetector CT imaging of the chest, abdomen and pelvis was performed following the standard protocol without IV contrast. COMPARISON:  Prior CTs 03/19/2021 and 12/13/2020 FINDINGS: CT CHEST FINDINGS Cardiovascular: Again demonstrated  is diffuse atherosclerosis of the aorta, great vessels and coronary  arteries. There are probable calcifications of the aortic valve. The heart size is normal. There is no pericardial effusion. Mediastinum/Nodes: There are no enlarged mediastinal, hilar or axillary lymph nodes. Hilar assessment is limited by the lack of intravenous contrast, although the hilar contours appear unchanged. The thyroid gland, trachea and esophagus demonstrate no significant findings. Lungs/Pleura: No pleural effusion or pneumothorax. Moderate centrilobular and paraseptal emphysema with stable right apical scarring. There are innumerable new and enlarging small pulmonary nodules bilaterally consistent with metastatic disease. Representative nodules include a 7 mm left apical nodule on image 12/6 and a 5 mm left upper lobe nodule on image 20/6. Musculoskeletal/Chest wall: Stable expansile lytic lesion within the lower sternum and mixed sclerotic and lucent lesion within the T3 vertebral body. The soft tissue mass associated with the left scapular metastases appear smaller. No progressive osseous metastatic disease identified in the chest. CT ABDOMEN AND PELVIS FINDINGS Hepatobiliary: No focal hepatic abnormalities are identified on noncontrast imaging. No evidence of gallstones, gallbladder wall thickening or biliary dilatation. Pancreas: Unremarkable. No pancreatic ductal dilatation or surrounding inflammatory changes. Spleen: Normal in size without focal abnormality. Adrenals/Urinary Tract: Both adrenal glands appear normal. The left kidney appears unremarkable as imaged in the noncontrast state. No evidence of local recurrence within the right nephrectomy bed. The bladder appears unremarkable. Stomach/Bowel: Enteric contrast was administered and has passed into the distal colon. Gastric wall thickening appears similar to the previous study. No evidence bowel wall thickening, distention or surrounding inflammation. There are scattered diverticular changes throughout the distal colon. Vascular/Lymphatic:  There are no enlarged abdominal or pelvic lymph nodes. Aortic and branch vessel atherosclerosis. No acute vascular findings identified on noncontrast imaging. Reproductive: The prostate gland and seminal vesicles appear unremarkable. Other: No ascites or peritoneal nodularity. Musculoskeletal: The lytic metastasis involving the right posterior acetabulum (3.7 x 3.6 cm on image 106/2) and the right ischium and inferior pubic ramus have not significantly changed. However, there is an enlarging lytic metastasis involving the L3 vertebral body and its posterior elements on the right, measuring up to 7.4 x 4.0 cm on image 72/2. The soft tissue components of this mass extend into the spinal canal and contribute to moderate right L3-4 neural foraminal narrowing. No other progressive osseous metastases identified. IMPRESSION: 1. Widespread small pulmonary metastases. 2. Enlarging lytic metastasis involving the L3 vertebral body and its posterior elements on the right with associated mass effect on the thecal sac and right L3-4 foramen. This mass could contribute to nerve root encroachment. Consider further evaluation with lumbar MRI. 3. The other known osseous metastases are grossly stable. No evidence of local recurrence status post right nephrectomy. 4. Coronary and Aortic Atherosclerosis (ICD10-I70.0). Emphysema (ICD10-J43.9). 5. These results will be called to the ordering clinician or representative by the Radiologist Assistant, and communication documented in the PACS or [employer]. Electronically Signed   By: Richardean Sale M.D.   On: 06/24/2021 11:20   CT Chest Wo Contrast  Result Date: 06/24/2021 CLINICAL DATA:  Metastatic renal cell carcinoma. EXAM: CT CHEST, ABDOMEN AND PELVIS WITHOUT CONTRAST TECHNIQUE: Multidetector CT imaging of the chest, abdomen and pelvis was performed following the standard protocol without IV contrast. COMPARISON:  Prior CTs 03/19/2021 and 12/13/2020 FINDINGS: CT CHEST  FINDINGS Cardiovascular: Again demonstrated is diffuse atherosclerosis of the aorta, great vessels and coronary arteries. There are probable calcifications of the aortic valve. The heart size is normal. There is no pericardial effusion. Mediastinum/Nodes:  There are no enlarged mediastinal, hilar or axillary lymph nodes. Hilar assessment is limited by the lack of intravenous contrast, although the hilar contours appear unchanged. The thyroid gland, trachea and esophagus demonstrate no significant findings. Lungs/Pleura: No pleural effusion or pneumothorax. Moderate centrilobular and paraseptal emphysema with stable right apical scarring. There are innumerable new and enlarging small pulmonary nodules bilaterally consistent with metastatic disease. Representative nodules include a 7 mm left apical nodule on image 12/6 and a 5 mm left upper lobe nodule on image 20/6. Musculoskeletal/Chest wall: Stable expansile lytic lesion within the lower sternum and mixed sclerotic and lucent lesion within the T3 vertebral body. The soft tissue mass associated with the left scapular metastases appear smaller. No progressive osseous metastatic disease identified in the chest. CT ABDOMEN AND PELVIS FINDINGS Hepatobiliary: No focal hepatic abnormalities are identified on noncontrast imaging. No evidence of gallstones, gallbladder wall thickening or biliary dilatation. Pancreas: Unremarkable. No pancreatic ductal dilatation or surrounding inflammatory changes. Spleen: Normal in size without focal abnormality. Adrenals/Urinary Tract: Both adrenal glands appear normal. The left kidney appears unremarkable as imaged in the noncontrast state. No evidence of local recurrence within the right nephrectomy bed. The bladder appears unremarkable. Stomach/Bowel: Enteric contrast was administered and has passed into the distal colon. Gastric wall thickening appears similar to the previous study. No evidence bowel wall thickening, distention or  surrounding inflammation. There are scattered diverticular changes throughout the distal colon. Vascular/Lymphatic: There are no enlarged abdominal or pelvic lymph nodes. Aortic and branch vessel atherosclerosis. No acute vascular findings identified on noncontrast imaging. Reproductive: The prostate gland and seminal vesicles appear unremarkable. Other: No ascites or peritoneal nodularity. Musculoskeletal: The lytic metastasis involving the right posterior acetabulum (3.7 x 3.6 cm on image 106/2) and the right ischium and inferior pubic ramus have not significantly changed. However, there is an enlarging lytic metastasis involving the L3 vertebral body and its posterior elements on the right, measuring up to 7.4 x 4.0 cm on image 72/2. The soft tissue components of this mass extend into the spinal canal and contribute to moderate right L3-4 neural foraminal narrowing. No other progressive osseous metastases identified. IMPRESSION: 1. Widespread small pulmonary metastases. 2. Enlarging lytic metastasis involving the L3 vertebral body and its posterior elements on the right with associated mass effect on the thecal sac and right L3-4 foramen. This mass could contribute to nerve root encroachment. Consider further evaluation with lumbar MRI. 3. The other known osseous metastases are grossly stable. No evidence of local recurrence status post right nephrectomy. 4. Coronary and Aortic Atherosclerosis (ICD10-I70.0). Emphysema (ICD10-J43.9). 5. These results will be called to the ordering clinician or representative by the Radiologist Assistant, and communication documented in the PACS or [employer]. Electronically Signed   By: Richardean Sale M.D.   On: 06/24/2021 11:20      IMPRESSION/PLAN: This visit was conducted via MyChart to spare the patient unnecessary potential exposure in the healthcare setting during the current COVID-19 pandemic. 1. 75 y.o. 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  Today, I talked to the patient and his wife about the findings and workup thus far. We reviewed his imaging and discussed the natural history of renal cell carcinoma and general treatment, highlighting the role of radiotherapy in the management of osseous metastatic disease. We discussed the available radiation techniques, and focused on the details and logistics of delivery. The recommendation is to proceed with a 5 fraction course of palliative  stereotactic body radiotherapy to the to the enlarging lytic metastasis at the L3.  We reviewed the anticipated acute and late sequelae associated with radiation in this setting. The patient was encouraged to ask questions that were answered to his stated satisfaction.  At the end of our conversation, the patient would like to proceed with the recommended 5 fraction course of palliative stereotactic body radiotherapy to the enlarging lesion at L3.  He appears to have a good understanding of his disease and our treatment recommendations which are of palliative intent. He is scheduled for CT simulation/treatment planning at 2 pm on Friday, 07/05/2021 with plans to begin his treatments the following week.  We will share our discussion with Dr. Alen Blew and look forward to continuing to participate in his care.  Given current concerns for patient exposure during the COVID-19 pandemic, this encounter was conducted via video-enabled telephone visit. The patient has given verbal consent for this type of encounter. The time spent during this encounter was 60 minutes. The attendants for this meeting include Felina Tello PA-C, patient, James Massey and his wife, James Massey. During the encounter, Honestie Kulik PA-C was located at [facility] Rad[facility] Oncology Department.  Patient, James Massey and his wife, James Massey, were located at home.    Nicholos Johns, PA-C    Tyler Pita, MD  [facility] Direct Dial: [phone]   Fax: [phone] [email or web address]   Skype   LinkedIn

## 2021-07-04 NOTE — Addendum Note (Signed)
Encounter addended by: Freeman Caldron, PA-C on: 07/04/2021 1:59 PM  Actions taken: External Videoconference Connected

## 2021-07-04 NOTE — Therapy (Signed)
[facility] [address]  [place], [place], [address] Phone: [phone]   Fax:  [phone]  Physical Therapy Treatment / Discharge Summary  Patient Details  Name: James Massey MRN: [id number] Date of Birth: 09/02/1945 Referring Provider (PT): James Radar, PA-C  Progress Note  Reporting Period 05/20/2021 to 07/04/2021  See note below for Objective Data and Assessment of Progress/Goals.     Encounter Date: 07/04/2021   PT End of Session - 07/04/21 1535     Visit Number 24    Number of Visits 30    Date for PT Re-Evaluation 07/08/21    Authorization Type Medicare & BCBS    Progress Note Due on Visit --    PT Start Time 1535    PT Stop Time 1626    PT Time Calculation (min) 51 min    Activity Tolerance Patient tolerated treatment well;Patient limited by pain;Patient limited by fatigue    Behavior During Therapy [facility] for tasks assessed/performed             Past Medical History:  Diagnosis Date   Anxiety    Barrett esophagus    GERD (gastroesophageal reflux disease)    Gout    Heart murmur    History of anemia    Kidney cancer, primary, 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 to other site [facility])    Mixed hyperlipidemia    Renal cell cancer, right ([place])    Right renal mass    Wears glasses     Past Surgical History:  Procedure Laterality Date   CYSTOSCOPY WITH RETROGRADE PYELOGRAM, URETEROSCOPY AND STENT PLACEMENT Right 05/21/2018   Procedure: RIGHT RETROGRADE PYELOGRAM, RIGHT DIAGNOSTIC URETEROSCOPY AND STENT PLACEMENT;  Surgeon: James Hughs, MD;  Location: [facility];  Service: Urology;  Laterality: Right;   INGUINAL HERNIA REPAIR Bilateral 09/2012   LAPAROSCOPIC NEPHRECTOMY, HAND ASSISTED Right 05/31/2018   Procedure: LAPAROSCOPIC RADICAL RIGHT NEPHRECTOMY;  Surgeon: James Hughs, MD;  Location: [facility];  Service: Urology;  Laterality: Right;    There were no vitals filed for this  visit.   Subjective Assessment - 07/04/21 1554     Subjective Pt & wife report his most recent cancer f/u CT scan shows worsening of his metastatic disease at L3 for which he will need radiation treatments starting in the next week or two. He is still interested in exending his PT but would like to wait until he has completed the radiation therapy.    Patient is accompained by: Family member   [name]   Diagnostic tests 06/22/21 CT abdomen & pelvis: Enlarging lytic metastasis involving the L3 vertebral body and  its posterior elements on the right with associated mass effect on the thecal sac and right L3-4 foramen. This mass could contribute to  nerve root encroachment. Consider further evaluation with lumbar MRI.    Patient Stated Goals "ideally to be able to walk w/o having to use the walker"    Currently in Pain? Yes    Pain Score 8    7-8/10   Pain Location Hip    Pain Orientation Right    Pain Descriptors / Indicators Other (Comment);Sharp   "just there during the day but sharp at night"   Pain Type Chronic pain    Pain Score 8   7-8/10   Pain Location Knee    Pain Orientation Right    Pain Descriptors / Indicators Other (Comment);Sharp   "just there during the  day but sharp at night"   Pain Type Chronic pain                [facility] PT Assessment - 07/04/21 1535       Assessment   Medical Diagnosis Deconditioning    Referring Provider (PT) James Radar, PA-C    Onset Date/Surgical Date --   hospitalized in July but declining for a few months prior     Strength   Right Hip Flexion 4-/5    Right Hip Extension 4-/5    Right Hip External Rotation  4-/5   limited ROM & cramping   Right Hip Internal Rotation 4/5    Right Hip ABduction 4/5    Right Hip ADduction 4-/5    Left Hip Flexion 4+/5    Left Hip Extension 4/5    Left Hip External Rotation 4+/5    Left Hip Internal Rotation 5/5    Left Hip ABduction 4+/5    Left Hip ADduction 5/5    Right Knee Flexion 4/5     Right Knee Extension 4/5    Left Knee Flexion 4+/5    Left Knee Extension 5/5    Right Ankle Dorsiflexion 2/5    Left Ankle Dorsiflexion 4+/5      Ambulation/Gait   Ambulation/Gait Assistance 5: Supervision    Ambulation Distance (Feet) 80 Feet    Assistive device Rollator    Gait Pattern Step-through pattern;Decreased weight shift to right;Decreased stance time - right;Decreased step length - left;Right flexed knee in stance;Decreased hip/knee flexion - right;Decreased hip/knee flexion - left;Trunk flexed;Decreased dorsiflexion - right;Poor foot clearance - right   decreased R hip extension   Ambulation Surface Level;Indoor    Gait velocity 1.05 ft/sec      Standardized Balance Assessment   10 Meter Walk 31.12 sec with rollator      Berg Balance Test   Berg comment: Unable to assess d/t increased R LE pain      Timed Up and Go Test   Normal TUG (seconds) --   Unable to assess d/t increased R LE pain                          [facility] Adult PT Treatment/Exercise - 07/04/21 1535       Knee/Hip Exercises: Aerobic   Nustep L6 x 8 min (UE/LE)                       PT Short Term Goals - 04/09/21 1500       PT SHORT TERM GOAL #1   Title Patient will be independent with initial HEP    Status Achieved   02/25/21     PT SHORT TERM GOAL #2   Title Patient will demonstrate safe transfer technique and proper gait pattern with 4-wheel rolling walker or LRAD as indicated    Status Achieved   04/09/21   Target Date --      PT SHORT TERM GOAL #3   Title Patient will increase gait speed to >/= 2.0 ft/sec with RW or LRAD to decrease risk for recurrent falls    Baseline 1.64 ft/sec with RW    Status Achieved   04/09/21 - gait speed 2.29 ft/sec with rollator   Target Date --      PT SHORT TERM GOAL #4   Title Determine balance goals based on results of standardized balance testing    Status Achieved   02/11/21  PT Long Term Goals - 07/04/21  1621       PT LONG TERM GOAL #1   Title Patient will be independent with ongoing/advanced HEP for self-management at home in order to build upon functional gains in therapy    Status Achieved   07/04/21   Target Date 07/08/21      PT LONG TERM GOAL #2   Title Decrease R hip and knee pain by >/= 50% during transitional movements allowing patient increased ease of mobiliy and transfers    Status Not Met   06/11/21 - pt reporting 30-40% improvement in pain; 07/04/21 - worsening R LE pain which may be related to L3 radiculopathy from worsening metastatic disease   Target Date 07/08/21      PT LONG TERM GOAL #3   Title Patient will demonstrate improved B LE strength to >/= 4+/5 for improved stability and ease of mobility    Status Partially Met   07/04/21 - met for L LE; pain limiting MMT on R   Target Date 07/08/21      PT LONG TERM GOAL #4   Title Patient will increase gait speed to >/= 2.62 ft/sec with rollator, SPC or LRAD to increase safety with community ambulation    Baseline 1.64 ft/sec with RW    Status Not Met   03/26/21 - 1.96 ft/sec with rollator; 04/09/21 - 2.29 ft/sec with rollator; 05/09/21 - 2.23 ft/sec with rollator; 05/20/21 - 2.00 ft/sec with rollator; 07/04/21 - 1.5 ft/sec with rollator   Target Date 07/08/21      PT LONG TERM GOAL #5   Title Patient will demonstrate decreased TUG time to </= 18 sec with SPC or LRAD to decrease risk for falls with transitional mobility    Baseline 26.5 sec with RW    Status Unable to assess   03/26/21 - TUG = 22.34 sec with rollator; 05/09/21 - 23.59 sec with rollator; 05/20/21 - 25.97 sec with rollator;  07/04/21 - unable to assess d/t increased pain   Target Date 07/08/21      PT LONG TERM GOAL #6   Title Patient will improve Berg score to >/= 40/56 to improve safety stability with ADLs in standing and reduce risk for falls    Baseline 26/56    Status Unable to assess   03/26/21 Merrilee Jansky = 36/56; 05/20/21 - 34/56; 07/04/21 - unable to assess  d/t increased pain   Target Date 07/08/21                   Plan - 07/04/21 1626     Clinical Impression Statement James Massey arrives to PT reporting increased R LE pain, esp at R hip and knee. He and his wife report that his recent oncology CT scan of his abdomen and pelvis show worsening of the metastasis at his L3 vertebrae. The CT report also indicates associated mass effect on the thecal sac and right L3-4 foramen which may be contributing to nerve root encroachment which could explain his worsening R LE pain as well as worsening R foot drop. Pain levels today impacting mobility, gait and activity tolerance, limiting MMT to seated position and preventing participation in standardized balance testing, thus majority of goals unchanged as with LE strength, regressed as with gait speed due to pain and worsening foot drop, or unable to assess despite previously demonstrated gains with PT. His has met his HEP LTG and has been trying to complete exercises and walking for exercise within his home  as pain allows with the assistance of his CNA. James Massey and his wife are interested in continuing with PT but given current pain levels and upcoming radiation therapy, we discussed taking a break from PT for at least a month to see how he responds to the radiation therapy and will plan to request new PT orders to resume PT in February if cleared by MD. James Massey and his wife cautioned to monitor R foot drop to avoid fall risk and discussed possible need for orthotics consult if not improved following radiation therapy when he returns to PT.    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, L3, pelvis & R acetabulum) and lungs; R nephrectomy 2 cancer; OA; gout; GERD    PT Duration --   6-7 weeks   PT Treatment/Interventions ADLs/Self Care Home Management;Cryotherapy;Moist Heat;DME Instruction;Gait training;Stair training;Functional mobility training;Therapeutic activities;Therapeutic  exercise;Balance training;Neuromuscular re-education;Patient/family education;Manual techniques;Passive range of motion;Dry needling;Taping;Vasopneumatic Device;Electrical Stimulation;Iontophoresis 25m/ml Dexamethasone    PT Next Visit Plan Discharge while pt prepares to undergo radiation therapy for his metastatic disease at L3    PT Home Exercise Plan Access Code: [email or web address] (8/4, updated 8/8, 9/28, 10/4, 11/23 & 12/1); corner balance progression (12/1)    Consulted and Agree with Plan of Care Patient;Family member/caregiver    Family Member Consulted wife - CHoyle Massey            Patient will benefit from skilled therapeutic intervention in order to improve the following deficits and impairments:  Abnormal gait, Decreased activity tolerance, Decreased balance, Decreased endurance, Decreased knowledge of precautions, Decreased knowledge of use of DME, Decreased mobility, Decreased range of motion, Decreased safety awareness, Decreased strength, Difficulty walking, Increased fascial restricitons, Increased muscle spasms, Impaired perceived functional ability, Impaired flexibility, Improper body mechanics, Postural dysfunction, Pain  Visit Diagnosis: Muscle weakness (generalized)  Unsteadiness on feet  Other abnormalities of gait and mobility  Difficulty in walking, not elsewhere classified  Chronic pain of right knee  Pain in right hip     Problem List Patient Active Problem List   Diagnosis Date Noted   B12 deficiency 01/16/2021   Depression 01/16/2021   Hypoalbuminemia 01/16/2021   Generalized weakness 01/16/2021   Hypothyroidism (acquired) 01/13/2021   Altered mental status 029/47/6546  Acute metabolic encephalopathy 050/35/4656  Hypokalemia 01/09/2021   Hypomagnesemia 01/09/2021   Hypocalcemia 01/09/2021   Primary malignant neoplasm of kidney 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 to other site ([facility] 09/05/2020   Goals of care, counseling/discussion 09/05/2020   [place]ast[place]  [place]   History of right nephrectomy 08/13/2018   Renal mass, left 05/31/2018   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    Education / Equipment:   HEP   Patient agrees to discharge. Patient goals were partially met. Patient is being discharged due to a change in medical status.  JPercival Spanish PT 07/04/2021, 5:32 PM  [facility] [place] [address]Phone: [phone]  Fax:  [phone] Name: James WeidaMRN: [id number] of Birth: [birth year]

## 2021-07-04 NOTE — Addendum Note (Signed)
Encounter addended by: Tyler Pita, MD on: 07/04/2021 2:05 PM  Actions taken: Medication List reviewed, Problem List reviewed, Allergies reviewed, External Videoconference Connected, External Videoconference Disconnected

## 2021-07-04 NOTE — Addendum Note (Signed)
Encounter addended by: Mollie Germany, LPN on: 83/67/2550 0:16 PM  Actions taken: External Videoconference Connected

## 2021-07-04 NOTE — Telephone Encounter (Signed)
Sch per 12/20 los, pt wife aware °

## 2021-07-05 ENCOUNTER — Other Ambulatory Visit: Payer: Self-pay | Admitting: Oncology

## 2021-07-05 ENCOUNTER — Ambulatory Visit
Admission: RE | Admit: 2021-07-05 | Discharge: 2021-07-05 | Disposition: A | Discharge status: Home / Self Care | Payer: Medicare Other | Source: Ambulatory Visit | Attending: Radiation Oncology | Admitting: Radiation Oncology

## 2021-07-05 DIAGNOSIS — C7951 Secondary malignant neoplasm of bone: Secondary | ICD-10-CM | POA: Diagnosis present

## 2021-07-05 DIAGNOSIS — C649 Malignant neoplasm of unspecified kidney, except renal pelvis: Secondary | ICD-10-CM

## 2021-07-08 NOTE — Progress Notes (Signed)
°  Radiation Oncology         [phone] ________________________________  Name: James Massey MRN: [id number]  Date: 07/05/2021  DOB: [birth year]  STEREOTACTIC BODY RADIOTHERAPY SIMULATION AND TREATMENT PLANNING NOTE    ICD-10-CM   1. 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  NARRATIVE:  The patient was brought to the [facility].  Identity was confirmed.  All relevant records and images related to the planned course of therapy were reviewed.  The patient freely provided informed written consent to proceed with treatment after reviewing the details related to the planned course of therapy. The consent form was witnessed and verified by the simulation staff.  Then, the patient was set-up in a stable reproducible  supine position for radiation therapy.  A BodyFix immobilization pillow was fabricated for reproducible positioning.  Surface markings were placed.  The CT images were loaded into the planning software.  The gross target volumes (GTV) and planning target volumes (PTV) were delinieated, and avoidance structures were contoured.  Treatment planning then occurred.  The radiation prescription was entered and confirmed.  A total of two complex treatment devices were fabricated in the form of the BodyFix immobilization pillow and a neck accuform cushion.  I have requested : 3D Simulation  I have requested a DVH of the following structures: targets and all normal structures near the target including spinal cord, kidney, skin and bowel as noted on the radiation plan to maintain doses in adherence with established limits  SPECIAL TREATMENT PROCEDURE:  The planned course of therapy using radiation constitutes a special treatment procedure. Special care is required in the management of this patient for the following reasons.  High dose per fraction requiring special monitoring for increased toxicities of treatment including daily imaging..  The special nature of the planned course of radiotherapy will require increased physician supervision and oversight to ensure patient's safety with optimal treatment outcomes.    This requires extended time and effort.    PLAN:  The patient will receive 45 Gy in 5 fractions.  ________________________________  Sheral Apley Tammi Klippel, M.D.

## 2021-07-17 ENCOUNTER — Ambulatory Visit: Payer: Medicare Other

## 2021-07-18 ENCOUNTER — Ambulatory Visit: Payer: Medicare Other

## 2021-07-19 ENCOUNTER — Ambulatory Visit: Payer: Medicare Other

## 2021-07-19 DIAGNOSIS — C7951 Secondary malignant neoplasm of bone: Secondary | ICD-10-CM | POA: Insufficient documentation

## 2021-07-22 ENCOUNTER — Emergency Department (HOSPITAL_COMMUNITY): Payer: Medicare Other

## 2021-07-22 ENCOUNTER — Telehealth: Payer: Self-pay

## 2021-07-22 ENCOUNTER — Other Ambulatory Visit: Payer: Self-pay

## 2021-07-22 ENCOUNTER — Inpatient Hospital Stay (HOSPITAL_COMMUNITY): Payer: Medicare Other

## 2021-07-22 ENCOUNTER — Ambulatory Visit: Payer: Medicare Other

## 2021-07-22 ENCOUNTER — Inpatient Hospital Stay (HOSPITAL_COMMUNITY)
Admission: EM | Admit: 2021-07-22 | Discharge: 2021-08-07 | DRG: 542 | Disposition: E | Payer: Medicare Other | Attending: Pulmonary Disease | Admitting: Pulmonary Disease

## 2021-07-22 ENCOUNTER — Encounter (HOSPITAL_COMMUNITY): Payer: Self-pay

## 2021-07-22 DIAGNOSIS — T462X5A Adverse effect of other antidysrhythmic drugs, initial encounter: Secondary | ICD-10-CM | POA: Diagnosis not present

## 2021-07-22 DIAGNOSIS — I4892 Unspecified atrial flutter: Secondary | ICD-10-CM | POA: Diagnosis present

## 2021-07-22 DIAGNOSIS — I471 Supraventricular tachycardia: Secondary | ICD-10-CM | POA: Diagnosis present

## 2021-07-22 DIAGNOSIS — Z7189 Other specified counseling: Secondary | ICD-10-CM

## 2021-07-22 DIAGNOSIS — I2489 Other forms of acute ischemic heart disease: Secondary | ICD-10-CM | POA: Diagnosis present

## 2021-07-22 DIAGNOSIS — C641 Malignant neoplasm of right kidney, except renal pelvis: Secondary | ICD-10-CM | POA: Diagnosis not present

## 2021-07-22 DIAGNOSIS — F32A Depression, unspecified: Secondary | ICD-10-CM | POA: Diagnosis present

## 2021-07-22 DIAGNOSIS — Z515 Encounter for palliative care: Secondary | ICD-10-CM | POA: Diagnosis not present

## 2021-07-22 DIAGNOSIS — C7801 Secondary malignant neoplasm of right lung: Secondary | ICD-10-CM | POA: Diagnosis present

## 2021-07-22 DIAGNOSIS — I248 Other forms of acute ischemic heart disease: Secondary | ICD-10-CM | POA: Diagnosis present

## 2021-07-22 DIAGNOSIS — I480 Paroxysmal atrial fibrillation: Secondary | ICD-10-CM | POA: Diagnosis present

## 2021-07-22 DIAGNOSIS — Z20822 Contact with and (suspected) exposure to covid-19: Secondary | ICD-10-CM | POA: Diagnosis present

## 2021-07-22 DIAGNOSIS — R579 Shock, unspecified: Secondary | ICD-10-CM

## 2021-07-22 DIAGNOSIS — I214 Non-ST elevation (NSTEMI) myocardial infarction: Secondary | ICD-10-CM

## 2021-07-22 DIAGNOSIS — Z66 Do not resuscitate: Secondary | ICD-10-CM

## 2021-07-22 DIAGNOSIS — E43 Unspecified severe protein-calorie malnutrition: Secondary | ICD-10-CM | POA: Diagnosis present

## 2021-07-22 DIAGNOSIS — C7802 Secondary malignant neoplasm of left lung: Secondary | ICD-10-CM | POA: Diagnosis present

## 2021-07-22 DIAGNOSIS — Z79899 Other long term (current) drug therapy: Secondary | ICD-10-CM

## 2021-07-22 DIAGNOSIS — E222 Syndrome of inappropriate secretion of antidiuretic hormone: Secondary | ICD-10-CM | POA: Diagnosis present

## 2021-07-22 DIAGNOSIS — I2510 Atherosclerotic heart disease of native coronary artery without angina pectoris: Secondary | ICD-10-CM | POA: Diagnosis present

## 2021-07-22 DIAGNOSIS — Z85528 Personal history of other malignant neoplasm of kidney: Secondary | ICD-10-CM

## 2021-07-22 DIAGNOSIS — G55 Nerve root and plexus compressions in diseases classified elsewhere: Secondary | ICD-10-CM | POA: Diagnosis present

## 2021-07-22 DIAGNOSIS — E232 Diabetes insipidus: Secondary | ICD-10-CM | POA: Diagnosis present

## 2021-07-22 DIAGNOSIS — C7951 Secondary malignant neoplasm of bone: Secondary | ICD-10-CM | POA: Diagnosis present

## 2021-07-22 DIAGNOSIS — N1831 Chronic kidney disease, stage 3a: Secondary | ICD-10-CM | POA: Insufficient documentation

## 2021-07-22 DIAGNOSIS — R4182 Altered mental status, unspecified: Secondary | ICD-10-CM | POA: Diagnosis present

## 2021-07-22 DIAGNOSIS — Z79890 Hormone replacement therapy (postmenopausal): Secondary | ICD-10-CM

## 2021-07-22 DIAGNOSIS — E039 Hypothyroidism, unspecified: Secondary | ICD-10-CM | POA: Diagnosis present

## 2021-07-22 DIAGNOSIS — I700 Atherosclerosis of aorta: Secondary | ICD-10-CM | POA: Diagnosis present

## 2021-07-22 DIAGNOSIS — Z87891 Personal history of nicotine dependence: Secondary | ICD-10-CM

## 2021-07-22 DIAGNOSIS — R3589 Other polyuria: Secondary | ICD-10-CM

## 2021-07-22 DIAGNOSIS — Z6827 Body mass index (BMI) 27.0-27.9, adult: Secondary | ICD-10-CM

## 2021-07-22 DIAGNOSIS — Z923 Personal history of irradiation: Secondary | ICD-10-CM

## 2021-07-22 DIAGNOSIS — R531 Weakness: Secondary | ICD-10-CM | POA: Diagnosis present

## 2021-07-22 DIAGNOSIS — Z8249 Family history of ischemic heart disease and other diseases of the circulatory system: Secondary | ICD-10-CM

## 2021-07-22 DIAGNOSIS — Z905 Acquired absence of kidney: Secondary | ICD-10-CM

## 2021-07-22 DIAGNOSIS — C649 Malignant neoplasm of unspecified kidney, except renal pelvis: Secondary | ICD-10-CM | POA: Diagnosis present

## 2021-07-22 DIAGNOSIS — Z803 Family history of malignant neoplasm of breast: Secondary | ICD-10-CM

## 2021-07-22 DIAGNOSIS — N183 Chronic kidney disease, stage 3 unspecified: Secondary | ICD-10-CM

## 2021-07-22 DIAGNOSIS — R64 Cachexia: Secondary | ICD-10-CM | POA: Diagnosis present

## 2021-07-22 DIAGNOSIS — M109 Gout, unspecified: Secondary | ICD-10-CM | POA: Diagnosis present

## 2021-07-22 DIAGNOSIS — G9341 Metabolic encephalopathy: Secondary | ICD-10-CM | POA: Diagnosis present

## 2021-07-22 DIAGNOSIS — I130 Hypertensive heart and chronic kidney disease with heart failure and stage 1 through stage 4 chronic kidney disease, or unspecified chronic kidney disease: Secondary | ICD-10-CM | POA: Diagnosis present

## 2021-07-22 DIAGNOSIS — I952 Hypotension due to drugs: Secondary | ICD-10-CM | POA: Diagnosis not present

## 2021-07-22 DIAGNOSIS — M21371 Foot drop, right foot: Secondary | ICD-10-CM

## 2021-07-22 DIAGNOSIS — E782 Mixed hyperlipidemia: Secondary | ICD-10-CM | POA: Diagnosis present

## 2021-07-22 LAB — URINALYSIS, ROUTINE W REFLEX MICROSCOPIC
Bilirubin Urine: NEGATIVE
Glucose, UA: NEGATIVE mg/dL
Hgb urine dipstick: NEGATIVE
Ketones, ur: NEGATIVE mg/dL
Leukocytes,Ua: NEGATIVE
Nitrite: NEGATIVE
Protein, ur: NEGATIVE mg/dL
Specific Gravity, Urine: 1.015 (ref 1.005–1.030)
pH: 5 (ref 5.0–8.0)

## 2021-07-22 LAB — CBC WITH DIFFERENTIAL/PLATELET
Abs Immature Granulocytes: 0.07 10*3/uL (ref 0.00–0.07)
Basophils Absolute: 0.1 10*3/uL (ref 0.0–0.1)
Basophils Relative: 1 %
Eosinophils Absolute: 0.1 10*3/uL (ref 0.0–0.5)
Eosinophils Relative: 1 %
HCT: 34.5 % — ABNORMAL LOW (ref 39.0–52.0)
Hemoglobin: 11.6 g/dL — ABNORMAL LOW (ref 13.0–17.0)
Immature Granulocytes: 1 %
Lymphocytes Relative: 8 %
Lymphs Abs: 0.9 10*3/uL (ref 0.7–4.0)
MCH: 32 pg (ref 26.0–34.0)
MCHC: 33.6 g/dL (ref 30.0–36.0)
MCV: 95 fL (ref 80.0–100.0)
Monocytes Absolute: 1.1 10*3/uL — ABNORMAL HIGH (ref 0.1–1.0)
Monocytes Relative: 10 %
Neutro Abs: 8.8 10*3/uL — ABNORMAL HIGH (ref 1.7–7.7)
Neutrophils Relative %: 79 %
Platelets: 275 10*3/uL (ref 150–400)
RBC: 3.63 MIL/uL — ABNORMAL LOW (ref 4.22–5.81)
RDW: 13.2 % (ref 11.5–15.5)
WBC: 11 10*3/uL — ABNORMAL HIGH (ref 4.0–10.5)
nRBC: 0 % (ref 0.0–0.2)

## 2021-07-22 LAB — COMPREHENSIVE METABOLIC PANEL
ALT: 42 U/L (ref 0–44)
AST: 34 U/L (ref 15–41)
Albumin: 3 g/dL — ABNORMAL LOW (ref 3.5–5.0)
Alkaline Phosphatase: 89 U/L (ref 38–126)
Anion gap: 6 (ref 5–15)
BUN: 23 mg/dL (ref 8–23)
CO2: 30 mmol/L (ref 22–32)
Calcium: 11.3 mg/dL — ABNORMAL HIGH (ref 8.9–10.3)
Chloride: 96 mmol/L — ABNORMAL LOW (ref 98–111)
Creatinine, Ser: 1.58 mg/dL — ABNORMAL HIGH (ref 0.61–1.24)
GFR, Estimated: 45 mL/min — ABNORMAL LOW (ref >60)
Glucose, Bld: 104 mg/dL — ABNORMAL HIGH (ref 70–99)
Potassium: 4.4 mmol/L (ref 3.5–5.1)
Sodium: 132 mmol/L — ABNORMAL LOW (ref 135–145)
Total Bilirubin: 0.7 mg/dL (ref 0.3–1.2)
Total Protein: 6.5 g/dL (ref 6.5–8.1)

## 2021-07-22 LAB — RESP PANEL BY RT-PCR (FLU A&B, COVID) ARPGX2
Influenza A by PCR: NEGATIVE
Influenza B by PCR: NEGATIVE
SARS Coronavirus 2 by RT PCR: NEGATIVE

## 2021-07-22 LAB — RAPID URINE DRUG SCREEN, HOSP PERFORMED
Amphetamines: NOT DETECTED
Barbiturates: NOT DETECTED
Benzodiazepines: POSITIVE — AB
Cocaine: NOT DETECTED
Opiates: NOT DETECTED
Tetrahydrocannabinol: NOT DETECTED

## 2021-07-22 LAB — TROPONIN I (HIGH SENSITIVITY)
Troponin I (High Sensitivity): 146 ng/L (ref <18)
Troponin I (High Sensitivity): 174 ng/L (ref <18)

## 2021-07-22 LAB — BRAIN NATRIURETIC PEPTIDE: B Natriuretic Peptide: 62.5 pg/mL (ref 0.0–100.0)

## 2021-07-22 IMAGING — CT CT HEAD W/O CM
3 series · 14 of 47 positions shown, 16 images · non-contrast
Comparison: None.

CLINICAL DATA: Mental status change,



[Series 2: head wo · axial · 0.47mm/px · z∈[-135,-10]mm · 8 of 31 slices shown, 10 images]
[im 3/31  brain]
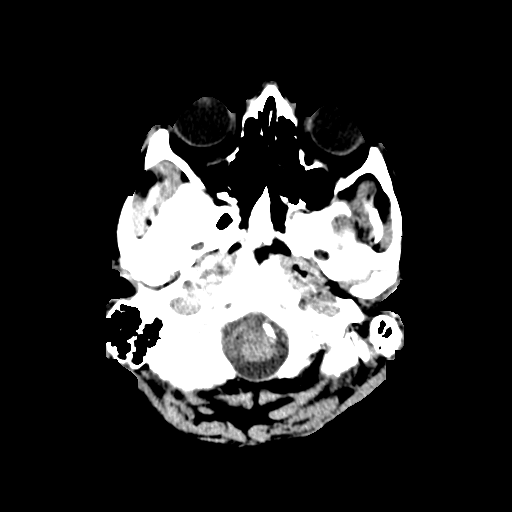
[im 3/31  bone]
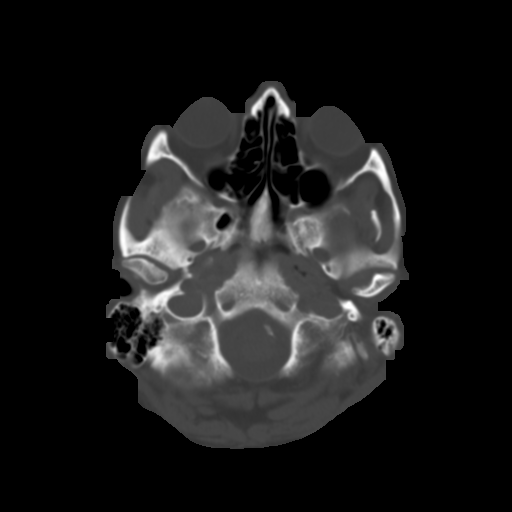
[im 7/31  brain]
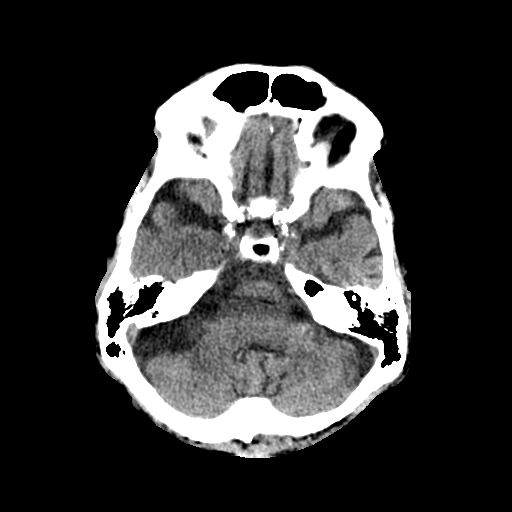
[im 10/31  brain]
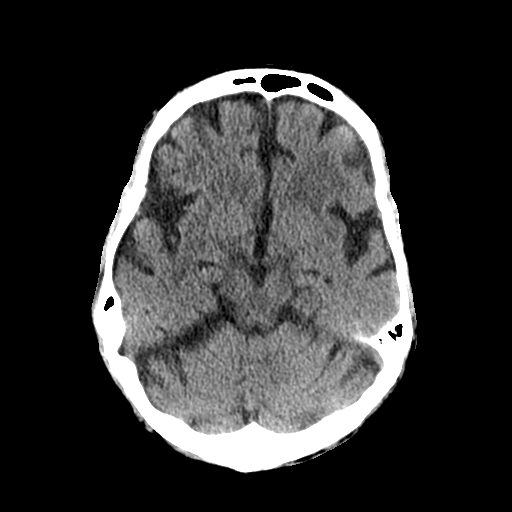
[im 14/31  brain]
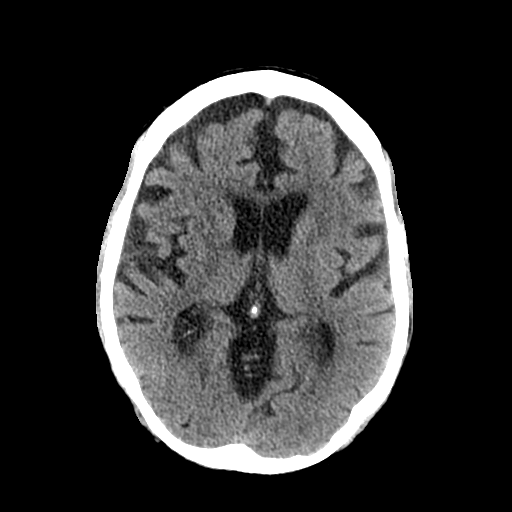
[im 17/31  brain]
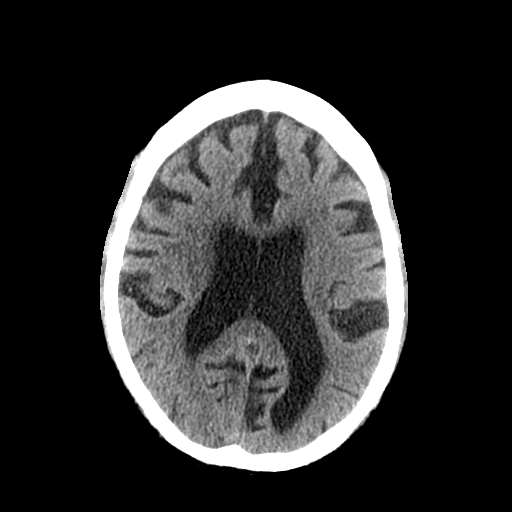
[im 17/31  bone]
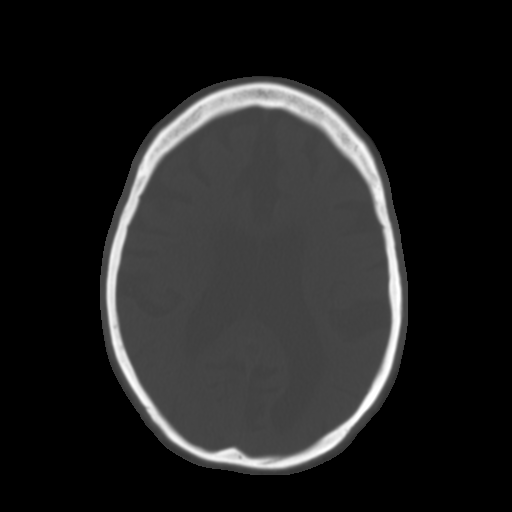
[im 21/31  brain]
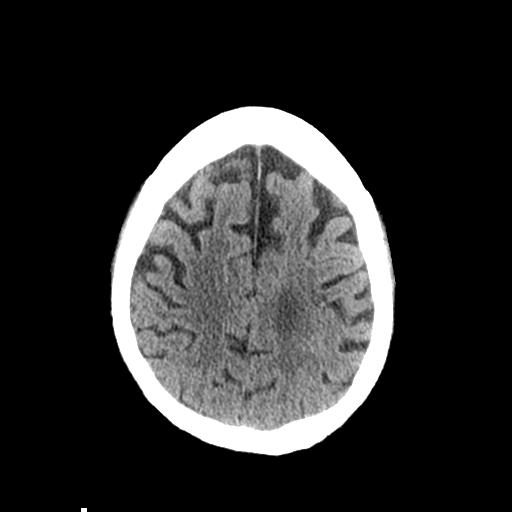
[im 24/31  brain]
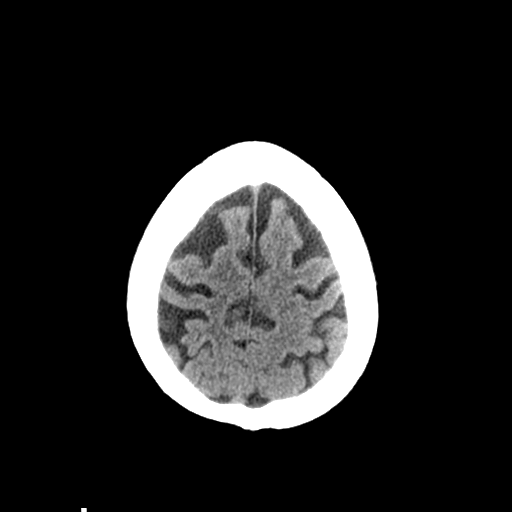
[im 28/31  brain]
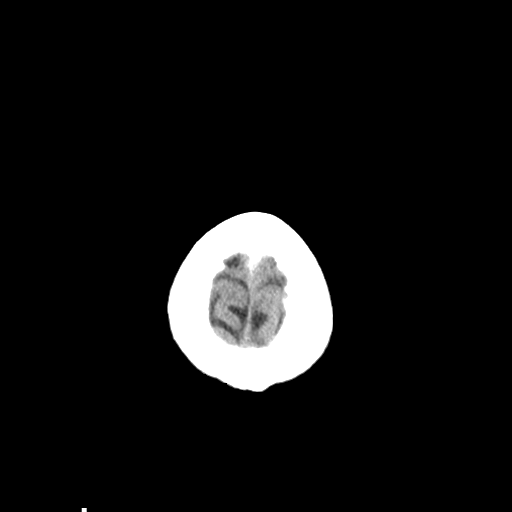

[Series 4: coronal soft tissue · coronal · 0.34mm/px · 3 of 66 slices shown]
[im 22/66  brain]
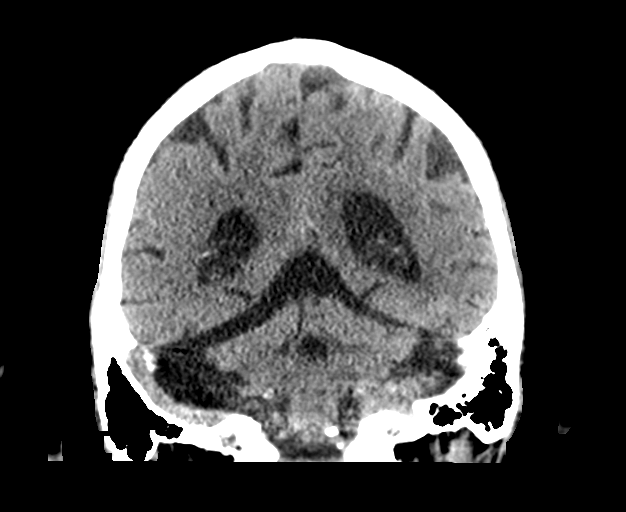
[im 29/66  brain]
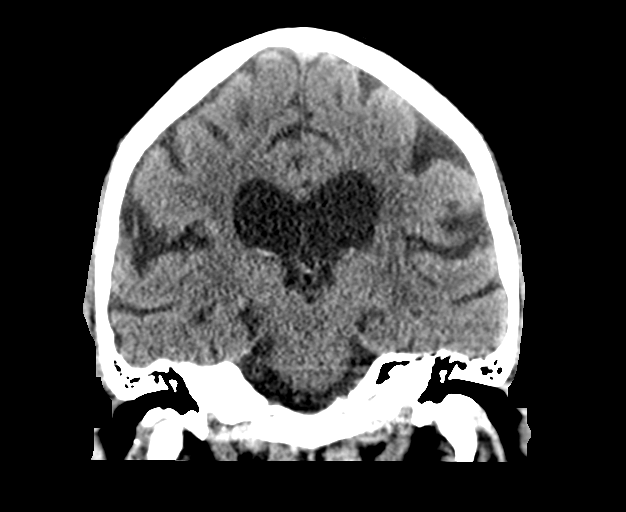
[im 37/66  brain]
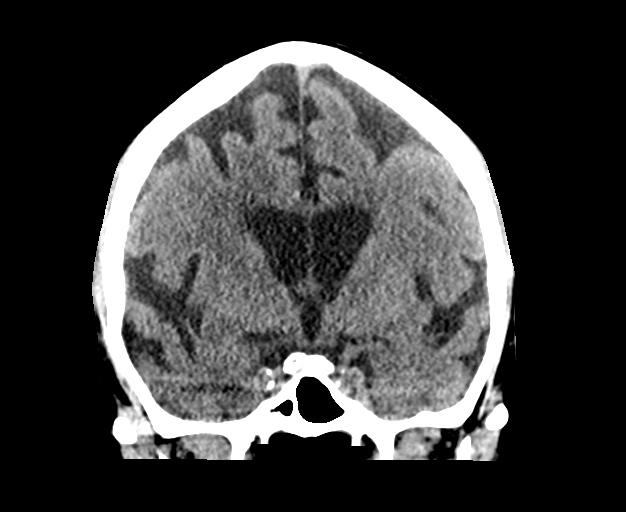

[Series 5: sagittal soft tissue · sagittal · 0.33mm/px · 3 of 54 slices shown]
[im 18/54  brain]
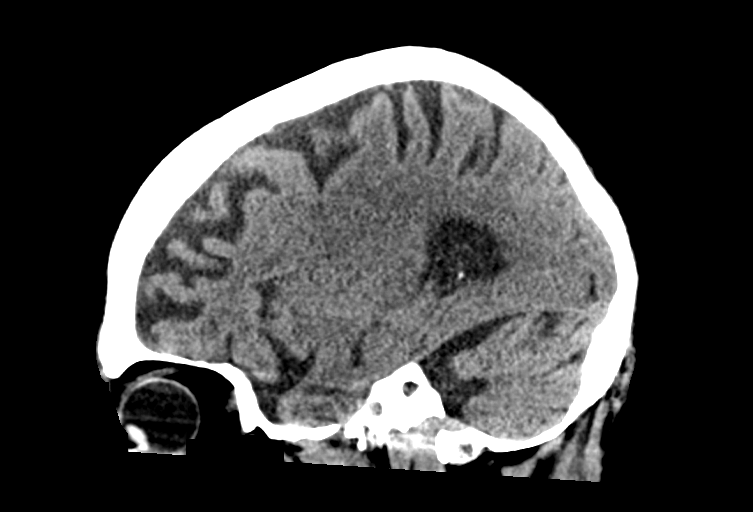
[im 27/54  brain]
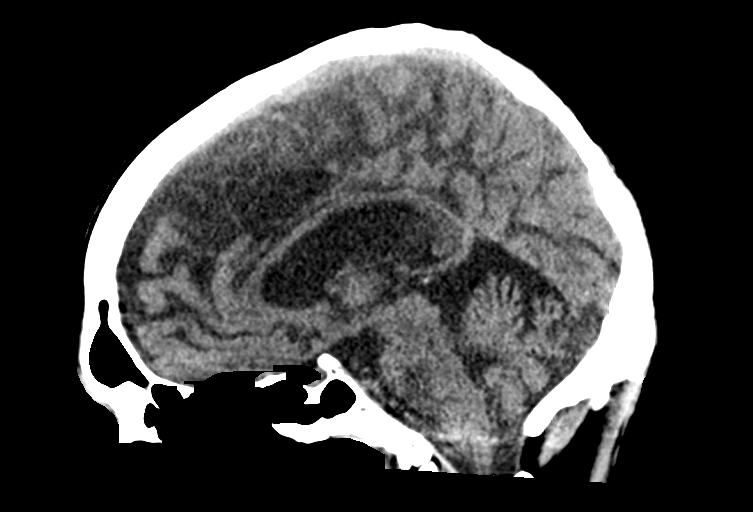
[im 36/54  brain]
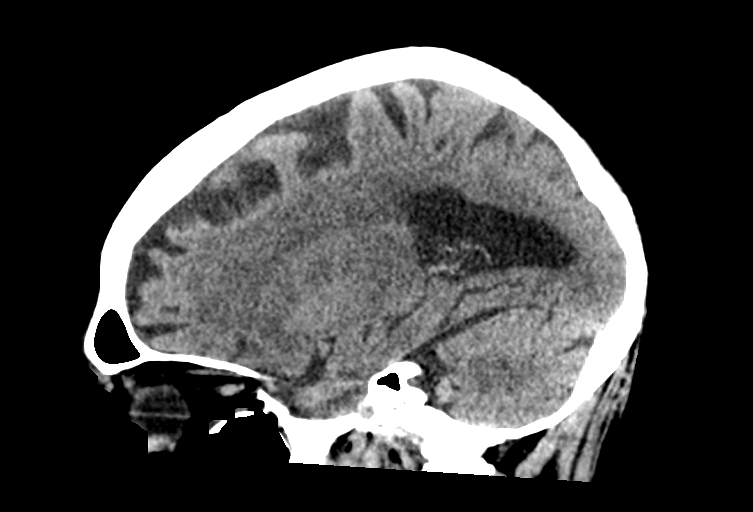

[14 of 47 positions shown; findings below may reference images not displayed]

FINDINGS: Brain: No acute intracranial hemorrhage. No focal mass lesion. No CT
evidence of acute infarction. No midline shift or mass effect. No
hydrocephalus. Basilar cisterns are patent.

There are periventricular and subcortical white matter
hypodensities. Generalized cortical atrophy.

Vascular: No hyperdense vessel or unexpected calcification.

Skull: Normal. Negative for fracture or focal lesion.

Sinuses/Orbits: Paranasal sinuses and mastoid air cells are clear.
Orbits are clear.

Other: None.
IMPRESSION: 1. No acute intracranial findings.
2. Chronic atrophy and white matter microvascular disease.

## 2021-07-22 IMAGING — DX DG CHEST 1V PORT
1 series · 1 of 1 positions shown · non-contrast
Comparison: [DATE]

CLINICAL DATA: Evaluate for pneumonia

EXAM:
PORTABLE CHEST 1 VIEW

[chest ap]
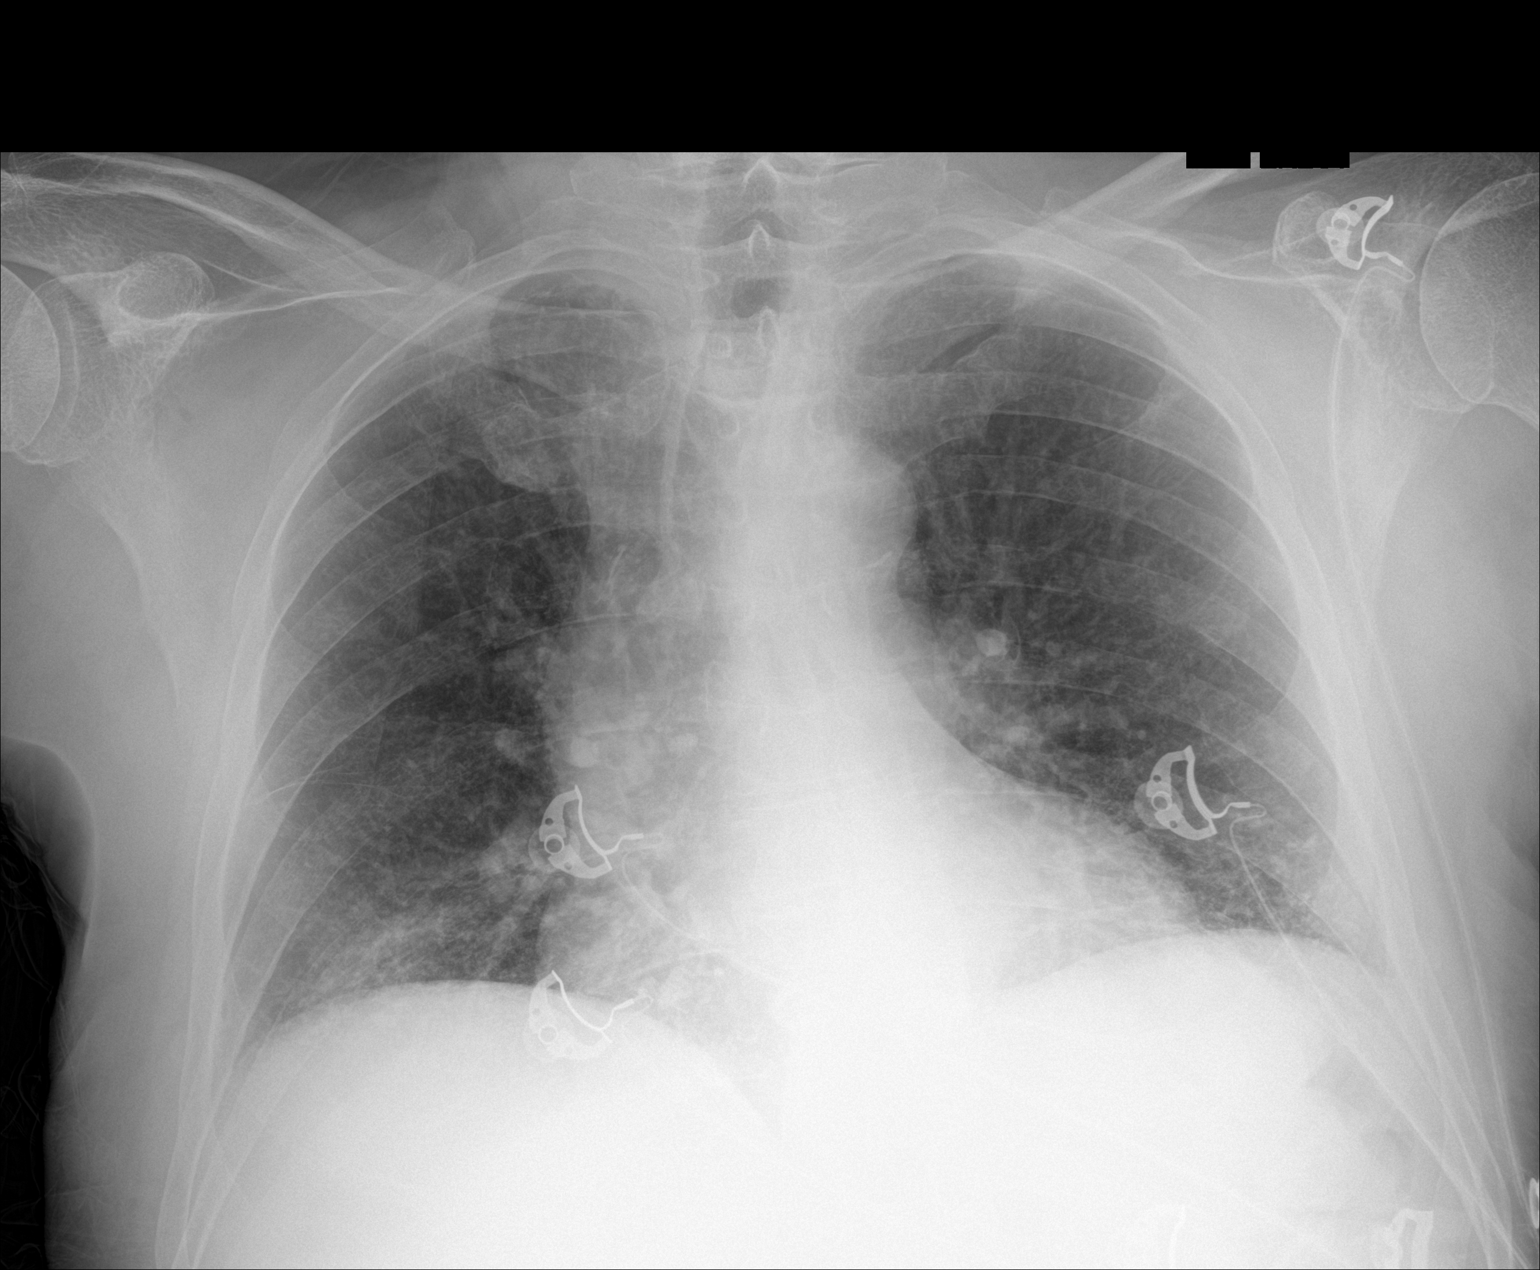

[1 of 1 positions shown; findings below may reference images not displayed]

FINDINGS: The heart size and mediastinal contours are within normal limits.
Mild increased pulmonary interstitium is identified bilaterally.
Mild hazy ground-glass opacities identified in the right lung base.
There is no pleural effusion. The visualized skeletal structures are
unremarkable.
IMPRESSION: Mild increased pulmonary interstitium identified bilaterally, this
can be seen in mild edema or viral pneumonitis. Mild hazy
ground-glass opacities identified in the right lung base,
superimposed pneumonia is not excluded.

## 2021-07-22 IMAGING — MR MR LUMBAR SPINE WO/W CM
6 of 10 series · 26 of 48 positions shown · IV contrast (gadavist)
Comparison: CT from [DATE].

CLINICAL DATA: Initial evaluation for low back pain, right foot
drop.

EXAM:
MRI LUMBAR SPINE WITHOUT AND WITH CONTRAST
TECHNIQUE: Multiplanar and multiecho pulse sequences of the lumbar spine were
obtained without and with intravenous contrast.
CONTRAST:  9mL GADAVIST GADOBUTROL 1 MMOL/ML IV SOLN

[Series 9: T1 · sagittal · 4.0mm · 0.81mm/px · 5 of 17 slices shown]
[im 1/17]
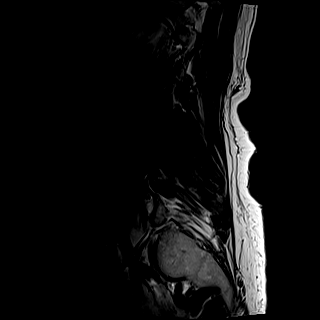
[im 5/17]
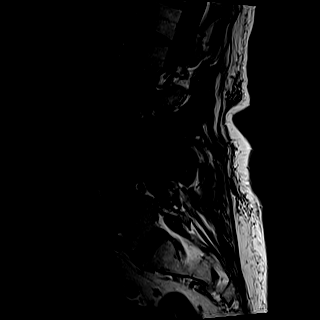
[im 9/17]
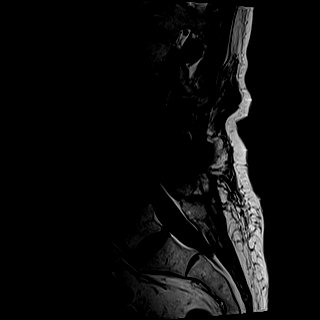
[im 13/17]
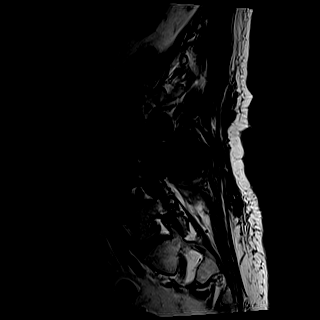
[im 17/17]
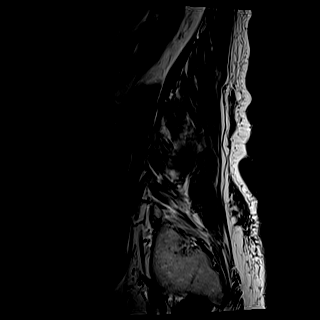

[Series 10: STIR · sagittal · 4.0mm · 0.51mm/px · 4 of 17 slices shown]
[im 1/17]
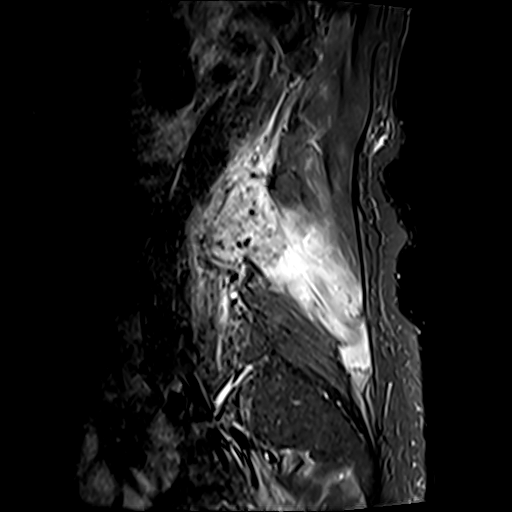
[im 6/17]
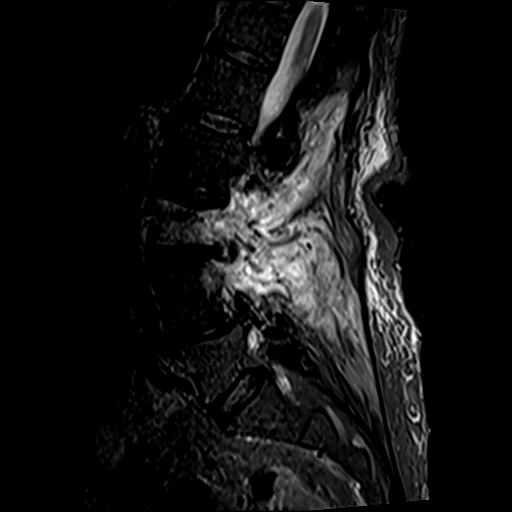
[im 11/17]
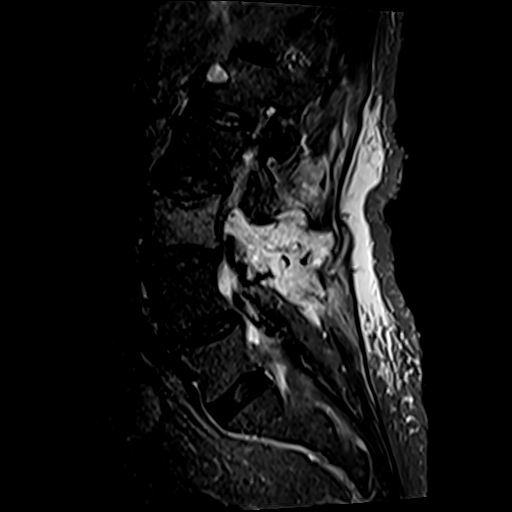
[im 17/17]
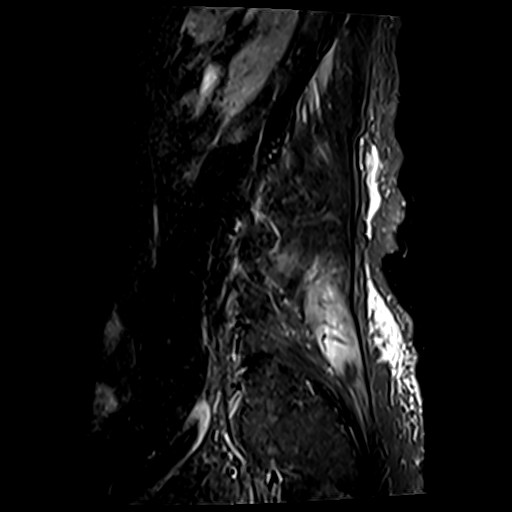

[Series 15: T2 post-contrast · sagittal · 4.0mm · 0.81mm/px · 4 of 17 slices shown]
[im 1/17]
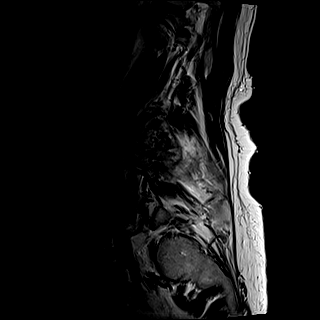
[im 6/17]
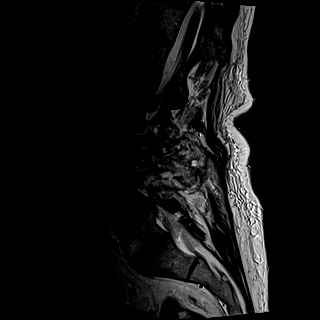
[im 11/17]
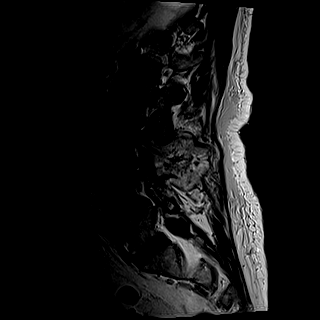
[im 17/17]
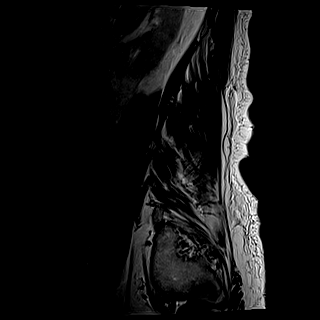

[Series 16: T1 fat-sat post-contrast · sagittal · 4.0mm · 0.81mm/px · 4 of 17 slices shown (1 of 2)]
[im 1/17]
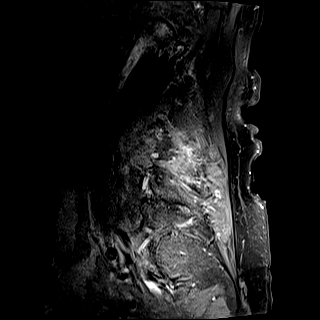
[im 6/17]
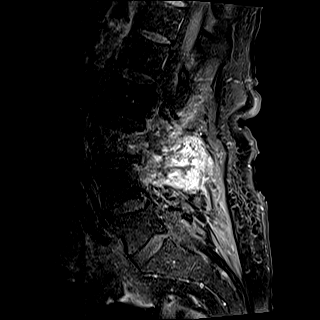
[im 11/17]
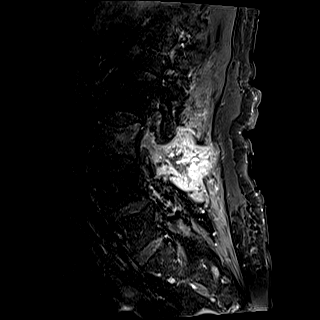
[im 17/17]
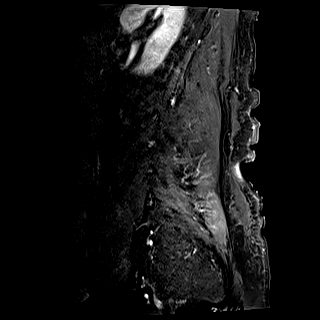

[Series 17: T1 post-contrast · axial · 4.0mm · 0.43mm/px · z∈[+66,+186]mm · 5 of 40 slices shown]
[im 1/40]
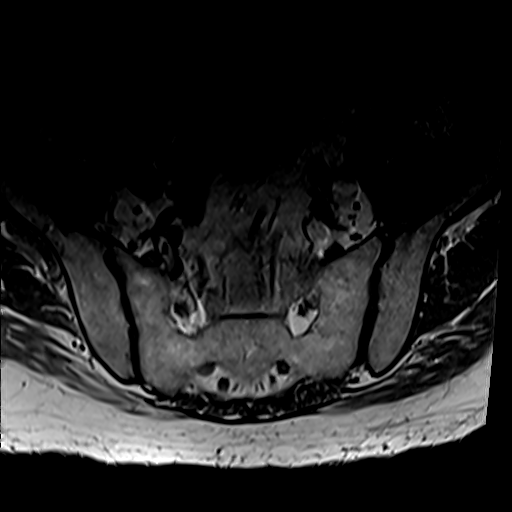
[im 5/40]
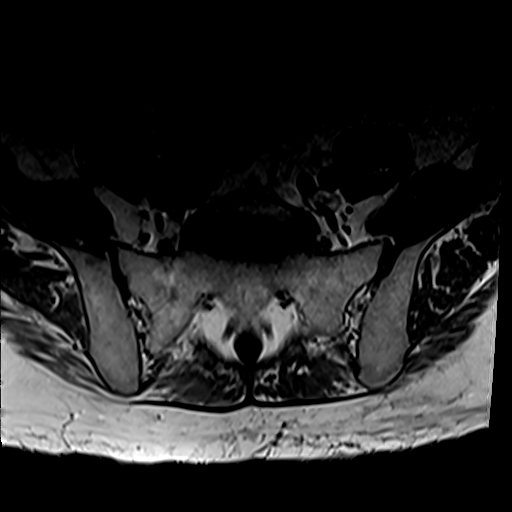
[im 10/40]
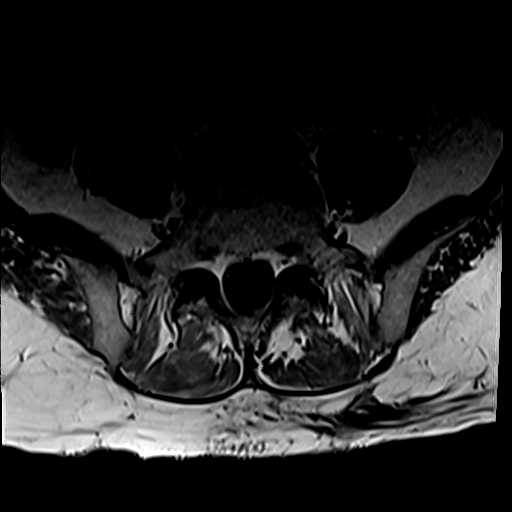
[im 15/40]
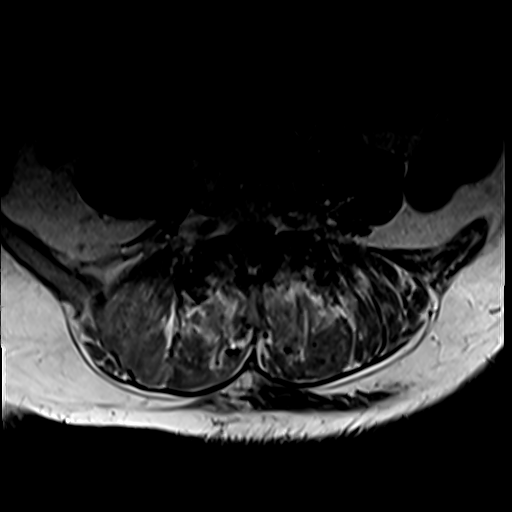
[im 25/40]
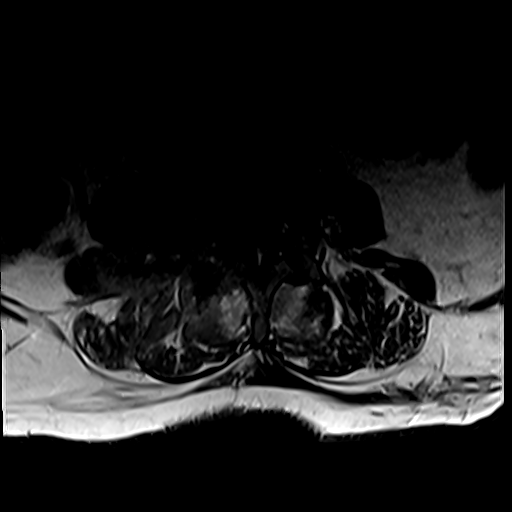

[Series 18: T1 fat-sat post-contrast · coronal · 4.5mm · 0.81mm/px · 4 of 20 slices shown (2 of 2)]
[im 1/20]
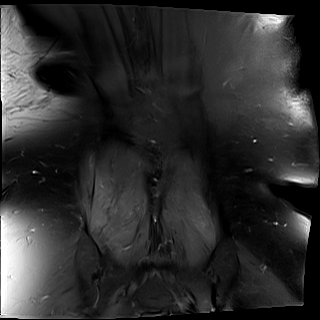
[im 7/20]
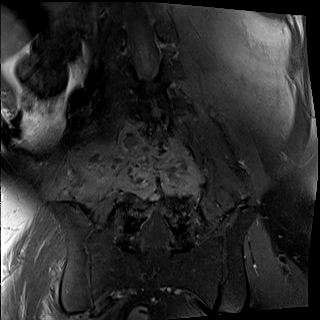
[im 13/20]
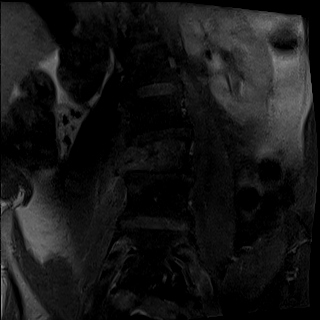
[im 20/20]
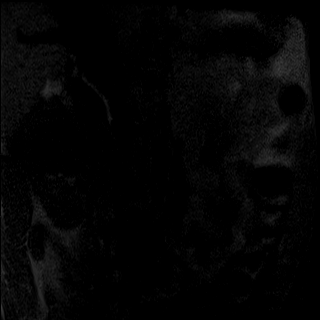

[26 of 48 positions shown; findings below may reference images not displayed]

FINDINGS: Segmentation: Standard. Lowest well-formed disc space labeled the
L5-S1 level.

Alignment:  Mild levoscoliosis.  4 mm anterolisthesis of L4 on L5.

Vertebrae: Large infiltrative and destructive metastatic lesion
involving the right greater than left posterior elements at L2
through L4 again seen. Although exact measurements are difficult,
mass measures approximately 6.1 x 10.6 x 7.0 cm in greatest
dimensions. Lesion involves and largely obliterates the right
greater than left posterior elements of L3 and L4. Additional
superior extension to involve the posterior elements of L2 noted as
well. Extraosseous extension into the adjacent right and posterior
paraspinous musculature. Epidural extension into the right greater
than left epidural space at the levels of L3 and L4, greatest at L3
where there is resultant severe spinal stenosis. Extension to
involve the right L2 and L3 neural foramina noted as well.
Involvement of the right greater than left L3 and L4 vertebral
bodies. Associated pathologic fracture at L3 with mild 25% central
height loss. No visible significant bony retropulsion.

Vertebral body height otherwise maintained. No other metastatic
implants within the lumbar spine.

Conus medullaris and cauda equina: Conus extends to the L1 level.
Conus medullaris within normal limits.

Paraspinal and other soft tissues: Paraspinous tumor extending from
L2 through L4 as above. Paraspinous soft tissues demonstrate no
other acute finding. Right kidney appears to be absent.

Disc levels:

L1-2:  Negative interspace.  Mild facet hypertrophy.  No stenosis.

L2-3: Disc desiccation with mild disc bulge, eccentric to the right.
Moderate bilateral facet hypertrophy. Infiltrative tumor partially
involves the right L2-3 facet and adjacent posterior paraspinous
musculature. Tumor extends to partially involve the right neural
foramen and epidural space. Resultant moderate spinal stenosis with
right greater than left lateral recess narrowing. Mild to moderate
right foraminal stenosis. Left neural foramina remains patent.

L3-4: Extensive tumor involvement involving the right pedicle and
posterior elements of L3, with partial involvement of the L3
vertebral body itself. Associated pathologic fracture with up to 25%
central height loss without significant bony retropulsion. Extensive
epidural involvement involving the right and posterior epidural
space (series 11, image 20). Resultant severe canal with bilateral
subarticular stenosis, right worse than left. Thecal sac is markedly
narrowed measuring approximately 4 mm in AP diameter at its most
narrow point. Tumor extends to involve the right neural foramen with
resultant moderate to severe right L3 foraminal stenosis. Mild to
moderate left foraminal narrowing noted as well related to disc
bulge and underlying facet disease.

L4-5: 4 mm anterolisthesis with mild disc bulge and disc
desiccation. Severe bilateral facet arthrosis. Tumor centered at the
L3 level extends inferiorly to partially involve the posterior
elements of L4. Small amount of epidural extension into the right
posterior epidural space at this level (series 2, image 11).
Resultant mild-to-moderate canal with bilateral subarticular
stenosis. Foramina remain patent.

L5-S1: Disc desiccation without significant disc bulge. Mild facet
hypertrophy. No canal or lateral recess stenosis. Foramina remain
patent.
IMPRESSION: 1. Large infiltrative and destructive metastatic lesion centered
about the right greater than left posterior elements of L2 through
L4 as above. Tumor extension into the epidural space at the L3 and
L4 levels, greatest at L3 where there is resultant severe spinal
stenosis and near complete effacement of the thecal sac. Emergent
neuro surgical and/or radiation oncology consultation recommended.
2. Additional tumor extension into the right L2 and L3 neural
foramina. Resultant mild right L2 foraminal stenosis, with moderate
to severe right L3 foraminal narrowing.
3. Associated pathologic fracture at L3 with up to 25% height loss
without significant bony retropulsion.

## 2021-07-22 IMAGING — CT CT CHEST W/O CM
2 of 3 series · 15 of 36 positions shown, 18 images · non-contrast
Comparison: Chest x-ray from earlier the same day, CT from
[DATE]

CLINICAL DATA: Shortness of breath



[Series 2: thorax · axial · 0.83mm/px · z∈[+1483,+1763]mm · 12 of 166 slices shown, 15 images]
[im 13/166  mediastinal]
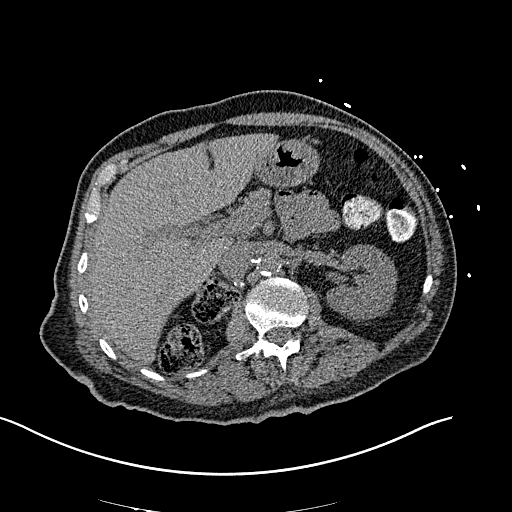
[im 13/166  lung]
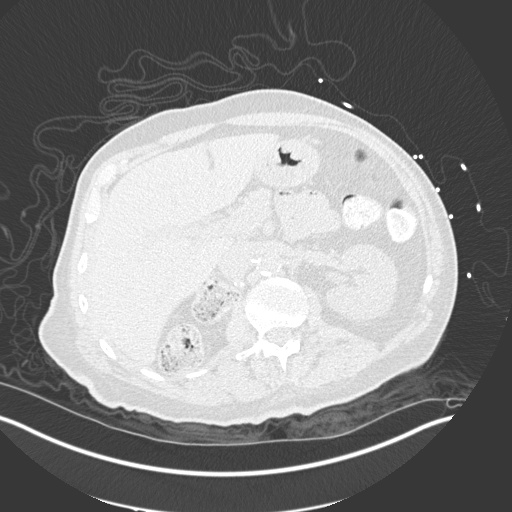
[im 25/166  lung]
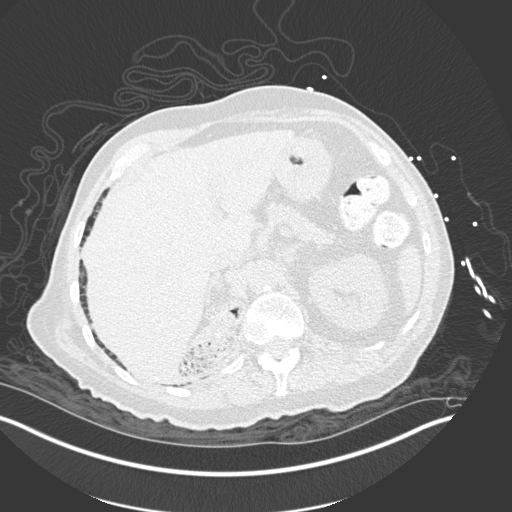
[im 37/166  lung]
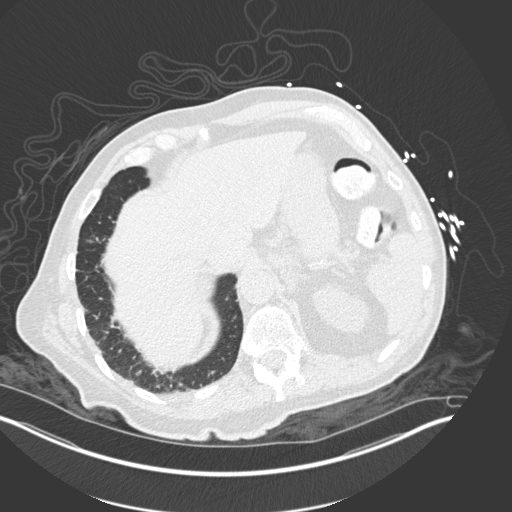
[im 49/166  lung]
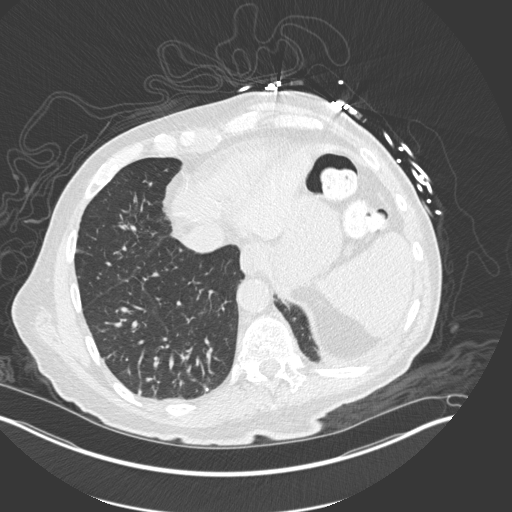
[im 62/166  mediastinal]
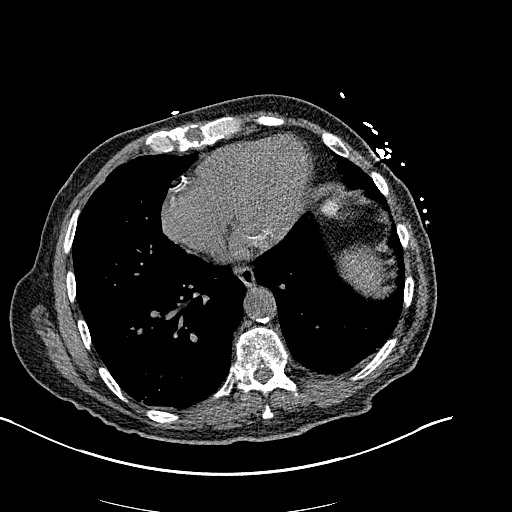
[im 62/166  lung]
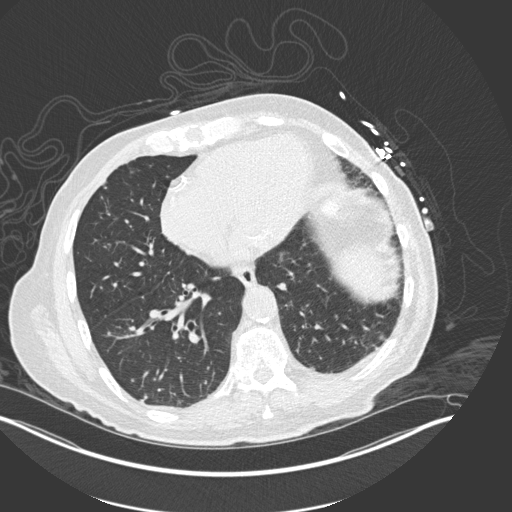
[im 74/166  lung]
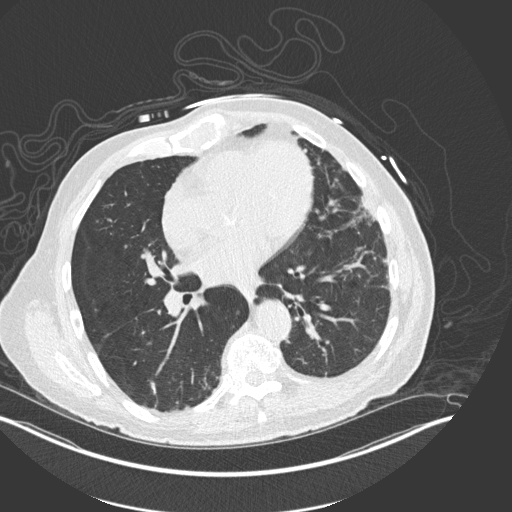
[im 92/166  lung]
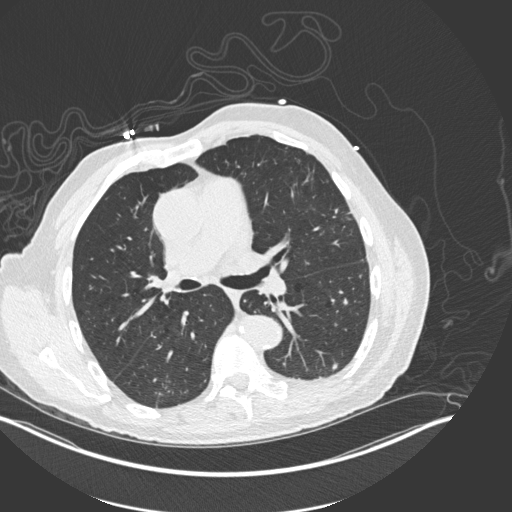
[im 104/166  lung]
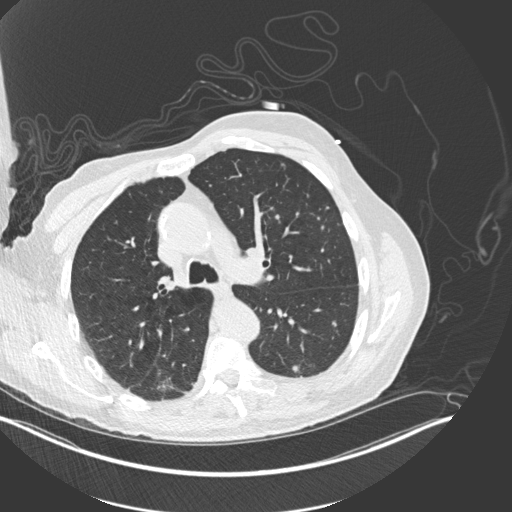
[im 117/166  mediastinal]
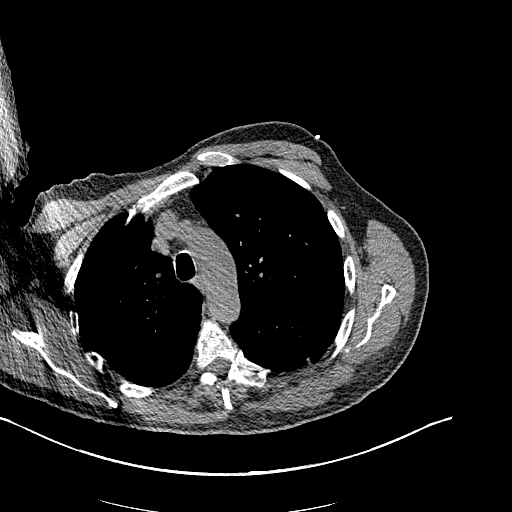
[im 117/166  lung]
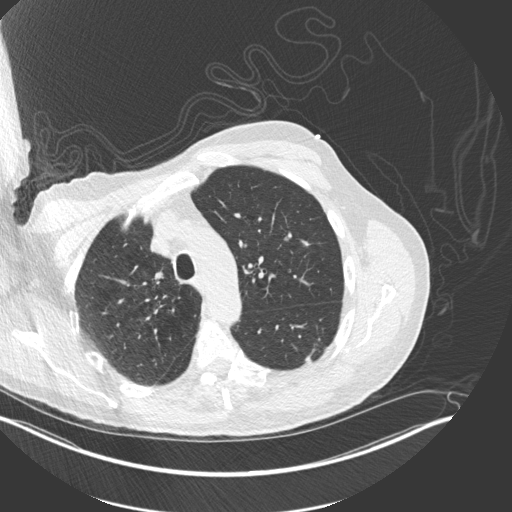
[im 129/166  lung]
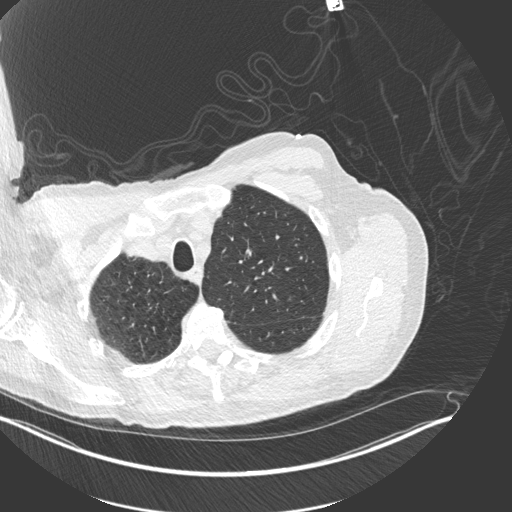
[im 141/166  lung]
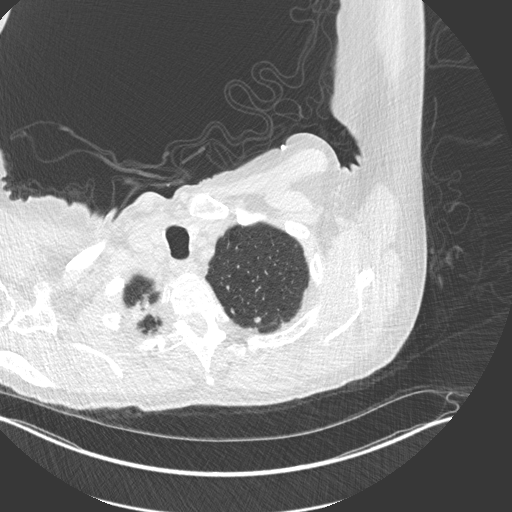
[im 153/166  lung]
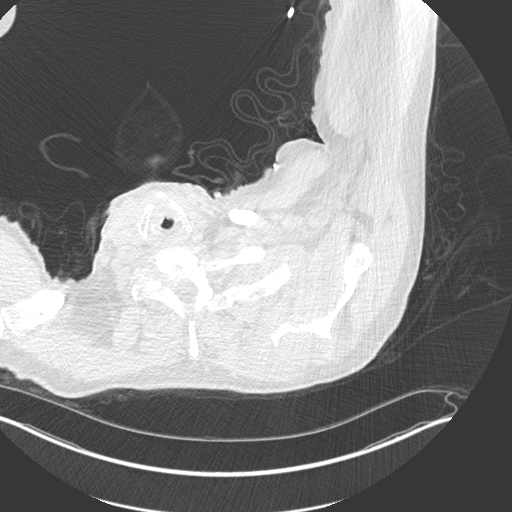

[Series 4: coronal · coronal · 0.78mm/px · 3 of 137 slices shown]
[im 28/137  lung]
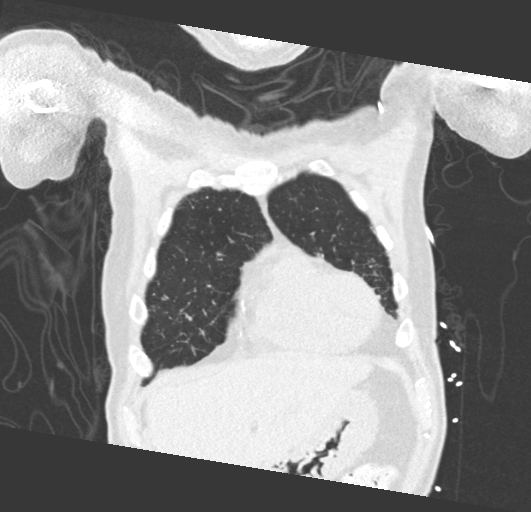
[im 55/137  lung]
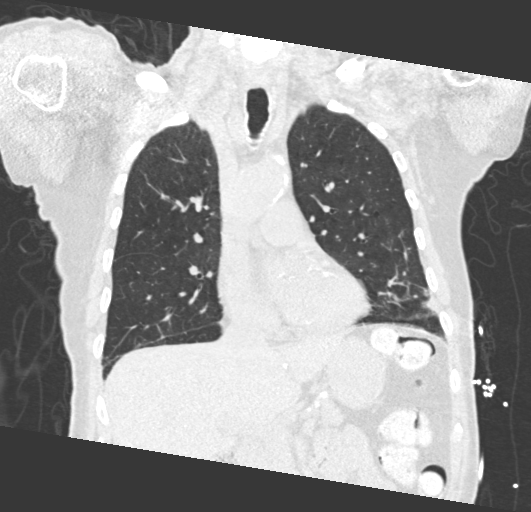
[im 82/137  lung]
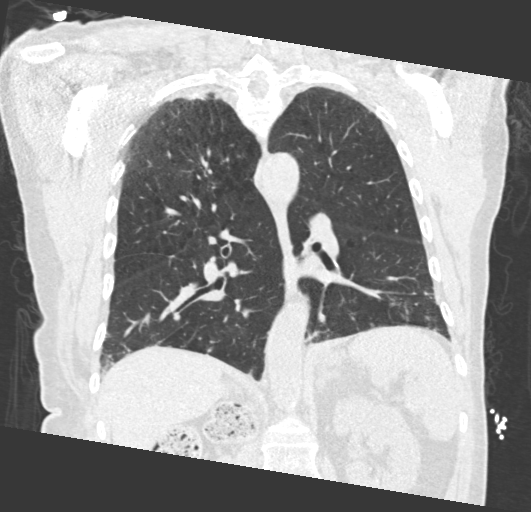

[15 of 36 positions shown; findings below may reference images not displayed]

FINDINGS: Cardiovascular: Somewhat limited due to lack of IV contrast.
Atherosclerotic calcifications are noted. No aneurysmal dilatation
is seen. Coronary calcifications are noted. No cardiac enlargement
is seen.

Mediastinum/Nodes: Thoracic inlet is within normal limits. No
sizable hilar or mediastinal adenopathy is noted. The esophagus as
visualized is within normal limits.

Lungs/Pleura: Lungs are well aerated bilaterally. Mild emphysematous
changes are seen. Scattered pulmonary nodules are noted throughout
both lungs. Largest of these is noted on image number 51 of series 7
measuring up to 7 mm. This shows some increase in size when compare
with the prior exam. The left apical nodule seen on prior study is
not as well visualized on today's study. No sizable effusion is
noted. Scarring is noted in the right apex stable from the prior CT
examination. No focal confluent infiltrate is noted.

Upper Abdomen: No acute abnormality. Changes of prior right
nephrectomy are noted.

Musculoskeletal: Degenerative changes of the thoracic spine are
noted. No acute rib abnormality is noted. Chronic mixed lytic and
sclerotic lesion in the T3 vertebral body posteriorly is noted.
There is an expansile lesion in the inferior aspect of the sternum
stable in appearance from the prior exam. Left scapular lytic lesion
is again noted and stable in appearance.
IMPRESSION: No focal infiltrate is identified.

Multiple bilateral pulmonary nodules are noted which have increased
in size from the prior exam consistent with mild progressive
metastatic disease.

Lytic lesions within the left scapula, sternum and T3 vertebral body
stable in appearance from the prior exam consistent with metastatic
disease.

Aortic Atherosclerosis ([U0]-[U0]) and Emphysema ([U0]-[U0]).

## 2021-07-22 MED ORDER — SENNOSIDES-DOCUSATE SODIUM 8.6-50 MG PO TABS: 1.0000 | ORAL_TABLET | Freq: Every evening | ORAL | Status: DC | PRN

## 2021-07-22 MED ORDER — LACTATED RINGERS IV SOLN: INTRAVENOUS | Status: DC

## 2021-07-22 MED ORDER — ACETAMINOPHEN 325 MG PO TABS: 650.0000 mg | ORAL_TABLET | Freq: Four times a day (QID) | ORAL | Status: DC | PRN

## 2021-07-22 MED ORDER — ONDANSETRON HCL 4 MG PO TABS: 4.0000 mg | ORAL_TABLET | Freq: Four times a day (QID) | ORAL | Status: DC | PRN

## 2021-07-22 MED ORDER — HYDROMORPHONE HCL 1 MG/ML IJ SOLN
1.0000 mg | Freq: Once | INTRAMUSCULAR | Status: AC
  Administered 2021-07-22: 1 mg via INTRAVENOUS
  Filled 2021-07-22: qty 1

## 2021-07-22 MED ORDER — GADOBUTROL 1 MMOL/ML IV SOLN
9.0000 mL | Freq: Once | INTRAVENOUS | Status: AC | PRN
  Administered 2021-07-22: 9 mL via INTRAVENOUS

## 2021-07-22 MED ORDER — ASPIRIN EC 81 MG PO TBEC
81.0000 mg | DELAYED_RELEASE_TABLET | Freq: Every day | ORAL | Status: DC
  Administered 2021-07-23 – 2021-07-26 (×3): 81 mg via ORAL
  Filled 2021-07-22 (×5): qty 1

## 2021-07-22 MED ORDER — ACETAMINOPHEN 650 MG RE SUPP: 650.0000 mg | Freq: Four times a day (QID) | RECTAL | Status: DC | PRN

## 2021-07-22 MED ORDER — HEPARIN BOLUS VIA INFUSION
4000.0000 [IU] | Freq: Once | INTRAVENOUS | Status: AC
  Administered 2021-07-22: 4000 [IU] via INTRAVENOUS
  Filled 2021-07-22: qty 4000

## 2021-07-22 MED ORDER — HEPARIN (PORCINE) 25000 UT/250ML-% IV SOLN
1650.0000 [IU]/h | INTRAVENOUS | Status: DC
  Administered 2021-07-22: 1200 [IU]/h via INTRAVENOUS
  Administered 2021-07-23: 1400 [IU]/h via INTRAVENOUS
  Administered 2021-07-24: 1650 [IU]/h via INTRAVENOUS
  Filled 2021-07-22 (×4): qty 250

## 2021-07-22 MED ORDER — ONDANSETRON HCL 4 MG/2ML IJ SOLN: 4.0000 mg | Freq: Four times a day (QID) | INTRAMUSCULAR | Status: DC | PRN

## 2021-07-22 NOTE — Telephone Encounter (Signed)
T/C from pt's wife, Hoyle Sauer, stating pt has a  change in status  He is having an increase in confusion, pain and urination.  He is immobile and she is going to take him to the ED for evaluation. Temp is 99.5 but his normal is 97.8

## 2021-07-22 NOTE — ED Provider Notes (Signed)
[facility] DEPT Provider Note   CSN: [id number] Arrival date & time: 07/08/2021  1408     History  Chief Complaint  Patient presents with   generalized weakness    James Massey is a 76 y.o. male.  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  Wife reports his weakness has progressed to the point he is unable to stand on his own strength.  Wife reports low-grade fever that occurred about 3 days ago with a temperature of 99.5.  Denies shortness of breath, cough, chest pain, abdominal pain.  Wife also reports patient's underlying pain is getting worse.  She does report patient fell on January 1 however wife assisted patient down gently and he was picked up off the floor by the neighbors.  She denies any trauma during that fall.  He has been ambulating fine since then with a walker.  States she spoke to primary care provider on Friday and had labs drawn which did not show any concerns however he progressed over the weekend and when they reached out to primary care provider today they asked him to present to the emergency room.  She also states he has been sleeping with food in his mouth.  The history is provided by the patient. No language interpreter was used.      Home Medications Prior to Admission medications   Medication Sig Start Date End Date Taking? Authorizing Provider  allopurinol (ZYLOPRIM) 300 MG tablet Take 300 mg by mouth at bedtime.     [provider]  atorvastatin (LIPITOR) 40 MG tablet Take 40 mg by mouth at bedtime.     [provider]  calcium-vitamin D (OSCAL WITH D) 500-200 MG-UNIT tablet Take 2 tablets by mouth 2 (two) times daily. 01/28/21   [name], MD  diazepam (VALIUM) 5 MG/ML solution Take 5 mg by mouth every 8 (eight) hours as needed  for anxiety.    [provider]  furosemide (LASIX) 20 MG tablet TAKE 1 TABLET(20 MG) BY MOUTH DAILY FOR 14 DAYS 06/20/21   [name], MD  hydrocortisone 2.5 % cream Apply 1 application topically 2 (two) times daily as needed (for hemorroidal flare ups).    [provider]  levothyroxine (SYNTHROID) 25 MCG tablet TAKE 1 TABLET(25 MCG) BY MOUTH DAILY BEFORE AND BREAKFAST 07/05/21   [name], MD  levothyroxine (SYNTHROID) 50 MCG tablet Take 1 tablet (50 mcg total) by mouth daily before breakfast. 01/16/21   Dessa Phi, DO  magnesium oxide (MAG-OX) 400 (240 Mg) MG tablet Take 1 tablet (400 mg total) by mouth 2 (two) times daily. Patient not taking: Reported on 02/05/2021 01/16/21   Dessa Phi, DO  Multiple Vitamin (MULTIVITAMIN) tablet Take 2 tablets by mouth daily.    [provider]  pantoprazole (PROTONIX) 40 MG tablet Take 40 mg by mouth 2 (two) times daily.    [provider]  prednisoLONE acetate (PRED FORTE) 1 % ophthalmic suspension Place 1 drop into the left eye 4 (four) times daily. 04/25/21   [provider]  Simethicone 180 MG CAPS Take 180 mg by mouth 3 (three) times daily as needed (for gas/indigestion.).    [provider]  traMADol (ULTRAM) 50 MG tablet TAKE 1 TABLET(50 MG) BY MOUTH EVERY 6 HOURS AS NEEDED 03/18/21   [name], MD  Allergies    Bee venom, Budesonide-formoterol fumarate, Colchicine, Poison oak extract, and Clindamycin/lincomycin    Review of Systems   Review of Systems  Constitutional:  Positive for chills and fever. Negative for activity change.  Eyes:  Negative for visual disturbance.  Respiratory:  Negative for cough and shortness of breath.   Gastrointestinal:  Negative for abdominal pain, constipation, nausea and vomiting.  Neurological:  Positive for weakness. Negative for light-headedness and headaches.  All other systems reviewed and are negative.  Physical Exam Updated  Vital Signs BP (!) 88/62    Pulse 78    Temp 98.3 F (36.8 C) (Oral)    Resp (!) 22    Ht 5\' 8"  (1.727 m)    Wt 81.6 kg    SpO2 98%    BMI 27.37 kg/m  Physical Exam Vitals and nursing note reviewed.  Constitutional:      General: He is not in acute distress.    Appearance: Normal appearance. He is not ill-appearing.  HENT:     Head: Normocephalic and atraumatic.     Nose: Nose normal.  Eyes:     General: No scleral icterus.    Extraocular Movements: Extraocular movements intact.     Conjunctiva/sclera: Conjunctivae normal.  Cardiovascular:     Rate and Rhythm: Normal rate and regular rhythm.     Pulses: Normal pulses.     Heart sounds: Normal heart sounds.  Pulmonary:     Effort: Pulmonary effort is normal. No respiratory distress.     Breath sounds: No wheezing.  Abdominal:     General: There is no distension.     Palpations: Abdomen is soft.     Tenderness: There is no abdominal tenderness. There is no right CVA tenderness, left CVA tenderness or guarding.  Musculoskeletal:        General: Normal range of motion.     Cervical back: Normal range of motion.     Right lower leg: Edema (Trace pitting edema on the right lower extremity.  Chronic per patient.) present.     Left lower leg: No edema.  Skin:    General: Skin is warm and dry.  Neurological:     General: No focal deficit present.     Mental Status: He is alert. Mental status is at baseline.    ED Results / Procedures / Treatments   Labs (all labs ordered are listed, but only abnormal results are displayed) Labs Reviewed  CBC WITH DIFFERENTIAL/PLATELET - Abnormal; Notable for the following components:      Result Value   WBC 11.0 (*)    RBC 3.63 (*)    Hemoglobin 11.6 (*)    HCT 34.5 (*)    Neutro Abs 8.8 (*)    Monocytes Absolute 1.1 (*)    All other components within normal limits  COMPREHENSIVE METABOLIC PANEL  URINALYSIS, ROUTINE W REFLEX MICROSCOPIC  RAPID URINE DRUG SCREEN, HOSP PERFORMED     EKG None  Radiology CT Head Wo Contrast  Result Date: 07/20/2021 CLINICAL DATA:  Mental status change, EXAM: CT HEAD WITHOUT CONTRAST TECHNIQUE: Contiguous axial images were obtained from the base of the skull through the vertex without intravenous contrast. RADIATION DOSE REDUCTION: This exam was performed according to the departmental dose-optimization program which includes automated exposure control, adjustment of the mA and/or kV according to patient size and/or use of iterative reconstruction technique. COMPARISON:  None. FINDINGS: Brain: No acute intracranial hemorrhage. No focal mass lesion. No CT evidence of acute infarction.  No midline shift or mass effect. No hydrocephalus. Basilar cisterns are patent. There are periventricular and subcortical white matter hypodensities. Generalized cortical atrophy. Vascular: No hyperdense vessel or unexpected calcification. Skull: Normal. Negative for fracture or focal lesion. Sinuses/Orbits: Paranasal sinuses and mastoid air cells are clear. Orbits are clear. Other: None. IMPRESSION: 1. No acute intracranial findings. 2. Chronic atrophy and white matter microvascular disease. Electronically Signed   By: [name] M.D.   On: 07/15/2021 15:52    Procedures Procedures    Medications Ordered in ED Medications - No data to display  ED Course/ Medical Decision Making/ A&P Clinical Course as of 07/28/2021 1701  Mon Jul 22, 2021  1657 BUN: 23 [AA]    Clinical Course User Index [AA] Evlyn Courier, PA-C                           Medical Decision Making  Medical Decision Making / ED Course   This patient presents to the ED for concern of progressive weakness, confusion, urinary frequency over the past week, this involves an extensive number of treatment options, and is a complaint that carries with it a high risk of complications and morbidity.  The differential diagnosis includes pneumonia, UTI, other infection, metabolic  encephalopathy  MDM: 76 year old male presents today for evaluation of progressive weakness, confusion, urinary frequency over the past week.  Wife reports weakness has progressed to the point that patient is unable to stand without assistance.  Patient was able to stand and transition from wheelchair to bed prior to last week without issue.  Wife reports patient is also confused and somnolent and is falling asleep while eating occasionally.  He denies any recent illnesses, cough, shortness of breath, chest pain.  EKG showed new T wave inversions in the inferior leads.  Troponin was obtained which was elevated at 146.  Discussion had with cardiology who recommend admission to medicine and they will follow.  Chest x-ray shows viral pneumonitis but without definite pneumonia.  Mild leukocytosis present on   Additional history obtained: -Additional history obtained from wife at bedside -External records from outside source obtained and reviewed including: Chart review including previous notes, labs, imaging, consultation notes   Lab Tests: -I ordered, reviewed, and interpreted labs.   The pertinent results include:   Labs Reviewed  CBC WITH DIFFERENTIAL/PLATELET - Abnormal; Notable for the following components:      Result Value   WBC 11.0 (*)    RBC 3.63 (*)    Hemoglobin 11.6 (*)    HCT 34.5 (*)    Neutro Abs 8.8 (*)    Monocytes Absolute 1.1 (*)    All other components within normal limits  COMPREHENSIVE METABOLIC PANEL  URINALYSIS, ROUTINE W REFLEX MICROSCOPIC  RAPID URINE DRUG SCREEN, HOSP PERFORMED      EKG  EKG Interpretation  Date/Time:    Ventricular Rate:    PR Interval:    QRS Duration:   QT Interval:    QTC Calculation:   R Axis:     Text Interpretation:           Imaging Studies ordered: I ordered imaging studies including chest x-ray, CT chest without contrast I independently visualized and interpreted imaging. I agree with the radiologist  interpretation   Medicines ordered and prescription drug management: No orders of the defined types were placed in this encounter.   -I have reviewed the patients home medicines and have made adjustments as needed  Consultations Obtained: I requested consultation with the cardiology,  and discussed lab and imaging findings as well as pertinent plan - they recommend: Admission for NSTEMI to medicine service.   Cardiac Monitoring: The patient was maintained on a cardiac monitor.  I personally viewed and interpreted the cardiac monitored which showed an underlying rhythm of: Normal sinus rhythm  Reevaluation: After the interventions noted above, I reevaluated the patient and found that they have :stayed the same  Co morbidities that complicate the patient evaluation  Past Medical History:  Diagnosis Date   Anxiety    Barrett esophagus    GERD (gastroesophageal reflux disease)    Gout    Heart murmur    History of anemia    Kidney cancer, primary, 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 to other site [facility])    Mixed hyperlipidemia    Renal cell cancer, right ([place])    Right renal mass    Wears glasses       Dispostion: Discussed with hospitalist who will evaluate patient for admission.  BNP and CT chest without contrast added at the request of hospitalist.  Final Clinical Impression(s) / ED Diagnoses Final diagnoses:  Weakness  NSTEMI (non-ST elevated myocardial infarction) [facility])    Rx / [facility] Orders ED Discharge Orders     None         Evlyn Courier, PA-C 16/10/96 0454    Lianne Cure, DO 09/81/19 2352

## 2021-07-22 NOTE — H&P (Signed)
History and Physical    James Massey HEN:[id number] DOB: 04-Oct-1945 DOA: 07/13/2021  PCP: Myrtis Hopping., MD   Patient coming from: Home  Chief Complaint: Confusion, weakness  HPI: [name] is a 76 y.o. 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  Weakness started on New Year's Day and is progressively gotten worse over the last few weeks.  He has a right foot drop and he is now not able to get up out of a chair or out of bed or into his wheelchair without assistance wife or CNA.  He is also had intermittent periods of confusion over the last 4 to 5 days.  Wife reports he has a low-grade fever of 99.5 at home intermittently for the last few days.  He has used Tylenol for the fever she states.  He did have a fall on January 1 in which he is got very weak and was lowered to the ground and did not hit his head and he had no loss of consciousness.  He has not had any seizure activity.  Prior to January 1 he was walking with a aid of a walker.  He is now to the point where he is unable to care for himself due to his generalized weakness.  He also has increase in his low back pain.  He has known metastatic lesion to L3 that was seen on CT scan in December.  MRI was not obtained at that point.  Wife reports he has been having to get up every hour and a half at night to urinate but he only has a small amount of urine each time.  Patient reports he does have some mild burning when he urinates.  Wife states he is not urinating at all during the day.  Wife reports he will fall asleep while he is chewing and eating his food but he has not aspirated.  States he used to take in 2030 minutes to eat a meal but now is taking an hour and a half due to his weakness and severe tiredness.  ED Course: Mr. Glade Lloyd had a blood pressure of  84-120/57-84 in the emergency room.  He has been afebrile.  He has been alert and heart rate 80-90 with respirations 13-23.  He of the head was negative for acute intracranial process or hemorrhage.  Urinalysis is negative.  COVID swab negative.  Influenza A and B are negative.  Found to have a non-STEMI with new inverted T waves in the inferior leads on EKG and initial troponin of 146.  Second troponin increased to 174.  ER PA discussed with cardiology who will follow patient in the morning in consultation.  I have started patient on heparin infusion for NSTEMI.  Chest x-ray shows possible basilar superimposed pneumonia.  CT of the chest is ordered for further evaluation.  Lab work reveals WBC 11,000 hemoglobin 11.6 hematocrit 34.5 platelets 275,000 sodium 132 potassium 4.4 chloride 96 bicarb 30 creatinine 1.58 which is baseline BUN 23 glucose 104 LFTs normal albumin 3.0.  Hospitalist services been asked admit for further management  Review of Systems:  General: Reports generalized weakness and unable to walk, transfer self. Reports subjective fever, chills. Denies weight loss, night sweats. Denies dizziness. Denies change in appetite HENT: Denies head trauma, headache, denies tinnitus. Denies nasal congestion or bleeding. Denies sore throat.  Denies difficulty swallowing  Eyes: Denies blurry vision, pain in eye, drainage. Denies discoloration of eyes. Neck: Denies pain.  Denies swelling.  Denies pain with movement. Cardiovascular: Denies chest pain, palpitations. Has chronic edema right ankle.  Denies orthopnea Respiratory: Reports shortness of breath with exertion. Denies cough.  Denies wheezing.  Gastrointestinal: Denies abdominal pain, swelling. Denies nausea, vomiting, diarrhea. Denies melena.  Denies hematemesis. Musculoskeletal: Reports right foot drop, reports limitation of movement of legs due to weakness.  Denies deformity. Reports chronic back pain Genitourinary: Reports dysuria and urinary  frequency. Denies pelvic pain.  Denies urinary hesitancy.   Skin: Denies rash.  Denies petechiae, purpura, ecchymosis. Neurological: Denies syncope.  Denies seizure activity. Denies slurred speech, drooping face.  Denies visual change. Psychiatric: Denies depression, anxiety.  Denies hallucinations.  Past Medical History:  Diagnosis Date   Anxiety    Barrett esophagus    GERD (gastroesophageal reflux disease)    Gout    Heart murmur    History of anemia    Kidney cancer, primary, 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 to other site James Massey)    Mixed hyperlipidemia    Renal cell cancer, right ([place])    Right renal mass    Wears glasses     Past Surgical History:  Procedure Laterality Date   CYSTOSCOPY WITH RETROGRADE PYELOGRAM, URETEROSCOPY AND STENT PLACEMENT Right 05/21/2018   Procedure: RIGHT RETROGRADE PYELOGRAM, RIGHT DIAGNOSTIC URETEROSCOPY AND STENT PLACEMENT;  Surgeon: Ardis Hughs, MD;  Location: [facility];  Service: Urology;  Laterality: Right;   INGUINAL HERNIA REPAIR Bilateral 09/2012   LAPAROSCOPIC NEPHRECTOMY, HAND ASSISTED Right 05/31/2018   Procedure: LAPAROSCOPIC RADICAL RIGHT NEPHRECTOMY;  Surgeon: Ardis Hughs, MD;  Location: [facility];  Service: Urology;  Laterality: Right;    Social History  reports that he quit smoking about 33 years ago. His smoking use included cigarettes. He has a 7.50 pack-year smoking history. He has never used smokeless tobacco. He reports current alcohol use. He reports that he does not use drugs.  Allergies  Allergen Reactions   Bee Venom Swelling   Budesonide-Formoterol Fumarate Other (See Comments)    RESPIRATORY ISSUES   Colchicine Nausea And Vomiting   Poison [employer] and ivy   Clindamycin/Lincomycin Rash    Family History  Problem Relation Age of Onset   Breast cancer Mother    Heart attack Father    Prostate cancer Neg Hx    Pancreatic cancer Neg Hx    Colon cancer Neg Hx      Prior to Admission  medications   Medication Sig Start Date End Date Taking? Authorizing Provider  allopurinol (ZYLOPRIM) 300 MG tablet Take 300 mg by mouth at bedtime.    Yes [provider]  atorvastatin (LIPITOR) 40 MG tablet Take 40 mg by mouth at bedtime.    Yes [provider]  calcium-vitamin D (OSCAL WITH D) 500-200 MG-UNIT tablet Take 2 tablets by mouth 2 (two) times daily. 01/28/21  Yes [name], MD  diazepam (VALIUM) 5 MG tablet Take 5 mg by mouth every 8 (eight) hours as needed for anxiety.   Yes [provider]  furosemide (LASIX) 20 MG tablet TAKE 1 TABLET(20 MG) BY MOUTH DAILY FOR 14 DAYS Patient taking differently: Take 20 mg by mouth daily as needed for fluid or edema. 06/20/21  Yes [name], MD  hydrocortisone 2.5 % cream Apply 1 application topically 2 (two) times daily as needed (for hemorroidal flare ups).   Yes  [provider]  levothyroxine (SYNTHROID) 50 MCG tablet Take 1 tablet (50 mcg total) by mouth daily before breakfast. 01/16/21  Yes Dessa Phi, DO  magnesium oxide (MAG-OX) 400 (240 Mg) MG tablet Take 1 tablet (400 mg total) by mouth 2 (two) times daily. 01/16/21  Yes Dessa Phi, DO  Multiple Vitamin (MULTIVITAMIN) tablet Take 2 tablets by mouth daily.   Yes [provider]  pantoprazole (PROTONIX) 40 MG tablet Take 40 mg by mouth 2 (two) times daily.   Yes [provider]  Simethicone 180 MG CAPS Take 180 mg by mouth 3 (three) times daily as needed (for gas/indigestion.).   Yes [provider]  traMADol (ULTRAM) 50 MG tablet TAKE 1 TABLET(50 MG) BY MOUTH EVERY 6 HOURS AS NEEDED Patient taking differently: Take 50 mg by mouth every 6 (six) hours as needed for severe pain or moderate pain. 03/18/21  Yes [name], MD  levothyroxine (SYNTHROID) 25 MCG tablet TAKE 1 TABLET(25 MCG) BY MOUTH DAILY BEFORE AND BREAKFAST Patient not taking: Reported on 07/17/2021 07/05/21   [name], MD    Physical  Exam: Vitals:   08/04/2021 1730 07/27/2021 1745 07/21/2021 1800 07/19/2021 1842  BP: 98/74 105/84 107/61 (!) 88/70  Pulse: 88 90 89 88  Resp: 19 18 13 17   Temp:    98.3 F (36.8 C)  TempSrc:    Oral  SpO2: 98% 100% 99% 96%  Weight:      Height:        Constitutional: NAD, calm, comfortable Vitals:   07/11/2021 1730 08/06/2021 1745 07/25/2021 1800 07/28/2021 1842  BP: 98/74 105/84 107/61 (!) 88/70  Pulse: 88 90 89 88  Resp: 19 18 13 17   Temp:    98.3 F (36.8 C)  TempSrc:    Oral  SpO2: 98% 100% 99% 96%  Weight:      Height:       General: WDWN, Alert and oriented to person and place.  Eyes: EOMI, PERRL, conjunctivae normal.  Sclera nonicteric HENT:  [place]/AT, external ears normal.  Nares patent without epistasis.  Mucous membranes are dry. Posterior pharynx clear of any exudate   Neck: Soft, normal range of motion, supple, no masses, Trachea midline Respiratory: Equal breath sounds with mild bibasilar rales, no wheezing, no crackles. Normal respiratory effort. No accessory muscle use.  Cardiovascular: Regular rate and rhythm, Has 2/6 systolic murmur. No rubs / gallops. Trace right lower extremity edema. 2+ pedal pulses.  Abdomen: Soft, no tenderness, nondistended, no rebound or guarding.  No masses palpated. Bowel sounds normoactive Musculoskeletal: FROM. no cyanosis. No joint deformity upper and lower extremities. Normal muscle tone.  Skin: Warm, dry, intact no rashes, lesions, ulcers. No induration Neurologic: CN 2-12 grossly intact. Normal speech. Sensation intact to touch. Grip strength 4/5 bilaterally. Lower extremity strength 2/5 on right; 3/5 on left. No tremor.    Psychiatric: Normal mood.    Labs on Admission: I have personally reviewed following labs and imaging studies  CBC: Recent Labs  Lab 07/27/2021 1531  WBC 11.0*  NEUTROABS 8.8*  HGB 11.6*  HCT 34.5*  MCV 95.0  PLT 786    Basic Metabolic Panel: Recent Labs  Lab 07/12/2021 1531  NA 132*  K 4.4  CL 96*  CO2 30   GLUCOSE 104*  BUN 23  CREATININE 1.58*  CALCIUM 11.3*    GFR: Estimated Creatinine Clearance: 39.1 mL/min (A) (by C-G formula based on SCr of 1.58 mg/dL (H)).  Liver Function Tests: Recent  Labs  Lab 07/09/2021 1531  AST 34  ALT 42  ALKPHOS 89  BILITOT 0.7  PROT 6.5  ALBUMIN 3.0*    Urine analysis:    Component Value Date/Time   COLORURINE YELLOW 07/12/2021 1640   APPEARANCEUR CLEAR 08/02/2021 1640   LABSPEC 1.015 07/09/2021 1640   PHURINE 5.0 07/10/2021 1640   GLUCOSEU NEGATIVE 07/18/2021 1640   HGBUR NEGATIVE 07/30/2021 1640   BILIRUBINUR NEGATIVE 08/02/2021 1640   KETONESUR NEGATIVE 07/28/2021 1640   PROTEINUR NEGATIVE 07/14/2021 1640   NITRITE NEGATIVE 07/25/2021 1640   LEUKOCYTESUR NEGATIVE 08/06/2021 1640    Radiological Exams on Admission: CT Head Wo Contrast  Result Date: 07/27/2021 CLINICAL DATA:  Mental status change, EXAM: CT HEAD WITHOUT CONTRAST TECHNIQUE: Contiguous axial images were obtained from the base of the skull through the vertex without intravenous contrast. RADIATION DOSE REDUCTION: This exam was performed according to the departmental dose-optimization program which includes automated exposure control, adjustment of the mA and/or kV according to patient size and/or use of iterative reconstruction technique. COMPARISON:  None. FINDINGS: Brain: No acute intracranial hemorrhage. No focal mass lesion. No CT evidence of acute infarction. No midline shift or mass effect. No hydrocephalus. Basilar cisterns are patent. There are periventricular and subcortical white matter hypodensities. Generalized cortical atrophy. Vascular: No hyperdense vessel or unexpected calcification. Skull: Normal. Negative for fracture or focal lesion. Sinuses/Orbits: Paranasal sinuses and mastoid air cells are clear. Orbits are clear. Other: None. IMPRESSION: 1. No acute intracranial findings. 2. Chronic atrophy and white matter microvascular disease. Electronically Signed   By:  [name] M.D.   On: 07/16/2021 15:52   DG Chest Portable 1 View  Result Date: 07/21/2021 CLINICAL DATA:  Evaluate for pneumonia EXAM: PORTABLE CHEST 1 VIEW COMPARISON:  March 15, 2020 FINDINGS: The heart size and mediastinal contours are within normal limits. Mild increased pulmonary interstitium is identified bilaterally. Mild hazy ground-glass opacities identified in the right lung base. There is no pleural effusion. The visualized skeletal structures are unremarkable. IMPRESSION: Mild increased pulmonary interstitium identified bilaterally, this can be seen in mild edema or viral pneumonitis. Mild hazy ground-glass opacities identified in the right lung base, superimposed pneumonia is not excluded. Electronically Signed   By: Abelardo Diesel M.D.   On: 07/10/2021 16:57    EKG: Independently reviewed.  EKG shows normal sinus rhythm with occasional PAC.  Patient has inverted T waves in the inferior leads which are new.  No acute ST elevation or depression.  QTc  412  Assessment/Plan Principal Problem:   NSTEMI (non-ST elevated myocardial infarction)  Mr. Armor is admitted to Telemetry floor.  Serial troponin levels will be monitored overnight. Second troponin increased to 176. Heparin per pharmacy for NSTEMI Cardiology consulted by ER PA and will see pt in am Check Echocardiogram in am  Active Problems:   Altered mental status CT head negative for acute intracranial process. Has been having intermittent confusion for the past few days per wife. Pt oriented to himself and place currently.  Chest x-ray cannot rule out superimposed pneumonia and base of lung which could be contributing to confusion if he has acute infection.  CT of chest without contrast ordered to better evaluate and if pneumonia present will start antibiotic therapy    Generalized weakness Has 2 week history of worsening weakness to the point he is unable to get up or ambulate on his own. His wife has to lift him into  and out of bed and into/out of wheelchair.  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 Concern that mass causing more impingement of spinal cord causing the weakness in his legs Obtain MRI of lumbar spine to better evaluate.  When MRI results are known may need neurosurgical input.    CKD (chronic kidney disease) stage 3, GFR 30-59 ml/min  Stable.  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  Was scheduled to have radiation therapy to L-spine this week for known metastatic lesion    Foot drop, right Started 2 weeks ago and has persisted with increasing weakness in his legs.  MRI of L-spine ordered for further evaluation with known metastatic disease  DVT prophylaxis: Heparin infusion with NSTEMI  Code Status:   Full Code  Family Communication:  Diagnosis and plan discussed with patient and his wife who is at bedside.  Questions answered.  They verbalized understanding and agree with plan.  Further recommendations to follow as clinical indicated Disposition Plan:   Patient is from:  Home  Anticipated DC to:  Home  Anticipated DC date:  Anticipate 2 midnight or more stay  Time Spent:   75 minutes   Consults called:  Cardiology consulted by ER and will follow.  Admission status:  Inpatient  Yevonne Aline Rahmah Mccamy MD [facility]  How to contact the [facility] Attending or Consulting provider [place] or covering provider during after hours [place], for this patient?   Check the care team in [facility] and look for a) attending/consulting [facility] provider listed and b) the [facility] team listed Log into [email or web address] and use [place]'s universal password to access. If you do not have the password, please contact the hospital operator. Locate the [facility] provider you are looking for under [facility] and page to a number that you can be directly reached. If you still have difficulty  reaching the provider, please page the [facility] (Director on Call) for the Hospitalists listed on amion for assistance.  07/14/2021, 7:49 PM

## 2021-07-22 NOTE — ED Notes (Signed)
Trop 174 - Dr. Tonie Griffith informed via epic secure chat.

## 2021-07-22 NOTE — ED Triage Notes (Signed)
PT BIBA from home.  EMS states over past week pt reports progressively worsening of generalized weakness and intermittent confusion and increased frequency of urination. Pt A& O Hx of renal cancer   350 ML 102/66 HR 90 RR 16 97% room air CBG 116 20 G L forearm.

## 2021-07-22 NOTE — Progress Notes (Signed)
James Massey for IV heparin Indication: chest pain/ACS  Allergies  Allergen Reactions   Bee Venom Swelling   Budesonide-Formoterol Fumarate Other (See Comments)    RESPIRATORY ISSUES   Colchicine Nausea And Vomiting   Poison [employer] and ivy   Clindamycin/Lincomycin Rash    Patient Measurements: Height: 5\' 8"  (172.7 cm) Weight: 81.6 kg (180 lb) IBW/kg (Calculated) : 68.4 Heparin Dosing Weight: TBW  Vital Signs: Temp: 98.3 F (36.8 C) (01/16 1842) Temp Source: Oral (01/16 1842) BP: 88/70 (01/16 1842) Pulse Rate: 88 (01/16 1842)  Labs: Recent Labs    08/01/2021 1531 07/20/2021 1641 07/12/2021 1855  HGB 11.6*  --   --   HCT 34.5*  --   --   PLT 275  --   --   CREATININE 1.58*  --   --   TROPONINIHS  --  146* 174*    Estimated Creatinine Clearance: 39.1 mL/min (A) (by C-G formula based on SCr of 1.58 mg/dL (H)).   Medical History: Past Medical History:  Diagnosis Date   Anxiety    Barrett esophagus    GERD (gastroesophageal reflux disease)    Gout    Heart murmur    History of anemia    Kidney cancer, primary, 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 to other site [facility])    Mixed hyperlipidemia    Renal cell cancer, right ([place])    Right renal mass    Wears glasses     Medications:  (Not in a hospital admission)  Scheduled:   aspirin EC  81 mg Oral Daily   heparin  4,000 Units Intravenous Once   PRN: acetaminophen **OR** acetaminophen, ondansetron **OR** ondansetron (ZOFRAN) IV, senna-docusate  Assessment: 55 yoM with PMH metastatic RCC s/p nephrectomy in 2019, presents with progressive weakness, AMS, and urinary frequency. Troponins noted to be elevated. Cards consulted; Pharmacy to dose IV heparin.  Baseline INR, aPTT: not done Prior anticoagulation: none  Significant events:  Today, 07/07/2021: CBC: hgb slightly low; Plt WNL SCr elevated (~1 in July last year, but since then seems to consistently be  1.4-1.7) No bleeding or infusion issues per nursing  Goal of Therapy: Heparin level 0.3-0.7 units/ml Monitor platelets by anticoagulation protocol: Yes  Plan: Heparin 4000 units IV bolus x 1 Heparin 1000 units/hr IV infusion Check heparin level 8 hrs after start Daily CBC, daily heparin level once stable Monitor for signs of bleeding or thrombosis  Reuel Boom, PharmD, BCPS [phone] 07/09/2021, 8:18 PM

## 2021-07-22 NOTE — Telephone Encounter (Signed)
James Massey wife called requesting to cancel his radiation treatment appointment this afternoon.  Mr. Carel hasn't been feeling well over the weekend and Mrs. Patmon expressed it would be better to start treatment on Wednesday.  At this time she reports that she left message with Dr. Hazeline Junker office and will most likely be taking James Massey to the emergency room for care this afternoon.  Dr. Tammi Klippel has been notified and [place] as well to cancel this afternoon's appointment.

## 2021-07-23 ENCOUNTER — Ambulatory Visit: Payer: Medicare Other | Admitting: Radiation Oncology

## 2021-07-23 ENCOUNTER — Ambulatory Visit
Admission: RE | Admit: 2021-07-23 | Discharge: 2021-07-23 | Disposition: A | Discharge status: Home / Self Care | Payer: Medicare Other | Source: Ambulatory Visit | Attending: Radiation Oncology | Admitting: Radiation Oncology

## 2021-07-23 ENCOUNTER — Ambulatory Visit: Payer: Medicare Other

## 2021-07-23 ENCOUNTER — Telehealth: Payer: Self-pay | Admitting: *Deleted

## 2021-07-23 ENCOUNTER — Inpatient Hospital Stay (HOSPITAL_COMMUNITY): Payer: Medicare Other

## 2021-07-23 DIAGNOSIS — I214 Non-ST elevation (NSTEMI) myocardial infarction: Secondary | ICD-10-CM

## 2021-07-23 LAB — CBC
HCT: 32.2 % — ABNORMAL LOW (ref 39.0–52.0)
Hemoglobin: 10.7 g/dL — ABNORMAL LOW (ref 13.0–17.0)
MCH: 31.5 pg (ref 26.0–34.0)
MCHC: 33.2 g/dL (ref 30.0–36.0)
MCV: 94.7 fL (ref 80.0–100.0)
Platelets: 224 10*3/uL (ref 150–400)
RBC: 3.4 MIL/uL — ABNORMAL LOW (ref 4.22–5.81)
RDW: 13.2 % (ref 11.5–15.5)
WBC: 7.1 10*3/uL (ref 4.0–10.5)
nRBC: 0 % (ref 0.0–0.2)

## 2021-07-23 LAB — HEPARIN LEVEL (UNFRACTIONATED)
Heparin Unfractionated: 0.12 IU/mL — ABNORMAL LOW (ref 0.30–0.70)
Heparin Unfractionated: 0.18 IU/mL — ABNORMAL LOW (ref 0.30–0.70)

## 2021-07-23 LAB — BASIC METABOLIC PANEL
Anion gap: 7 (ref 5–15)
BUN: 20 mg/dL (ref 8–23)
CO2: 27 mmol/L (ref 22–32)
Calcium: 11.1 mg/dL — ABNORMAL HIGH (ref 8.9–10.3)
Chloride: 97 mmol/L — ABNORMAL LOW (ref 98–111)
Creatinine, Ser: 1.4 mg/dL — ABNORMAL HIGH (ref 0.61–1.24)
GFR, Estimated: 52 mL/min — ABNORMAL LOW (ref >60)
Glucose, Bld: 110 mg/dL — ABNORMAL HIGH (ref 70–99)
Potassium: 3.8 mmol/L (ref 3.5–5.1)
Sodium: 131 mmol/L — ABNORMAL LOW (ref 135–145)

## 2021-07-23 LAB — ECHOCARDIOGRAM COMPLETE
AR max vel: 1.68 cm2
AV Area VTI: 1.78 cm2
AV Area mean vel: 1.63 cm2
AV Mean grad: 15.7 mmHg
AV Peak grad: 29.5 mmHg
Ao pk vel: 2.71 m/s
Area-P 1/2: 3.91 cm2
Height: 68 in
S' Lateral: 2.1 cm
Single Plane A4C EF: 56.9 %
Weight: 2880 oz

## 2021-07-23 LAB — TROPONIN I (HIGH SENSITIVITY): Troponin I (High Sensitivity): 83 ng/L — ABNORMAL HIGH (ref <18)

## 2021-07-23 LAB — PROTIME-INR
INR: 1.1 (ref 0.8–1.2)
Prothrombin Time: 13.9 seconds (ref 11.4–15.2)

## 2021-07-23 MED ORDER — ATORVASTATIN CALCIUM 40 MG PO TABS
40.0000 mg | ORAL_TABLET | Freq: Every day | ORAL | Status: DC
  Administered 2021-07-23 – 2021-07-25 (×4): 40 mg via ORAL
  Filled 2021-07-23 (×5): qty 1

## 2021-07-23 MED ORDER — HEPARIN BOLUS VIA INFUSION
2000.0000 [IU] | Freq: Once | INTRAVENOUS | Status: AC
  Administered 2021-07-23: 2000 [IU] via INTRAVENOUS
  Filled 2021-07-23: qty 2000

## 2021-07-23 MED ORDER — ENSURE ENLIVE PO LIQD
237.0000 mL | Freq: Two times a day (BID) | ORAL | Status: DC
  Administered 2021-07-23 (×2): 237 mL via ORAL

## 2021-07-23 MED ORDER — MAGNESIUM OXIDE -MG SUPPLEMENT 400 (240 MG) MG PO TABS
400.0000 mg | ORAL_TABLET | Freq: Two times a day (BID) | ORAL | Status: DC
  Administered 2021-07-23 – 2021-07-26 (×8): 400 mg via ORAL
  Filled 2021-07-23 (×9): qty 1

## 2021-07-23 MED ORDER — OYSTER SHELL CALCIUM/D3 500-5 MG-MCG PO TABS
2.0000 | ORAL_TABLET | Freq: Two times a day (BID) | ORAL | Status: DC
  Administered 2021-07-23 – 2021-07-26 (×7): 2 via ORAL
  Filled 2021-07-23 (×7): qty 2

## 2021-07-23 MED ORDER — TRAMADOL HCL 50 MG PO TABS
50.0000 mg | ORAL_TABLET | Freq: Four times a day (QID) | ORAL | Status: DC | PRN
  Administered 2021-07-23 – 2021-07-26 (×8): 50 mg via ORAL
  Filled 2021-07-23 (×8): qty 1

## 2021-07-23 MED ORDER — PANTOPRAZOLE SODIUM 40 MG PO TBEC
40.0000 mg | DELAYED_RELEASE_TABLET | Freq: Two times a day (BID) | ORAL | Status: DC
  Administered 2021-07-23 – 2021-07-26 (×8): 40 mg via ORAL
  Filled 2021-07-23 (×9): qty 1

## 2021-07-23 MED ORDER — DIAZEPAM 5 MG PO TABS
5.0000 mg | ORAL_TABLET | Freq: Three times a day (TID) | ORAL | Status: DC | PRN
  Administered 2021-07-23 – 2021-07-25 (×2): 5 mg via ORAL
  Filled 2021-07-23 (×2): qty 1

## 2021-07-23 MED ORDER — ENSURE ENLIVE PO LIQD
237.0000 mL | Freq: Three times a day (TID) | ORAL | Status: DC
  Administered 2021-07-23 – 2021-07-27 (×8): 237 mL via ORAL

## 2021-07-23 MED ORDER — ALLOPURINOL 100 MG PO TABS
300.0000 mg | ORAL_TABLET | Freq: Every day | ORAL | Status: DC
  Administered 2021-07-23 – 2021-07-25 (×4): 300 mg via ORAL
  Filled 2021-07-23 (×2): qty 1
  Filled 2021-07-23: qty 3
  Filled 2021-07-23 (×2): qty 1

## 2021-07-23 MED ORDER — ADULT MULTIVITAMIN W/MINERALS CH
2.0000 | ORAL_TABLET | Freq: Every day | ORAL | Status: DC
  Administered 2021-07-23 – 2021-07-26 (×4): 2 via ORAL
  Filled 2021-07-23 (×4): qty 2

## 2021-07-23 MED ORDER — LEVOTHYROXINE SODIUM 50 MCG PO TABS
50.0000 ug | ORAL_TABLET | Freq: Every day | ORAL | Status: DC
  Administered 2021-07-23 – 2021-07-27 (×5): 50 ug via ORAL
  Filled 2021-07-23 (×5): qty 1

## 2021-07-23 NOTE — Progress Notes (Signed)
PROGRESS NOTE    James Massey  XNT:[id number] DOB: 1946/05/01 DOA: 07/19/2021 PCP: Myrtis Hopping., MD   Brief Narrative:  James Massey is a 76 y.o. 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 He has a right foot drop and he is now not able to get up out of a chair or out of bed or into his wheelchair without assistance wife or CNA.  Assessment & Plan:  Altered mental status CT head negative for acute intracranial process. Has been having intermittent confusion for the past few days per wife. Pt oriented to himself and place currently.  Chest x-ray cannot rule out superimposed pneumonia and base of lung which could be contributing to confusion if he has acute infection.  CT of chest without contrast ordered to better evaluate and if pneumonia present will start antibiotic therapy.   Acute foot drop secondary to spinal cord compression due to mets as below Generalized weakness/ambulatory dysfunction Has 2 week history of worsening weakness to the point he is unable to get up or ambulate on his own.  His wife has to lift him into and out of bed and into/out of wheelchair.  Has known mass in lumbar spine on CT from Dec which showed mass effect on thecal sac.  Concern that mass causing more impingement of spinal cord causing the weakness in his legs. Obtain MRI of lumbar spine to better evaluate.  When MRI results are known may need neurosurgical input.  CKD (chronic kidney disease) stage 3, GFR 30-59 ml/min  Stable.  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  Was scheduled to have radiation therapy to L-spine this week for known metastatic lesion.  NSTEMI (non-ST elevated myocardial infarction)  Troponin downtrending  appropriately - likely reactive given above No symptoms - denies chest pain; mild dyspnea with exertion but this is chronic. Echo pending.  DVT prophylaxis: heparin drip per cardiology  Code Status:  DNR **confirmed with wife over the phone, patient has no living will or MOST form which we discussed would be useful moving forward in the future Family Communication:  Wife updated over the phone at length  Status is: Inpt  Dispo: The patient is from: Home              Anticipated d/c is to: SNF              Anticipated d/c date is: 72+h              Patient currently not medically stable for discharge  Consultants:  Interventional radiology, oncology  Procedures:  Spinal radiation as above  Antimicrobials:  None  Subjective: No acute issues or events overnight denies nausea vomiting diarrhea constipation headache fevers chills or chest pain  Objective: Vitals:   07/23/21 0252 07/23/21 0650 07/23/21 1113 07/23/21 1326  BP: 116/84 111/75 (!) 88/67 115/76  Pulse: 99 (!) 104 100 98  Resp: 17 17 18 18   Temp: 98.5 F (36.9 C) 98.5 F (36.9 C) 98.2 F (36.8 C) 98.2 F (36.8 C)  TempSrc:   Oral Oral  SpO2: 95% 92% 95% 96%  Weight:      Height:        Intake/Output Summary (Last 24 hours) at 07/23/2021 1519 Last data filed at 07/23/2021 1328 Gross per 24 hour  Intake 1085.04 ml  Output 2050 ml  Net -964.96 ml   Filed Weights   07/10/2021 1420 07/25/2021 1426  Weight: 81.6 kg 81.6 kg    Examination:  General exam: Appears calm and comfortable  Respiratory system: Clear to auscultation. Respiratory effort normal. Cardiovascular system: S1 & S2 heard, RRR. No JVD, murmurs, rubs, gallops or clicks. No pedal edema. Gastrointestinal system: Abdomen is nondistended, soft and nontender. No organomegaly or masses felt. Normal bowel sounds heard. Central nervous system: Alert and oriented. No focal neurological deficits. Extremities: Symmetric 5 x 5 power. Skin: No rashes,  lesions or ulcers Psychiatry: Judgement and insight appear normal. Mood & affect appropriate.   Data Reviewed: I have personally reviewed following labs and imaging studies  CBC: Recent Labs  Lab 07/08/2021 1531 07/23/21 0435  WBC 11.0* 7.1  NEUTROABS 8.8*  --   HGB 11.6* 10.7*  HCT 34.5* 32.2*  MCV 95.0 94.7  PLT 275 950   Basic Metabolic Panel: Recent Labs  Lab 07/21/2021 1531 07/23/21 0435  NA 132* 131*  K 4.4 3.8  CL 96* 97*  CO2 30 27  GLUCOSE 104* 110*  BUN 23 20  CREATININE 1.58* 1.40*  CALCIUM 11.3* 11.1*   GFR: Estimated Creatinine Clearance: 44.1 mL/min (A) (by C-G formula based on SCr of 1.4 mg/dL (H)). Liver Function Tests: Recent Labs  Lab 07/17/2021 1531  AST 34  ALT 42  ALKPHOS 89  BILITOT 0.7  PROT 6.5  ALBUMIN 3.0*   No results for input(s): LIPASE, AMYLASE in the last 168 hours. No results for input(s): AMMONIA in the last 168 hours. Coagulation Profile: Recent Labs  Lab 07/23/21 0435  INR 1.1   Cardiac Enzymes: No results for input(s): CKTOTAL, CKMB, CKMBINDEX, TROPONINI in the last 168 hours. BNP (last 3 results) No results for input(s): PROBNP in the last 8760 hours. HbA1C: No results for input(s): HGBA1C in the last 72 hours. CBG: No results for input(s): GLUCAP in the last 168 hours. Lipid Profile: No results for input(s): CHOL, HDL, LDLCALC, TRIG, CHOLHDL, LDLDIRECT in the last 72 hours. Thyroid Function Tests: No results for input(s): TSH, T4TOTAL, FREET4, T3FREE, THYROIDAB in the last 72 hours. Anemia Panel: No results for input(s): VITAMINB12, FOLATE, FERRITIN, TIBC, IRON, RETICCTPCT in the last 72 hours. Sepsis Labs: No results for input(s): PROCALCITON, LATICACIDVEN in the last 168 hours.  Recent Results (from the past 240 hour(s))  Resp Panel by RT-PCR (Flu A&B, Covid) Nasopharyngeal Swab     Status: None   Collection Time: 07/10/2021  5:01 PM   Specimen: Nasopharyngeal Swab; Nasopharyngeal(NP) swabs in vial transport  medium  Result Value Ref Range Status   SARS Coronavirus 2 by RT PCR NEGATIVE NEGATIVE Final    Comment: (NOTE) SARS-CoV-2 target nucleic acids are NOT DETECTED.  The SARS-CoV-2 RNA is generally detectable in upper respiratory specimens during the acute phase of infection. The lowest concentration of SARS-CoV-2 viral copies this assay can detect is 138 copies/mL. A negative result does not preclude SARS-Cov-2 infection and should not be used as the sole basis for treatment or other patient management decisions. A negative result may occur with  improper specimen collection/handling, submission of specimen other than nasopharyngeal swab, presence of viral mutation(s) within the areas targeted by this assay, and inadequate number of viral copies(<138 copies/mL). A negative result must be combined with clinical observations, patient history, and epidemiological information. The expected result is Negative.  Fact Sheet for Patients:  EntrepreneurPulse.com.au  Fact Sheet for Healthcare Providers:  IncredibleEmployment.be  This test is no t yet approved or cleared by the [place] and  has been authorized for detection and/or diagnosis of SARS-CoV-2 by FDA under an Emergency Use Authorization (EUA). This EUA will remain  in effect (meaning this test can be used) for the duration of the COVID-19 declaration under Section 564(b)(1) of the Act, 21 U.S.C.section 360bbb-3(b)(1), unless the authorization is terminated  or revoked sooner.       Influenza A by PCR NEGATIVE NEGATIVE Final   Influenza B by PCR NEGATIVE NEGATIVE Final    Comment: (NOTE) The Xpert Xpress SARS-CoV-2/FLU/RSV plus assay is intended as an aid in the diagnosis of influenza from Nasopharyngeal swab specimens and should not be used as a sole basis for treatment. Nasal washings and aspirates are unacceptable for Xpert Xpress SARS-CoV-2/FLU/RSV testing.  Fact Sheet for  Patients: EntrepreneurPulse.com.au  Fact Sheet for Healthcare Providers: IncredibleEmployment.be  This test is not yet approved or cleared by the [place] FDA and has been authorized for detection and/or diagnosis of SARS-CoV-2 by FDA under an Emergency Use Authorization (EUA). This EUA will remain in effect (meaning this test can be used) for the duration of the COVID-19 declaration under Section 564(b)(1) of the Act, 21 U.S.C. section 360bbb-3(b)(1), unless the authorization is terminated or revoked.  Performed at [facility], [place] [address] [place], [place] [address]          Radiology Studies: CT Head Wo Contrast  Result Date: 07/31/2021 CLINICAL DATA:  Mental status change, EXAM: CT HEAD WITHOUT CONTRAST TECHNIQUE: Contiguous axial images were obtained from the base of the skull through the vertex without intravenous contrast. RADIATION DOSE REDUCTION: This exam was performed according to the departmental dose-optimization program which includes automated exposure control, adjustment of the mA and/or kV according to patient size and/or use of iterative reconstruction technique. COMPARISON:  None. FINDINGS: Brain: No acute intracranial hemorrhage. No focal mass lesion. No CT evidence of acute infarction. No midline shift or mass effect. No hydrocephalus. Basilar cisterns are patent. There are periventricular and subcortical white matter hypodensities. Generalized cortical atrophy. Vascular: No hyperdense vessel or unexpected calcification. Skull: Normal. Negative for fracture or focal lesion. Sinuses/Orbits: Paranasal sinuses and mastoid air cells are clear. Orbits are clear. Other: None. IMPRESSION: 1. No acute intracranial findings. 2. Chronic atrophy and white matter microvascular disease. Electronically Signed   By: [name] M.D.   On: 07/26/2021 15:52   CT Chest Wo Contrast  Result Date: 07/27/2021 CLINICAL  DATA:  Shortness of breath EXAM: CT CHEST WITHOUT CONTRAST TECHNIQUE: Multidetector CT imaging of the chest was performed following the standard protocol without IV contrast. RADIATION DOSE REDUCTION: This exam was performed according to the departmental dose-optimization program which includes automated exposure control, adjustment of the mA and/or kV according to patient size and/or use of iterative reconstruction technique. COMPARISON:  Chest x-ray from earlier the same day, CT from 06/22/2021 FINDINGS: Cardiovascular: Somewhat limited due to lack of IV contrast. Atherosclerotic calcifications are noted. No aneurysmal dilatation is seen. Coronary calcifications are noted. No cardiac enlargement is seen. Mediastinum/Nodes: Thoracic inlet is within normal limits. No sizable hilar or mediastinal adenopathy is noted. The esophagus as visualized is within normal limits. Lungs/Pleura: Lungs are well aerated bilaterally. Mild emphysematous changes are seen. Scattered pulmonary nodules are noted throughout both lungs. Largest of these is noted on image number 51 of series 7 measuring up to 7 mm. This shows some increase in size when compare with the  prior exam. The left apical nodule seen on prior study is not as well visualized on today's study. No sizable effusion is noted. Scarring is noted in the right apex stable from the prior CT examination. No focal confluent infiltrate is noted. Upper Abdomen: No acute abnormality. 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 Musculoskeletal: Degenerative changes of the thoracic spine are noted. No acute rib abnormality is noted. Chronic mixed lytic and sclerotic lesion in the T3 vertebral body posteriorly is noted. There is an expansile lesion in the inferior aspect of the sternum stable in appearance from the prior exam. Left scapular lytic lesion is again noted and stable in appearance. IMPRESSION: No focal infiltrate is identified. Multiple bilateral pulmonary nodules are  noted which have increased in size from the prior exam consistent with mild progressive metastatic disease. Lytic lesions within the left scapula, sternum and T3 vertebral body stable in appearance from the prior exam consistent with metastatic disease. Aortic Atherosclerosis (ICD10-I70.0) and Emphysema (ICD10-J43.9). Electronically Signed   By: Inez Catalina M.D.   On: 07/27/2021 19:59   MR Lumbar Spine W Wo Contrast  Result Date: 07/15/2021 CLINICAL DATA:  Initial evaluation for low back pain, right foot drop. EXAM: MRI LUMBAR SPINE WITHOUT AND WITH CONTRAST TECHNIQUE: Multiplanar and multiecho pulse sequences of the lumbar spine were obtained without and with intravenous contrast. CONTRAST:  81mL GADAVIST GADOBUTROL 1 MMOL/ML IV SOLN COMPARISON:  CT from 06/22/2021. FINDINGS: Segmentation: Standard. Lowest well-formed disc space labeled the L5-S1 level. Alignment:  Mild levoscoliosis.  4 mm anterolisthesis of L4 on L5. Vertebrae: Large infiltrative and destructive metastatic lesion involving the right greater than left posterior elements at L2 through L4 again seen. Although exact measurements are difficult, mass measures approximately 6.1 x 10.6 x 7.0 cm in greatest dimensions. Lesion involves and largely obliterates the right greater than left posterior elements of L3 and L4. Additional superior extension to involve the posterior elements of L2 noted as well. Extraosseous extension into the adjacent right and posterior paraspinous musculature. Epidural extension into the right greater than left epidural space at the levels of L3 and L4, greatest at L3 where there is resultant severe spinal stenosis. Extension to involve the right L2 and L3 neural foramina noted as well. Involvement of the right greater than left L3 and L4 vertebral bodies. Associated pathologic fracture at L3 with mild 25% central height loss. No visible significant bony retropulsion. Vertebral body height otherwise maintained. No other  metastatic implants within the lumbar spine. Conus medullaris and cauda equina: Conus extends to the L1 level. Conus medullaris within normal limits. Paraspinal and other soft tissues: Paraspinous tumor extending from L2 through L4 as above. Paraspinous soft tissues demonstrate no other acute finding. 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 Disc levels: L1-2:  Negative interspace.  Mild facet hypertrophy.  No stenosis. L2-3: Disc desiccation with mild disc bulge, eccentric to the right. Moderate bilateral facet hypertrophy. Infiltrative tumor partially involves the right L2-3 facet and adjacent posterior paraspinous musculature. Tumor extends to partially involve the right neural foramen and epidural space. Resultant moderate spinal stenosis with right greater than left lateral recess narrowing. Mild to moderate right foraminal stenosis. Left neural foramina remains patent. L3-4: Extensive tumor involvement involving the right pedicle and posterior elements of L3, with partial involvement of the L3 vertebral body itself. Associated pathologic fracture with up to 25% central height loss without significant bony retropulsion. Extensive epidural involvement involving the right and posterior epidural space (series 11, image 20). Resultant severe  canal with bilateral subarticular stenosis, right worse than left. Thecal sac is markedly narrowed measuring approximately 4 mm in AP diameter at its most narrow point. Tumor extends to involve the right neural foramen with resultant moderate to severe right L3 foraminal stenosis. Mild to moderate left foraminal narrowing noted as well related to disc bulge and underlying facet disease. L4-5: 4 mm anterolisthesis with mild disc bulge and disc desiccation. Severe bilateral facet arthrosis. Tumor centered at the L3 level extends inferiorly to partially involve the posterior elements of L4. Small amount of epidural extension into the right posterior epidural space at this level (series  2, image 11). Resultant mild-to-moderate canal with bilateral subarticular stenosis. Foramina remain patent. L5-S1: Disc desiccation without significant disc bulge. Mild facet hypertrophy. No canal or lateral recess stenosis. Foramina remain patent. IMPRESSION: 1. Large infiltrative and destructive metastatic lesion centered about the right greater than left posterior elements of L2 through L4 as above. Tumor extension into the epidural space at the L3 and L4 levels, greatest at L3 where there is resultant severe spinal stenosis and near complete effacement of the thecal sac. Emergent neuro surgical and/or radiation oncology consultation recommended. 2. Additional tumor extension into the right L2 and L3 neural foramina. Resultant mild right L2 foraminal stenosis, with moderate to severe right L3 foraminal narrowing. 3. Associated pathologic fracture at L3 with up to 25% height loss without significant bony retropulsion. Electronically Signed   By: Jeannine Boga M.D.   On: 07/15/2021 23:43   DG Chest Portable 1 View  Result Date: 07/11/2021 CLINICAL DATA:  Evaluate for pneumonia EXAM: PORTABLE CHEST 1 VIEW COMPARISON:  March 15, 2020 FINDINGS: The heart size and mediastinal contours are within normal limits. Mild increased pulmonary interstitium is identified bilaterally. Mild hazy ground-glass opacities identified in the right lung base. There is no pleural effusion. The visualized skeletal structures are unremarkable. IMPRESSION: Mild increased pulmonary interstitium identified bilaterally, this can be seen in mild edema or viral pneumonitis. Mild hazy ground-glass opacities identified in the right lung base, superimposed pneumonia is not excluded. Electronically Signed   By: Abelardo Diesel M.D.   On: 07/25/2021 16:57   ECHOCARDIOGRAM COMPLETE  Result Date: 07/23/2021    ECHOCARDIOGRAM REPORT   Patient Name:   James Massey Date of Exam: 07/23/2021 Medical Rec #:  [id number]         Height:        68.0 in Accession #:    [id number]        Weight:       180.0 lb Date of Birth:  11-Mar-1946         BSA:          1.954 m Patient Age:    64 years          BP:           88/61 mmHg Patient Gender: M                 HR:           100 bpm. Exam Location:  Inpatient Procedure: 2D Echo, Cardiac Doppler and Color Doppler Indications:    NSTEMI  History:        Patient has prior history of Echocardiogram examinations, most                 recent 04/02/2020. Signs/Symptoms:Murmur. GERD / HLD.  Sonographer:    Beryle Beams Referring Phys: [id number] [place]  1. Left ventricular ejection  fraction, by estimation, is 65 to 70%. The left ventricle has normal function. The left ventricle has no regional wall motion abnormalities. There is mild concentric left ventricular hypertrophy. Left ventricular diastolic parameters were normal.  2. Right ventricular systolic function is normal. The right ventricular size is normal. Tricuspid regurgitation signal is inadequate for assessing PA pressure.  3. The mitral valve is grossly normal. Trivial mitral valve regurgitation. No evidence of mitral stenosis.  4. The aortic valve is tricuspid. There is mild calcification of the aortic valve. There is mild thickening of the aortic valve. Aortic valve regurgitation is not visualized. Mild aortic valve stenosis. Aortic valve mean gradient measures 15.7 mmHg. Aortic valve Vmax measures 2.71 m/s.  5. The inferior vena cava is normal in size with greater than 50% respiratory variability, suggesting right atrial pressure of 3 mmHg. FINDINGS  Left Ventricle: Left ventricular ejection fraction, by estimation, is 65 to 70%. The left ventricle has normal function. The left ventricle has no regional wall motion abnormalities. The left ventricular internal cavity size was normal in size. There is  mild concentric left ventricular hypertrophy. Left ventricular diastolic parameters were normal. Right Ventricle: The right ventricular  size is normal. No increase in right ventricular wall thickness. Right ventricular systolic function is normal. Tricuspid regurgitation signal is inadequate for assessing PA pressure. Left Atrium: Left atrial size was normal in size. Right Atrium: Right atrial size was normal in size. Pericardium: There is no evidence of pericardial effusion. Mitral Valve: The mitral valve is grossly normal. Trivial mitral valve regurgitation. No evidence of mitral valve stenosis. Tricuspid Valve: The tricuspid valve is grossly normal. Tricuspid valve regurgitation is trivial. No evidence of tricuspid stenosis. Aortic Valve: The aortic valve is tricuspid. There is mild calcification of the aortic valve. There is mild thickening of the aortic valve. Aortic valve regurgitation is not visualized. Mild aortic stenosis is present. Aortic valve mean gradient measures  15.7 mmHg. Aortic valve peak gradient measures 29.5 mmHg. Aortic valve area, by VTI measures 1.78 cm. Pulmonic Valve: The pulmonic valve was grossly normal. Pulmonic valve regurgitation is not visualized. No evidence of pulmonic stenosis. Aorta: The aortic root and ascending aorta are structurally normal, with no evidence of dilitation. Venous: The inferior vena cava is normal in size with greater than 50% respiratory variability, suggesting right atrial pressure of 3 mmHg. IAS/Shunts: The atrial septum is grossly normal.  LEFT VENTRICLE PLAX 2D LVIDd:         3.20 cm     Diastology LVIDs:         2.10 cm     LV e' medial:    8.21 cm/s LV PW:         1.10 cm     LV E/e' medial:  11.0 LV IVS:        1.20 cm     LV e' lateral:   9.70 cm/s LVOT diam:     2.20 cm     LV E/e' lateral: 9.3 LV SV:         86 LV SV Index:   44 LVOT Area:     3.80 cm  LV Volumes (MOD) LV vol d, MOD A4C: 81.0 ml LV vol s, MOD A2C: 74.8 ml LV vol s, MOD A4C: 34.9 ml LV SV MOD A4C:     81.0 ml RIGHT VENTRICLE RV S prime:     24.80 cm/s TAPSE (M-mode): 1.8 cm LEFT ATRIUM  Index        RIGHT  ATRIUM           Index LA diam:      4.20 cm 2.15 cm/m   RA Area:     11.70 cm LA Vol (A2C): 91.3 ml 46.72 ml/m  RA Volume:   21.30 ml  10.90 ml/m LA Vol (A4C): 44.0 ml 22.52 ml/m  AORTIC VALVE                     PULMONIC VALVE AV Area (Vmax):    1.68 cm      PV Vmax:       0.85 m/s AV Area (Vmean):   1.63 cm      PV Vmean:      58.400 cm/s AV Area (VTI):     1.78 cm      PV VTI:        0.176 m AV Vmax:           271.47 cm/s   PV Peak grad:  2.9 mmHg AV Vmean:          179.021 cm/s  PV Mean grad:  2.0 mmHg AV VTI:            0.483 m AV Peak Grad:      29.5 mmHg AV Mean Grad:      15.7 mmHg LVOT Vmax:         120.00 cm/s LVOT Vmean:        76.600 cm/s LVOT VTI:          0.226 m LVOT/AV VTI ratio: 0.47  AORTA Ao Root diam: 2.90 cm Ao Asc diam:  3.20 cm MITRAL VALVE MV Area (PHT): 3.91 cm    SHUNTS MV Decel Time: 194 msec    Systemic VTI:  0.23 m MV E velocity: 90.40 cm/s  Systemic Diam: 2.20 cm MV A velocity: 50.10 cm/s MV E/A ratio:  1.80 Eleonore Chiquito MD Electronically signed by Eleonore Chiquito MD Signature Date/Time: 07/23/2021/3:01:19 PM    Final     Scheduled Meds:  allopurinol  300 mg Oral QHS   aspirin EC  81 mg Oral Daily   atorvastatin  40 mg Oral QHS   calcium-vitamin D  2 tablet Oral BID WC   feeding supplement  237 mL Oral BID BM   levothyroxine  50 mcg Oral QAC breakfast   magnesium oxide  400 mg Oral BID   multivitamin with minerals  2 tablet Oral Daily   pantoprazole  40 mg Oral BID   Continuous Infusions:  heparin 1,400 Units/hr (07/23/21 1430)   lactated ringers 75 mL/hr at 07/23/21 1431     LOS: 1 day   Time spent: 34min; >40 minutes was spent coordinating emergent consult with neurosurgery and radiology oncology; discussing case with wife, and discussing code status and palliative care/goals of care  Little Ishikawa, DO [facility]  If 7PM-7AM, please contact night-coverage [email or web address]  07/23/2021, 3:19 PM

## 2021-07-23 NOTE — Telephone Encounter (Signed)
Ms. Hao called.  She wanted Dr. Alen Blew consulted/informed of all information because she wants his input on plans for patient. She has not spoken with hospitalist at this time.   Patient is currently admitted to hospital  - she brought him to ED 07/20/2021, due to increased temp and urinating 6 oz every 90 minutes while lying down. He also had increasing weakness and increased pain.   Pt had MRI and CT yesterday in ED.   Was informed by ED MD that patient has had small MI. She has notified his PCP regarding admission.   She has spoken with Dr. Tammi Klippel today and he advised her that patient should continue radiation w/appt today.

## 2021-07-23 NOTE — Progress Notes (Signed)
Initial Nutrition Assessment  DOCUMENTATION CODES:   Not applicable  INTERVENTION:  - will increase Ensure Enlive from BID to TID, each supplement provides 350 kcal and 20 grams of protein.   NUTRITION DIAGNOSIS:   Inadequate oral intake related to lethargy/confusion as evidenced by per patient/family report.  GOAL:   Patient will meet greater than or equal to 90% of their needs  MONITOR:   PO intake, Supplement acceptance, Labs, Weight trends  REASON FOR ASSESSMENT:   Malnutrition Screening Tool  ASSESSMENT:   76 y.o. male with medical history of metastatic renal cell carcinoma s/p nephrectomy and undergoing XRT, HLD, gout, anxiety, heart murmur, GERD, Barretts esophagus. He presented to the ED due to worsening weakness, AMS, and increased urinary frequency. He was also noted to have R foot drop with inability to get up from a chair or out of bed into his wheelchair without assistance. Prior or 07/07/21 he could walk with a walker. Wife reported that he will fall asleep while chewing/eating but no indication of aspiration PTA. It used to take him 20-30 minutes to consume a meal but now takes an hour and a half due to weakness and severe tiredness.  Patient laying in bed with no visitors present at the time of RD visit. Lunch tray was in the room. Helped set patient up for the meal, cut his hamburger into fourths, and helped him with coleslaw.   Patient is noted to have eaten 50% of breakfast this AM. Nursing student was at bedside throughout RD visit and reports that patient drank a full Ensure earlier today.   Unable to obtain any further nutrition-related information from patient at this time. Patient shares that he feels embarrassed that he is having difficulty remembering things or cannot think of the right word(s) to express what he would like to say.   Weight yesterday was documented as 180 lb which appears to be a stated weight. It appears that weight has overall been  stable since 11/2020.  Of note, non-pitting edema to BUE and LLE and very deep pitting edema to RLE documented in the edema section of flow sheet.    Labs reviewed; Na: 131 mmol/l, Cl: 97 mmol/l, creatinine: 1.4 mg/dl, Ca: 11.1 mg/dl, GFR: 52 ml/min.  Medications reviewed; 2 tablets oscal-D BID, 50 mcg oral synthroid/day, 400 mg mag-ox BID, 40 mg oral protonix BID.  IVF; LR @ 75 ml/hr.     NUTRITION - FOCUSED PHYSICAL EXAM:  Flowsheet Row Most Recent Value  Orbital Region No depletion  Upper Arm Region No depletion  Thoracic and Lumbar Region Unable to assess  Buccal Region Mild depletion  Temple Region Mild depletion  Clavicle Bone Region Mild depletion  Clavicle and Acromion Bone Region No depletion  Scapular Bone Region No depletion  Dorsal Hand No depletion  Patellar Region Unable to assess  Anterior Thigh Region Unable to assess  Posterior Calf Region Unable to assess  Edema (RD Assessment) Unable to assess  Hair Reviewed  Eyes Reviewed  Mouth Reviewed  Skin Reviewed  Nails Reviewed       Diet Order:   Diet Order             Diet Heart Room service appropriate? Yes; Fluid consistency: Thin  Diet effective now                   EDUCATION NEEDS:   No education needs have been identified at this time  Skin:  Skin Assessment: Reviewed RN Assessment  Last  BM:  PTA/unknown  Height:   Ht Readings from Last 1 Encounters:  07/23/2021 5\' 8"  (1.727 m)    Weight:   Wt Readings from Last 1 Encounters:  07/21/2021 81.6 kg     Estimated Nutritional Needs:  Kcal:  2040-2285 kcal Protein:  105-115 grams Fluid:  >/= 2.2 L/day     Jarome Matin, MS, RD, LDN Inpatient Clinical Dietitian RD pager # available in [place]  After hours/weekend pager # available in [facility]

## 2021-07-23 NOTE — Progress Notes (Signed)
°  Radiation Oncology         [phone] ________________________________  Name: James Massey MRN: [id number]  Date: 07/24/2021  DOB: 01-18-1946  Chart Note:  I received a call this morning as the on-call radiation oncologist about this patient.  He had a lumbar spine MRI showing thecal sac impingement and spinal nerve root compression causing a foot drop.  This was discovered during his recent admission for increasing weakness.  When I reviewed the patient's MRI, it appears that the MRI shows what we have seen on his CT imaging.  The patient was previously set up to receive stereotactic body radiotherapy to the lesion to the lumbar spine.  Fortunately, this treatment is planned and ready to be in.  We will begin radiation to the lumbar spine on an emergency basis today using stereotactic body radiation therapy for a total of 5 radiation treatments.  ________________________________  Sheral Apley Tammi Klippel, M.D.

## 2021-07-23 NOTE — Evaluation (Signed)
Physical Therapy Evaluation Patient Details Name: James Massey MRN: [id number] DOB: June 19, 1946 Today's Date: 07/23/2021  History of Present Illness  Patient is 76 y.o. male who presents with complaint of aggressively worsening weakness with altered mental status and urinary frequency. In ED EKG revealed NSTEMI with elevated troponin. Patient has metastatic renal cancer with known metastatic lesion to L3. Lumbar spine MRI showing thecal sac impingement and spinal nerve root compression causing a Rt foot drop. Additional PMH significant for anxiety, GERD, HLD,    Clinical Impression  James Massey is 76 y.o. male admitted with above HPI and diagnosis. Patient is currently limited by functional impairments below (see PT problem list). Patient lives with his wife and has had a major decline in mobility over the last month. Prior to decline he was ambulating in home with RW and was participating in [place]. He is now limited by Rt LE pain and weakness. Patient required Mod-Max assist to sit up to EOB. Patient will benefit from continued skilled PT interventions to address impairments and progress independence with mobility, recommending SNF rehab to improve ability to transfer for pt and caregiver safety prior to return home. Acute PT will follow and progress as able.        Recommendations for follow up therapy are one component of a multi-disciplinary discharge planning process, led by the attending physician.  Recommendations may be updated based on patient status, additional functional criteria and insurance authorization.  Follow Up Recommendations Skilled nursing-short term rehab (<3 hours/day)    Assistance Recommended at Discharge Frequent or constant Supervision/Assistance  Patient can return home with the following  Two people to help with bathing/dressing/bathroom;Two people to help with walking and/or transfers;Direct supervision/assist for medications management;Assistance with  feeding;Assistance with cooking/housework;Assist for transportation;Help with stairs or ramp for entrance;Direct supervision/assist for financial management    Equipment Recommendations Rollator (4 wheels) (defer to SNF)  Recommendations for Other Services       Functional Status Assessment Patient has had a recent decline in their functional status and demonstrates the ability to make significant improvements in function in a reasonable and predictable amount of time.     Precautions / Restrictions Precautions Precautions: Fall Precaution Comments: Rt LE pain and Rt foot drop. Restrictions Weight Bearing Restrictions: No      Mobility  Bed Mobility Overal bed mobility: Needs Assistance Bed Mobility: Supine to Sit, Sit to Supine     Supine to sit: Mod assist, Max assist, HOB elevated, +2 for safety/equipment Sit to supine: Max assist, +2 for physical assistance, +2 for safety/equipment, HOB elevated   General bed mobility comments: Mod-Max assist for transfer to EOB with cues for sequencing to reach Lt hand to bed rail and roll onto Rt side. Assist to bring LE's off EOB in controlled movement and pt using UE's to initiate sit up to EOB.    Transfers                   General transfer comment: pt decliend to attempt today    Ambulation/Gait                  Stairs            Wheelchair Mobility    Modified Rankin (Stroke Patients Only)       Balance Overall balance assessment: Needs assistance Sitting-balance support: Feet supported, Bilateral upper extremity supported Sitting balance-Leahy Scale: Fair  Pertinent Vitals/Pain Pain Assessment Pain Assessment: Faces Faces Pain Scale: Hurts even more Pain Location: Rt LE Pain Descriptors / Indicators: Discomfort, Constant, Shooting Pain Intervention(s): Limited activity within patient's tolerance, Monitored during session, Repositioned     Home Living Family/patient expects to be discharged to:: Private residence Living Arrangements: Spouse/significant other Available Help at Discharge: Family Type of Home: House Home Access: Ramped entrance     Alternate Level Stairs-Number of Steps: they have a chair lift to go upstairs but he can't use it (stopped using when got on walker - 1.5 years ago) has not been upstairs since summer time this year. Home Layout: Multi-level;Able to live on main level with bedroom/bathroom Home Equipment: BSC/3in1;Standard Walker;Rolling Walker (2 wheels);Rollator (4 wheels);Wheelchair - manual;Hospital bed (rollator (arms need to be an inch taller) too short - may need one ordered) Additional Comments: Pt started to use RW about 1.5 years ago for mobility and has required a wheelchair starting ~3 weeks ago. 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 does the exs fromOPPT (had to stop bc of chemo/radiation).    Prior Function Prior Level of Function : Needs assist  Cognitive Assist : Mobility (cognitive) Mobility (Cognitive): Intermittent cues   Physical Assist : Mobility (physical);ADLs (physical) Mobility (physical): Bed mobility;Transfers;Gait ADLs (physical): Grooming;Bathing;Dressing;Toileting;IADLs Mobility Comments: Patient started to decline on 12/29 when he noticed Rt foot drop. He had a fall on 07/07/21 and his wife caught him but had to call neighbors to help him off the floor. Pt has been using a wheelchair since then and his wife has had to help him to stand and pivot to wheelchair from bed. He has had several backward falls onto the bed or into the chair during transfers. ADLs Comments: [place], CNA bathes him on those days. sink bath. new guy PT at home - transfers for wheelchair and standing at sink.     Hand Dominance        Extremity/Trunk Assessment   Upper Extremity Assessment Upper Extremity Assessment: Defer to  OT evaluation    Lower Extremity Assessment Lower Extremity Assessment: RLE deficits/detail;LLE deficits/detail;Generalized weakness RLE Deficits / Details: pt with hypertonicity in Rt ankle. limited 1/5 ankle dorsiflexion strength and clonus with dorsiflexion (unrelenting beats) RLE: Unable to fully assess due to pain LLE Deficits / Details: tone noted in Lt ankle as well,    Cervical / Trunk Assessment Cervical / Trunk Assessment: Normal  Communication   Communication: No difficulties  Cognition Arousal/Alertness: Awake/alert Behavior During Therapy: WFL for tasks assessed/performed Overall Cognitive Status: Impaired/Different from baseline Area of Impairment: Memory, Following commands, Problem solving, Awareness                     Memory: Decreased short-term memory Following Commands: Follows one step commands with increased time, Follows multi-step commands inconsistently, Follows multi-step commands with increased time   Awareness: Anticipatory Problem Solving: Slow processing, Difficulty sequencing, Requires verbal cues          General Comments      Exercises     Assessment/Plan    PT Assessment Patient needs continued PT services  PT Problem List Decreased strength;Decreased range of motion;Decreased activity tolerance;Decreased balance;Decreased mobility;Decreased knowledge of use of DME;Decreased cognition;Decreased safety awareness;Pain       PT Treatment Interventions DME instruction;Gait training;Stair training;Functional mobility training;Therapeutic activities;Therapeutic exercise;Balance training;Patient/family education    PT Goals (Current goals can  be found in the Care Plan section)  Acute Rehab PT Goals Patient Stated Goal: to improve abiltiy to transfer to wheelchair PT Goal Formulation: With family Time For Goal Achievement: 08/06/21 Potential to Achieve Goals: Fair    Frequency Min 3X/week     Co-evaluation                AM-PAC PT "6 Clicks" Mobility  Outcome Measure Help needed turning from your back to your side while in a flat bed without using bedrails?: A Lot Help needed moving from lying on your back to sitting on the side of a flat bed without using bedrails?: A Lot Help needed moving to and from a bed to a chair (including a wheelchair)?: Total Help needed standing up from a chair using your arms (e.g., wheelchair or bedside chair)?: Total Help needed to walk in hospital room?: Total Help needed climbing 3-5 steps with a railing? : Total 6 Click Score: 8    End of Session   Activity Tolerance: Patient tolerated treatment well;Patient limited by pain Patient left: in bed;with call bell/phone within reach;with nursing/sitter in room;Other (comment) Nurse Communication: Mobility status;Other (comment) (pt's wife requesting information about radiation and plan for medical management) PT Visit Diagnosis: Unsteadiness on feet (R26.81);Other abnormalities of gait and mobility (R26.89);Muscle weakness (generalized) (M62.81);History of falling (Z91.81);Difficulty in walking, not elsewhere classified (R26.2);Other symptoms and signs involving the nervous system (R29.898)    Time: 0051-1021 PT Time Calculation (min) (ACUTE ONLY): 33 min   Charges:   PT Evaluation $PT Eval Moderate Complexity: 1 Mod PT Treatments $Therapeutic Activity: 8-22 mins        Verner Mould, DPT Acute Rehabilitation Services Office [phone] Pager [phone]   Jacques Navy 07/23/2021, 12:36 PM

## 2021-07-23 NOTE — TOC Initial Note (Addendum)
Transition of Care [facility]) - Initial/Assessment Note    Patient Details  Name: James Massey MRN: [id number] Date of Birth: September 21, 1945  [facility] CM/SW Contact:    Leeroy Cha, RN Phone Number: 07/23/2021, 8:18 AM  Clinical Narrative:                 Pt with hx of renal cell ca, will follow for possible hhc or hospice care as needed. Plan dc is to snf for rehab. Fl2 sent out perferred snf are [facility] and [facility]. Expected Discharge Plan: Home/Self Care Barriers to Discharge: Continued Medical Work up   Patient Goals and CMS Choice Patient states their goals for this hospitalization and ongoing recovery are:: to go back home CMS Medicare.gov Compare Post Acute Care list provided to:: Patient    Expected Discharge Plan and Services Expected Discharge Plan: Home/Self Care   Discharge Planning Services: CM Consult   Living arrangements for the past 2 months: Single Family Home                                      Prior Living Arrangements/Services Living arrangements for the past 2 months: Single Family Home Lives with:: Spouse Patient language and need for interpreter reviewed:: Yes Do you feel safe going back to the place where you live?: Yes            Criminal Activity/Legal Involvement Pertinent to Current Situation/Hospitalization: No - Comment as needed  Activities of Daily Living Home Assistive Devices/Equipment: Eyeglasses ADL Screening (condition at time of admission) Patient's cognitive ability adequate to safely complete daily activities?: No Is the patient deaf or have difficulty hearing?: No Does the patient have difficulty seeing, even when wearing glasses/contacts?: No Does the patient have difficulty concentrating, remembering, or making decisions?: Yes Patient able to express need for assistance with ADLs?: Yes Does the patient have difficulty dressing or bathing?: Yes Independently performs ADLs?:  No Communication: Needs assistance Is this a change from baseline?: Pre-admission baseline Dressing (OT): Needs assistance Is this a change from baseline?: Pre-admission baseline Grooming: Needs assistance Is this a change from baseline?: Pre-admission baseline Feeding: Needs assistance Is this a change from baseline?: Pre-admission baseline Bathing: Needs assistance Is this a change from baseline?: Pre-admission baseline Toileting: Needs assistance Is this a change from baseline?: Pre-admission baseline In/Out Bed: Needs assistance Is this a change from baseline?: Pre-admission baseline Walks in Home: Needs assistance Is this a change from baseline?: Pre-admission baseline Does the patient have difficulty walking or climbing stairs?: Yes Weakness of Legs: Both Weakness of Arms/Hands: Both  Permission Sought/Granted                  Emotional Assessment Appearance:: Appears stated age Attitude/Demeanor/Rapport: Sedated Affect (typically observed): Calm Orientation: : Oriented to Place, Oriented to Self, Oriented to  Time, Oriented to Situation Alcohol / Substance Use: Not Applicable Psych Involvement: No (comment)  Admission diagnosis:  Weakness [R53.1] NSTEMI (non-ST elevated myocardial infarction) [facility]) [I21.4] Patient Active Problem List   Diagnosis Date Noted   NSTEMI (non-ST elevated myocardial infarction) ([place]) 07/27/2021   Foot drop, right 07/19/2021   CKD (chronic kidney disease) stage 3, GFR 30-59 ml/min ([place]) 07/15/2021   B12 deficiency 01/16/2021   Depression 01/16/2021   Hypoalbuminemia 01/16/2021   Generalized weakness 01/16/2021   Hypothyroidism (acquired) 01/13/2021   Altered mental status 51/08/5850   Acute metabolic encephalopathy  01/09/2021   Hypokalemia 01/09/2021   Hypomagnesemia 01/09/2021   Hypocalcemia 01/09/2021   Primary malignant neoplasm of kidney 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 to other site [facility]) 09/05/2020   Goals of care,  counseling/discussion 09/05/2020   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

## 2021-07-23 NOTE — Consult Note (Addendum)
Cardiology Consultation:   Patient ID: James Massey MRN: [id number]; DOB: Apr 26, 1946  Admit date: 07/27/2021 Date of Consult: 07/23/2021  PCP:  Myrtis Hopping., MD   [facility] Providers Cardiologist:  Evalina Field, MD   Patient Profile:   James Massey is a 76 y.o. 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  History of Present Illness:   James Massey is a 76 year old male with the above history who is followed by Dr. Audie Massey.  Patient was referred to Dr. Audie Massey in 03/2020 after being found to be in atrial fibrillation at his PCPs office.  He was back in sinus rhythm when seen by Dr. Audie Massey the next day.  Echo and ZIO monitor were ordered for further evaluation.  Echo showed LVEF of 70 to 75% with normal wall motion and mild aortic stenosis.  We will monitor showed underlying sinus rhythm with 112 brief episodes of SVT, 2 short runs of NSVT, frequent PACs (8.7% burden), and rare PVCs.  Some of the episodes of SVT may have been atrial tachycardia with variable block but there was no definitive atrial fibrillation noted.  He was started on Toprol XL 25 mg daily.  He was not started on anticoagulation due to a low CHA2DS2-VASc score of 1 for age.  However, anticoagulation will was recommended for when he turned 81.  Of note, he does have hypertension listed in his chart but looks like this is secondary to Cabozantinib.   He presented to the [facility] ED on 07/28/2021 with progressive weakness and intermittent confusion.  In the ED, BP soft (as low as 84/57) but otherwise vital stable.  EKG showed normal sinus rhythm, rate 93 bpm, with abnormal R wave progression and T wave inversions noted in leads I, II, and aVL which  do appear to be new.  High-sensitivity troponin 146 >> 174.  BNP normal.  Chest x-ray showed mild increased pulmonary interstitium identified bilaterally, which can be seen with mild edema or viral pneumonitis) as well as a mild hazy ground-glass opacities identified in the right lung base.  Chest CT showed multiple bilateral pulmonary nodules increased in size from prior exam consistent with mild progressive metastatic disease as well as lytic lesions within the left scapula sternum and T3 vertebral body. Head CT showed no acute findings. WBC 11, Hgb 11.6, Plts 275. Na 132, K 4.4, Glucose 104, BUN 23, Cr 1.58. Albumin 3.0 but otherwise LFTs normal.  Urinalysis normal.  He was admitted for further evaluation.  Cardiology consulted for further evaluation.  At the time of this evaluation, patient resting comfortably in no acute distress.  He is alert and oriented to person, place, and time but could not tell me what brought him to the hospital. He had difficulty oriented questions and I frequently had to redirect him. No family a bedside. He reports progressive weakness and admits to some confusion over the last several weeks. He reports having a cough recent but no recent fevers or illness. He denies any chest pain, shortness of breath, orthopnea/PND, palpitations, lightheadedness/dizziness, syncope. He does report some lower extremity (mostly in his right leg) for the past 6 weeks but states it looks good now. He denies any abnormal bleeding in urine or stools.  Past Medical History:  Diagnosis Date   Anxiety    Barrett esophagus    GERD (gastroesophageal reflux disease)    Gout    Heart murmur    History of anemia    Kidney cancer, primary, 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 to other site [facility])    Mixed hyperlipidemia    Renal cell cancer, right ([place])    Right renal mass    Wears glasses     Past Surgical History:  Procedure Laterality Date   CYSTOSCOPY WITH RETROGRADE PYELOGRAM, URETEROSCOPY AND  STENT PLACEMENT Right 05/21/2018   Procedure: RIGHT RETROGRADE PYELOGRAM, RIGHT DIAGNOSTIC URETEROSCOPY AND STENT PLACEMENT;  Surgeon: Ardis Hughs, MD;  Location: [facility];  Service: Urology;  Laterality: Right;   INGUINAL HERNIA REPAIR Bilateral 09/2012   LAPAROSCOPIC NEPHRECTOMY, HAND ASSISTED Right 05/31/2018   Procedure: LAPAROSCOPIC RADICAL RIGHT NEPHRECTOMY;  Surgeon: Ardis Hughs, MD;  Location: [facility];  Service: Urology;  Laterality: Right;     Home Medications:  Prior to Admission medications   Medication Sig Start Date End Date Taking? Authorizing Provider  allopurinol (ZYLOPRIM) 300 MG tablet Take 300 mg by mouth at bedtime.    Yes [provider]  atorvastatin (LIPITOR) 40 MG tablet Take 40 mg by mouth at bedtime.    Yes [provider]  calcium-vitamin D (OSCAL WITH D) 500-200 MG-UNIT tablet Take 2 tablets by mouth 2 (two) times daily. 01/28/21  Yes [name], MD  diazepam (VALIUM) 5 MG tablet Take 5 mg by mouth every 8 (eight) hours as needed for anxiety.   Yes [provider]  furosemide (LASIX) 20 MG tablet TAKE 1 TABLET(20 MG) BY MOUTH DAILY FOR 14 DAYS Patient taking differently: Take 20 mg by mouth daily as needed for fluid or edema. 06/20/21  Yes [name], MD  hydrocortisone 2.5 % cream Apply 1 application topically 2 (two) times daily as needed (for hemorroidal flare ups).   Yes [provider]  levothyroxine (SYNTHROID) 50 MCG tablet Take 1 tablet (50 mcg total) by mouth daily before breakfast. 01/16/21  Yes Dessa Phi, DO  magnesium oxide (MAG-OX) 400 (240 Mg) MG tablet Take 1 tablet (400 mg total) by mouth 2 (two) times daily. 01/16/21  Yes Dessa Phi, DO  Multiple Vitamin (MULTIVITAMIN) tablet Take 2 tablets by mouth daily.   Yes [provider]  pantoprazole (PROTONIX) 40 MG tablet Take 40 mg by mouth 2 (two) times daily.   Yes [provider]  Simethicone 180 MG CAPS Take 180 mg by  mouth 3 (three) times daily as needed (for gas/indigestion.).   Yes [provider]  traMADol (ULTRAM) 50 MG tablet TAKE 1 TABLET(50 MG) BY MOUTH EVERY 6 HOURS AS NEEDED Patient taking differently: Take 50 mg by mouth every 6 (six) hours as needed for severe pain or moderate pain. 03/18/21  Yes [name], MD  levothyroxine (SYNTHROID) 25 MCG tablet TAKE 1 TABLET(25 MCG) BY MOUTH DAILY BEFORE AND BREAKFAST Patient not taking: Reported on 07/27/2021 07/05/21   [name], MD    Inpatient Medications: Scheduled Meds:  allopurinol  300 mg Oral QHS   aspirin EC  81 mg Oral Daily   atorvastatin  40 mg Oral QHS   calcium-vitamin D  2 tablet Oral BID WC   feeding supplement  237 mL Oral BID BM   levothyroxine  50 mcg Oral QAC breakfast   magnesium oxide  400 mg Oral BID   multivitamin with minerals  2 tablet Oral Daily  pantoprazole  40 mg Oral BID   Continuous Infusions:  heparin 1,400 Units/hr (07/23/21 0747)   lactated ringers 75 mL/hr at 08/02/2021 2101   PRN Meds: acetaminophen **OR** acetaminophen, diazepam, ondansetron **OR** ondansetron (ZOFRAN) IV, senna-docusate, traMADol  Allergies:    Allergies  Allergen Reactions   Bee Venom Swelling   Budesonide-Formoterol Fumarate Other (See Comments)    RESPIRATORY ISSUES   Colchicine Nausea And Vomiting   Poison [employer] and ivy   Clindamycin/Lincomycin Rash    Social History:   Social History   Socioeconomic History   Marital status: Married    Spouse name: Not on file   Number of children: 2   Years of education: 16   Highest education level: Bachelor's degree (e.g., BA, AB, BS)  Occupational History   Occupation: retired Biochemist, clinical  Tobacco Use   Smoking status: Former    Packs/day: 0.50    Years: 15.00    Pack years: 7.50    Types: Cigarettes    Quit date: 12/12/1987    Years since quitting: 33.6   Smokeless tobacco: Never  Vaping Use   Vaping Use: Never used  Substance and  Sexual Activity   Alcohol use: Yes    Comment: occasional beer   Drug use: Never   Sexual activity: Not Currently  Other Topics Concern   Not on file  Social History Narrative   Lives with wife in [place].  Has two grown natural children. Currently has active cancer in several locations   In body, but has not recently spread to new areas according to patient. Retired from [employer].   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   Social Determinants of Radio broadcast assistant Strain: Not on file  Food Insecurity: Not on file  Transportation Needs: Not on file  Physical Activity: Not on file  Stress: Not on file  Social Connections: Not on file  Intimate Partner Violence: Not on file    Family History:   Family History  Problem Relation Age of Onset   Breast cancer Mother    Heart attack Father    Prostate cancer Neg Hx    Pancreatic cancer Neg Hx    Colon cancer Neg Hx      ROS:  Please see the history of present illness.  Difficulty to obtain full ROS due to altered mental status.     Physical Exam/Data:   Vitals:   07/13/2021 1842 07/16/2021 2302 07/23/21 0252 07/23/21 0650  BP: (!) 88/70 116/79 116/84 111/75  Pulse: 88 (!) 103 99 (!) 104  Resp: 17 18 17 17   Temp: 98.3 F (36.8 C) 98.4 F (36.9 C) 98.5 F (36.9 C) 98.5 F (36.9 C)  TempSrc: Oral     SpO2: 96% 95% 95% 92%  Weight:      Height:        Intake/Output Summary (Last 24 hours) at 07/23/2021 0858 Last data filed at 07/23/2021 0656 Gross per 24 hour  Intake 845.04 ml  Output 1400 ml  Net -554.96 ml   Last 3 Weights 07/13/2021 07/07/2021 07/03/2021  Weight (lbs) 180 lb 180 lb 180 lb  Weight (kg) 81.647 kg 81.647 kg 81.647 kg  Some encounter information is confidential and restricted. Go to Review Flowsheets activity to see all data.     Body mass index is 27.37 kg/m.  General:  76 y.o. Caucasian male  resting comfortably in no acute distress. HEENT: Normocephalic and atraumatic. Sclera clear.  Neck: Supple. No carotid bruits. No JVD. Heart: RRR. Distinct S1 and S2. II-III/VI systolic murmur. Radial and distal pedal pulses 2+ and equal bilaterally. Lungs: No increased work of breathing. Clear to ausculation bilaterally. No wheezes, rhonchi, or rales.  Abdomen: Soft, non-distended, and non-tender to palpation. Bowel sounds present. MSK: Normal strength and tone for age. Extremities: Mild edema of right ankle/foot.  Skin: Warm and dry. Neuro: Alert and oriented x3. No focal deficits. Psych: Normal affect. Responds appropriately.  EKG:  The EKG was personally reviewed and demonstrates:  Normal sinus rhythm, rate 93 bpm, with abnormal R wave progression and T wave inversions noted in leads I, II, and aVL which do appear to be new.  Telemetry:  Telemetry was personally reviewed and demonstrates:  Sinus rhythm with PVCs and ventricular couplets. Rates in the 90s to 110s.  Relevant CV Studies:  Echocardiogram 04/02/2020: Impressions:  1. Global longitudinal strain is -19.2%. Left ventricular ejection  fraction, by estimation, is 70 to 75%. The left ventricle has hyperdynamic  function. The left ventricle has no regional wall motion abnormalities.  Left ventricular diastolic parameters  were normal.   2. Right ventricular systolic function is normal. The right ventricular  size is normal.   3. AV is trileaflet. It is thickened, calcified with minimally restricted  motion. Peak and mean gradients through the valve is 15 and 8 mm Hg  respectively consistent with very mild AS.Marland Kitchen The aortic valve is abnormal.  Aortic valve regurgitation is not  visualized. Mild aortic valve stenosis.   4. Aortic dilatation noted. There is mild dilatation of the ascending  aorta, measuring 38 mm.   5. The inferior vena cava is normal in size with greater than 50%  respiratory variability, suggesting right  atrial pressure of 3 mmHg.  _______________  Elwyn Reach Monitor 03/2020: Enrollment 03/21/2020-03/28/2020 (7 days 1 hour). Patient had a min HR of 56 bpm (sinus bradycardia), max HR of 250 bpm (5 beats of supraventricular tachycardia; 1.7 second duration), and avg HR of 78 bpm (normal sinus rhythm). Predominant underlying rhythm was Sinus Rhythm. 2 non-sustained Ventricular Tachycardia runs occurred, the run with the fastest interval lasting 4 beats (1.5 seconds) with a max rate of 184 bpm (avg 171 bpm); the run with the fastest interval was also the longest. 112 Supraventricular Tachycardia runs occurred, the run with the fastest interval lasting 5 beats (1.7 second duration) with a max rate of 250 bpm, the longest lasting 6 beats (4.1 seconds) with an avg rate of 102 bpm. Some episodes of Supraventricular Tachycardia may be possible Atrial Tachycardia with variable block. Isolated SVEs were frequent (8.7%, [id number]), SVE Couplets were rare (<1.0%, 486), and SVE Triplets were rare (<1.0%, 1954). Isolated VEs were rare (<1.0%), and no VE Couplets or VE Triplets were present.   No diary submitted.    Impression: 1. Brief atrial tachycardia episodes (112 episodes in 7 days; longest duration 6 beats). 2. No definite atrial fibrillation.  3. Frequent PACs (8.7% burden).  Laboratory Data:  High Sensitivity Troponin:   Recent Labs  Lab 07/19/2021 1641 07/11/2021 1855  TROPONINIHS 146* 174*     Chemistry Recent Labs  Lab 08/01/2021 1531 07/23/21 0435  NA 132* 131*  K 4.4 3.8  CL 96* 97*  CO2 30 27  GLUCOSE 104* 110*  BUN 23 20  CREATININE 1.58* 1.40*  CALCIUM 11.3* 11.1*  GFRNONAA 45* 52*  ANIONGAP 6 7    Recent Labs  Lab 08/03/2021 1531  PROT 6.5  ALBUMIN 3.0*  AST 34  ALT 42  ALKPHOS 89  BILITOT 0.7   Lipids No results for input(s): CHOL, TRIG, HDL, LABVLDL, LDLCALC, CHOLHDL in the last 168 hours.  Hematology Recent Labs  Lab 08/04/2021 1531 07/23/21 0435  WBC 11.0* 7.1  RBC 3.63* 3.40*   HGB 11.6* 10.7*  HCT 34.5* 32.2*  MCV 95.0 94.7  MCH 32.0 31.5  MCHC 33.6 33.2  RDW 13.2 13.2  PLT 275 224   Thyroid No results for input(s): TSH, FREET4 in the last 168 hours.  BNP Recent Labs  Lab 07/24/2021 1601  BNP 62.5    DDimer No results for input(s): DDIMER in the last 168 hours.   Radiology/Studies:  CT Head Wo Contrast  Result Date: 08/05/2021 CLINICAL DATA:  Mental status change, EXAM: CT HEAD WITHOUT CONTRAST TECHNIQUE: Contiguous axial images were obtained from the base of the skull through the vertex without intravenous contrast. RADIATION DOSE REDUCTION: This exam was performed according to the departmental dose-optimization program which includes automated exposure control, adjustment of the mA and/or kV according to patient size and/or use of iterative reconstruction technique. COMPARISON:  None. FINDINGS: Brain: No acute intracranial hemorrhage. No focal mass lesion. No CT evidence of acute infarction. No midline shift or mass effect. No hydrocephalus. Basilar cisterns are patent. There are periventricular and subcortical white matter hypodensities. Generalized cortical atrophy. Vascular: No hyperdense vessel or unexpected calcification. Skull: Normal. Negative for fracture or focal lesion. Sinuses/Orbits: Paranasal sinuses and mastoid air cells are clear. Orbits are clear. Other: None. IMPRESSION: 1. No acute intracranial findings. 2. Chronic atrophy and white matter microvascular disease. Electronically Signed   By: [name] M.D.   On: 07/26/2021 15:52   CT Chest Wo Contrast  Result Date: 07/20/2021 CLINICAL DATA:  Shortness of breath EXAM: CT CHEST WITHOUT CONTRAST TECHNIQUE: Multidetector CT imaging of the chest was performed following the standard protocol without IV contrast. RADIATION DOSE REDUCTION: This exam was performed according to the departmental dose-optimization program which includes automated exposure control, adjustment of the mA and/or kV  according to patient size and/or use of iterative reconstruction technique. COMPARISON:  Chest x-ray from earlier the same day, CT from 06/22/2021 FINDINGS: Cardiovascular: Somewhat limited due to lack of IV contrast. Atherosclerotic calcifications are noted. No aneurysmal dilatation is seen. Coronary calcifications are noted. No cardiac enlargement is seen. Mediastinum/Nodes: Thoracic inlet is within normal limits. No sizable hilar or mediastinal adenopathy is noted. The esophagus as visualized is within normal limits. Lungs/Pleura: Lungs are well aerated bilaterally. Mild emphysematous changes are seen. Scattered pulmonary nodules are noted throughout both lungs. Largest of these is noted on image number 51 of series 7 measuring up to 7 mm. This shows some increase in size when compare with the prior exam. The left apical nodule seen on prior study is not as well visualized on today's study. No sizable effusion is noted. Scarring is noted in the right apex stable from the prior CT examination. No focal confluent infiltrate is noted. Upper Abdomen: No acute abnormality. 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 Musculoskeletal: Degenerative changes of the thoracic spine are noted. No acute rib abnormality is noted. Chronic mixed lytic and sclerotic lesion in the T3 vertebral body posteriorly is noted. There is an expansile lesion in the inferior aspect of the sternum stable in appearance from the prior exam. Left scapular lytic lesion is again noted and stable  in appearance. IMPRESSION: No focal infiltrate is identified. Multiple bilateral pulmonary nodules are noted which have increased in size from the prior exam consistent with mild progressive metastatic disease. Lytic lesions within the left scapula, sternum and T3 vertebral body stable in appearance from the prior exam consistent with metastatic disease. Aortic Atherosclerosis (ICD10-I70.0) and Emphysema (ICD10-J43.9). Electronically Signed   By: Inez Catalina M.D.   On: 07/18/2021 19:59   MR Lumbar Spine W Wo Contrast  Result Date: 07/18/2021 CLINICAL DATA:  Initial evaluation for low back pain, right foot drop. EXAM: MRI LUMBAR SPINE WITHOUT AND WITH CONTRAST TECHNIQUE: Multiplanar and multiecho pulse sequences of the lumbar spine were obtained without and with intravenous contrast. CONTRAST:  69mL GADAVIST GADOBUTROL 1 MMOL/ML IV SOLN COMPARISON:  CT from 06/22/2021. FINDINGS: Segmentation: Standard. Lowest well-formed disc space labeled the L5-S1 level. Alignment:  Mild levoscoliosis.  4 mm anterolisthesis of L4 on L5. Vertebrae: Large infiltrative and destructive metastatic lesion involving the right greater than left posterior elements at L2 through L4 again seen. Although exact measurements are difficult, mass measures approximately 6.1 x 10.6 x 7.0 cm in greatest dimensions. Lesion involves and largely obliterates the right greater than left posterior elements of L3 and L4. Additional superior extension to involve the posterior elements of L2 noted as well. Extraosseous extension into the adjacent right and posterior paraspinous musculature. Epidural extension into the right greater than left epidural space at the levels of L3 and L4, greatest at L3 where there is resultant severe spinal stenosis. Extension to involve the right L2 and L3 neural foramina noted as well. Involvement of the right greater than left L3 and L4 vertebral bodies. Associated pathologic fracture at L3 with mild 25% central height loss. No visible significant bony retropulsion. Vertebral body height otherwise maintained. No other metastatic implants within the lumbar spine. Conus medullaris and cauda equina: Conus extends to the L1 level. Conus medullaris within normal limits. Paraspinal and other soft tissues: Paraspinous tumor extending from L2 through L4 as above. Paraspinous soft tissues demonstrate no other acute finding. 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 Disc levels: L1-2:   Negative interspace.  Mild facet hypertrophy.  No stenosis. L2-3: Disc desiccation with mild disc bulge, eccentric to the right. Moderate bilateral facet hypertrophy. Infiltrative tumor partially involves the right L2-3 facet and adjacent posterior paraspinous musculature. Tumor extends to partially involve the right neural foramen and epidural space. Resultant moderate spinal stenosis with right greater than left lateral recess narrowing. Mild to moderate right foraminal stenosis. Left neural foramina remains patent. L3-4: Extensive tumor involvement involving the right pedicle and posterior elements of L3, with partial involvement of the L3 vertebral body itself. Associated pathologic fracture with up to 25% central height loss without significant bony retropulsion. Extensive epidural involvement involving the right and posterior epidural space (series 11, image 20). Resultant severe canal with bilateral subarticular stenosis, right worse than left. Thecal sac is markedly narrowed measuring approximately 4 mm in AP diameter at its most narrow point. Tumor extends to involve the right neural foramen with resultant moderate to severe right L3 foraminal stenosis. Mild to moderate left foraminal narrowing noted as well related to disc bulge and underlying facet disease. L4-5: 4 mm anterolisthesis with mild disc bulge and disc desiccation. Severe bilateral facet arthrosis. Tumor centered at the L3 level extends inferiorly to partially involve the posterior elements of L4. Small amount of epidural extension into the right posterior epidural space at this level (series 2, image 11). Resultant mild-to-moderate  canal with bilateral subarticular stenosis. Foramina remain patent. L5-S1: Disc desiccation without significant disc bulge. Mild facet hypertrophy. No canal or lateral recess stenosis. Foramina remain patent. IMPRESSION: 1. Large infiltrative and destructive metastatic lesion centered about the right greater than  left posterior elements of L2 through L4 as above. Tumor extension into the epidural space at the L3 and L4 levels, greatest at L3 where there is resultant severe spinal stenosis and near complete effacement of the thecal sac. Emergent neuro surgical and/or radiation oncology consultation recommended. 2. Additional tumor extension into the right L2 and L3 neural foramina. Resultant mild right L2 foraminal stenosis, with moderate to severe right L3 foraminal narrowing. 3. Associated pathologic fracture at L3 with up to 25% height loss without significant bony retropulsion. Electronically Signed   By: Jeannine Boga M.D.   On: 07/18/2021 23:43   DG Chest Portable 1 View  Result Date: 08/05/2021 CLINICAL DATA:  Evaluate for pneumonia EXAM: PORTABLE CHEST 1 VIEW COMPARISON:  March 15, 2020 FINDINGS: The heart size and mediastinal contours are within normal limits. Mild increased pulmonary interstitium is identified bilaterally. Mild hazy ground-glass opacities identified in the right lung base. There is no pleural effusion. The visualized skeletal structures are unremarkable. IMPRESSION: Mild increased pulmonary interstitium identified bilaterally, this can be seen in mild edema or viral pneumonitis. Mild hazy ground-glass opacities identified in the right lung base, superimposed pneumonia is not excluded. Electronically Signed   By: Abelardo Diesel M.D.   On: 07/09/2021 16:57     Assessment and Plan:   NSTEMI Patient presented with progressive weakness and altered mental status and was found to have an abnormal EKG and elevated troponin.  EKG showed new T wave inversions in leads I, II, and aVL.  High-sensitivity troponin elevated at 146 >> 174. - He denies any chest pain. - Echo pending. - Will repeat troponin to see where it peaks at and repeat EKG. - Continue IV Heparin. - Continue aspirin and high intensity statin.  No beta-blocker due to soft BP at times. - Will likely treat medically given  metastatic renal cancer.  Will discuss further with MD.  Paroxysmal Atrial Fibrillation Initially diagnosed in 03/2020.  No documented recurrence since that time.  He has not been on anticoagulation due to low CHA2DS2-VASc score of 1 although Dr. Audie Massey did recommend starting anticoagulation once he turned 75. - Maintaining sinus rhythm. Rates in the 90s to 110s.  - No AV nodal agents due to soft BP. BP initially soft with systolic BP in the 32R. This has improved. If BP remains stable, would try adding Lopressor 12.5mg  twice daily. - CHA2DS2-VASC = 3 (age 26 and CAD). Patient previously has not wanted to be on anticoagulation. Discussed this again today and he still is not sure. Currently on IV Heparin which we can continue to now. Can continue to discuss DOAC further.  Hyperlipidemia - Continue home Lipitor 40mg  daily. - Will check fasting lipid panel.  CKD Stage III Baseline creatinine around 1.3 to 1.7. Stable at 1.40 today.  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  CHA2DS2-VASc  Score = 3  This indicates a 3.2% annual risk of stroke. The patient's score is based upon: CHF History: 0 HTN History: 0 Diabetes History: 0 Stroke History: 0 Vascular Disease History: 1 Age Score: 2 Gender Score: 0     For questions or updates, please contact [facility] Please consult [email or web address] for contact info under    Signed, Darreld Mclean, PA-C  07/23/2021 8:58 AM   Personally seen and examined. Agree with APP above with the following comments: Briefly 76 yo M with Stage IV cancer  and PAF, CAC and Aortic atherosclerosis  who presents with resolving AMS and weakness in the setting of NSTEMI Patient notes that he is feeling fine outside of his back and side pain, which he was told is cancer related.  Has had no chest pain, chest pressure, chest tightness, chest stinging .  No shortness of breath.  No PND or orthopnea.  No weight gain, leg swelling , or abdominal swelling.  No syncope or near syncope . Notes  no palpitations or funny heart beats.   When he has had the atrial fibrillation he does not remember having symptoms.  He did not want AC and, when seen last by his cardiologist, he did not want to start [facility]  Exam notable for both a systolic and diastolic heart murmur.  Non pitting right ankle edema.  Occasional irregular beat consistent with PVCs seen on telemetry Labs notable for troponin (novel assay) 147->173-> 83  Would recommend  - Significant concern for demand ischemia; pending echo will like stop heparin and continue ASA.  If new WMA, will continue 48 hours of heparin and load plavix. - if BP improves will start low dose metoprolol - LDL is < 55, continue Lipitor 40 mg.  I am presently unclear of his long term survival projection in the setting of renal cancer with mets to lung and spine, may not be aggressive on secondary prevention - will follow into 07/24/21 and plan based on echo  Rudean Haskell, MD [place]  [address] [place], [place] [address] [phone]  1:21 PM

## 2021-07-23 NOTE — Progress Notes (Signed)
° °  Echocardiogram 2D Echocardiogram has been performed.  James Massey 07/23/2021, 1:56 PM

## 2021-07-23 NOTE — Progress Notes (Signed)
[place] for IV heparin Indication: chest pain/ACS  Allergies  Allergen Reactions   Bee Venom Swelling   Budesonide-Formoterol Fumarate Other (See Comments)    RESPIRATORY ISSUES   Colchicine Nausea And Vomiting   Poison [employer] and ivy   Clindamycin/Lincomycin Rash    Patient Measurements: Height: 5\' 8"  (172.7 cm) Weight: 81.6 kg (180 lb) IBW/kg (Calculated) : 68.4 Heparin Dosing Weight: TBW  Vital Signs: Temp: 98.5 F (36.9 C) (01/17 0650) BP: 111/75 (01/17 0650) Pulse Rate: 104 (01/17 0650)  Labs: Recent Labs    07/13/2021 1531 07/11/2021 1641 07/09/2021 1855 07/23/21 0435  HGB 11.6*  --   --  10.7*  HCT 34.5*  --   --  32.2*  PLT 275  --   --  224  LABPROT  --   --   --  13.9  INR  --   --   --  1.1  HEPARINUNFRC  --   --   --  0.12*  CREATININE 1.58*  --   --  1.40*  TROPONINIHS  --  146* 174*  --      Estimated Creatinine Clearance: 44.1 mL/min (A) (by C-G formula based on SCr of 1.4 mg/dL (H)).   Medical History: Past Medical History:  Diagnosis Date   Anxiety    Barrett esophagus    GERD (gastroesophageal reflux disease)    Gout    Heart murmur    History of anemia    Kidney cancer, primary, 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 to other site [facility])    Mixed hyperlipidemia    Renal cell cancer, right (HCC)    Right renal mass    Wears glasses     Medications:  Medications Prior to Admission  Medication Sig Dispense Refill Last Dose   allopurinol (ZYLOPRIM) 300 MG tablet Take 300 mg by mouth at bedtime.    07/21/2021   atorvastatin (LIPITOR) 40 MG tablet Take 40 mg by mouth at bedtime.    07/21/2021   calcium-vitamin D (OSCAL WITH D) 500-200 MG-UNIT tablet Take 2 tablets by mouth 2 (two) times daily. 60 tablet 0 08/01/2021   diazepam (VALIUM) 5 MG tablet Take 5 mg by mouth every 8 (eight) hours as needed for anxiety.   08/03/2021   furosemide (LASIX) 20 MG tablet TAKE 1 TABLET(20 MG) BY MOUTH DAILY FOR  14 DAYS (Patient taking differently: Take 20 mg by mouth daily as needed for fluid or edema.) 14 tablet 1 unknown   hydrocortisone 2.5 % cream Apply 1 application topically 2 (two) times daily as needed (for hemorroidal flare ups).   unknown   levothyroxine (SYNTHROID) 50 MCG tablet Take 1 tablet (50 mcg total) by mouth daily before breakfast. 30 tablet 2 08/01/2021   magnesium oxide (MAG-OX) 400 (240 Mg) MG tablet Take 1 tablet (400 mg total) by mouth 2 (two) times daily. 60 tablet 2 07/25/2021   Multiple Vitamin (MULTIVITAMIN) tablet Take 2 tablets by mouth daily.   08/05/2021   pantoprazole (PROTONIX) 40 MG tablet Take 40 mg by mouth 2 (two) times daily.   07/18/2021   Simethicone 180 MG CAPS Take 180 mg by mouth 3 (three) times daily as needed (for gas/indigestion.).   Past Week   traMADol (ULTRAM) 50 MG tablet TAKE 1 TABLET(50 MG) BY MOUTH EVERY 6 HOURS AS NEEDED (Patient taking differently: Take 50 mg by mouth every 6 (six) hours as needed for severe pain or moderate pain.)  60 tablet 1 07/18/2021   levothyroxine (SYNTHROID) 25 MCG tablet TAKE 1 TABLET(25 MCG) BY MOUTH DAILY BEFORE AND BREAKFAST (Patient not taking: Reported on 08/04/2021) 60 tablet 3 Not Taking   Scheduled:   allopurinol  300 mg Oral QHS   aspirin EC  81 mg Oral Daily   atorvastatin  40 mg Oral QHS   calcium-vitamin D  2 tablet Oral BID WC   feeding supplement  237 mL Oral BID BM   levothyroxine  50 mcg Oral QAC breakfast   magnesium oxide  400 mg Oral BID   multivitamin with minerals  2 tablet Oral Daily   pantoprazole  40 mg Oral BID   PRN: acetaminophen **OR** acetaminophen, diazepam, ondansetron **OR** ondansetron (ZOFRAN) IV, senna-docusate, traMADol  Assessment: 89 yoM with PMH metastatic RCC s/p nephrectomy in 2019, presents with progressive weakness, AMS, and urinary frequency. Troponins noted to be elevated. Cards consulted for NSTEMI; Pharmacy to dose IV heparin.  Baseline INR, aPTT: not done Prior  anticoagulation: none  Significant events:  Today, 07/23/2021: First heparin level subtherapeutic (0.12) on 1200 units/hr CBC: hgb slightly low; Plt WNL SCr elevated but appears near baseline No bleeding or infusion issues documented  Goal of Therapy: Heparin level 0.3-0.7 units/ml Monitor platelets by anticoagulation protocol: Yes  Plan: Heparin 2000 units IV bolus x 1 Increase Heparin 1400 units/hr IV infusion Check heparin level 8 hrs after rate increase Daily CBC, daily heparin level once stable Monitor for signs of bleeding or thrombosis  Peggyann Juba, PharmD, BCPS [phone] 07/23/2021, 7:08 AM

## 2021-07-23 NOTE — Progress Notes (Signed)
Pharmacy: Re- heparin  Patient's a 76 y.o M currently on heparin drip for NSTEMI.  - heparin level remains sub-therapeutic at 0.18 despite rate increased to 1400 units/hr earlier today. Per pt's RN, no issues with IV line and no bleeding noted - Troponin down 83  Goal of Therapy: Heparin level 0.3-0.7 units/ml Monitor platelets by anticoagulation protocol: Yes  Plan: - heparin 2000 units IV x1 bolus, then increase drip to 1650 units/hr - check 8 hr heparin level - monitor for s/sx bleeding  Dia Sitter, PharmD, BCPS 07/23/2021 4:44 PM

## 2021-07-24 ENCOUNTER — Ambulatory Visit: Payer: Medicare Other

## 2021-07-24 ENCOUNTER — Ambulatory Visit
Admission: RE | Admit: 2021-07-24 | Discharge: 2021-07-24 | Disposition: A | Discharge status: Home / Self Care | Payer: Medicare Other | Source: Ambulatory Visit | Attending: Radiation Oncology | Admitting: Radiation Oncology

## 2021-07-24 DIAGNOSIS — I248 Other forms of acute ischemic heart disease: Secondary | ICD-10-CM

## 2021-07-24 DIAGNOSIS — C641 Malignant neoplasm of right kidney, except renal pelvis: Secondary | ICD-10-CM | POA: Diagnosis not present

## 2021-07-24 DIAGNOSIS — N1831 Chronic kidney disease, stage 3a: Secondary | ICD-10-CM | POA: Diagnosis not present

## 2021-07-24 DIAGNOSIS — M21371 Foot drop, right foot: Secondary | ICD-10-CM

## 2021-07-24 LAB — CBC
HCT: 31.4 % — ABNORMAL LOW (ref 39.0–52.0)
Hemoglobin: 10.5 g/dL — ABNORMAL LOW (ref 13.0–17.0)
MCH: 31.4 pg (ref 26.0–34.0)
MCHC: 33.4 g/dL (ref 30.0–36.0)
MCV: 94 fL (ref 80.0–100.0)
Platelets: 218 10*3/uL (ref 150–400)
RBC: 3.34 MIL/uL — ABNORMAL LOW (ref 4.22–5.81)
RDW: 13.1 % (ref 11.5–15.5)
WBC: 8.1 10*3/uL (ref 4.0–10.5)
nRBC: 0 % (ref 0.0–0.2)

## 2021-07-24 LAB — SODIUM, URINE, RANDOM: Sodium, Ur: 60 mmol/L

## 2021-07-24 LAB — HEPARIN LEVEL (UNFRACTIONATED)
Heparin Unfractionated: 0.41 IU/mL (ref 0.30–0.70)
Heparin Unfractionated: 0.56 IU/mL (ref 0.30–0.70)

## 2021-07-24 LAB — OSMOLALITY, URINE: Osmolality, Ur: 227 mOsm/kg — ABNORMAL LOW (ref 300–900)

## 2021-07-24 LAB — CREATININE, URINE, RANDOM: Creatinine, Urine: 26.49 mg/dL

## 2021-07-24 MED ORDER — HEPARIN SODIUM (PORCINE) 5000 UNIT/ML IJ SOLN
5000.0000 [IU] | Freq: Three times a day (TID) | INTRAMUSCULAR | Status: DC
  Administered 2021-07-25 – 2021-07-27 (×6): 5000 [IU] via SUBCUTANEOUS
  Filled 2021-07-24 (×9): qty 1

## 2021-07-24 MED ORDER — METOPROLOL TARTRATE 25 MG PO TABS
12.5000 mg | ORAL_TABLET | Freq: Two times a day (BID) | ORAL | Status: DC
  Filled 2021-07-24: qty 1

## 2021-07-24 NOTE — Progress Notes (Incomplete)
PROGRESS NOTE    James Massey  ZOX:[id number] DOB: [birth year] DOA: 07/12/2021 PCP: Myrtis Hopping., MD   Brief Narrative: No notes on file   Assessment & Plan:   No notes have been filed under this hospital service. Service: Hospitalist     -----     DVT prophylaxis: *** Code Status:   Code Status: DNR Family Communication: *** Disposition Plan: ***   Consultants:  ***  Procedures:  ***  Antimicrobials: ***    Subjective: ***  Objective: Vitals:   07/23/21 1113 07/23/21 1326 07/23/21 1927 07/24/21 0439  BP: (!) 88/67 115/76 116/61 (!) 118/59  Pulse: 100 98 97 99  Resp: 18 18 18 18   Temp: 98.2 F (36.8 C) 98.2 F (36.8 C) 98.2 F (36.8 C) 98.1 F (36.7 C)  TempSrc: Oral Oral Oral Oral  SpO2: 95% 96% 95% 95%  Weight:      Height:        Intake/Output Summary (Last 24 hours) at 07/24/2021 1144 Last data filed at 07/24/2021 1005 Gross per 24 hour  Intake 1875.84 ml  Output 3400 ml  Net -1524.16 ml   Filed Weights   07/13/2021 1420 08/02/2021 1426  Weight: 81.6 kg 81.6 kg    Examination:  General exam: Appears calm and comfortable Respiratory system: Clear to auscultation. Respiratory effort normal. Cardiovascular system: S1 & S2 heard, RRR. 2/6 systolic murmur Gastrointestinal system: Abdomen is nondistended, soft and nontender. No organomegaly or masses felt. Normal bowel sounds heard. Central nervous system: Alert and oriented. Musculoskeletal: No edema. No calf tenderness Skin: No cyanosis. No rashes Psychiatry: Judgement and insight appear normal. Mood & affect appropriate.     Data Reviewed: I have personally reviewed following labs and imaging studies  CBC Lab Results  Component Value Date   WBC 8.1 07/24/2021   RBC 3.34 (L) 07/24/2021   HGB 10.5 (L) 07/24/2021   HCT 31.4 (L) 07/24/2021   MCV 94.0 07/24/2021   MCH 31.4 07/24/2021   PLT 218 07/24/2021   MCHC 33.4 07/24/2021   RDW 13.1 07/24/2021   LYMPHSABS 0.9  07/08/2021   MONOABS 1.1 (H) 08/02/2021   EOSABS 0.1 08/01/2021   BASOSABS 0.1 81/19/1478     Last metabolic panel Lab Results  Component Value Date   NA 131 (L) 07/23/2021   K 3.8 07/23/2021   CL 97 (L) 07/23/2021   CO2 27 07/23/2021   BUN 20 07/23/2021   CREATININE 1.40 (H) 07/23/2021   GLUCOSE 110 (H) 07/23/2021   GFRNONAA 52 (L) 07/23/2021   GFRAA 48 (L) 02/06/2020   CALCIUM 11.1 (H) 07/23/2021   PHOS 3.6 01/10/2021   PHOS 3.6 01/10/2021   PROT 6.5 07/12/2021   ALBUMIN 3.0 (L) 07/15/2021   BILITOT 0.7 08/06/2021   ALKPHOS 89 07/17/2021   AST 34 08/05/2021   ALT 42 07/16/2021   ANIONGAP 7 07/23/2021    CBG (last 3)  No results for input(s): GLUCAP in the last 72 hours.   GFR: Estimated Creatinine Clearance: 44.1 mL/min (A) (by C-G formula based on SCr of 1.4 mg/dL (H)).  Coagulation Profile: Recent Labs  Lab 07/23/21 0435  INR 1.1    Recent Results (from the past 240 hour(s))  Resp Panel by RT-PCR (Flu A&B, Covid) Nasopharyngeal Swab     Status: None   Collection Time: 07/09/2021  5:01 PM   Specimen: Nasopharyngeal Swab; Nasopharyngeal(NP) swabs in vial transport medium  Result Value Ref Range Status   SARS Coronavirus 2 by RT  PCR NEGATIVE NEGATIVE Final    Comment: (NOTE) SARS-CoV-2 target nucleic acids are NOT DETECTED.  The SARS-CoV-2 RNA is generally detectable in upper respiratory specimens during the acute phase of infection. The lowest concentration of SARS-CoV-2 viral copies this assay can detect is 138 copies/mL. A negative result does not preclude SARS-Cov-2 infection and should not be used as the sole basis for treatment or other patient management decisions. A negative result may occur with  improper specimen collection/handling, submission of specimen other than nasopharyngeal swab, presence of viral mutation(s) within the areas targeted by this assay, and inadequate number of viral copies(<138 copies/mL). A negative result must be combined  with clinical observations, patient history, and epidemiological information. The expected result is Negative.  Fact Sheet for Patients:  EntrepreneurPulse.com.au  Fact Sheet for Healthcare Providers:  IncredibleEmployment.be  This test is no t yet approved or cleared by the [place] FDA and  has been authorized for detection and/or diagnosis of SARS-CoV-2 by FDA under an Emergency Use Authorization (EUA). This EUA will remain  in effect (meaning this test can be used) for the duration of the COVID-19 declaration under Section 564(b)(1) of the Act, 21 U.S.C.section 360bbb-3(b)(1), unless the authorization is terminated  or revoked sooner.       Influenza A by PCR NEGATIVE NEGATIVE Final   Influenza B by PCR NEGATIVE NEGATIVE Final    Comment: (NOTE) The Xpert Xpress SARS-CoV-2/FLU/RSV plus assay is intended as an aid in the diagnosis of influenza from Nasopharyngeal swab specimens and should not be used as a sole basis for treatment. Nasal washings and aspirates are unacceptable for Xpert Xpress SARS-CoV-2/FLU/RSV testing.  Fact Sheet for Patients: EntrepreneurPulse.com.au  Fact Sheet for Healthcare Providers: IncredibleEmployment.be  This test is not yet approved or cleared by the [place] FDA and has been authorized for detection and/or diagnosis of SARS-CoV-2 by FDA under an Emergency Use Authorization (EUA). This EUA will remain in effect (meaning this test can be used) for the duration of the COVID-19 declaration under Section 564(b)(1) of the Act, 21 U.S.C. section 360bbb-3(b)(1), unless the authorization is terminated or revoked.  Performed at [facility], [place] [address], [place], [place] [address]         Radiology Studies: CT Head Wo Contrast  Result Date: 07/07/2021 CLINICAL DATA:  Mental status change, EXAM: CT HEAD WITHOUT CONTRAST TECHNIQUE: Contiguous  axial images were obtained from the base of the skull through the vertex without intravenous contrast. RADIATION DOSE REDUCTION: This exam was performed according to the departmental dose-optimization program which includes automated exposure control, adjustment of the mA and/or kV according to patient size and/or use of iterative reconstruction technique. COMPARISON:  None. FINDINGS: Brain: No acute intracranial hemorrhage. No focal mass lesion. No CT evidence of acute infarction. No midline shift or mass effect. No hydrocephalus. Basilar cisterns are patent. There are periventricular and subcortical white matter hypodensities. Generalized cortical atrophy. Vascular: No hyperdense vessel or unexpected calcification. Skull: Normal. Negative for fracture or focal lesion. Sinuses/Orbits: Paranasal sinuses and mastoid air cells are clear. Orbits are clear. Other: None. IMPRESSION: 1. No acute intracranial findings. 2. Chronic atrophy and white matter microvascular disease. Electronically Signed   By: [name] M.D.   On: 08/01/2021 15:52   CT Chest Wo Contrast  Result Date: 08/01/2021 CLINICAL DATA:  Shortness of breath EXAM: CT CHEST WITHOUT CONTRAST TECHNIQUE: Multidetector CT imaging of the chest was performed following the standard protocol without IV contrast. RADIATION DOSE REDUCTION: This exam  was performed according to the departmental dose-optimization program which includes automated exposure control, adjustment of the mA and/or kV according to patient size and/or use of iterative reconstruction technique. COMPARISON:  Chest x-ray from earlier the same day, CT from 06/22/2021 FINDINGS: Cardiovascular: Somewhat limited due to lack of IV contrast. Atherosclerotic calcifications are noted. No aneurysmal dilatation is seen. Coronary calcifications are noted. No cardiac enlargement is seen. Mediastinum/Nodes: Thoracic inlet is within normal limits. No sizable hilar or mediastinal adenopathy is noted.  The esophagus as visualized is within normal limits. Lungs/Pleura: Lungs are well aerated bilaterally. Mild emphysematous changes are seen. Scattered pulmonary nodules are noted throughout both lungs. Largest of these is noted on image number 51 of series 7 measuring up to 7 mm. This shows some increase in size when compare with the prior exam. The left apical nodule seen on prior study is not as well visualized on today's study. No sizable effusion is noted. Scarring is noted in the right apex stable from the prior CT examination. No focal confluent infiltrate is noted. Upper Abdomen: No acute abnormality. 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 Musculoskeletal: Degenerative changes of the thoracic spine are noted. No acute rib abnormality is noted. Chronic mixed lytic and sclerotic lesion in the T3 vertebral body posteriorly is noted. There is an expansile lesion in the inferior aspect of the sternum stable in appearance from the prior exam. Left scapular lytic lesion is again noted and stable in appearance. IMPRESSION: No focal infiltrate is identified. Multiple bilateral pulmonary nodules are noted which have increased in size from the prior exam consistent with mild progressive metastatic disease. Lytic lesions within the left scapula, sternum and T3 vertebral body stable in appearance from the prior exam consistent with metastatic disease. Aortic Atherosclerosis (ICD10-I70.0) and Emphysema (ICD10-J43.9). Electronically Signed   By: Inez Catalina M.D.   On: 07/09/2021 19:59   MR Lumbar Spine W Wo Contrast  Result Date: 07/08/2021 CLINICAL DATA:  Initial evaluation for low back pain, right foot drop. EXAM: MRI LUMBAR SPINE WITHOUT AND WITH CONTRAST TECHNIQUE: Multiplanar and multiecho pulse sequences of the lumbar spine were obtained without and with intravenous contrast. CONTRAST:  38mL GADAVIST GADOBUTROL 1 MMOL/ML IV SOLN COMPARISON:  CT from 06/22/2021. FINDINGS: Segmentation: Standard. Lowest  well-formed disc space labeled the L5-S1 level. Alignment:  Mild levoscoliosis.  4 mm anterolisthesis of L4 on L5. Vertebrae: Large infiltrative and destructive metastatic lesion involving the right greater than left posterior elements at L2 through L4 again seen. Although exact measurements are difficult, mass measures approximately 6.1 x 10.6 x 7.0 cm in greatest dimensions. Lesion involves and largely obliterates the right greater than left posterior elements of L3 and L4. Additional superior extension to involve the posterior elements of L2 noted as well. Extraosseous extension into the adjacent right and posterior paraspinous musculature. Epidural extension into the right greater than left epidural space at the levels of L3 and L4, greatest at L3 where there is resultant severe spinal stenosis. Extension to involve the right L2 and L3 neural foramina noted as well. Involvement of the right greater than left L3 and L4 vertebral bodies. Associated pathologic fracture at L3 with mild 25% central height loss. No visible significant bony retropulsion. Vertebral body height otherwise maintained. No other metastatic implants within the lumbar spine. Conus medullaris and cauda equina: Conus extends to the L1 level. Conus medullaris within normal limits. Paraspinal and other soft tissues: Paraspinous tumor extending from L2 through L4 as above. Paraspinous soft tissues demonstrate  no other acute finding. 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 Disc levels: L1-2:  Negative interspace.  Mild facet hypertrophy.  No stenosis. L2-3: Disc desiccation with mild disc bulge, eccentric to the right. Moderate bilateral facet hypertrophy. Infiltrative tumor partially involves the right L2-3 facet and adjacent posterior paraspinous musculature. Tumor extends to partially involve the right neural foramen and epidural space. Resultant moderate spinal stenosis with right greater than left lateral recess narrowing. Mild to moderate right  foraminal stenosis. Left neural foramina remains patent. L3-4: Extensive tumor involvement involving the right pedicle and posterior elements of L3, with partial involvement of the L3 vertebral body itself. Associated pathologic fracture with up to 25% central height loss without significant bony retropulsion. Extensive epidural involvement involving the right and posterior epidural space (series 11, image 20). Resultant severe canal with bilateral subarticular stenosis, right worse than left. Thecal sac is markedly narrowed measuring approximately 4 mm in AP diameter at its most narrow point. Tumor extends to involve the right neural foramen with resultant moderate to severe right L3 foraminal stenosis. Mild to moderate left foraminal narrowing noted as well related to disc bulge and underlying facet disease. L4-5: 4 mm anterolisthesis with mild disc bulge and disc desiccation. Severe bilateral facet arthrosis. Tumor centered at the L3 level extends inferiorly to partially involve the posterior elements of L4. Small amount of epidural extension into the right posterior epidural space at this level (series 2, image 11). Resultant mild-to-moderate canal with bilateral subarticular stenosis. Foramina remain patent. L5-S1: Disc desiccation without significant disc bulge. Mild facet hypertrophy. No canal or lateral recess stenosis. Foramina remain patent. IMPRESSION: 1. Large infiltrative and destructive metastatic lesion centered about the right greater than left posterior elements of L2 through L4 as above. Tumor extension into the epidural space at the L3 and L4 levels, greatest at L3 where there is resultant severe spinal stenosis and near complete effacement of the thecal sac. Emergent neuro surgical and/or radiation oncology consultation recommended. 2. Additional tumor extension into the right L2 and L3 neural foramina. Resultant mild right L2 foraminal stenosis, with moderate to severe right L3 foraminal  narrowing. 3. Associated pathologic fracture at L3 with up to 25% height loss without significant bony retropulsion. Electronically Signed   By: Jeannine Boga M.D.   On: 07/27/2021 23:43   DG Chest Portable 1 View  Result Date: 07/31/2021 CLINICAL DATA:  Evaluate for pneumonia EXAM: PORTABLE CHEST 1 VIEW COMPARISON:  March 15, 2020 FINDINGS: The heart size and mediastinal contours are within normal limits. Mild increased pulmonary interstitium is identified bilaterally. Mild hazy ground-glass opacities identified in the right lung base. There is no pleural effusion. The visualized skeletal structures are unremarkable. IMPRESSION: Mild increased pulmonary interstitium identified bilaterally, this can be seen in mild edema or viral pneumonitis. Mild hazy ground-glass opacities identified in the right lung base, superimposed pneumonia is not excluded. Electronically Signed   By: Abelardo Diesel M.D.   On: 07/18/2021 16:57   ECHOCARDIOGRAM COMPLETE  Result Date: 07/23/2021    ECHOCARDIOGRAM REPORT   Patient Name:   James Massey Date of Exam: 07/23/2021 Medical Rec #:  [id number]         Height:       68.0 in Accession #:    [id number]        Weight:       180.0 lb Date of Birth:  03-May-1946         BSA:  1.954 m Patient Age:    76 years          BP:           88/61 mmHg Patient Gender: M                 HR:           100 bpm. Exam Location:  Inpatient Procedure: 2D Echo, Cardiac Doppler and Color Doppler Indications:    NSTEMI  History:        Patient has prior history of Echocardiogram examinations, most                 recent 04/02/2020. Signs/Symptoms:Murmur. GERD / HLD.  Sonographer:    Beryle Beams Referring Phys: [id number] [place]  1. Left ventricular ejection fraction, by estimation, is 65 to 70%. The left ventricle has normal function. The left ventricle has no regional wall motion abnormalities. There is mild concentric left ventricular hypertrophy. Left  ventricular diastolic parameters were normal.  2. Right ventricular systolic function is normal. The right ventricular size is normal. Tricuspid regurgitation signal is inadequate for assessing PA pressure.  3. The mitral valve is grossly normal. Trivial mitral valve regurgitation. No evidence of mitral stenosis.  4. The aortic valve is tricuspid. There is mild calcification of the aortic valve. There is mild thickening of the aortic valve. Aortic valve regurgitation is not visualized. Mild aortic valve stenosis. Aortic valve mean gradient measures 15.7 mmHg. Aortic valve Vmax measures 2.71 m/s.  5. The inferior vena cava is normal in size with greater than 50% respiratory variability, suggesting right atrial pressure of 3 mmHg. FINDINGS  Left Ventricle: Left ventricular ejection fraction, by estimation, is 65 to 70%. The left ventricle has normal function. The left ventricle has no regional wall motion abnormalities. The left ventricular internal cavity size was normal in size. There is  mild concentric left ventricular hypertrophy. Left ventricular diastolic parameters were normal. Right Ventricle: The right ventricular size is normal. No increase in right ventricular wall thickness. Right ventricular systolic function is normal. Tricuspid regurgitation signal is inadequate for assessing PA pressure. Left Atrium: Left atrial size was normal in size. Right Atrium: Right atrial size was normal in size. Pericardium: There is no evidence of pericardial effusion. Mitral Valve: The mitral valve is grossly normal. Trivial mitral valve regurgitation. No evidence of mitral valve stenosis. Tricuspid Valve: The tricuspid valve is grossly normal. Tricuspid valve regurgitation is trivial. No evidence of tricuspid stenosis. Aortic Valve: The aortic valve is tricuspid. There is mild calcification of the aortic valve. There is mild thickening of the aortic valve. Aortic valve regurgitation is not visualized. Mild aortic stenosis  is present. Aortic valve mean gradient measures  15.7 mmHg. Aortic valve peak gradient measures 29.5 mmHg. Aortic valve area, by VTI measures 1.78 cm. Pulmonic Valve: The pulmonic valve was grossly normal. Pulmonic valve regurgitation is not visualized. No evidence of pulmonic stenosis. Aorta: The aortic root and ascending aorta are structurally normal, with no evidence of dilitation. Venous: The inferior vena cava is normal in size with greater than 50% respiratory variability, suggesting right atrial pressure of 3 mmHg. IAS/Shunts: The atrial septum is grossly normal.  LEFT VENTRICLE PLAX 2D LVIDd:         3.20 cm     Diastology LVIDs:         2.10 cm     LV e' medial:    8.21 cm/s LV PW:  1.10 cm     LV E/e' medial:  11.0 LV IVS:        1.20 cm     LV e' lateral:   9.70 cm/s LVOT diam:     2.20 cm     LV E/e' lateral: 9.3 LV SV:         86 LV SV Index:   44 LVOT Area:     3.80 cm  LV Volumes (MOD) LV vol d, MOD A4C: 81.0 ml LV vol s, MOD A2C: 74.8 ml LV vol s, MOD A4C: 34.9 ml LV SV MOD A4C:     81.0 ml RIGHT VENTRICLE RV S prime:     24.80 cm/s TAPSE (M-mode): 1.8 cm LEFT ATRIUM           Index        RIGHT ATRIUM           Index LA diam:      4.20 cm 2.15 cm/m   RA Area:     11.70 cm LA Vol (A2C): 91.3 ml 46.72 ml/m  RA Volume:   21.30 ml  10.90 ml/m LA Vol (A4C): 44.0 ml 22.52 ml/m  AORTIC VALVE                     PULMONIC VALVE AV Area (Vmax):    1.68 cm      PV Vmax:       0.85 m/s AV Area (Vmean):   1.63 cm      PV Vmean:      58.400 cm/s AV Area (VTI):     1.78 cm      PV VTI:        0.176 m AV Vmax:           271.47 cm/s   PV Peak grad:  2.9 mmHg AV Vmean:          179.021 cm/s  PV Mean grad:  2.0 mmHg AV VTI:            0.483 m AV Peak Grad:      29.5 mmHg AV Mean Grad:      15.7 mmHg LVOT Vmax:         120.00 cm/s LVOT Vmean:        76.600 cm/s LVOT VTI:          0.226 m LVOT/AV VTI ratio: 0.47  AORTA Ao Root diam: 2.90 cm Ao Asc diam:  3.20 cm MITRAL VALVE MV Area (PHT): 3.91 cm     SHUNTS MV Decel Time: 194 msec    Systemic VTI:  0.23 m MV E velocity: 90.40 cm/s  Systemic Diam: 2.20 cm MV A velocity: 50.10 cm/s MV E/A ratio:  1.80 Eleonore Chiquito MD Electronically signed by Eleonore Chiquito MD Signature Date/Time: 07/23/2021/3:01:19 PM    Final         Scheduled Meds:  allopurinol  300 mg Oral QHS   aspirin EC  81 mg Oral Daily   atorvastatin  40 mg Oral QHS   calcium-vitamin D  2 tablet Oral BID WC   feeding supplement  237 mL Oral TID BM   heparin injection (subcutaneous)  5,000 Units Subcutaneous Q8H   levothyroxine  50 mcg Oral QAC breakfast   magnesium oxide  400 mg Oral BID   metoprolol tartrate  12.5 mg Oral BID   multivitamin with minerals  2 tablet Oral Daily   pantoprazole  40 mg Oral BID   Continuous  Infusions:  lactated ringers 75 mL/hr at 07/24/21 0501     LOS: 2 days     Cordelia Poche, MD [facility] 07/24/2021, 11:44 AM  If 7PM-7AM, please contact night-coverage [email or web address]

## 2021-07-24 NOTE — Assessment & Plan Note (Addendum)
MRI L spine significant for large infiltrative/destructive metastatic lesion around L2 - L4 with extension into the epidural space at L3-L and resultant severe spinal stenosis. Radiation oncology consulted and recommended emergent stereotactic boxy radiation starting 1/17 with plan for a total of 5 treatments. Discussed with neurosurgery (1/18) with recommendation for no surgical intervention at this time.

## 2021-07-24 NOTE — Progress Notes (Signed)
ANTICOAGULATION CONSULT NOTE  Pharmacy Consult for IV heparin Indication: chest pain/ACS  Allergies  Allergen Reactions   Bee Venom Swelling   Budesonide-Formoterol Fumarate Other (See Comments)    RESPIRATORY ISSUES   Colchicine Nausea And Vomiting   Poison [employer] and ivy   Clindamycin/Lincomycin Rash    Patient Measurements: Height: 5\' 8"  (172.7 cm) Weight: 81.6 kg (180 lb) IBW/kg (Calculated) : 68.4 Heparin Dosing Weight: TBW  Vital Signs: Temp: 98.2 F (36.8 C) (01/17 1927) Temp Source: Oral (01/17 1927) BP: 116/61 (01/17 1927) Pulse Rate: 97 (01/17 1927)  Labs: Recent Labs    08/03/2021 1531 07/21/2021 1641 07/24/2021 1855 07/23/21 0435 07/23/21 0918 07/23/21 1554 07/24/21 0041  HGB 11.6*  --   --  10.7*  --   --  10.5*  HCT 34.5*  --   --  32.2*  --   --  31.4*  PLT 275  --   --  224  --   --  218  LABPROT  --   --   --  13.9  --   --   --   INR  --   --   --  1.1  --   --   --   HEPARINUNFRC  --   --   --  0.12*  --  0.18* 0.41  CREATININE 1.58*  --   --  1.40*  --   --   --   TROPONINIHS  --  146* 174*  --  83*  --   --      Estimated Creatinine Clearance: 44.1 mL/min (A) (by C-G formula based on SCr of 1.4 mg/dL (H)).   Medical History: Past Medical History:  Diagnosis Date   Anxiety    Barrett esophagus    GERD (gastroesophageal reflux disease)    Gout    Heart murmur    History of anemia    Kidney cancer, primary, 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 to other site [facility])    Mixed hyperlipidemia    Renal cell cancer, right (HCC)    Right renal mass    Wears glasses     Medications:  Medications Prior to Admission  Medication Sig Dispense Refill Last Dose   allopurinol (ZYLOPRIM) 300 MG tablet Take 300 mg by mouth at bedtime.    07/21/2021   atorvastatin (LIPITOR) 40 MG tablet Take 40 mg by mouth at bedtime.    07/21/2021   calcium-vitamin D (OSCAL WITH D) 500-200 MG-UNIT tablet Take 2 tablets by mouth 2 (two) times daily. 60 tablet  0 07/18/2021   diazepam (VALIUM) 5 MG tablet Take 5 mg by mouth every 8 (eight) hours as needed for anxiety.   07/16/2021   furosemide (LASIX) 20 MG tablet TAKE 1 TABLET(20 MG) BY MOUTH DAILY FOR 14 DAYS (Patient taking differently: Take 20 mg by mouth daily as needed for fluid or edema.) 14 tablet 1 unknown   hydrocortisone 2.5 % cream Apply 1 application topically 2 (two) times daily as needed (for hemorroidal flare ups).   unknown   levothyroxine (SYNTHROID) 50 MCG tablet Take 1 tablet (50 mcg total) by mouth daily before breakfast. 30 tablet 2 07/13/2021   magnesium oxide (MAG-OX) 400 (240 Mg) MG tablet Take 1 tablet (400 mg total) by mouth 2 (two) times daily. 60 tablet 2 07/19/2021   Multiple Vitamin (MULTIVITAMIN) tablet Take 2 tablets by mouth daily.   07/09/2021   pantoprazole (PROTONIX) 40 MG tablet Take 40  mg by mouth 2 (two) times daily.   07/21/2021   Simethicone 180 MG CAPS Take 180 mg by mouth 3 (three) times daily as needed (for gas/indigestion.).   Past Week   traMADol (ULTRAM) 50 MG tablet TAKE 1 TABLET(50 MG) BY MOUTH EVERY 6 HOURS AS NEEDED (Patient taking differently: Take 50 mg by mouth every 6 (six) hours as needed for severe pain or moderate pain.) 60 tablet 1 07/30/2021   levothyroxine (SYNTHROID) 25 MCG tablet TAKE 1 TABLET(25 MCG) BY MOUTH DAILY BEFORE AND BREAKFAST (Patient not taking: Reported on 07/21/2021) 60 tablet 3 Not Taking   Scheduled:   allopurinol  300 mg Oral QHS   aspirin EC  81 mg Oral Daily   atorvastatin  40 mg Oral QHS   calcium-vitamin D  2 tablet Oral BID WC   feeding supplement  237 mL Oral TID BM   levothyroxine  50 mcg Oral QAC breakfast   magnesium oxide  400 mg Oral BID   multivitamin with minerals  2 tablet Oral Daily   pantoprazole  40 mg Oral BID   PRN: acetaminophen **OR** acetaminophen, diazepam, ondansetron **OR** ondansetron (ZOFRAN) IV, senna-docusate, traMADol  Assessment: 22 yoM with PMH metastatic RCC s/p nephrectomy in 2019, presents  with progressive weakness, AMS, and urinary frequency. Troponins noted to be elevated. Cards consulted for NSTEMI; Pharmacy to dose IV heparin.  Baseline INR, aPTT: not done Prior anticoagulation: none  Significant events:  Today, 07/24/2021: Heparin level therapeutic (0.41) on 1650 units/hr CBC: hgb slightly low but stable (10.5); Plt WNL No bleeding or infusion issues noted  Goal of Therapy: Heparin level 0.3-0.7 units/ml Monitor platelets by anticoagulation protocol: Yes  Plan: Continue Heparin 1650 units/hr IV infusion Recheck heparin level in 8 hrs to confirm therapeutic dose Daily CBC, daily heparin level once stable Monitor for signs of bleeding or thrombosis  Leone Haven, PharmD 07/24/2021, 1:21 AM

## 2021-07-24 NOTE — TOC Progression Note (Addendum)
Transition of Care [facility]) - Progression Note    Patient Details  Name: James Massey MRN: [id number] Date of Birth: May 05, 1946  [facility] of [facility] CM/SW Contact  Leeroy Cha, RN Phone Number: 07/24/2021, 11:55 AM  Clinical Narrative:    Tct-[facility] nikki-will review chart and call back. Fl2 resent to [facility] via the hub Spoke with the wife informed her that I did confirm with [facility] that they would be able to bring patient to his scheduled r.t. treatments.she is very upset thinks the patient needs to stay until Friday .  I call her about placement on the 17th and she gave me choices for snf.  She is stating that this being thurst upon her and she has things that she must take care of in the morning.  Explained to her that patient will have scheduled  r.t. on [phone] before he is transferred.  She wants to talk to Dr. Tammi Klippel before he will go any where.  The charge nurse is involved and getting in touch with Dr. Tammi Klippel.  Expected Discharge Plan: Home/Self Care Barriers to Discharge: Continued Medical Work up  Expected Discharge Plan and Services Expected Discharge Plan: Home/Self Care   Discharge Planning Services: CM Consult   Living arrangements for the past 2 months: Single Family Home                                       Social Determinants of Health (SDOH) Interventions    Readmission Risk Interventions No flowsheet data found.

## 2021-07-24 NOTE — Progress Notes (Signed)
ANTICOAGULATION CONSULT NOTE  Pharmacy Consult for IV heparin Indication: chest pain/ACS  Allergies  Allergen Reactions   Bee Venom Swelling   Budesonide-Formoterol Fumarate Other (See Comments)    RESPIRATORY ISSUES   Colchicine Nausea And Vomiting   Poison [employer] and ivy   Clindamycin/Lincomycin Rash    Patient Measurements: Height: 5\' 8"  (172.7 cm) Weight: 81.6 kg (180 lb) IBW/kg (Calculated) : 68.4 Heparin Dosing Weight: TBW  Vital Signs: Temp: 98.1 F (36.7 C) (01/18 0439) Temp Source: Oral (01/18 0439) BP: 118/59 (01/18 0439) Pulse Rate: 99 (01/18 0439)  Labs: Recent Labs    08/04/2021 1531 08/06/2021 1531 07/28/2021 1641 07/16/2021 1855 07/23/21 0435 07/23/21 0918 07/23/21 1554 07/24/21 0041 07/24/21 0828  HGB 11.6*  --   --   --  10.7*  --   --  10.5*  --   HCT 34.5*  --   --   --  32.2*  --   --  31.4*  --   PLT 275  --   --   --  224  --   --  218  --   LABPROT  --   --   --   --  13.9  --   --   --   --   INR  --   --   --   --  1.1  --   --   --   --   HEPARINUNFRC  --    < >  --   --  0.12*  --  0.18* 0.41 0.56  CREATININE 1.58*  --   --   --  1.40*  --   --   --   --   TROPONINIHS  --   --  146* 174*  --  83*  --   --   --    < > = values in this interval not displayed.     Estimated Creatinine Clearance: 44.1 mL/min (A) (by C-G formula based on SCr of 1.4 mg/dL (H)).   Medical History: Past Medical History:  Diagnosis Date   Anxiety    Barrett esophagus    GERD (gastroesophageal reflux disease)    Gout    Heart murmur    History of anemia    Kidney cancer, primary, 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 to other site [facility])    Mixed hyperlipidemia    Renal cell cancer, right (HCC)    Right renal mass    Wears glasses     Medications:  Medications Prior to Admission  Medication Sig Dispense Refill Last Dose   allopurinol (ZYLOPRIM) 300 MG tablet Take 300 mg by mouth at bedtime.    07/21/2021   atorvastatin (LIPITOR) 40 MG tablet  Take 40 mg by mouth at bedtime.    07/21/2021   calcium-vitamin D (OSCAL WITH D) 500-200 MG-UNIT tablet Take 2 tablets by mouth 2 (two) times daily. 60 tablet 0 07/07/2021   diazepam (VALIUM) 5 MG tablet Take 5 mg by mouth every 8 (eight) hours as needed for anxiety.   07/16/2021   furosemide (LASIX) 20 MG tablet TAKE 1 TABLET(20 MG) BY MOUTH DAILY FOR 14 DAYS (Patient taking differently: Take 20 mg by mouth daily as needed for fluid or edema.) 14 tablet 1 unknown   hydrocortisone 2.5 % cream Apply 1 application topically 2 (two) times daily as needed (for hemorroidal flare ups).   unknown   levothyroxine (SYNTHROID) 50 MCG tablet Take 1 tablet (  50 mcg total) by mouth daily before breakfast. 30 tablet 2 08/05/2021   magnesium oxide (MAG-OX) 400 (240 Mg) MG tablet Take 1 tablet (400 mg total) by mouth 2 (two) times daily. 60 tablet 2 07/09/2021   Multiple Vitamin (MULTIVITAMIN) tablet Take 2 tablets by mouth daily.   07/23/2021   pantoprazole (PROTONIX) 40 MG tablet Take 40 mg by mouth 2 (two) times daily.   07/14/2021   Simethicone 180 MG CAPS Take 180 mg by mouth 3 (three) times daily as needed (for gas/indigestion.).   Past Week   traMADol (ULTRAM) 50 MG tablet TAKE 1 TABLET(50 MG) BY MOUTH EVERY 6 HOURS AS NEEDED (Patient taking differently: Take 50 mg by mouth every 6 (six) hours as needed for severe pain or moderate pain.) 60 tablet 1 07/09/2021   levothyroxine (SYNTHROID) 25 MCG tablet TAKE 1 TABLET(25 MCG) BY MOUTH DAILY BEFORE AND BREAKFAST (Patient not taking: Reported on 07/24/2021) 60 tablet 3 Not Taking   Scheduled:   allopurinol  300 mg Oral QHS   aspirin EC  81 mg Oral Daily   atorvastatin  40 mg Oral QHS   calcium-vitamin D  2 tablet Oral BID WC   feeding supplement  237 mL Oral TID BM   levothyroxine  50 mcg Oral QAC breakfast   magnesium oxide  400 mg Oral BID   multivitamin with minerals  2 tablet Oral Daily   pantoprazole  40 mg Oral BID   PRN: acetaminophen **OR** acetaminophen,  diazepam, ondansetron **OR** ondansetron (ZOFRAN) IV, senna-docusate, traMADol  Assessment: 6 yoM with PMH metastatic RCC s/p nephrectomy in 2019, presents with progressive weakness, AMS, and urinary frequency. Troponins noted to be elevated. Cards consulted for NSTEMI; Pharmacy to dose IV heparin.  Baseline INR, aPTT: not done Prior anticoagulation: none  Significant events:  Today, 07/24/2021: Heparin level therapeutic (0.56) on 1650 units/hr CBC: hgb slightly low but stable (10.5); Plt WNL No bleeding or infusion issues noted  Goal of Therapy: Heparin level 0.3-0.7 units/ml Monitor platelets by anticoagulation protocol: Yes  Plan: Continue Heparin 1650 units/hr IV infusion Daily CBC, daily heparin level once stable Monitor for signs of bleeding or thrombosis  Peggyann Juba, PharmD, BCPS Pharmacy: [id number] 07/24/2021, 9:09 AM

## 2021-07-24 NOTE — Progress Notes (Addendum)
Progress Note  Patient Name: James Massey Date of Encounter: 07/24/2021  Primary Cardiologist: Evalina Field, MD   Subjective   No Wall Motion abnormalities overnight. Notes issues with his catheter. Found to have new foot drop and lumbar spine impingement- we has planned for emergency SBTR.  Inpatient Medications    Scheduled Meds:  allopurinol  300 mg Oral QHS   aspirin EC  81 mg Oral Daily   atorvastatin  40 mg Oral QHS   calcium-vitamin D  2 tablet Oral BID WC   feeding supplement  237 mL Oral TID BM   levothyroxine  50 mcg Oral QAC breakfast   magnesium oxide  400 mg Oral BID   multivitamin with minerals  2 tablet Oral Daily   pantoprazole  40 mg Oral BID   Continuous Infusions:  heparin 1,650 Units/hr (07/24/21 0623)   lactated ringers 75 mL/hr at 07/24/21 0501   PRN Meds: acetaminophen **OR** acetaminophen, diazepam, ondansetron **OR** ondansetron (ZOFRAN) IV, senna-docusate, traMADol   Vital Signs    Vitals:   07/23/21 1113 07/23/21 1326 07/23/21 1927 07/24/21 0439  BP: (!) 88/67 115/76 116/61 (!) 118/59  Pulse: 100 98 97 99  Resp: 18 18 18 18   Temp: 98.2 F (36.8 C) 98.2 F (36.8 C) 98.2 F (36.8 C) 98.1 F (36.7 C)  TempSrc: Oral Oral Oral Oral  SpO2: 95% 96% 95% 95%  Weight:      Height:        Intake/Output Summary (Last 24 hours) at 07/24/2021 0951 Last data filed at 07/24/2021 4098 Gross per 24 hour  Intake 2115.84 ml  Output 2300 ml  Net -184.16 ml   Filed Weights   08/05/2021 1420 07/08/2021 1426  Weight: 81.6 kg 81.6 kg    Telemetry    SR to sinus tachycardia with paroxysmal bursts of SVT - Personally Reviewed  Physical Exam   Gen: No distress, elderly male   Neck: No JVD Cardiac: No Rubs or Gallops, Systolic Murmur, irregular tachycardia Respiratory: Clear to auscultation bilaterally, normal effort, normal  respiratory rate GI: Soft, nontender, non-distended  MS: No edema Integument: Skin feels warm Neuro:  At time of  evaluation, alert and oriented to person/place/time/situation  Psych: Anxious affect   Labs    Chemistry Recent Labs  Lab 07/08/2021 1531 07/23/21 0435  NA 132* 131*  K 4.4 3.8  CL 96* 97*  CO2 30 27  GLUCOSE 104* 110*  BUN 23 20  CREATININE 1.58* 1.40*  CALCIUM 11.3* 11.1*  PROT 6.5  --   ALBUMIN 3.0*  --   AST 34  --   ALT 42  --   ALKPHOS 89  --   BILITOT 0.7  --   GFRNONAA 45* 52*  ANIONGAP 6 7     Hematology Recent Labs  Lab 08/02/2021 1531 07/23/21 0435 07/24/21 0041  WBC 11.0* 7.1 8.1  RBC 3.63* 3.40* 3.34*  HGB 11.6* 10.7* 10.5*  HCT 34.5* 32.2* 31.4*  MCV 95.0 94.7 94.0  MCH 32.0 31.5 31.4  MCHC 33.6 33.2 33.4  RDW 13.2 13.2 13.1  PLT 275 224 218    Cardiac EnzymesNo results for input(s): TROPONINI in the last 168 hours. No results for input(s): TROPIPOC in the last 168 hours.   BNP Recent Labs  Lab 08/01/2021 1601  BNP 62.5     DDimer No results for input(s): DDIMER in the last 168 hours.   Radiology    CT Head Wo Contrast  Result Date: 07/10/2021  CLINICAL DATA:  Mental status change, EXAM: CT HEAD WITHOUT CONTRAST TECHNIQUE: Contiguous axial images were obtained from the base of the skull through the vertex without intravenous contrast. RADIATION DOSE REDUCTION: This exam was performed according to the departmental dose-optimization program which includes automated exposure control, adjustment of the mA and/or kV according to patient size and/or use of iterative reconstruction technique. COMPARISON:  None. FINDINGS: Brain: No acute intracranial hemorrhage. No focal mass lesion. No CT evidence of acute infarction. No midline shift or mass effect. No hydrocephalus. Basilar cisterns are patent. There are periventricular and subcortical white matter hypodensities. Generalized cortical atrophy. Vascular: No hyperdense vessel or unexpected calcification. Skull: Normal. Negative for fracture or focal lesion. Sinuses/Orbits: Paranasal sinuses and mastoid air  cells are clear. Orbits are clear. Other: None. IMPRESSION: 1. No acute intracranial findings. 2. Chronic atrophy and white matter microvascular disease. Electronically Signed   By: [name] M.D.   On: 07/15/2021 15:52   CT Chest Wo Contrast  Result Date: 07/27/2021 CLINICAL DATA:  Shortness of breath EXAM: CT CHEST WITHOUT CONTRAST TECHNIQUE: Multidetector CT imaging of the chest was performed following the standard protocol without IV contrast. RADIATION DOSE REDUCTION: This exam was performed according to the departmental dose-optimization program which includes automated exposure control, adjustment of the mA and/or kV according to patient size and/or use of iterative reconstruction technique. COMPARISON:  Chest x-ray from earlier the same day, CT from 06/22/2021 FINDINGS: Cardiovascular: Somewhat limited due to lack of IV contrast. Atherosclerotic calcifications are noted. No aneurysmal dilatation is seen. Coronary calcifications are noted. No cardiac enlargement is seen. Mediastinum/Nodes: Thoracic inlet is within normal limits. No sizable hilar or mediastinal adenopathy is noted. The esophagus as visualized is within normal limits. Lungs/Pleura: Lungs are well aerated bilaterally. Mild emphysematous changes are seen. Scattered pulmonary nodules are noted throughout both lungs. Largest of these is noted on image number 51 of series 7 measuring up to 7 mm. This shows some increase in size when compare with the prior exam. The left apical nodule seen on prior study is not as well visualized on today's study. No sizable effusion is noted. Scarring is noted in the right apex stable from the prior CT examination. No focal confluent infiltrate is noted. Upper Abdomen: No acute abnormality. 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 Musculoskeletal: Degenerative changes of the thoracic spine are noted. No acute rib abnormality is noted. Chronic mixed lytic and sclerotic lesion in the T3 vertebral body  posteriorly is noted. There is an expansile lesion in the inferior aspect of the sternum stable in appearance from the prior exam. Left scapular lytic lesion is again noted and stable in appearance. IMPRESSION: No focal infiltrate is identified. Multiple bilateral pulmonary nodules are noted which have increased in size from the prior exam consistent with mild progressive metastatic disease. Lytic lesions within the left scapula, sternum and T3 vertebral body stable in appearance from the prior exam consistent with metastatic disease. Aortic Atherosclerosis (ICD10-I70.0) and Emphysema (ICD10-J43.9). Electronically Signed   By: Inez Catalina M.D.   On: 08/02/2021 19:59   MR Lumbar Spine W Wo Contrast  Result Date: 07/14/2021 CLINICAL DATA:  Initial evaluation for low back pain, right foot drop. EXAM: MRI LUMBAR SPINE WITHOUT AND WITH CONTRAST TECHNIQUE: Multiplanar and multiecho pulse sequences of the lumbar spine were obtained without and with intravenous contrast. CONTRAST:  38mL GADAVIST GADOBUTROL 1 MMOL/ML IV SOLN COMPARISON:  CT from 06/22/2021. FINDINGS: Segmentation: Standard. Lowest well-formed disc space labeled the L5-S1 level.  Alignment:  Mild levoscoliosis.  4 mm anterolisthesis of L4 on L5. Vertebrae: Large infiltrative and destructive metastatic lesion involving the right greater than left posterior elements at L2 through L4 again seen. Although exact measurements are difficult, mass measures approximately 6.1 x 10.6 x 7.0 cm in greatest dimensions. Lesion involves and largely obliterates the right greater than left posterior elements of L3 and L4. Additional superior extension to involve the posterior elements of L2 noted as well. Extraosseous extension into the adjacent right and posterior paraspinous musculature. Epidural extension into the right greater than left epidural space at the levels of L3 and L4, greatest at L3 where there is resultant severe spinal stenosis. Extension to involve the  right L2 and L3 neural foramina noted as well. Involvement of the right greater than left L3 and L4 vertebral bodies. Associated pathologic fracture at L3 with mild 25% central height loss. No visible significant bony retropulsion. Vertebral body height otherwise maintained. No other metastatic implants within the lumbar spine. Conus medullaris and cauda equina: Conus extends to the L1 level. Conus medullaris within normal limits. Paraspinal and other soft tissues: Paraspinous tumor extending from L2 through L4 as above. Paraspinous soft tissues demonstrate no other acute finding. 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 Disc levels: L1-2:  Negative interspace.  Mild facet hypertrophy.  No stenosis. L2-3: Disc desiccation with mild disc bulge, eccentric to the right. Moderate bilateral facet hypertrophy. Infiltrative tumor partially involves the right L2-3 facet and adjacent posterior paraspinous musculature. Tumor extends to partially involve the right neural foramen and epidural space. Resultant moderate spinal stenosis with right greater than left lateral recess narrowing. Mild to moderate right foraminal stenosis. Left neural foramina remains patent. L3-4: Extensive tumor involvement involving the right pedicle and posterior elements of L3, with partial involvement of the L3 vertebral body itself. Associated pathologic fracture with up to 25% central height loss without significant bony retropulsion. Extensive epidural involvement involving the right and posterior epidural space (series 11, image 20). Resultant severe canal with bilateral subarticular stenosis, right worse than left. Thecal sac is markedly narrowed measuring approximately 4 mm in AP diameter at its most narrow point. Tumor extends to involve the right neural foramen with resultant moderate to severe right L3 foraminal stenosis. Mild to moderate left foraminal narrowing noted as well related to disc bulge and underlying facet disease. L4-5: 4 mm  anterolisthesis with mild disc bulge and disc desiccation. Severe bilateral facet arthrosis. Tumor centered at the L3 level extends inferiorly to partially involve the posterior elements of L4. Small amount of epidural extension into the right posterior epidural space at this level (series 2, image 11). Resultant mild-to-moderate canal with bilateral subarticular stenosis. Foramina remain patent. L5-S1: Disc desiccation without significant disc bulge. Mild facet hypertrophy. No canal or lateral recess stenosis. Foramina remain patent. IMPRESSION: 1. Large infiltrative and destructive metastatic lesion centered about the right greater than left posterior elements of L2 through L4 as above. Tumor extension into the epidural space at the L3 and L4 levels, greatest at L3 where there is resultant severe spinal stenosis and near complete effacement of the thecal sac. Emergent neuro surgical and/or radiation oncology consultation recommended. 2. Additional tumor extension into the right L2 and L3 neural foramina. Resultant mild right L2 foraminal stenosis, with moderate to severe right L3 foraminal narrowing. 3. Associated pathologic fracture at L3 with up to 25% height loss without significant bony retropulsion. Electronically Signed   By: Jeannine Boga M.D.   On: 07/17/2021 23:43  DG Chest Portable 1 View  Result Date: 07/27/2021 CLINICAL DATA:  Evaluate for pneumonia EXAM: PORTABLE CHEST 1 VIEW COMPARISON:  March 15, 2020 FINDINGS: The heart size and mediastinal contours are within normal limits. Mild increased pulmonary interstitium is identified bilaterally. Mild hazy ground-glass opacities identified in the right lung base. There is no pleural effusion. The visualized skeletal structures are unremarkable. IMPRESSION: Mild increased pulmonary interstitium identified bilaterally, this can be seen in mild edema or viral pneumonitis. Mild hazy ground-glass opacities identified in the right lung base,  superimposed pneumonia is not excluded. Electronically Signed   By: Abelardo Diesel M.D.   On: 07/26/2021 16:57   ECHOCARDIOGRAM COMPLETE  Result Date: 07/23/2021    ECHOCARDIOGRAM REPORT   Patient Name:   James Massey Date of Exam: 07/23/2021 Medical Rec #:  [id number]         Height:       68.0 in Accession #:    [id number]        Weight:       180.0 lb Date of Birth:  29-Apr-1946         BSA:          1.954 m Patient Age:    76 years          BP:           88/61 mmHg Patient Gender: M                 HR:           100 bpm. Exam Location:  Inpatient Procedure: 2D Echo, Cardiac Doppler and Color Doppler Indications:    NSTEMI  History:        Patient has prior history of Echocardiogram examinations, most                 recent 04/02/2020. Signs/Symptoms:Murmur. GERD / HLD.  Sonographer:    Beryle Beams Referring Phys: [id number] [place]  1. Left ventricular ejection fraction, by estimation, is 65 to 70%. The left ventricle has normal function. The left ventricle has no regional wall motion abnormalities. There is mild concentric left ventricular hypertrophy. Left ventricular diastolic parameters were normal.  2. Right ventricular systolic function is normal. The right ventricular size is normal. Tricuspid regurgitation signal is inadequate for assessing PA pressure.  3. The mitral valve is grossly normal. Trivial mitral valve regurgitation. No evidence of mitral stenosis.  4. The aortic valve is tricuspid. There is mild calcification of the aortic valve. There is mild thickening of the aortic valve. Aortic valve regurgitation is not visualized. Mild aortic valve stenosis. Aortic valve mean gradient measures 15.7 mmHg. Aortic valve Vmax measures 2.71 m/s.  5. The inferior vena cava is normal in size with greater than 50% respiratory variability, suggesting right atrial pressure of 3 mmHg. FINDINGS  Left Ventricle: Left ventricular ejection fraction, by estimation, is 65 to 70%. The left  ventricle has normal function. The left ventricle has no regional wall motion abnormalities. The left ventricular internal cavity size was normal in size. There is  mild concentric left ventricular hypertrophy. Left ventricular diastolic parameters were normal. Right Ventricle: The right ventricular size is normal. No increase in right ventricular wall thickness. Right ventricular systolic function is normal. Tricuspid regurgitation signal is inadequate for assessing PA pressure. Left Atrium: Left atrial size was normal in size. Right Atrium: Right atrial size was normal in size. Pericardium: There is no evidence of pericardial effusion. Mitral Valve: The mitral  valve is grossly normal. Trivial mitral valve regurgitation. No evidence of mitral valve stenosis. Tricuspid Valve: The tricuspid valve is grossly normal. Tricuspid valve regurgitation is trivial. No evidence of tricuspid stenosis. Aortic Valve: The aortic valve is tricuspid. There is mild calcification of the aortic valve. There is mild thickening of the aortic valve. Aortic valve regurgitation is not visualized. Mild aortic stenosis is present. Aortic valve mean gradient measures  15.7 mmHg. Aortic valve peak gradient measures 29.5 mmHg. Aortic valve area, by VTI measures 1.78 cm. Pulmonic Valve: The pulmonic valve was grossly normal. Pulmonic valve regurgitation is not visualized. No evidence of pulmonic stenosis. Aorta: The aortic root and ascending aorta are structurally normal, with no evidence of dilitation. Venous: The inferior vena cava is normal in size with greater than 50% respiratory variability, suggesting right atrial pressure of 3 mmHg. IAS/Shunts: The atrial septum is grossly normal.  LEFT VENTRICLE PLAX 2D LVIDd:         3.20 cm     Diastology LVIDs:         2.10 cm     LV e' medial:    8.21 cm/s LV PW:         1.10 cm     LV E/e' medial:  11.0 LV IVS:        1.20 cm     LV e' lateral:   9.70 cm/s LVOT diam:     2.20 cm     LV E/e'  lateral: 9.3 LV SV:         86 LV SV Index:   44 LVOT Area:     3.80 cm  LV Volumes (MOD) LV vol d, MOD A4C: 81.0 ml LV vol s, MOD A2C: 74.8 ml LV vol s, MOD A4C: 34.9 ml LV SV MOD A4C:     81.0 ml RIGHT VENTRICLE RV S prime:     24.80 cm/s TAPSE (M-mode): 1.8 cm LEFT ATRIUM           Index        RIGHT ATRIUM           Index LA diam:      4.20 cm 2.15 cm/m   RA Area:     11.70 cm LA Vol (A2C): 91.3 ml 46.72 ml/m  RA Volume:   21.30 ml  10.90 ml/m LA Vol (A4C): 44.0 ml 22.52 ml/m  AORTIC VALVE                     PULMONIC VALVE AV Area (Vmax):    1.68 cm      PV Vmax:       0.85 m/s AV Area (Vmean):   1.63 cm      PV Vmean:      58.400 cm/s AV Area (VTI):     1.78 cm      PV VTI:        0.176 m AV Vmax:           271.47 cm/s   PV Peak grad:  2.9 mmHg AV Vmean:          179.021 cm/s  PV Mean grad:  2.0 mmHg AV VTI:            0.483 m AV Peak Grad:      29.5 mmHg AV Mean Grad:      15.7 mmHg LVOT Vmax:         120.00 cm/s LVOT Vmean:  76.600 cm/s LVOT VTI:          0.226 m LVOT/AV VTI ratio: 0.47  AORTA Ao Root diam: 2.90 cm Ao Asc diam:  3.20 cm MITRAL VALVE MV Area (PHT): 3.91 cm    SHUNTS MV Decel Time: 194 msec    Systemic VTI:  0.23 m MV E velocity: 90.40 cm/s  Systemic Diam: 2.20 cm MV A velocity: 50.10 cm/s MV E/A ratio:  1.80 Eleonore Chiquito MD Electronically signed by Eleonore Chiquito MD Signature Date/Time: 07/23/2021/3:01:19 PM    Final      Patient Profile     76 y.o. 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   Medication Recommendations:  ASA, BB  Other recommendations (labs, testing, etc):  None Follow up as an outpatient:  Will work to arrange follow up  For questions or updates, please contact [facility] Please consult [email or web address] for contact info under Cardiology/STEMI.      Signed, Werner Lean, MD  07/24/2021, 9:51 AM    Called in interval: low blood pressure persists. Did not give metoprolol due to SBP < 100. He is asymptomatic from the P-SVT and PACs as we was prrior. We can restart his succinate 25 mg PO daily as outpatient.  Rudean Haskell, MD [place]  [address] [place], [place] [address] [phone]  4:19 PM

## 2021-07-24 NOTE — Hospital Course (Addendum)
Lyndall Windt is a 76 y.o. 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 Patient presented secondary to worsening weakness, altered mental status and increased urinary frequency. He was found to have right foot drop with inability to get up from chair/bed. MRI lumbar spine significant for metastatic disease with associated L3 cord compression. Radiation oncology consulted for radiation therapy. Patient developed rapid atrial flutter on 1/20 which responded to cardizem and amiodarone IV drip, but followed by hypotension and shock requiring transfer to stepdown in addition to [facility] consult.

## 2021-07-24 NOTE — Assessment & Plan Note (Addendum)
Stable. Increased urine output. Urine is dilute. Urine output possibly related to kidney disease vs hypercalcemia.

## 2021-07-24 NOTE — Progress Notes (Signed)
Patient with metastatic renal cell carcinoma with lower extremity weakness and increasing back pain.  Workup demonstrates evidence of destructive metastatic lesion involving the entire posterior elements at L3 and L4 and extending extensively paraspinally.  Patient with significant stenosis and some dorsiflexion weakness on 1 side but has good overall power to his legs otherwise.  There no good options for this patient.  Surgical debulking would be fraught with very high likelihood of significant surgical complication and extreme blood loss.  At this point as the canal itself as largely been destroyed by the tumor I think that palliative radiation is the most appropriate management.  I would keep the patient on IV Decadron to help with swelling and nerve irritation.

## 2021-07-24 NOTE — Assessment & Plan Note (Signed)
Patient with a history of right nephrectomy. Patient follows with Dr. Alen Blew as an outpatient.

## 2021-07-24 NOTE — Progress Notes (Signed)
Pt wife at bedside stating that patient is not supposed to go to radiation today. Radiation department called and they stated that patient is scheduled for radiation Wednesday, Thursday, Friday and Monday. Reviewed information with spouse. Spouse still refusing for patient to get radiation. Support spouse decision but explained that currently he is not on radiation schedule for next week and when she speaks with Dr Tresa Moore ensure schedule is updated.

## 2021-07-24 NOTE — Progress Notes (Signed)
PROGRESS NOTE    James Massey  OVZ:[id number] DOB: 1945/07/13 DOA: 07/15/2021 PCP: Myrtis Hopping., MD   Brief Narrative: James Massey is a 76 y.o. 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 Patient presented secondary to worsening weakness, altered mental status and increased urinary frequency. He was found to have right foot drop with inability to get up from chair/bed. MRI lumbar spine significant for metastatic disease with associated L3 cord compression. Radiation oncology consulted for radiation therapy.   Assessment & Plan:   * Primary malignant neoplasm of kidney 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 to other site [facility])- (present on admission) Patient with a history of right nephrectomy. Patient follows with Dr. Alen Blew as an outpatient.  Chronic kidney disease, stage 3a (HCC) Stable. 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 PT recommending SNF. 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  Demand ischemia ([facility])- (present on admission) Initial concern for NSTEMI. Heparin IV started. Cardiology consulted. Troponin peak of 174 with downtrend. Transthoracic Echocardiogram without wall motion abnormalities or LV dysfunction. Cardiology now signed off.  Altered mental status- (present on admission) Unknown etiology. CT head negative. Patient currently oriented to person and place. No evidence of infection. CT chest without pneumonia. Urinalysis is clear. Stable.  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 Radiation oncology consulted and recommended emergent stereotactic boxy radiation starting 1/17 with plan for a total of 5 treatments.       -----     DVT prophylaxis: Heparin subq Code Status:   Code Status: Full Code Family Communication: Wife on telephone (44 minutes) Disposition Plan: Discharge to SNF. Anticipate medical readiness by 1/21 after receiving radiation therapy this week and ability to continue receiving radiation therapy next week from SNF.   Consultants:  Radiation oncology Neurosurgery  Procedures:  Radiation therapy  Antimicrobials: None    Subjective: Continued back pain. No other concerns.  Objective: Vitals:   07/23/21 1927 07/24/21 0439 07/24/21 1354 07/24/21 1509  BP: 116/61 (!) 118/59 (!) 103/57 95/62  Pulse: 97 99 88   Resp: 18 18 18    Temp: 98.2 F (36.8 C) 98.1 F (36.7 C) 98.7 F (37.1 C)   TempSrc: Oral Oral Oral   SpO2: 95% 95% 96%   Weight:      Height:        Intake/Output Summary (Last 24 hours) at 07/24/2021 1610 Last data filed at 07/24/2021 1500 Gross per 24 hour  Intake 2237.47 ml  Output 3500 ml  Net -1262.53 ml   Filed Weights   07/21/2021 1420 08/01/2021 1426  Weight: 81.6 kg 81.6 kg    Examination:  General exam: Appears calm and comfortable Respiratory system: Clear to auscultation. Respiratory effort normal. Cardiovascular system: S1 & S2 heard, RRR. No murmurs, rubs, gallops or clicks. Gastrointestinal system: Abdomen is nondistended, soft and nontender. Normal bowel sounds heard. Central nervous system: Alert and oriented. Musculoskeletal: No edema. No calf tenderness Skin: No cyanosis. No rashes    Data Reviewed: I have personally reviewed following labs and imaging studies  CBC Lab Results  Component Value Date   WBC 8.1 07/24/2021   RBC 3.34 (L) 07/24/2021   HGB 10.5 (L) 07/24/2021   HCT 31.4 (L) 07/24/2021   MCV 94.0 07/24/2021  MCH 31.4 07/24/2021   PLT 218 07/24/2021   MCHC 33.4 07/24/2021   RDW 13.1 07/24/2021   LYMPHSABS 0.9 08/01/2021   MONOABS 1.1 (H) 08/02/2021   EOSABS 0.1 08/06/2021   BASOSABS 0.1 07/31/2021      Last metabolic panel Lab Results  Component Value Date   NA 131 (L) 07/23/2021   K 3.8 07/23/2021   CL 97 (L) 07/23/2021   CO2 27 07/23/2021   BUN 20 07/23/2021   CREATININE 1.40 (H) 07/23/2021   GLUCOSE 110 (H) 07/23/2021   GFRNONAA 52 (L) 07/23/2021   GFRAA 48 (L) 02/06/2020   CALCIUM 11.1 (H) 07/23/2021   PHOS 3.6 01/10/2021   PHOS 3.6 01/10/2021   PROT 6.5 07/21/2021   ALBUMIN 3.0 (L) 07/07/2021   BILITOT 0.7 07/13/2021   ALKPHOS 89 07/08/2021   AST 34 07/17/2021   ALT 42 07/30/2021   ANIONGAP 7 07/23/2021    CBG (last 3)  No results for input(s): GLUCAP in the last 72 hours.   GFR: Estimated Creatinine Clearance: 44.1 mL/min (A) (by C-G formula based on SCr of 1.4 mg/dL (H)).  Coagulation Profile: Recent Labs  Lab 07/23/21 0435  INR 1.1    Recent Results (from the past 240 hour(s))  Resp Panel by RT-PCR (Flu A&B, Covid) Nasopharyngeal Swab     Status: None   Collection Time: 07/21/2021  5:01 PM   Specimen: Nasopharyngeal Swab; Nasopharyngeal(NP) swabs in vial transport medium  Result Value Ref Range Status   SARS Coronavirus 2 by RT PCR NEGATIVE NEGATIVE Final    Comment: (NOTE) SARS-CoV-2 target nucleic acids are NOT DETECTED.  The SARS-CoV-2 RNA is generally detectable in upper respiratory specimens during the acute phase of infection. The lowest concentration of SARS-CoV-2 viral copies this assay can detect is 138 copies/mL. A negative result does not preclude SARS-Cov-2 infection and should not be used as the sole basis for treatment or other patient management decisions. A negative result may occur with  improper specimen collection/handling, submission of specimen other than nasopharyngeal swab, presence of viral mutation(s) within the areas targeted by this assay, and inadequate number of viral copies(<138 copies/mL). A negative result must be combined with clinical observations, patient history, and epidemiological information. The expected  result is Negative.  Fact Sheet for Patients:  EntrepreneurPulse.com.au  Fact Sheet for Healthcare Providers:  IncredibleEmployment.be  This test is no t yet approved or cleared by the [place] FDA and  has been authorized for detection and/or diagnosis of SARS-CoV-2 by FDA under an Emergency Use Authorization (EUA). This EUA will remain  in effect (meaning this test can be used) for the duration of the COVID-19 declaration under Section 564(b)(1) of the Act, 21 U.S.C.section 360bbb-3(b)(1), unless the authorization is terminated  or revoked sooner.       Influenza A by PCR NEGATIVE NEGATIVE Final   Influenza B by PCR NEGATIVE NEGATIVE Final    Comment: (NOTE) The Xpert Xpress SARS-CoV-2/FLU/RSV plus assay is intended as an aid in the diagnosis of influenza from Nasopharyngeal swab specimens and should not be used as a sole basis for treatment. Nasal washings and aspirates are unacceptable for Xpert Xpress SARS-CoV-2/FLU/RSV testing.  Fact Sheet for Patients: EntrepreneurPulse.com.au  Fact Sheet for Healthcare Providers: IncredibleEmployment.be  This test is not yet approved or cleared by the [place] FDA and has been authorized for detection and/or diagnosis of SARS-CoV-2 by FDA under an Emergency Use Authorization (EUA). This EUA will remain in effect (meaning this test can be  used) for the duration of the COVID-19 declaration under Section 564(b)(1) of the Act, 21 U.S.C. section 360bbb-3(b)(1), unless the authorization is terminated or revoked.  Performed at [facility], [address], [place], [place] [address]         Radiology Studies: CT Chest Wo Contrast  Result Date: 07/25/2021 CLINICAL DATA:  Shortness of breath EXAM: CT CHEST WITHOUT CONTRAST TECHNIQUE: Multidetector CT imaging of the chest was performed following the standard protocol without IV  contrast. RADIATION DOSE REDUCTION: This exam was performed according to the departmental dose-optimization program which includes automated exposure control, adjustment of the mA and/or kV according to patient size and/or use of iterative reconstruction technique. COMPARISON:  Chest x-ray from earlier the same day, CT from 06/22/2021 FINDINGS: Cardiovascular: Somewhat limited due to lack of IV contrast. Atherosclerotic calcifications are noted. No aneurysmal dilatation is seen. Coronary calcifications are noted. No cardiac enlargement is seen. Mediastinum/Nodes: Thoracic inlet is within normal limits. No sizable hilar or mediastinal adenopathy is noted. The esophagus as visualized is within normal limits. Lungs/Pleura: Lungs are well aerated bilaterally. Mild emphysematous changes are seen. Scattered pulmonary nodules are noted throughout both lungs. Largest of these is noted on image number 51 of series 7 measuring up to 7 mm. This shows some increase in size when compare with the prior exam. The left apical nodule seen on prior study is not as well visualized on today's study. No sizable effusion is noted. Scarring is noted in the right apex stable from the prior CT examination. No focal confluent infiltrate is noted. Upper Abdomen: No acute abnormality. 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 Musculoskeletal: Degenerative changes of the thoracic spine are noted. No acute rib abnormality is noted. Chronic mixed lytic and sclerotic lesion in the T3 vertebral body posteriorly is noted. There is an expansile lesion in the inferior aspect of the sternum stable in appearance from the prior exam. Left scapular lytic lesion is again noted and stable in appearance. IMPRESSION: No focal infiltrate is identified. Multiple bilateral pulmonary nodules are noted which have increased in size from the prior exam consistent with mild progressive metastatic disease. Lytic lesions within the left scapula, sternum and T3  vertebral body stable in appearance from the prior exam consistent with metastatic disease. Aortic Atherosclerosis (ICD10-I70.0) and Emphysema (ICD10-J43.9). Electronically Signed   By: Inez Catalina M.D.   On: 07/10/2021 19:59   MR Lumbar Spine W Wo Contrast  Result Date: 07/08/2021 CLINICAL DATA:  Initial evaluation for low back pain, right foot drop. EXAM: MRI LUMBAR SPINE WITHOUT AND WITH CONTRAST TECHNIQUE: Multiplanar and multiecho pulse sequences of the lumbar spine were obtained without and with intravenous contrast. CONTRAST:  72mL GADAVIST GADOBUTROL 1 MMOL/ML IV SOLN COMPARISON:  CT from 06/22/2021. FINDINGS: Segmentation: Standard. Lowest well-formed disc space labeled the L5-S1 level. Alignment:  Mild levoscoliosis.  4 mm anterolisthesis of L4 on L5. Vertebrae: Large infiltrative and destructive metastatic lesion involving the right greater than left posterior elements at L2 through L4 again seen. Although exact measurements are difficult, mass measures approximately 6.1 x 10.6 x 7.0 cm in greatest dimensions. Lesion involves and largely obliterates the right greater than left posterior elements of L3 and L4. Additional superior extension to involve the posterior elements of L2 noted as well. Extraosseous extension into the adjacent right and posterior paraspinous musculature. Epidural extension into the right greater than left epidural space at the levels of L3 and L4, greatest at L3 where there is resultant severe spinal  stenosis. Extension to involve the right L2 and L3 neural foramina noted as well. Involvement of the right greater than left L3 and L4 vertebral bodies. Associated pathologic fracture at L3 with mild 25% central height loss. No visible significant bony retropulsion. Vertebral body height otherwise maintained. No other metastatic implants within the lumbar spine. Conus medullaris and cauda equina: Conus extends to the L1 level. Conus medullaris within normal limits. Paraspinal and  other soft tissues: Paraspinous tumor extending from L2 through L4 as above. Paraspinous soft tissues demonstrate no other acute finding. 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 Disc levels: L1-2:  Negative interspace.  Mild facet hypertrophy.  No stenosis. L2-3: Disc desiccation with mild disc bulge, eccentric to the right. Moderate bilateral facet hypertrophy. Infiltrative tumor partially involves the right L2-3 facet and adjacent posterior paraspinous musculature. Tumor extends to partially involve the right neural foramen and epidural space. Resultant moderate spinal stenosis with right greater than left lateral recess narrowing. Mild to moderate right foraminal stenosis. Left neural foramina remains patent. L3-4: Extensive tumor involvement involving the right pedicle and posterior elements of L3, with partial involvement of the L3 vertebral body itself. Associated pathologic fracture with up to 25% central height loss without significant bony retropulsion. Extensive epidural involvement involving the right and posterior epidural space (series 11, image 20). Resultant severe canal with bilateral subarticular stenosis, right worse than left. Thecal sac is markedly narrowed measuring approximately 4 mm in AP diameter at its most narrow point. Tumor extends to involve the right neural foramen with resultant moderate to severe right L3 foraminal stenosis. Mild to moderate left foraminal narrowing noted as well related to disc bulge and underlying facet disease. L4-5: 4 mm anterolisthesis with mild disc bulge and disc desiccation. Severe bilateral facet arthrosis. Tumor centered at the L3 level extends inferiorly to partially involve the posterior elements of L4. Small amount of epidural extension into the right posterior epidural space at this level (series 2, image 11). Resultant mild-to-moderate canal with bilateral subarticular stenosis. Foramina remain patent. L5-S1: Disc desiccation without significant disc bulge.  Mild facet hypertrophy. No canal or lateral recess stenosis. Foramina remain patent. IMPRESSION: 1. Large infiltrative and destructive metastatic lesion centered about the right greater than left posterior elements of L2 through L4 as above. Tumor extension into the epidural space at the L3 and L4 levels, greatest at L3 where there is resultant severe spinal stenosis and near complete effacement of the thecal sac. Emergent neuro surgical and/or radiation oncology consultation recommended. 2. Additional tumor extension into the right L2 and L3 neural foramina. Resultant mild right L2 foraminal stenosis, with moderate to severe right L3 foraminal narrowing. 3. Associated pathologic fracture at L3 with up to 25% height loss without significant bony retropulsion. Electronically Signed   By: Jeannine Boga M.D.   On: 07/18/2021 23:43   DG Chest Portable 1 View  Result Date: 08/03/2021 CLINICAL DATA:  Evaluate for pneumonia EXAM: PORTABLE CHEST 1 VIEW COMPARISON:  March 15, 2020 FINDINGS: The heart size and mediastinal contours are within normal limits. Mild increased pulmonary interstitium is identified bilaterally. Mild hazy ground-glass opacities identified in the right lung base. There is no pleural effusion. The visualized skeletal structures are unremarkable. IMPRESSION: Mild increased pulmonary interstitium identified bilaterally, this can be seen in mild edema or viral pneumonitis. Mild hazy ground-glass opacities identified in the right lung base, superimposed pneumonia is not excluded. Electronically Signed   By: Abelardo Diesel M.D.   On: 07/28/2021 16:57   ECHOCARDIOGRAM COMPLETE  Result Date: 07/23/2021    ECHOCARDIOGRAM REPORT   Patient Name:   James Massey Date of Exam: 07/23/2021 Medical Rec #:  [id number]         Height:       68.0 in Accession #:    [id number]        Weight:       180.0 lb Date of Birth:  May 11, 1946         BSA:          1.954 m Patient Age:    59 years          BP:            88/61 mmHg Patient Gender: M                 HR:           100 bpm. Exam Location:  Inpatient Procedure: 2D Echo, Cardiac Doppler and Color Doppler Indications:    NSTEMI  History:        Patient has prior history of Echocardiogram examinations, most                 recent 04/02/2020. Signs/Symptoms:Murmur. GERD / HLD.  Sonographer:    Beryle Beams Referring Phys: [id number] [place]  1. Left ventricular ejection fraction, by estimation, is 65 to 70%. The left ventricle has normal function. The left ventricle has no regional wall motion abnormalities. There is mild concentric left ventricular hypertrophy. Left ventricular diastolic parameters were normal.  2. Right ventricular systolic function is normal. The right ventricular size is normal. Tricuspid regurgitation signal is inadequate for assessing PA pressure.  3. The mitral valve is grossly normal. Trivial mitral valve regurgitation. No evidence of mitral stenosis.  4. The aortic valve is tricuspid. There is mild calcification of the aortic valve. There is mild thickening of the aortic valve. Aortic valve regurgitation is not visualized. Mild aortic valve stenosis. Aortic valve mean gradient measures 15.7 mmHg. Aortic valve Vmax measures 2.71 m/s.  5. The inferior vena cava is normal in size with greater than 50% respiratory variability, suggesting right atrial pressure of 3 mmHg. FINDINGS  Left Ventricle: Left ventricular ejection fraction, by estimation, is 65 to 70%. The left ventricle has normal function. The left ventricle has no regional wall motion abnormalities. The left ventricular internal cavity size was normal in size. There is  mild concentric left ventricular hypertrophy. Left ventricular diastolic parameters were normal. Right Ventricle: The right ventricular size is normal. No increase in right ventricular wall thickness. Right ventricular systolic function is normal. Tricuspid regurgitation signal is inadequate for  assessing PA pressure. Left Atrium: Left atrial size was normal in size. Right Atrium: Right atrial size was normal in size. Pericardium: There is no evidence of pericardial effusion. Mitral Valve: The mitral valve is grossly normal. Trivial mitral valve regurgitation. No evidence of mitral valve stenosis. Tricuspid Valve: The tricuspid valve is grossly normal. Tricuspid valve regurgitation is trivial. No evidence of tricuspid stenosis. Aortic Valve: The aortic valve is tricuspid. There is mild calcification of the aortic valve. There is mild thickening of the aortic valve. Aortic valve regurgitation is not visualized. Mild aortic stenosis is present. Aortic valve mean gradient measures  15.7 mmHg. Aortic valve peak gradient measures 29.5 mmHg. Aortic valve area, by VTI measures 1.78 cm. Pulmonic Valve: The pulmonic valve was grossly normal. Pulmonic valve regurgitation is not visualized. No evidence of pulmonic stenosis. Aorta: The aortic root and ascending aorta  are structurally normal, with no evidence of dilitation. Venous: The inferior vena cava is normal in size with greater than 50% respiratory variability, suggesting right atrial pressure of 3 mmHg. IAS/Shunts: The atrial septum is grossly normal.  LEFT VENTRICLE PLAX 2D LVIDd:         3.20 cm     Diastology LVIDs:         2.10 cm     LV e' medial:    8.21 cm/s LV PW:         1.10 cm     LV E/e' medial:  11.0 LV IVS:        1.20 cm     LV e' lateral:   9.70 cm/s LVOT diam:     2.20 cm     LV E/e' lateral: 9.3 LV SV:         86 LV SV Index:   44 LVOT Area:     3.80 cm  LV Volumes (MOD) LV vol d, MOD A4C: 81.0 ml LV vol s, MOD A2C: 74.8 ml LV vol s, MOD A4C: 34.9 ml LV SV MOD A4C:     81.0 ml RIGHT VENTRICLE RV S prime:     24.80 cm/s TAPSE (M-mode): 1.8 cm LEFT ATRIUM           Index        RIGHT ATRIUM           Index LA diam:      4.20 cm 2.15 cm/m   RA Area:     11.70 cm LA Vol (A2C): 91.3 ml 46.72 ml/m  RA Volume:   21.30 ml  10.90 ml/m LA Vol  (A4C): 44.0 ml 22.52 ml/m  AORTIC VALVE                     PULMONIC VALVE AV Area (Vmax):    1.68 cm      PV Vmax:       0.85 m/s AV Area (Vmean):   1.63 cm      PV Vmean:      58.400 cm/s AV Area (VTI):     1.78 cm      PV VTI:        0.176 m AV Vmax:           271.47 cm/s   PV Peak grad:  2.9 mmHg AV Vmean:          179.021 cm/s  PV Mean grad:  2.0 mmHg AV VTI:            0.483 m AV Peak Grad:      29.5 mmHg AV Mean Grad:      15.7 mmHg LVOT Vmax:         120.00 cm/s LVOT Vmean:        76.600 cm/s LVOT VTI:          0.226 m LVOT/AV VTI ratio: 0.47  AORTA Ao Root diam: 2.90 cm Ao Asc diam:  3.20 cm MITRAL VALVE MV Area (PHT): 3.91 cm    SHUNTS MV Decel Time: 194 msec    Systemic VTI:  0.23 m MV E velocity: 90.40 cm/s  Systemic Diam: 2.20 cm MV A velocity: 50.10 cm/s MV E/A ratio:  1.80 Eleonore Chiquito MD Electronically signed by Eleonore Chiquito MD Signature Date/Time: 07/23/2021/3:01:19 PM    Final         Scheduled Meds:  allopurinol  300 mg Oral QHS   aspirin EC  81 mg Oral Daily   atorvastatin  40 mg Oral QHS   calcium-vitamin D  2 tablet Oral BID WC   feeding supplement  237 mL Oral TID BM   heparin injection (subcutaneous)  5,000 Units Subcutaneous Q8H   levothyroxine  50 mcg Oral QAC breakfast   magnesium oxide  400 mg Oral BID   metoprolol tartrate  12.5 mg Oral BID   multivitamin with minerals  2 tablet Oral Daily   pantoprazole  40 mg Oral BID   Continuous Infusions:  lactated ringers 75 mL/hr at 07/24/21 0501     LOS: 2 days     Cordelia Poche, MD [facility] 07/24/2021, 4:10 PM  If 7PM-7AM, please contact night-coverage [email or web address]

## 2021-07-24 NOTE — Assessment & Plan Note (Addendum)
Unknown etiology. CT head negative. Patient currently oriented to person and place. No evidence of infection. CT chest without pneumonia. Urinalysis is clear. Seems stable, although wife thinks this has worsened slightly.

## 2021-07-24 NOTE — NC FL2 (Signed)
[place] LEVEL OF CARE SCREENING TOOL     IDENTIFICATION  Patient Name: James Massey Birthdate: 08-22-1945 Sex: male Admission Date (Current Location): 07/08/2021  [facility] [place] Number:  Herbalist and Address:  [facility],  [address] [place], [place]      Provider Number: [id number]  Attending Physician Name and Address:  Mariel Aloe, MD  Relative Name and Phone Number:       Current Level of Care: Hospital Recommended Level of Care: [place] Prior Approval Number:    Date Approved/Denied:   PASRR Number: [id number]  Discharge Plan: SNF    Current Diagnoses: Patient Active Problem List   Diagnosis Date Noted   Demand ischemia ([place]) 07/26/2021   Foot drop, right 07/14/2021   CKD (chronic kidney disease) stage 3, GFR 30-59 ml/min (HCC) 07/07/2021   B12 deficiency 01/16/2021   Depression 01/16/2021   Hypoalbuminemia 01/16/2021   Generalized weakness 01/16/2021   Hypothyroidism (acquired) 01/13/2021   Altered mental status 87/68/1157   Acute metabolic encephalopathy 26/20/3559   Hypokalemia 01/09/2021   Hypomagnesemia 01/09/2021   Hypocalcemia 01/09/2021   Primary malignant neoplasm of kidney 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 to other site [facility]) 09/05/2020   Goals of care, counseling/discussion 09/05/2020   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 ([place]) 01/31/2020   History of right nephrectomy 08/13/2018   Renal mass, left 05/31/2018   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   Barrett's esophagus 08/30/2015   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    Orientation RESPIRATION BLADDER Height & Weight     Self, Time, Situation, Place  Normal Continent Weight: 81.6 kg Height:  5\' 8"  (172.7 cm)  BEHAVIORAL SYMPTOMS/MOOD NEUROLOGICAL BOWEL NUTRITION STATUS      Continent Diet (regular)  AMBULATORY STATUS COMMUNICATION OF NEEDS Skin   Extensive Assist Verbally Normal                       Personal  Care Assistance Level of Assistance  Bathing, Feeding, Dressing Bathing Assistance: Limited assistance Feeding assistance: Limited assistance Dressing Assistance: Limited assistance     Functional Limitations Info  Sight, Hearing, Speech Sight Info: Adequate Hearing Info: Adequate Speech Info: Adequate    SPECIAL CARE FACTORS FREQUENCY  PT (By licensed PT), OT (By licensed OT)     PT Frequency: 5 x weekly OT Frequency: 5 x weekly            Contractures Contractures Info: Not present    Additional Factors Info  Code Status Code Status Info: full             Current Medications (07/24/2021):  This is the current hospital active medication list Current Facility-Administered Medications  Medication Dose Route Frequency Provider Last Rate Last Admin   acetaminophen (TYLENOL) tablet 650 mg  650 mg Oral Q6H PRN Chotiner, Yevonne Aline, MD       Or   acetaminophen (TYLENOL) suppository 650 mg  650 mg Rectal Q6H PRN Chotiner, Yevonne Aline, MD       allopurinol (ZYLOPRIM) tablet 300 mg  300 mg Oral QHS Chotiner, Yevonne Aline, MD   300 mg at 07/23/21 2233   aspirin EC tablet 81 mg  81 mg Oral Daily Chotiner, Yevonne Aline, MD   81 mg at 07/24/21 1023   atorvastatin (LIPITOR) tablet 40 mg  40 mg Oral QHS Chotiner, Yevonne Aline, MD   40 mg at 07/23/21 2233   calcium-vitamin D (OSCAL WITH D) 500-5  MG-MCG per tablet 2 tablet  2 tablet Oral BID WC Chotiner, Yevonne Aline, MD   2 tablet at 07/24/21 0900   diazepam (VALIUM) tablet 5 mg  5 mg Oral Q8H PRN Chotiner, Yevonne Aline, MD   5 mg at 07/23/21 1325   feeding supplement (ENSURE ENLIVE / ENSURE PLUS) liquid 237 mL  237 mL Oral TID BM Little Ishikawa, MD   237 mL at 07/24/21 1027   heparin injection 5,000 Units  5,000 Units Subcutaneous Q8H Chandrasekhar, Mahesh A, MD       lactated ringers infusion   Intravenous Continuous Chotiner, Yevonne Aline, MD 75 mL/hr at 07/24/21 0501 New Bag at 07/24/21 0501   levothyroxine (SYNTHROID) tablet 50 mcg  50 mcg Oral  QAC breakfast Chotiner, Yevonne Aline, MD   50 mcg at 07/24/21 0500   magnesium oxide (MAG-OX) tablet 400 mg  400 mg Oral BID Chotiner, Yevonne Aline, MD   400 mg at 07/24/21 1024   metoprolol tartrate (LOPRESSOR) tablet 12.5 mg  12.5 mg Oral BID Chandrasekhar, Mahesh A, MD       multivitamin with minerals tablet 2 tablet  2 tablet Oral Daily Chotiner, Yevonne Aline, MD   2 tablet at 07/24/21 1023   ondansetron (ZOFRAN) tablet 4 mg  4 mg Oral Q6H PRN Chotiner, Yevonne Aline, MD       Or   ondansetron (ZOFRAN) injection 4 mg  4 mg Intravenous Q6H PRN Chotiner, Yevonne Aline, MD       pantoprazole (PROTONIX) EC tablet 40 mg  40 mg Oral BID Chotiner, Yevonne Aline, MD   40 mg at 07/24/21 1023   senna-docusate (Senokot-S) tablet 1 tablet  1 tablet Oral QHS PRN Chotiner, Yevonne Aline, MD       traMADol Veatrice Bourbon) tablet 50 mg  50 mg Oral Q6H PRN Chotiner, Yevonne Aline, MD   50 mg at 07/24/21 0457     Discharge Medications: Please see discharge summary for a list of discharge medications.  Relevant Imaging Results:  Relevant Lab Results:   Additional Information SS# [id number]  Leeroy Cha, RN

## 2021-07-24 NOTE — Progress Notes (Signed)
End of shift  Some confusion today regarding radiation schedule.  Wife refused until she spoke with the Dr Tammi Klippel.    Anticipate pt going to radiation the 19th, 20, 21, 22 and then discharged to SNF.   Pt received tramadol for pain in his lower back/hip/sacral area.

## 2021-07-24 NOTE — Assessment & Plan Note (Signed)
Initial concern for NSTEMI. Heparin IV started. Cardiology consulted. Troponin peak of 174 with downtrend. Transthoracic Echocardiogram without wall motion abnormalities or LV dysfunction. Cardiology now signed off.

## 2021-07-24 NOTE — Assessment & Plan Note (Addendum)
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 PT/OT recommending SNF. 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

## 2021-07-25 ENCOUNTER — Ambulatory Visit: Payer: Medicare Other

## 2021-07-25 ENCOUNTER — Encounter: Payer: Self-pay | Admitting: Radiation Oncology

## 2021-07-25 ENCOUNTER — Ambulatory Visit
Admission: RE | Admit: 2021-07-25 | Discharge: 2021-07-25 | Disposition: A | Discharge status: Home / Self Care | Payer: Medicare Other | Source: Ambulatory Visit | Attending: Radiation Oncology | Admitting: Radiation Oncology

## 2021-07-25 DIAGNOSIS — N1831 Chronic kidney disease, stage 3a: Secondary | ICD-10-CM | POA: Diagnosis not present

## 2021-07-25 DIAGNOSIS — I248 Other forms of acute ischemic heart disease: Secondary | ICD-10-CM | POA: Diagnosis not present

## 2021-07-25 DIAGNOSIS — M21371 Foot drop, right foot: Secondary | ICD-10-CM | POA: Diagnosis not present

## 2021-07-25 DIAGNOSIS — C641 Malignant neoplasm of right kidney, except renal pelvis: Secondary | ICD-10-CM | POA: Diagnosis not present

## 2021-07-25 LAB — CBC
HCT: 33.2 % — ABNORMAL LOW (ref 39.0–52.0)
Hemoglobin: 11.2 g/dL — ABNORMAL LOW (ref 13.0–17.0)
MCH: 31.5 pg (ref 26.0–34.0)
MCHC: 33.7 g/dL (ref 30.0–36.0)
MCV: 93.3 fL (ref 80.0–100.0)
Platelets: 251 10*3/uL (ref 150–400)
RBC: 3.56 MIL/uL — ABNORMAL LOW (ref 4.22–5.81)
RDW: 13.2 % (ref 11.5–15.5)
WBC: 8.8 10*3/uL (ref 4.0–10.5)
nRBC: 0 % (ref 0.0–0.2)

## 2021-07-25 MED ORDER — SODIUM CHLORIDE 0.9 % IV SOLN: INTRAVENOUS | Status: DC

## 2021-07-25 NOTE — Progress Notes (Signed)
OT Cancellation Note  Patient Details Name: James Massey MRN: [id number] DOB: 03/16/1946   Cancelled Treatment:    Reason Eval/Treat Not Completed: Other (comment). Patient declines therapy this morning via circuitous conversation. Will f/u as able.  James Massey 07/25/2021, 9:23 AM

## 2021-07-25 NOTE — Progress Notes (Signed)
End of Shift  Pt had 2 BMs today.   Pt had radiation treatment today.   Anticipate a radiation treatment tomorrow at approx 1500 and per pt's wife, discharge home on Saturday.

## 2021-07-25 NOTE — Progress Notes (Signed)
Physical Therapy Treatment Patient Details Name: James Massey MRN: [id number] DOB: [birth year] Today's Date: 07/25/2021   History of Present Illness Patient is 76 y.o. male who presents with complaint of aggressively worsening weakness with altered mental status and urinary frequency. In ED EKG revealed NSTEMI with elevated troponin. Patient has metastatic renal cancer with known metastatic lesion to L3. Lumbar spine MRI showing thecal sac impingement and spinal nerve root compression causing a Rt foot drop. Additional PMH significant for anxiety, GERD, HLD,    PT Comments    Patient making progress with mobility and required less encouragement to mobilize with wife at bedside. Pt required Mod-Max assist +2 for bed mobility and Min +2 for safety with sit<>stand using Stedy. Pt completed 4 stands total but declined transfer to recliner due to back pain. EOS pt placed in chair position in bed and encouraged to move to recliner with nursing staff for meals using Stedy. Rt quad activation limited today sitting EOB but no buckling in standing. Acute PT will continue to progress pt as able. Continue to recommend SNF rehab for therapy follow prior to return home.    Recommendations for follow up therapy are one component of a multi-disciplinary discharge planning process, led by the attending physician.  Recommendations may be updated based on patient status, additional functional criteria and insurance authorization.  Follow Up Recommendations  Skilled nursing-short term rehab (<3 hours/day)     Assistance Recommended at Discharge Frequent or constant Supervision/Assistance  Patient can return home with the following Two people to help with bathing/dressing/bathroom;Two people to help with walking and/or transfers;Direct supervision/assist for medications management;Assistance with feeding;Assistance with cooking/housework;Assist for transportation;Help with stairs or ramp for entrance;Direct  supervision/assist for financial management   Equipment Recommendations  Rollator (4 wheels) (defer to SNF)    Recommendations for Other Services       Precautions / Restrictions Precautions Precautions: Fall Precaution Comments: Rt LE pain and Rt foot drop. Restrictions Weight Bearing Restrictions: No     Mobility  Bed Mobility Overal bed mobility: Needs Assistance Bed Mobility: Sit to Supine, Rolling, Sidelying to Sit Rolling: Mod assist Sidelying to sit: Mod assist, +2 for safety/equipment, Max assist, HOB elevated   Sit to supine: +2 for physical assistance, Max assist, +2 for safety/equipment   General bed mobility comments: Mod assist to facilitate reaching Lt UE to bed rail and MAx assist to bring LE's in hooklying to roll to side. Mod-Max assist +2 to bring LE's off EOB and raise trunk upright. EOS pt required Max assist for sequencing and to control lowering trunk and raising LE's onto bed.    Transfers Overall transfer level: Needs assistance   Transfers: Sit to/from Stand, Bed to chair/wheelchair/BSC Sit to Stand: Min assist, +2 safety/equipment, +2 physical assistance, From elevated surface           General transfer comment: patient required Min assist +2 for safety to rise from EOB to [facility]. Pt completed 3x from EOB and 1x from Stedy paddles. pt c/o back pain and declined transfer to recliner. Transfer via Lift Equipment: Stedy  Ambulation/Gait                   Stairs             Wheelchair Mobility    Modified Rankin (Stroke Patients Only)       Balance Overall balance assessment: Needs assistance Sitting-balance support: Feet supported, Bilateral upper extremity supported Sitting balance-Leahy Scale: Fair  Standing balance support: Reliant on assistive device for balance, Bilateral upper extremity supported Standing balance-Leahy Scale: Poor                              Cognition Arousal/Alertness:  Awake/alert Behavior During Therapy: WFL for tasks assessed/performed Overall Cognitive Status: Impaired/Different from baseline Area of Impairment: Memory, Following commands, Problem solving, Awareness, Attention                   Current Attention Level: Focused Memory: Decreased short-term memory Following Commands: Follows one step commands consistently, Follows multi-step commands inconsistently, Follows multi-step commands with increased time   Awareness: Anticipatory Problem Solving: Slow processing, Difficulty sequencing, Requires verbal cues          Exercises      General Comments        Pertinent Vitals/Pain Pain Assessment Pain Assessment: Faces Faces Pain Scale: Hurts even more Pain Location: Rt hip Pain Descriptors / Indicators: Discomfort, Constant, Shooting Pain Intervention(s): Limited activity within patient's tolerance, Monitored during session, Repositioned    Home Living                          Prior Function            PT Goals (current goals can now be found in the care plan section) Acute Rehab PT Goals PT Goal Formulation: With family Time For Goal Achievement: 08/06/21 Potential to Achieve Goals: Fair Progress towards PT goals: Progressing toward goals    Frequency    Min 3X/week      PT Plan Current plan remains appropriate    Co-evaluation PT/OT/SLP Co-Evaluation/Treatment: Yes Reason for Co-Treatment: To address functional/ADL transfers;For patient/therapist safety PT goals addressed during session: Mobility/safety with mobility;Balance;Proper use of DME OT goals addressed during session: ADL's and self-care;Strengthening/ROM      AM-PAC PT "6 Clicks" Mobility   Outcome Measure  Help needed turning from your back to your side while in a flat bed without using bedrails?: A Lot Help needed moving from lying on your back to sitting on the side of a flat bed without using bedrails?: A Lot Help needed  moving to and from a bed to a chair (including a wheelchair)?: Total Help needed standing up from a chair using your arms (e.g., wheelchair or bedside chair)?: A Lot Help needed to walk in hospital room?: Total Help needed climbing 3-5 steps with a railing? : Total 6 Click Score: 9    End of Session Equipment Utilized During Treatment: Gait belt Activity Tolerance: Patient tolerated treatment well;Patient limited by pain Patient left: in bed;with call bell/phone within reach;Other (comment);with bed alarm set;with family/visitor present (chair position) Nurse Communication: Mobility status PT Visit Diagnosis: Unsteadiness on feet (R26.81);Other abnormalities of gait and mobility (R26.89);Muscle weakness (generalized) (M62.81);History of falling (Z91.81);Difficulty in walking, not elsewhere classified (R26.2);Other symptoms and signs involving the nervous system (R29.898)     Time: 3094-0768 PT Time Calculation (min) (ACUTE ONLY): 28 min  Charges:  $Therapeutic Activity: 8-22 mins                     Verner Mould, DPT Acute Rehabilitation Services Office [phone] Pager [phone]    Jacques Navy 07/25/2021, 3:08 PM

## 2021-07-25 NOTE — Progress Notes (Signed)
PROGRESS NOTE    Eann Cleland  SWH:[id number] DOB: 09-15-1945 DOA: 07/26/2021 PCP: Myrtis Hopping., MD   Brief Narrative: Mick Tanguma is a 76 y.o. 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 Patient presented secondary to worsening weakness, altered mental status and increased urinary frequency. He was found to have right foot drop with inability to get up from chair/bed. MRI lumbar spine significant for metastatic disease with associated L3 cord compression. Radiation oncology consulted for radiation therapy.   Assessment & Plan:   * Primary malignant neoplasm of kidney 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 to other site [facility])- (present on admission) Patient with a history of right nephrectomy. Patient follows with Dr. Alen Blew as an outpatient.  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 Radiation oncology consulted and recommended emergent stereotactic boxy radiation starting 1/17 with plan for a total of 5 treatments. Discussed with neurosurgery (1/18) with recommendation for no surgical intervention at this time.  Hypercalcemia Likely secondary to malignancy. Mild. -Normal saline IV fluids  Chronic kidney disease, stage 3a (HCC) Stable. Increased urine output. Urine is dilute. Urine output possibly related to kidney disease vs hypercalcemia.  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 PT recommending SNF. 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  Demand ischemia ([place])- (present on admission) Initial concern for NSTEMI. Heparin IV started. Cardiology consulted. Troponin peak of 174 with downtrend. Transthoracic Echocardiogram without wall motion abnormalities or LV dysfunction. Cardiology now signed  off.  Altered mental status- (present on admission) Unknown etiology. CT head negative. Patient currently oriented to person and place. No evidence of infection. CT chest without pneumonia. Urinalysis is clear. Stable.     DVT prophylaxis: Heparin subq Code Status:   Code Status: Full Code Family Communication: None at bedside Disposition Plan: Discharge to SNF. Anticipate medical readiness by 1/21 after receiving radiation therapy this week and ability to continue receiving radiation therapy next week from SNF.   Consultants:  Radiation oncology Neurosurgery  Procedures:  Radiation therapy  Antimicrobials: None    Subjective: No issues noted overnight.  Objective: Vitals:   07/24/21 1354 07/24/21 1509 07/24/21 1936 07/25/21 0458  BP: (!) 103/57 95/62 104/79 (!) 141/88  Pulse: 88  98 (!) 102  Resp: 18  18 19   Temp: 98.7 F (37.1 C)  97.6 F (36.4 C) 97.9 F (36.6 C)  TempSrc: Oral  Oral Oral  SpO2: 96%  95% 96%  Weight:      Height:        Intake/Output Summary (Last 24 hours) at 07/25/2021 1355 Last data filed at 07/25/2021 1300 Gross per 24 hour  Intake 1125.9 ml  Output 900 ml  Net 225.9 ml    Filed Weights   07/23/2021 1420 07/28/2021 1426  Weight: 81.6 kg 81.6 kg    Examination:  General exam: Appears calm and comfortable Respiratory system: Respiratory effort normal.    Data Reviewed: I have personally reviewed following labs and imaging studies  CBC Lab Results  Component Value Date   WBC 8.8 07/25/2021   RBC 3.56 (L) 07/25/2021   HGB 11.2 (L) 07/25/2021   HCT 33.2 (L) 07/25/2021   MCV 93.3 07/25/2021   MCH 31.5 07/25/2021   PLT 251 07/25/2021   MCHC 33.7 07/25/2021   RDW 13.2  07/25/2021   LYMPHSABS 0.9 07/18/2021   MONOABS 1.1 (H) 07/13/2021   EOSABS 0.1 08/02/2021   BASOSABS 0.1 32/44/0102     Last metabolic panel Lab Results  Component Value Date   NA 131 (L) 07/23/2021   K 3.8 07/23/2021   CL 97 (L) 07/23/2021   CO2 27  07/23/2021   BUN 20 07/23/2021   CREATININE 1.40 (H) 07/23/2021   GLUCOSE 110 (H) 07/23/2021   GFRNONAA 52 (L) 07/23/2021   GFRAA 48 (L) 02/06/2020   CALCIUM 11.1 (H) 07/23/2021   PHOS 3.6 01/10/2021   PHOS 3.6 01/10/2021   PROT 6.5 07/21/2021   ALBUMIN 3.0 (L) 07/20/2021   BILITOT 0.7 07/14/2021   ALKPHOS 89 07/28/2021   AST 34 07/16/2021   ALT 42 07/15/2021   ANIONGAP 7 07/23/2021    CBG (last 3)  No results for input(s): GLUCAP in the last 72 hours.   GFR: Estimated Creatinine Clearance: 44.1 mL/min (A) (by C-G formula based on SCr of 1.4 mg/dL (H)).  Coagulation Profile: Recent Labs  Lab 07/23/21 0435  INR 1.1     Recent Results (from the past 240 hour(s))  Resp Panel by RT-PCR (Flu A&B, Covid) Nasopharyngeal Swab     Status: None   Collection Time: 07/23/2021  5:01 PM   Specimen: Nasopharyngeal Swab; Nasopharyngeal(NP) swabs in vial transport medium  Result Value Ref Range Status   SARS Coronavirus 2 by RT PCR NEGATIVE NEGATIVE Final    Comment: (NOTE) SARS-CoV-2 target nucleic acids are NOT DETECTED.  The SARS-CoV-2 RNA is generally detectable in upper respiratory specimens during the acute phase of infection. The lowest concentration of SARS-CoV-2 viral copies this assay can detect is 138 copies/mL. A negative result does not preclude SARS-Cov-2 infection and should not be used as the sole basis for treatment or other patient management decisions. A negative result may occur with  improper specimen collection/handling, submission of specimen other than nasopharyngeal swab, presence of viral mutation(s) within the areas targeted by this assay, and inadequate number of viral copies(<138 copies/mL). A negative result must be combined with clinical observations, patient history, and epidemiological information. The expected result is Negative.  Fact Sheet for Patients:  EntrepreneurPulse.com.au  Fact Sheet for Healthcare Providers:   IncredibleEmployment.be  This test is no t yet approved or cleared by the [place] FDA and  has been authorized for detection and/or diagnosis of SARS-CoV-2 by FDA under an Emergency Use Authorization (EUA). This EUA will remain  in effect (meaning this test can be used) for the duration of the COVID-19 declaration under Section 564(b)(1) of the Act, 21 U.S.C.section 360bbb-3(b)(1), unless the authorization is terminated  or revoked sooner.       Influenza A by PCR NEGATIVE NEGATIVE Final   Influenza B by PCR NEGATIVE NEGATIVE Final    Comment: (NOTE) The Xpert Xpress SARS-CoV-2/FLU/RSV plus assay is intended as an aid in the diagnosis of influenza from Nasopharyngeal swab specimens and should not be used as a sole basis for treatment. Nasal washings and aspirates are unacceptable for Xpert Xpress SARS-CoV-2/FLU/RSV testing.  Fact Sheet for Patients: EntrepreneurPulse.com.au  Fact Sheet for Healthcare Providers: IncredibleEmployment.be  This test is not yet approved or cleared by the [place] FDA and has been authorized for detection and/or diagnosis of SARS-CoV-2 by FDA under an Emergency Use Authorization (EUA). This EUA will remain in effect (meaning this test can be used) for the duration of the COVID-19 declaration under Section 564(b)(1) of the Act, 21 U.S.C.  section 360bbb-3(b)(1), unless the authorization is terminated or revoked.  Performed at [facility], [place] [address] [place], [place] [address]          Radiology Studies: ECHOCARDIOGRAM COMPLETE  Result Date: 07/23/2021    ECHOCARDIOGRAM REPORT   Patient Name:   Guinn WILMAR PRABHAKAR Date of Exam: 07/23/2021 Medical Rec #:  [id number]         Height:       68.0 in Accession #:    [id number]        Weight:       180.0 lb Date of Birth:  1946/03/23         BSA:          1.954 m Patient Age:    65 years          BP:           88/61  mmHg Patient Gender: M                 HR:           100 bpm. Exam Location:  Inpatient Procedure: 2D Echo, Cardiac Doppler and Color Doppler Indications:    NSTEMI  History:        Patient has prior history of Echocardiogram examinations, most                 recent 04/02/2020. Signs/Symptoms:Murmur. GERD / HLD.  Sonographer:    Beryle Beams Referring Phys: [id number] [place]  1. Left ventricular ejection fraction, by estimation, is 65 to 70%. The left ventricle has normal function. The left ventricle has no regional wall motion abnormalities. There is mild concentric left ventricular hypertrophy. Left ventricular diastolic parameters were normal.  2. Right ventricular systolic function is normal. The right ventricular size is normal. Tricuspid regurgitation signal is inadequate for assessing PA pressure.  3. The mitral valve is grossly normal. Trivial mitral valve regurgitation. No evidence of mitral stenosis.  4. The aortic valve is tricuspid. There is mild calcification of the aortic valve. There is mild thickening of the aortic valve. Aortic valve regurgitation is not visualized. Mild aortic valve stenosis. Aortic valve mean gradient measures 15.7 mmHg. Aortic valve Vmax measures 2.71 m/s.  5. The inferior vena cava is normal in size with greater than 50% respiratory variability, suggesting right atrial pressure of 3 mmHg. FINDINGS  Left Ventricle: Left ventricular ejection fraction, by estimation, is 65 to 70%. The left ventricle has normal function. The left ventricle has no regional wall motion abnormalities. The left ventricular internal cavity size was normal in size. There is  mild concentric left ventricular hypertrophy. Left ventricular diastolic parameters were normal. Right Ventricle: The right ventricular size is normal. No increase in right ventricular wall thickness. Right ventricular systolic function is normal. Tricuspid regurgitation signal is inadequate for assessing PA  pressure. Left Atrium: Left atrial size was normal in size. Right Atrium: Right atrial size was normal in size. Pericardium: There is no evidence of pericardial effusion. Mitral Valve: The mitral valve is grossly normal. Trivial mitral valve regurgitation. No evidence of mitral valve stenosis. Tricuspid Valve: The tricuspid valve is grossly normal. Tricuspid valve regurgitation is trivial. No evidence of tricuspid stenosis. Aortic Valve: The aortic valve is tricuspid. There is mild calcification of the aortic valve. There is mild thickening of the aortic valve. Aortic valve regurgitation is not visualized. Mild aortic stenosis is present. Aortic valve mean gradient measures  15.7 mmHg. Aortic valve peak gradient  measures 29.5 mmHg. Aortic valve area, by VTI measures 1.78 cm. Pulmonic Valve: The pulmonic valve was grossly normal. Pulmonic valve regurgitation is not visualized. No evidence of pulmonic stenosis. Aorta: The aortic root and ascending aorta are structurally normal, with no evidence of dilitation. Venous: The inferior vena cava is normal in size with greater than 50% respiratory variability, suggesting right atrial pressure of 3 mmHg. IAS/Shunts: The atrial septum is grossly normal.  LEFT VENTRICLE PLAX 2D LVIDd:         3.20 cm     Diastology LVIDs:         2.10 cm     LV e' medial:    8.21 cm/s LV PW:         1.10 cm     LV E/e' medial:  11.0 LV IVS:        1.20 cm     LV e' lateral:   9.70 cm/s LVOT diam:     2.20 cm     LV E/e' lateral: 9.3 LV SV:         86 LV SV Index:   44 LVOT Area:     3.80 cm  LV Volumes (MOD) LV vol d, MOD A4C: 81.0 ml LV vol s, MOD A2C: 74.8 ml LV vol s, MOD A4C: 34.9 ml LV SV MOD A4C:     81.0 ml RIGHT VENTRICLE RV S prime:     24.80 cm/s TAPSE (M-mode): 1.8 cm LEFT ATRIUM           Index        RIGHT ATRIUM           Index LA diam:      4.20 cm 2.15 cm/m   RA Area:     11.70 cm LA Vol (A2C): 91.3 ml 46.72 ml/m  RA Volume:   21.30 ml  10.90 ml/m LA Vol (A4C): 44.0 ml  22.52 ml/m  AORTIC VALVE                     PULMONIC VALVE AV Area (Vmax):    1.68 cm      PV Vmax:       0.85 m/s AV Area (Vmean):   1.63 cm      PV Vmean:      58.400 cm/s AV Area (VTI):     1.78 cm      PV VTI:        0.176 m AV Vmax:           271.47 cm/s   PV Peak grad:  2.9 mmHg AV Vmean:          179.021 cm/s  PV Mean grad:  2.0 mmHg AV VTI:            0.483 m AV Peak Grad:      29.5 mmHg AV Mean Grad:      15.7 mmHg LVOT Vmax:         120.00 cm/s LVOT Vmean:        76.600 cm/s LVOT VTI:          0.226 m LVOT/AV VTI ratio: 0.47  AORTA Ao Root diam: 2.90 cm Ao Asc diam:  3.20 cm MITRAL VALVE MV Area (PHT): 3.91 cm    SHUNTS MV Decel Time: 194 msec    Systemic VTI:  0.23 m MV E velocity: 90.40 cm/s  Systemic Diam: 2.20 cm MV A velocity: 50.10 cm/s MV E/A ratio:  1.80 [place]  O'Neal MD Electronically signed by Eleonore Chiquito MD Signature Date/Time: 07/23/2021/3:01:19 PM    Final         Scheduled Meds:  allopurinol  300 mg Oral QHS   aspirin EC  81 mg Oral Daily   atorvastatin  40 mg Oral QHS   calcium-vitamin D  2 tablet Oral BID WC   feeding supplement  237 mL Oral TID BM   heparin injection (subcutaneous)  5,000 Units Subcutaneous Q8H   levothyroxine  50 mcg Oral QAC breakfast   magnesium oxide  400 mg Oral BID   multivitamin with minerals  2 tablet Oral Daily   pantoprazole  40 mg Oral BID   Continuous Infusions:  sodium chloride       LOS: 3 days     Cordelia Poche, MD [facility] 07/25/2021, 1:55 PM  If 7PM-7AM, please contact night-coverage [email or web address]

## 2021-07-25 NOTE — TOC Progression Note (Addendum)
Transition of Care [facility]) - Progression Note    Patient Details  Name: Stevens Magwood MRN: [id number] Date of Birth: [birth year]  [facility] of [facility] CM/SW Contact  Leeroy Cha, RN Phone Number: 07/25/2021, 8:02 AM  Clinical Narrative:    DCD is now planned for 012123 Saturday text to [facility] to see if this will be possible.  Will take on 012123, please have wife come on Friday to sign papers Text message sent to wife for her to go to [facility] tomorrow to sign admission papers. Expected Discharge Plan: Home/Self Care Barriers to Discharge: Continued Medical Work up  Expected Discharge Plan and Services Expected Discharge Plan: Home/Self Care   Discharge Planning Services: CM Consult   Living arrangements for the past 2 months: Single Family Home                                       Social Determinants of Health (SDOH) Interventions    Readmission Risk Interventions No flowsheet data found.

## 2021-07-25 NOTE — Evaluation (Signed)
Occupational Therapy Evaluation Patient Details Name: James Massey MRN: [id number] DOB: 1946/05/26 Today's Date: 07/25/2021   History of Present Illness Patient is 76 y.o. male who presents with complaint of aggressively worsening weakness with altered mental status and urinary frequency. In ED EKG revealed NSTEMI with elevated troponin. Patient has metastatic renal cancer with known metastatic lesion to L3. Lumbar spine MRI showing thecal sac impingement and spinal nerve root compression causing a Rt foot drop. Additional PMH significant for anxiety, GERD, HLD,   Clinical Impression   [name] is a 76 year old man admitted to hospital with above medical history and presents with generalized weakness, decreased activity tolerance, impaired balance and pain. He exhibits right foot drop, weakness in RLE and pain in right hip,  Patient needing min assist to stand with stedy but with poor standing tolerance.Patient needing significant assistance with ADLs including max-total assist for LB ADLs and toileting. Patient will benefit from skilled OT services while in hospital to improve deficits and learn compensatory strategies as needed in order to return to PLOF. Recommend short term rehab to maximize functional abilities to reduce caregiver burden prior to return home.     Recommendations for follow up therapy are one component of a multi-disciplinary discharge planning process, led by the attending physician.  Recommendations may be updated based on patient status, additional functional criteria and insurance authorization.   Follow Up Recommendations  Skilled nursing-short term rehab (<3 hours/day)    Assistance Recommended at Discharge Frequent or constant Supervision/Assistance  Patient can return home with the following A lot of help with walking and/or transfers;A lot of help with bathing/dressing/bathroom    Functional Status Assessment  Patient has had a recent decline in their  functional status and demonstrates the ability to make significant improvements in function in a reasonable and predictable amount of time.  Equipment Recommendations  None recommended by OT    Recommendations for Other Services       Precautions / Restrictions Precautions Precautions: Fall Precaution Comments: Rt LE pain and Rt foot drop. Restrictions Weight Bearing Restrictions: No      Mobility Bed Mobility                    Transfers                          Balance Overall balance assessment: Needs assistance Sitting-balance support: Feet supported, Bilateral upper extremity supported Sitting balance-Leahy Scale: Fair     Standing balance support: Reliant on assistive device for balance, Bilateral upper extremity supported Standing balance-Leahy Scale: Poor Standing balance comment: reliant on external device                           ADL either performed or assessed with clinical judgement   ADL Overall ADL's : Needs assistance/impaired Eating/Feeding: Set up;Bed level   Grooming: Sitting;Wash/dry face   Upper Body Bathing: Moderate assistance;Sitting   Lower Body Bathing: Maximal assistance;Bed level   Upper Body Dressing : Moderate assistance;Sitting   Lower Body Dressing: Bed level;Total assistance   Toilet Transfer: Total assistance;BSC/3in1 Toilet Transfer Details (indicate cue type and reason): use of stedy needed for [facility] to transfer [place] and Hygiene: Total assistance;Sitting/lateral lean         General ADL Comments: MOd x 2 to transfer into sitting via log roll - secondary to pain with movement. use of stedy  to stand x 3 with light min assist. Poor standing tolerance and wanting to return bed.     Vision   Vision Assessment?: No apparent visual deficits     Perception     Praxis      Pertinent Vitals/Pain Pain Assessment Pain Assessment: Faces Faces Pain Scale: Hurts even  more Pain Location: Rt hip Pain Descriptors / Indicators: Discomfort, Constant, Shooting Pain Intervention(s): Limited activity within patient's tolerance     Hand Dominance     Extremity/Trunk Assessment Upper Extremity Assessment Upper Extremity Assessment: RUE deficits/detail;LUE deficits/detail RUE Deficits / Details: WFL ROM, 4/5 shoulder strength (has pain), 5/5 bicep, 4-/5 tricep, 5/5 wrist, 5/5 grip RUE Sensation: WNL RUE Coordination: WNL LUE Deficits / Details: WFL ROM, 5/5 strength LUE Sensation: WNL LUE Coordination: WNL   Lower Extremity Assessment Lower Extremity Assessment: Defer to PT evaluation RLE: Unable to fully assess due to pain   Cervical / Trunk Assessment Cervical / Trunk Assessment: Normal   Communication Communication Communication: No difficulties   Cognition Arousal/Alertness: Awake/alert Behavior During Therapy: WFL for tasks assessed/performed Overall Cognitive Status: Impaired/Different from baseline Area of Impairment: Memory, Following commands, Problem solving, Awareness, Attention                   Current Attention Level: Focused Memory: Decreased short-term memory Following Commands: Follows one step commands consistently, Follows multi-step commands inconsistently, Follows multi-step commands with increased time   Awareness: Anticipatory Problem Solving: Slow processing, Difficulty sequencing, Requires verbal cues       General Comments       Exercises     Shoulder Instructions      Home Living Family/patient expects to be discharged to:: Private residence Living Arrangements: Spouse/significant other Available Help at Discharge: Family Type of Home: House Home Access: Ramped entrance     Home Layout: Multi-level;Able to live on main level with bedroom/bathroom Alternate Level Stairs-Number of Steps: they have a chair lift to go upstairs but he can't use it (stopped using when got on walker - 1.5 years ago) has  not been upstairs since summer time this year.         Bathroom Accessibility: No (too small to pivot in)   Home Equipment: BSC/3in1;Standard Walker;Rolling Walker (2 wheels);Rollator (4 wheels);Wheelchair - manual;Hospital bed (rollator (arms need to be an inch taller) too short - may need one ordered)   Additional Comments: Pt started to use RW about 1.5 years ago for mobility and has required a wheelchair starting ~3 weeks ago. 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 does the exs fromOPPT (had to stop bc of chemo/radiation).      Prior Functioning/Environment Prior Level of Function : Needs assist  Cognitive Assist : Mobility (cognitive) Mobility (Cognitive): Intermittent cues   Physical Assist : Mobility (physical);ADLs (physical) Mobility (physical): Bed mobility;Transfers;Gait ADLs (physical): Grooming;Bathing;Dressing;Toileting;IADLs Mobility Comments: Patient started to decline on 12/29 when he noticed Rt foot drop. He had a fall on 07/07/21 and his wife caught him but had to call neighbors to help him off the floor. Pt has been using a wheelchair since then and his wife has had to help him to stand and pivot to wheelchair from bed. He has had several backward falls onto the bed or into the chair during transfers. ADLs Comments: [place], CNA bathes him on those days. sink bath. new guy PT at home - transfers  for wheelchair and standing at sink.        OT Problem List: Decreased strength;Decreased range of motion;Decreased activity tolerance;Impaired balance (sitting and/or standing);Decreased coordination;Decreased cognition;Decreased safety awareness;Decreased knowledge of use of DME or AE;Decreased knowledge of precautions;Impaired tone;Pain      OT Treatment/Interventions: Self-care/ADL training;Therapeutic exercise;DME and/or AE instruction;Patient/family education;Balance training;Therapeutic activities    OT  Goals(Current goals can be found in the care plan section) Acute Rehab OT Goals OT Goal Formulation: Patient unable to participate in goal setting Time For Goal Achievement: 08/15/21 Potential to Achieve Goals: Fair  OT Frequency: Min 2X/week    Co-evaluation   Reason for Co-Treatment: To address functional/ADL transfers;For patient/therapist safety PT goals addressed during session: Mobility/safety with mobility OT goals addressed during session: ADL's and self-care      AM-PAC OT "6 Clicks" Daily Activity     Outcome Measure Help from another person eating meals?: A Little Help from another person taking care of personal grooming?: A Little Help from another person toileting, which includes using toliet, bedpan, or urinal?: Total Help from another person bathing (including washing, rinsing, drying)?: A Lot Help from another person to put on and taking off regular upper body clothing?: A Lot Help from another person to put on and taking off regular lower body clothing?: Total 6 Click Score: 12   End of Session Equipment Utilized During Treatment:  (stedy) Nurse Communication: Mobility status  Activity Tolerance: Patient limited by pain Patient left: in bed;with call bell/phone within reach;with bed alarm set;with family/visitor present  OT Visit Diagnosis: Pain;Muscle weakness (generalized) (M62.81)                Time: 2536-6440 OT Time Calculation (min): 26 min Charges:  OT General Charges $OT Visit: 1 Visit OT Evaluation $OT Eval Low Complexity: 1 Low  Adeliz Tonkinson, OTR/L [place]  Office [phone] Pager: [email or web address] 07/25/2021, 3:19 PM

## 2021-07-25 NOTE — Assessment & Plan Note (Addendum)
Likely secondary to malignancy. Mild. Some improvement of calcium with IV fluids. NS IV fluid bolus given -NS IV fluids at 125 mL/hr

## 2021-07-26 ENCOUNTER — Ambulatory Visit: Payer: Medicare Other

## 2021-07-26 DIAGNOSIS — Z7189 Other specified counseling: Secondary | ICD-10-CM

## 2021-07-26 DIAGNOSIS — C649 Malignant neoplasm of unspecified kidney, except renal pelvis: Secondary | ICD-10-CM

## 2021-07-26 DIAGNOSIS — C7951 Secondary malignant neoplasm of bone: Principal | ICD-10-CM

## 2021-07-26 DIAGNOSIS — R579 Shock, unspecified: Secondary | ICD-10-CM

## 2021-07-26 DIAGNOSIS — I480 Paroxysmal atrial fibrillation: Secondary | ICD-10-CM

## 2021-07-26 DIAGNOSIS — R3589 Other polyuria: Secondary | ICD-10-CM | POA: Diagnosis not present

## 2021-07-26 DIAGNOSIS — N1831 Chronic kidney disease, stage 3a: Secondary | ICD-10-CM | POA: Diagnosis not present

## 2021-07-26 DIAGNOSIS — I4892 Unspecified atrial flutter: Secondary | ICD-10-CM

## 2021-07-26 LAB — CBC
HCT: 34.7 % — ABNORMAL LOW (ref 39.0–52.0)
HCT: 31.9 % — ABNORMAL LOW (ref 39.0–52.0)
Hemoglobin: 11.5 g/dL — ABNORMAL LOW (ref 13.0–17.0)
Hemoglobin: 10.5 g/dL — ABNORMAL LOW (ref 13.0–17.0)
MCH: 31.3 pg (ref 26.0–34.0)
MCH: 31.1 pg (ref 26.0–34.0)
MCHC: 33.1 g/dL (ref 30.0–36.0)
MCHC: 32.9 g/dL (ref 30.0–36.0)
MCV: 94.6 fL (ref 80.0–100.0)
MCV: 94.4 fL (ref 80.0–100.0)
Platelets: 249 10*3/uL (ref 150–400)
Platelets: 277 10*3/uL (ref 150–400)
RBC: 3.67 MIL/uL — ABNORMAL LOW (ref 4.22–5.81)
RBC: 3.38 MIL/uL — ABNORMAL LOW (ref 4.22–5.81)
RDW: 13.2 % (ref 11.5–15.5)
RDW: 13.2 % (ref 11.5–15.5)
WBC: 8.5 10*3/uL (ref 4.0–10.5)
WBC: 10.7 10*3/uL — ABNORMAL HIGH (ref 4.0–10.5)
nRBC: 0 % (ref 0.0–0.2)
nRBC: 0 % (ref 0.0–0.2)

## 2021-07-26 LAB — COMPREHENSIVE METABOLIC PANEL
ALT: 30 U/L (ref 0–44)
AST: 27 U/L (ref 15–41)
Albumin: 2.7 g/dL — ABNORMAL LOW (ref 3.5–5.0)
Alkaline Phosphatase: 68 U/L (ref 38–126)
Anion gap: 7 (ref 5–15)
BUN: 24 mg/dL — ABNORMAL HIGH (ref 8–23)
CO2: 27 mmol/L (ref 22–32)
Calcium: 10.8 mg/dL — ABNORMAL HIGH (ref 8.9–10.3)
Chloride: 97 mmol/L — ABNORMAL LOW (ref 98–111)
Creatinine, Ser: 1.08 mg/dL (ref 0.61–1.24)
GFR, Estimated: >60 mL/min (ref >60)
Glucose, Bld: 116 mg/dL — ABNORMAL HIGH (ref 70–99)
Potassium: 3.7 mmol/L (ref 3.5–5.1)
Sodium: 131 mmol/L — ABNORMAL LOW (ref 135–145)
Total Bilirubin: 0.5 mg/dL (ref 0.3–1.2)
Total Protein: 6 g/dL — ABNORMAL LOW (ref 6.5–8.1)

## 2021-07-26 LAB — URINALYSIS, ROUTINE W REFLEX MICROSCOPIC
Bilirubin Urine: NEGATIVE
Glucose, UA: NEGATIVE mg/dL
Hgb urine dipstick: NEGATIVE
Ketones, ur: NEGATIVE mg/dL
Leukocytes,Ua: NEGATIVE
Nitrite: NEGATIVE
Protein, ur: NEGATIVE mg/dL
Specific Gravity, Urine: 1.012 (ref 1.005–1.030)
pH: 6 (ref 5.0–8.0)

## 2021-07-26 LAB — TROPONIN I (HIGH SENSITIVITY)
Troponin I (High Sensitivity): 40 ng/L — ABNORMAL HIGH (ref <18)
Troponin I (High Sensitivity): 38 ng/L — ABNORMAL HIGH (ref <18)

## 2021-07-26 LAB — CORTISOL: Cortisol, Plasma: 10.6 ug/dL

## 2021-07-26 LAB — LACTIC ACID, PLASMA
Lactic Acid, Venous: 1.4 mmol/L (ref 0.5–1.9)
Lactic Acid, Venous: 1.2 mmol/L (ref 0.5–1.9)

## 2021-07-26 LAB — BASIC METABOLIC PANEL
Anion gap: 5 (ref 5–15)
BUN: 23 mg/dL (ref 8–23)
CO2: 27 mmol/L (ref 22–32)
Calcium: 9.9 mg/dL (ref 8.9–10.3)
Chloride: 98 mmol/L (ref 98–111)
Creatinine, Ser: 1.36 mg/dL — ABNORMAL HIGH (ref 0.61–1.24)
GFR, Estimated: 54 mL/min — ABNORMAL LOW (ref >60)
Glucose, Bld: 123 mg/dL — ABNORMAL HIGH (ref 70–99)
Potassium: 3.9 mmol/L (ref 3.5–5.1)
Sodium: 130 mmol/L — ABNORMAL LOW (ref 135–145)

## 2021-07-26 LAB — MAGNESIUM: Magnesium: 1.8 mg/dL (ref 1.7–2.4)

## 2021-07-26 LAB — MRSA NEXT GEN BY PCR, NASAL: MRSA by PCR Next Gen: NOT DETECTED

## 2021-07-26 LAB — SODIUM, URINE, RANDOM: Sodium, Ur: 76 mmol/L

## 2021-07-26 MED ORDER — AMIODARONE LOAD VIA INFUSION
150.0000 mg | Freq: Once | INTRAVENOUS | Status: AC
  Administered 2021-07-26: 150 mg via INTRAVENOUS
  Filled 2021-07-26: qty 83.34

## 2021-07-26 MED ORDER — SODIUM CHLORIDE 0.9 % IV SOLN: INTRAVENOUS | Status: DC

## 2021-07-26 MED ORDER — DIAZEPAM 5 MG PO TABS
5.0000 mg | ORAL_TABLET | Freq: Four times a day (QID) | ORAL | Status: DC | PRN
  Administered 2021-07-26 – 2021-07-27 (×4): 10 mg via ORAL
  Filled 2021-07-26 (×4): qty 2

## 2021-07-26 MED ORDER — OXYCODONE HCL 5 MG PO TABS
5.0000 mg | ORAL_TABLET | Freq: Four times a day (QID) | ORAL | Status: DC | PRN
  Administered 2021-07-26 – 2021-07-27 (×3): 5 mg via ORAL
  Filled 2021-07-26 (×3): qty 1

## 2021-07-26 MED ORDER — ORAL CARE MOUTH RINSE
15.0000 mL | Freq: Two times a day (BID) | OROMUCOSAL | Status: DC
  Administered 2021-07-26: 15 mL via OROMUCOSAL

## 2021-07-26 MED ORDER — AMIODARONE HCL IN DEXTROSE 360-4.14 MG/200ML-% IV SOLN
30.0000 mg/h | INTRAVENOUS | Status: DC
  Administered 2021-07-26 – 2021-07-27 (×3): 30 mg/h via INTRAVENOUS
  Filled 2021-07-26 (×4): qty 200

## 2021-07-26 MED ORDER — SODIUM CHLORIDE 0.9 % IV BOLUS
1000.0000 mL | Freq: Once | INTRAVENOUS | Status: AC
  Administered 2021-07-26: 1000 mL via INTRAVENOUS

## 2021-07-26 MED ORDER — SODIUM CHLORIDE 0.9 % IV SOLN
250.0000 mL | INTRAVENOUS | Status: DC
  Administered 2021-07-26 – 2021-07-27 (×2): 250 mL via INTRAVENOUS

## 2021-07-26 MED ORDER — DILTIAZEM LOAD VIA INFUSION
15.0000 mg | Freq: Once | INTRAVENOUS | Status: AC
  Administered 2021-07-26: 15 mg via INTRAVENOUS
  Filled 2021-07-26: qty 15

## 2021-07-26 MED ORDER — SODIUM CHLORIDE 0.9 % IV BOLUS: 500.0000 mL | Freq: Once | INTRAVENOUS | Status: DC

## 2021-07-26 MED ORDER — PHENYLEPHRINE HCL-NACL 20-0.9 MG/250ML-% IV SOLN
25.0000 ug/min | INTRAVENOUS | Status: DC
  Administered 2021-07-26: 110 ug/min via INTRAVENOUS
  Administered 2021-07-26: 130 ug/min via INTRAVENOUS
  Administered 2021-07-26: 25 ug/min via INTRAVENOUS
  Administered 2021-07-26 – 2021-07-27 (×3): 130 ug/min via INTRAVENOUS
  Administered 2021-07-27: 90 ug/min via INTRAVENOUS
  Filled 2021-07-26 (×8): qty 250

## 2021-07-26 MED ORDER — METOPROLOL TARTRATE 5 MG/5ML IV SOLN
5.0000 mg | INTRAVENOUS | Status: AC
  Administered 2021-07-26: 5 mg via INTRAVENOUS
  Filled 2021-07-26: qty 5

## 2021-07-26 MED ORDER — DILTIAZEM HCL-DEXTROSE 125-5 MG/125ML-% IV SOLN (PREMIX)
5.0000 mg/h | INTRAVENOUS | Status: DC
  Administered 2021-07-26: 5 mg/h via INTRAVENOUS
  Filled 2021-07-26: qty 125

## 2021-07-26 MED ORDER — SODIUM CHLORIDE 0.9 % IV BOLUS: 1000.0000 mL | Freq: Once | INTRAVENOUS | Status: AC

## 2021-07-26 MED ORDER — ALBUMIN HUMAN 25 % IV SOLN
25.0000 g | Freq: Once | INTRAVENOUS | Status: AC
  Administered 2021-07-26: 25 g via INTRAVENOUS
  Filled 2021-07-26: qty 100

## 2021-07-26 MED ORDER — SODIUM CHLORIDE 0.9 % IV SOLN
2.0000 g | Freq: Every day | INTRAVENOUS | Status: DC
  Administered 2021-07-26 – 2021-07-27 (×2): 2 g via INTRAVENOUS
  Filled 2021-07-26 (×2): qty 20

## 2021-07-26 MED ORDER — AMIODARONE HCL IN DEXTROSE 360-4.14 MG/200ML-% IV SOLN
60.0000 mg/h | INTRAVENOUS | Status: DC
  Administered 2021-07-26 (×2): 60 mg/h via INTRAVENOUS
  Filled 2021-07-26: qty 200

## 2021-07-26 MED ORDER — MIDODRINE HCL 5 MG PO TABS
10.0000 mg | ORAL_TABLET | Freq: Three times a day (TID) | ORAL | Status: DC
  Filled 2021-07-26 (×2): qty 2

## 2021-07-26 MED ORDER — CHLORHEXIDINE GLUCONATE CLOTH 2 % EX PADS
6.0000 | MEDICATED_PAD | Freq: Every day | CUTANEOUS | Status: DC
  Administered 2021-07-26 – 2021-07-27 (×2): 6 via TOPICAL

## 2021-07-26 NOTE — Progress Notes (Addendum)
Progress Note  Patient Name: James Massey Date of Encounter: 07/26/2021  Primary Cardiologist: James Field, MD  Subjective   Patient noted to go into rapid atrial flutter this AM - HR 150s initially, given 5mg  IV Lopressor then diltiazem drip at 5mg /hr, HR currently atrial flutter with variable block, 110s range. Patient denies any cardiac awareness of arrhythmia. No CP, palpitations, SOB, orthopnea. Continues to have back pain.  Inpatient Medications    Scheduled Meds:  allopurinol  300 mg Oral QHS   aspirin EC  81 mg Oral Daily   atorvastatin  40 mg Oral QHS   calcium-vitamin D  2 tablet Oral BID WC   feeding supplement  237 mL Oral TID BM   heparin injection (subcutaneous)  5,000 Units Subcutaneous Q8H   levothyroxine  50 mcg Oral QAC breakfast   magnesium oxide  400 mg Oral BID   multivitamin with minerals  2 tablet Oral Daily   pantoprazole  40 mg Oral BID   Continuous Infusions:  sodium chloride 125 mL/hr at 07/26/21 0821   diltiazem (CARDIZEM) infusion 5 mg/hr (07/26/21 0753)   PRN Meds: acetaminophen **OR** acetaminophen, diazepam, ondansetron **OR** ondansetron (ZOFRAN) IV, senna-docusate, traMADol   Vital Signs    Vitals:   07/26/21 0735 07/26/21 0740 07/26/21 0750 07/26/21 0756  BP: (!) 119/91   98/79  Pulse:    (!) 124  Resp:  (!) 23 (!) 23   Temp:      TempSrc:      SpO2:      Weight:      Height:        Intake/Output Summary (Last 24 hours) at 07/26/2021 0854 Last data filed at 07/26/2021 0510 Gross per 24 hour  Intake 3574.02 ml  Output 2650 ml  Net 924.02 ml   Last 3 Weights 07/14/2021 07/07/2021 07/03/2021  Weight (lbs) 180 lb 180 lb 180 lb  Weight (kg) 81.647 kg 81.647 kg 81.647 kg  Some encounter information is confidential and restricted. Go to Review Flowsheets activity to see all data.     Telemetry    Atrial flutter  beginning around 6:30am - Personally Reviewed  ECG    Multiple tracings reviewed, last with atrial flutter  151bpm, nonspecific STT changes- Personally Reviewed  Physical Exam   GEN: No acute distress.  HEENT: Normocephalic, atraumatic, sclera non-icteric. Neck: No JVD or bruits. Cardiac: Irregularly irregular, rate mildly elevated, no murmurs, rubs, or gallops.  Respiratory: Clear to auscultation bilaterally anteriorly. Pt states sitting up contributes too much to back pain. Breathing is unlabored. GI: Soft, nontender, non-distended, BS +x 4. MS: no deformity. Extremities: No clubbing or cyanosis. No edema. Distal pedal pulses are 2+ and equal bilaterally. Neuro:  AAOx3. Follows commands. Psych:  Responds to questions appropriately with a stoic affect.  Labs    High Sensitivity Troponin:   Recent Labs  Lab 07/10/2021 1641 08/04/2021 1855 07/23/21 0918  TROPONINIHS 146* 174* 83*      Cardiac EnzymesNo results for input(s): TROPONINI in the last 168 hours. No results for input(s): TROPIPOC in the last 168 hours.   Chemistry Recent Labs  Lab 07/20/2021 1531 07/23/21 0435 07/26/21 0318  NA 132* 131* 131*  K 4.4 3.8 3.7  CL 96* 97* 97*  CO2 30 27 27   GLUCOSE 104* 110* 116*  BUN 23 20 24*  CREATININE 1.58* 1.40* 1.08  CALCIUM 11.3* 11.1* 10.8*  PROT 6.5  --  6.0*  ALBUMIN 3.0*  --  2.7*  AST  34  --  27  ALT 42  --  30  ALKPHOS 89  --  68  BILITOT 0.7  --  0.5  GFRNONAA 45* 52* >60  ANIONGAP 6 7 7      Hematology Recent Labs  Lab 07/24/21 0041 07/25/21 0303 07/26/21 0318  WBC 8.1 8.8 8.5  RBC 3.34* 3.56* 3.67*  HGB 10.5* 11.2* 11.5*  HCT 31.4* 33.2* 34.7*  MCV 94.0 93.3 94.6  MCH 31.4 31.5 31.3  MCHC 33.4 33.7 33.1  RDW 13.1 13.2 13.2  PLT 218 251 249    BNP Recent Labs  Lab 07/17/2021 1601  BNP 62.5     DDimer No results for input(s): DDIMER in the last 168 hours.   Radiology    No results found.  Cardiac Studies   2D Echo 07/23/21  1. Left ventricular ejection fraction, by estimation, is 65 to 70%. The  left ventricle has normal function. The left  ventricle has no regional  wall motion abnormalities. There is mild concentric left ventricular  hypertrophy. Left ventricular diastolic  parameters were normal.   2. Right ventricular systolic function is normal. The right ventricular  size is normal. Tricuspid regurgitation signal is inadequate for assessing  PA pressure.   3. The mitral valve is grossly normal. Trivial mitral valve  regurgitation. No evidence of mitral stenosis.   4. The aortic valve is tricuspid. There is mild calcification of the  aortic valve. There is mild thickening of the aortic valve. Aortic valve  regurgitation is not visualized. Mild aortic valve stenosis. Aortic valve  mean gradient measures 15.7 mmHg.  Aortic valve Vmax measures 2.71 m/s.   5. The inferior vena cava is normal in size with greater than 50%  respiratory variability, suggesting right atrial pressure of 3 mmHg.   Patient Profile     76 y.o. male with PAF not on anticoagulation (dx 03/2020), PSVT/PACs/brief NSVT by monitor 04/2020, coronary artery calcification and aortic atherosclerosis, stage IV metastatic renal cancer (spine, bone, pulmonary), HLD, hypothyroidism, GERD with Barrett's esophagus, CKD stage 3a He was admitted with worsening weakness, AMS, increased urinary frequency, and right foot drop with inability to stand from chair/bed. MRI lumbar spine significant for metastatic disease with associated L3 cord compression. Imaging also showed widespread pulmonary metastasis. He has been treated with emergent stereotactic boxy radiation. Neurosurgery did not recommend surgical intervention. Cardiology was consulted 1/17 for mildly elevated troponin felt due to demand ischemia. Other issues include hypercalcemia felt 2/2 malignancy, mild anemia, hyponatremia, hypoalbuminemia. Cardiology asked to re-sign on 1/20 due to atrial fib RVR.   Assessment & Plan    1. Paroxysmal atrial fibrillation, now with atrial flutter - in NSR this admission until  development of atrial flutter this morning - asymptomatic from cardiac standpoint - BP has been an issue this admission frequently -> tolerating diltiazem @ 5mg /hr but likely cannot press further for additional rate control - bolus IV amiodarone 150mg  x 1 then drip - CHADSVASC is 3 for CAC/aortic atherosclerosis/age -> however, with widespread metastasis including into spine, do not know that he would be a good candidate for anticoagulation (pt has also preivously not wanted to be on anticoagulation) - may need oncology clearance - will review with MD - last TSH 06/2021 wnl, will repeat with AM labs  2. Elevated troponin on admission, cannot exclude NSTEMI - felt most likely due to demand ischemia given relatively low/flat (146->174->83) in the context of multiple physiologic stressors - 2D echo 1/17 normal EF, no WMA -  recommended for medical therapy with ASA, atorvastatin (known coronary calcification and aoritc atherosclerosis), not a good interventional candidate given medical issues with progressive widespread mets (would be concerned about committing patient to uninterrupted DAPT) - beta blocker not rx'd this admission due to soft BP - tx briefly with heparin, now on DVT ppx  3.  Stage IV metastatic renal cancer to spine, bone, pulmonary here with AMS, weakness and associated L3 cord compression - s/p radiation, management per primary teams  Remainder of issues per IM  For questions or updates, please contact [facility] Please consult [email or web address] for contact info under Cardiology/STEMI.  Signed, Charlie Pitter, PA-C 07/26/2021, 8:54 AM    Personally seen and examined. Agree with APP above with the following comments: Patient notes that he feels fine. Wife notes many concerns:  She is worried about his lack of strength. We reviewed that the spinal radiation treatment may not have triggered his return to AF RVR. Reviewed his persistent hypotension. Reviewed that he will be  discharged when there is a safe plan Exam notable for IRIR tachycardia; asymptomatic hypotension during exam with no confusion: stopped his diltiazem drip Labs notable for Creatine improvement (AKI has resolved) Personally reviewed relevant tests; new AF RVR on telemetry, controlled to 100 presently Would recommend  - Amiodarone drip; discussed with wife that we will work to get him rate controlled or converted prior to going him - hypotension precludes AV nodal agents - if ok with Onc, and if persistently in AF greater than 48 hours, would pursue short term (30 days) of AC.  Patient does not want to be on anticoagulation so this may be a moot point - he was briefly on diltiazem drip and amiodarone drip, this has contributed to hypotenision, diltiazem drip is stopped and getting another 500 cc bolus  - ultimately is AF is asymptomatic, and his hypotension does not appear to be cardiac medicated (he has had low blood pressure throughout much of this hospitalization) - I worry that he is intervascularly volume down related to his poor appetite and as an overall sequelae of his malignancy.   Rudean Haskell, MD [address] [place], [place] [address] [phone]  11:54 AM

## 2021-07-26 NOTE — Consult Note (Signed)
NAME:  James Massey, MRN:  [id number], DOB:  1945-11-09, LOS: 4 ADMISSION DATE:  07/08/2021, CONSULTATION DATE:  07/26/21 REFERRING MD:  Lonny Prude - TR[facility], CHIEF COMPLAINT:  hypotension  History of Present Illness:  76 yo M with recurrent renal cell carcinoma (now stg IV) with notable bony mets including L3 L4 met causing spinal nerve root compression, s/p nephrectomy who presented to [facility] ED 1/16 with AMS, foot drop. MR lumbar spine 1/16 revealed extensive bony mets consistent with prior CT. Admitted to [facility] for further management. Cardiology consulted 1/17 for pAfib, elevated troponins. Over course of hospitalization pt has had variable BP-- with hypotension largely limiting the use of nodal agents. On 1/20 pt was on dilt for Afib RVR and developed worse hypotension. Changed to amio and received 565m Bolus   PCCM was consulted in this setting    Labs on admission  Na 132 Glu 104, Cr 1.58, Ca 11.3 WBC 11, hgb 11.6 BNP 62.5 hs-trop 146  Pertinent  Medical History  Metastatic renal cell carcinoma HLD Hypothyroidism   Significant Hospital Events: Including procedures, antibiotic start and stop dates in addition to other pertinent events   [facility] in setting of bony mets to L spine 1/17 cards consult of Afib. First tx of palliative radiation for L spine mets 1/18 NSGY reviewed imaging -- no good surgical option, started IV decadron 1/20 worse hypotension on Dilt. Changed to amio. Transferred to ICU for hypotension. PCCM consult   Interim History / Subjective:  Dilt off. Amio bolus. 500 ml IVF bolus. Transferred to ICU   Says his pain is "tolerable"   Objective   Blood pressure (!) 86/54, pulse 81, temperature 97.8 F (36.6 C), temperature source Oral, resp. rate (!) 24, height 5' 8"  (1.727 m), weight 81.6 kg, SpO2 92 %.        Intake/Output Summary (Last 24 hours) at 07/26/2021 1250 Last data filed at 07/26/2021 1200 Gross per 24 hour  Intake  3725.11 ml  Output 1750 ml  Net 1975.11 ml   Filed Weights   07/17/2021 1420 07/11/2021 1426  Weight: 81.6 kg 81.6 kg    Examination: General: Debilitated appearing elderly M supine in bed NAD  HENT: NCAT pink mm anicteric sclera  Lungs: CTA. Even and unlabored on RA Cardiovascular: rrr s1s2 cap refill < 3 sec  Abdomen: soft ndnt + bowel sounds  Extremities: no acute joint deformity. No cyanosis or clubbing  Neuro: Drowsy. Awakens and is oriented x3. Following commands  Psych: withdrawn, flat affect. Appropriate insight  GU: condom cath, yellow urine   Resolved Hospital Problem list     Assessment & Plan:   Stg IV renal cell carcinoma - mets to bones, lung (s/p radiation to lung 2021) 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 No good surgical option  P -palliative radiation  -SBRT scheduled for 1/20, however pt declines doing this today. Will hold off & further discuss GOC  -decadron for swelling nerve irritation   -PRN ultram -- d/w pt that we can absolutely increase analgesic reg & to please let uKoreaknow if his pain can be better optimized   Afib RVR, back in sinus  -hypotension is limiting agent selection  P -dc dilt -amio -Cards following -high risk for anticoag   Hypotension -sepsis vs hypovolemic shock, vs drug-related hypotension in  setting of dilt + amio -sounds like poor PO intake + weight loss prior to admission. This admission is net even  P -off dilt -check LA, cortisol  -starting mididrine -additional IVF -peripheral neo   CKD 3 -trend renal indices, UOP  Hyponatremia  Malnutrition  P -IVF, encourage PO intake  -urine studies ordered   Hypercalcemia due to malignancy -trend  Goals of Care -stg IV renal cell carcinoma, spinal nerve compression due to bone mets -- outpt goal has been palliative radiation -concerned  that his body has declined this admission and acuity of interventions is increasing (now on pressors) -continue to address goals of care, code status -- wife planning to come to bedside 1/20 afternoon   Best Practice (right click and "Reselect all SmartList Selections" daily)   Diet/type: Regular consistency (see orders) DVT prophylaxis: prophylactic heparin  GI prophylaxis: PPI Lines: N/A Foley:  N/A Code Status:  full code Last date of multidisciplinary goals of care discussion [1/20 -- Dr. Carlis Abbott, pt wife via phone]  Labs   CBC: Recent Labs  Lab 07/12/2021 1531 07/23/21 0435 07/24/21 0041 07/25/21 0303 07/26/21 0318  WBC 11.0* 7.1 8.1 8.8 8.5  NEUTROABS 8.8*  --   --   --   --   HGB 11.6* 10.7* 10.5* 11.2* 11.5*  HCT 34.5* 32.2* 31.4* 33.2* 34.7*  MCV 95.0 94.7 94.0 93.3 94.6  PLT 275 224 218 251 800    Basic Metabolic Panel: Recent Labs  Lab 07/13/2021 1531 07/23/21 0435 07/26/21 0318  NA 132* 131* 131*  K 4.4 3.8 3.7  CL 96* 97* 97*  CO2 30 27 27   GLUCOSE 104* 110* 116*  BUN 23 20 24*  CREATININE 1.58* 1.40* 1.08  CALCIUM 11.3* 11.1* 10.8*   GFR: Estimated Creatinine Clearance: 57.2 mL/min (by C-G formula based on SCr of 1.08 mg/dL). Recent Labs  Lab 07/23/21 0435 07/24/21 0041 07/25/21 0303 07/26/21 0318  WBC 7.1 8.1 8.8 8.5    Liver Function Tests: Recent Labs  Lab 07/18/2021 1531 07/26/21 0318  AST 34 27  ALT 42 30  ALKPHOS 89 68  BILITOT 0.7 0.5  PROT 6.5 6.0*  ALBUMIN 3.0* 2.7*   No results for input(s): LIPASE, AMYLASE in the last 168 hours. No results for input(s): AMMONIA in the last 168 hours.  ABG No results found for: PHART, PCO2ART, PO2ART, HCO3, TCO2, ACIDBASEDEF, O2SAT   Coagulation Profile: Recent Labs  Lab 07/23/21 0435  INR 1.1    Cardiac Enzymes: No results for input(s): CKTOTAL, CKMB, CKMBINDEX, TROPONINI in the last 168 hours.  HbA1C: No results found for: HGBA1C  CBG: No results for input(s): GLUCAP in the  last 168 hours.  Review of Systems:   Review of Systems  Constitutional:  Positive for weight loss.  HENT: Negative.    Eyes: Negative.   Respiratory: Negative.    Cardiovascular: Negative.   Gastrointestinal: Negative.   Genitourinary:  Positive for frequency.  Musculoskeletal:  Positive for back pain.  Skin: Negative.   Neurological:  Positive for focal weakness and weakness.  Endo/Heme/Allergies: Negative.   Psychiatric/Behavioral: Negative.      Past Medical History:  He,  has a past medical history of Anxiety, Barrett esophagus, GERD (gastroesophageal reflux disease), Gout, Heart murmur, History of anemia, Kidney cancer, primary, 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 to other site [facility]), Mixed hyperlipidemia, Renal cell cancer, right ([place]), Right renal mass, and Wears glasses.   Surgical History:   Past Surgical History:  Procedure Laterality Date  CYSTOSCOPY WITH RETROGRADE PYELOGRAM, URETEROSCOPY AND STENT PLACEMENT Right 05/21/2018   Procedure: RIGHT RETROGRADE PYELOGRAM, RIGHT DIAGNOSTIC URETEROSCOPY AND STENT PLACEMENT;  Surgeon: Ardis Hughs, MD;  Location: [facility];  Service: Urology;  Laterality: Right;   INGUINAL HERNIA REPAIR Bilateral 09/2012   LAPAROSCOPIC NEPHRECTOMY, HAND ASSISTED Right 05/31/2018   Procedure: LAPAROSCOPIC RADICAL RIGHT NEPHRECTOMY;  Surgeon: Ardis Hughs, MD;  Location: [facility];  Service: Urology;  Laterality: Right;     Social History:   reports that he quit smoking about 33 years ago. His smoking use included cigarettes. He has a 7.50 pack-year smoking history. He has never used smokeless tobacco. He reports current alcohol use. He reports that he does not use drugs.   Family History:  His family history includes Breast cancer in his mother; Heart attack in his father. There is no history of Prostate cancer, Pancreatic cancer, or Colon cancer.   Allergies Allergies  Allergen Reactions   Bee Venom Swelling   Budesonide-Formoterol  Fumarate Other (See Comments)    RESPIRATORY ISSUES   Colchicine Nausea And Vomiting   Poison [employer] and ivy   Clindamycin/Lincomycin Rash     Home Medications  Prior to Admission medications   Medication Sig Start Date End Date Taking? Authorizing Provider  allopurinol (ZYLOPRIM) 300 MG tablet Take 300 mg by mouth at bedtime.    Yes [provider]  atorvastatin (LIPITOR) 40 MG tablet Take 40 mg by mouth at bedtime.    Yes [provider]  calcium-vitamin D (OSCAL WITH D) 500-200 MG-UNIT tablet Take 2 tablets by mouth 2 (two) times daily. 01/28/21  Yes [name], MD  diazepam (VALIUM) 5 MG tablet Take 5 mg by mouth every 8 (eight) hours as needed for anxiety.   Yes [provider]  furosemide (LASIX) 20 MG tablet TAKE 1 TABLET(20 MG) BY MOUTH DAILY FOR 14 DAYS Patient taking differently: Take 20 mg by mouth daily as needed for fluid or edema. 06/20/21  Yes [name], MD  hydrocortisone 2.5 % cream Apply 1 application topically 2 (two) times daily as needed (for hemorroidal flare ups).   Yes [provider]  levothyroxine (SYNTHROID) 50 MCG tablet Take 1 tablet (50 mcg total) by mouth daily before breakfast. 01/16/21  Yes Dessa Phi, DO  magnesium oxide (MAG-OX) 400 (240 Mg) MG tablet Take 1 tablet (400 mg total) by mouth 2 (two) times daily. 01/16/21  Yes Dessa Phi, DO  Multiple Vitamin (MULTIVITAMIN) tablet Take 2 tablets by mouth daily.   Yes [provider]  pantoprazole (PROTONIX) 40 MG tablet Take 40 mg by mouth 2 (two) times daily.   Yes [provider]  Simethicone 180 MG CAPS Take 180 mg by mouth 3 (three) times daily as needed (for gas/indigestion.).   Yes [provider]  traMADol (ULTRAM) 50 MG tablet TAKE 1 TABLET(50 MG) BY MOUTH EVERY 6 HOURS AS NEEDED Patient taking differently: Take 50 mg by mouth every 6 (six) hours as needed for severe pain or moderate pain. 03/18/21  Yes  [name], MD  levothyroxine (SYNTHROID) 25 MCG tablet TAKE 1 TABLET(25 MCG) BY MOUTH DAILY BEFORE AND BREAKFAST Patient not taking: Reported on 07/08/2021 07/05/21   [name], MD     Critical care time: 62     CRITICAL CARE Performed by: Cristal Generous   Total critical care time: 46 minutes  Critical care time was exclusive of separately billable  procedures and treating other patients.  Critical care was necessary to treat or prevent imminent or life-threatening deterioration.  Critical care was time spent personally by me on the following activities: development of treatment plan with patient and/or surrogate as well as nursing, discussions with consultants, evaluation of patient's response to treatment, examination of patient, obtaining history from patient or surrogate, ordering and performing treatments and interventions, ordering and review of laboratory studies, ordering and review of radiographic studies, pulse oximetry and re-evaluation of patient's condition.  Eliseo Gum MSN, AGACNP-BC [place] for pager  07/26/2021, 2:00 PM

## 2021-07-26 NOTE — Progress Notes (Signed)
Pt HR sustaining in 160s, pt asymptomatic. Charge nurse, rapid nurse, and MD [employer]. Metoprolol given and EKG completed and in pt chart.

## 2021-07-26 NOTE — Assessment & Plan Note (Addendum)
Etiology is unclear. Preceded by rapid atrial flutter requiring metoprolol IV x1, diltiazem drip with load and amiodarone drip with load. Patient presented with elevated troponin with initial concern for NSTEMI but with normal LVEF and no WMA. Afebrile. No leukocytosis. No infectious source. 1 L NS bolus given with some BP response during infusion with recurrent hypotension post-bolus. Transfused to stepdown unit. -Re-bolus 1 L NS -PCCM consult for consideration of vasopressor support -Troponin, lactic acid, cortisol

## 2021-07-26 NOTE — Significant Event (Signed)
Rapid Response Event Note   Reason for Call :  HR 160's sudden onset.  Initial Focused Assessment:  Patient alert, oriented x 4, and reports significant back pain. Patient given Tramadol at 0615 just prior to HR jumping to 160's. Patient does not display signs of agitation/anxiety. No known cardiac history per bedside RN and none found in documentation. No obvious tachypnea, lung sounds clear/diminished, no obvious peripheral edema. Patient denies chest pain or feelings of shortness of breath.   Interventions:  Spoke to on call hospitalist provider, Gershon Cull, NP who gave authorization for EKG and advised to notify attending as soon as possible. Paged attending MD, Dr. Lonny Prude. EKG showed a. Flutter with rate in 160's. Orders given for Metoprolol 5mg  IV push STAT and was given at 0706. Repeat EKG done per Dr. Lonny Prude when arrived to bedside that shows a.flutter with HR in 150's.  Plan of Care:  Progressive care orders with continued monitoring and Cardizem drip orders entered by Dr. Lonny Prude.   Event Summary:   MD Notified: [phone] J.Olena Heckle, [address] Dr. Lonny Prude Call Time: [phone] Arrival Time: 0645 End Time: [place], RN

## 2021-07-26 NOTE — Assessment & Plan Note (Addendum)
Patient with a history of paroxysmal atrial fibrillation vs frequent PACs. Rapid heart rate developed on 1/20 with heart rates up into the 150s. EKG concerning for atrial flutter. Frequent PACs noted on telemetry prior. Blood pressure stable. Given metoprolol 5 mg IV x1 without improvement of heart rate. -Cardizem 15 mg followed by IV drip -Re-consult cardiology

## 2021-07-26 NOTE — Progress Notes (Addendum)
PROGRESS NOTE    James Massey  QVZ:[id number] DOB: 06/07/1946 DOA: 07/11/2021 PCP: Myrtis Hopping., MD   Brief Narrative: James Massey is a 76 y.o. 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 Patient presented secondary to worsening weakness, altered mental status and increased urinary frequency. He was found to have right foot drop with inability to get up from chair/bed. MRI lumbar spine significant for metastatic disease with associated L3 cord compression. Radiation oncology consulted for radiation therapy. Patient developed rapid atrial flutter on 1/20 which responded to cardizem and amiodarone IV drip, but followed by hypotension and shock requiring transfer to stepdown in addition to [facility] consult.   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 Radiation oncology consulted and recommended emergent stereotactic boxy radiation starting 1/17 with plan for a total of 5 treatments. Discussed with neurosurgery (1/18) with recommendation for no surgical intervention at this time.  Shock ([place]) Etiology is unclear. Preceded by rapid atrial flutter requiring metoprolol IV x1, diltiazem drip with load and amiodarone drip with load. Patient presented with elevated troponin with initial concern for NSTEMI but with normal LVEF and no WMA. Afebrile. No leukocytosis. No infectious source. 1 L NS bolus given with some BP response during infusion with recurrent hypotension post-bolus. Transfused to stepdown unit. -Re-bolus 1 L NS -PCCM consult for consideration of vasopressor support -Troponin, lactic acid, cortisol  Atrial flutter with rapid ventricular response ([place]) Patient with a history of paroxysmal atrial fibrillation vs  frequent PACs. Rapid heart rate developed on 1/20 with heart rates up into the 150s. EKG concerning for atrial flutter. Frequent PACs noted on telemetry prior. Blood pressure stable. Given metoprolol 5 mg IV x1 without improvement of heart rate. -Cardizem 15 mg followed by IV drip -Re-consult cardiology  Hypercalcemia Likely secondary to malignancy. Mild. Some improvement of calcium with IV fluids. NS IV fluid bolus given -NS IV fluids at 125 mL/hr  Polyuria Urine osmolality is low. Possibly inability to concentrate urine. No recent obstructions. Patient has hypercalcemia which may be contributing. -Watch UOP closely -Treat hypercalcemia  Chronic kidney disease, stage 3a ([place]) Stable. Increased urine output. Urine is dilute. Urine output possibly related to kidney disease vs hypercalcemia.  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 PT/OT recommending SNF. 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  Demand ischemia ([place])- (present on admission) Initial concern for NSTEMI. Heparin IV started. Cardiology consulted. Troponin peak of 174 with downtrend. Transthoracic Echocardiogram without wall motion abnormalities or LV dysfunction. Cardiology now signed off.  Altered mental status- (present on admission) Unknown etiology. CT head negative. Patient currently oriented to person and place. No evidence of infection. CT chest without pneumonia. Urinalysis is clear. Seems stable, although wife thinks this has worsened slightly.  Primary malignant neoplasm of kidney 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 to other site [facility])- (present on admission) Patient with a history of right nephrectomy. Patient follows with Dr. Alen Blew as an outpatient.     DVT prophylaxis: Heparin subq Code Status:   Code Status: Full Code Family Communication: Wife at bedside Disposition Plan: Discharge to SNF when medically stable. Pending resolution of shock   Consultants:  Radiation  oncology Neurosurgery PCCM  Procedures:  Radiation therapy  Antimicrobials: None    Subjective: Patient reports  some pain in his shoulders and knees. No chest pain or dyspnea. No palpitations  Objective: Vitals:   07/26/21 1130 07/26/21 1145 07/26/21 1200 07/26/21 1215  BP: (!) 78/58 (!) 76/52 (!) 76/57 (!) 86/54  Pulse:    81  Resp: 19 (!) 24 (!) 25 (!) 24  Temp:      TempSrc:      SpO2:    92%  Weight:      Height:        Intake/Output Summary (Last 24 hours) at 07/26/2021 1304 Last data filed at 07/26/2021 1200 Gross per 24 hour  Intake 3485.11 ml  Output 1750 ml  Net 1735.11 ml    Filed Weights   08/06/2021 1420 07/11/2021 1426  Weight: 81.6 kg 81.6 kg    Examination:  General exam: Appears calm and comfortable Respiratory system: Clear to auscultation. Respiratory effort normal. Cardiovascular system: S1 & S2 heard, tachycardia with regular rhythm. 2+ radial pulses Gastrointestinal system: Abdomen is nondistended, soft and nontender. No organomegaly or masses felt. Normal bowel sounds heard. Central nervous system: Alert and oriented to self/place. Musculoskeletal: No edema. No calf tenderness Skin: No cyanosis. No rashes Psychiatry: Judgement and insight appear impaired. Blunt affect    Data Reviewed: I have personally reviewed following labs and imaging studies  CBC Lab Results  Component Value Date   WBC 8.5 07/26/2021   RBC 3.67 (L) 07/26/2021   HGB 11.5 (L) 07/26/2021   HCT 34.7 (L) 07/26/2021   MCV 94.6 07/26/2021   MCH 31.3 07/26/2021   PLT 249 07/26/2021   MCHC 33.1 07/26/2021   RDW 13.2 07/26/2021   LYMPHSABS 0.9 07/23/2021   MONOABS 1.1 (H) 07/08/2021   EOSABS 0.1 07/26/2021   BASOSABS 0.1 62/22/9798     Last metabolic panel Lab Results  Component Value Date   NA 131 (L) 07/26/2021   K 3.7 07/26/2021   CL 97 (L) 07/26/2021   CO2 27 07/26/2021   BUN 24 (H) 07/26/2021   CREATININE 1.08 07/26/2021   GLUCOSE 116 (H) 07/26/2021    GFRNONAA >60 07/26/2021   GFRAA 48 (L) 02/06/2020   CALCIUM 10.8 (H) 07/26/2021   PHOS 3.6 01/10/2021   PHOS 3.6 01/10/2021   PROT 6.0 (L) 07/26/2021   ALBUMIN 2.7 (L) 07/26/2021   BILITOT 0.5 07/26/2021   ALKPHOS 68 07/26/2021   AST 27 07/26/2021   ALT 30 07/26/2021   ANIONGAP 7 07/26/2021    CBG (last 3)  No results for input(s): GLUCAP in the last 72 hours.   GFR: Estimated Creatinine Clearance: 57.2 mL/min (by C-G formula based on SCr of 1.08 mg/dL).  Coagulation Profile: Recent Labs  Lab 07/23/21 0435  INR 1.1     Recent Results (from the past 240 hour(s))  Resp Panel by RT-PCR (Flu A&B, Covid) Nasopharyngeal Swab     Status: None   Collection Time: 07/19/2021  5:01 PM   Specimen: Nasopharyngeal Swab; Nasopharyngeal(NP) swabs in vial transport medium  Result Value Ref Range Status   SARS Coronavirus 2 by RT PCR NEGATIVE NEGATIVE Final    Comment: (NOTE) SARS-CoV-2 target nucleic acids are NOT DETECTED.  The SARS-CoV-2 RNA is generally detectable in upper respiratory specimens during the acute phase of infection. The lowest concentration of SARS-CoV-2 viral copies this assay can detect is 138 copies/mL. A negative result does not preclude SARS-Cov-2 infection and should not be used as the sole basis for treatment or other patient management decisions. A negative result may occur with  improper  specimen collection/handling, submission of specimen other than nasopharyngeal swab, presence of viral mutation(s) within the areas targeted by this assay, and inadequate number of viral copies(<138 copies/mL). A negative result must be combined with clinical observations, patient history, and epidemiological information. The expected result is Negative.  Fact Sheet for Patients:  EntrepreneurPulse.com.au  Fact Sheet for Healthcare Providers:  IncredibleEmployment.be  This test is no t yet approved or cleared by the [place] FDA  and  has been authorized for detection and/or diagnosis of SARS-CoV-2 by FDA under an Emergency Use Authorization (EUA). This EUA will remain  in effect (meaning this test can be used) for the duration of the COVID-19 declaration under Section 564(b)(1) of the Act, 21 U.S.C.section 360bbb-3(b)(1), unless the authorization is terminated  or revoked sooner.       Influenza A by PCR NEGATIVE NEGATIVE Final   Influenza B by PCR NEGATIVE NEGATIVE Final    Comment: (NOTE) The Xpert Xpress SARS-CoV-2/FLU/RSV plus assay is intended as an aid in the diagnosis of influenza from Nasopharyngeal swab specimens and should not be used as a sole basis for treatment. Nasal washings and aspirates are unacceptable for Xpert Xpress SARS-CoV-2/FLU/RSV testing.  Fact Sheet for Patients: EntrepreneurPulse.com.au  Fact Sheet for Healthcare Providers: IncredibleEmployment.be  This test is not yet approved or cleared by the [place] FDA and has been authorized for detection and/or diagnosis of SARS-CoV-2 by FDA under an Emergency Use Authorization (EUA). This EUA will remain in effect (meaning this test can be used) for the duration of the COVID-19 declaration under Section 564(b)(1) of the Act, 21 U.S.C. section 360bbb-3(b)(1), unless the authorization is terminated or revoked.  Performed at [facility], [place] [address] [place], [place] [address]          Radiology Studies: No results found.      Scheduled Meds:  allopurinol  300 mg Oral QHS   aspirin EC  81 mg Oral Daily   atorvastatin  40 mg Oral QHS   calcium-vitamin D  2 tablet Oral BID WC   Chlorhexidine Gluconate Cloth  6 each Topical Daily   feeding supplement  237 mL Oral TID BM   heparin injection (subcutaneous)  5,000 Units Subcutaneous Q8H   levothyroxine  50 mcg Oral QAC breakfast   magnesium oxide  400 mg Oral BID   mouth rinse  15 mL Mouth Rinse BID    multivitamin with minerals  2 tablet Oral Daily   pantoprazole  40 mg Oral BID   Continuous Infusions:  sodium chloride 125 mL/hr at 07/26/21 6045   sodium chloride     albumin human     amiodarone 60 mg/hr (07/26/21 1032)   Followed by   amiodarone     phenylephrine (NEO-SYNEPHRINE) Adult infusion     sodium chloride       LOS: 4 days     Cordelia Poche, MD [facility] 07/26/2021, 1:04 PM  If 7PM-7AM, please contact night-coverage [email or web address]

## 2021-07-26 NOTE — TOC Progression Note (Signed)
Transition of Care [facility]) - Progression Note    Patient Details  Name: James Massey MRN: [id number] Date of Birth: 10-Apr-1946  Transition of Care [facility]) CM/SW Contact  Leeroy Cha, RN Phone Number: 07/26/2021, 10:14 AM  Clinical Narrative:     Wife here this am and upset that he had a "cardiac episode " last night.  Patient had run of a.fib. appears to be more alert than previous days.  Wife is stating that we are pushing him to fast and he needs rest after the radiation treatments.  Explained to her that the treatment are being done per Dr. Tammi Klippel per her decision the other day. Will let Dr. Lonny Prude be aware of this.   Expected Discharge Plan: Home/Self Care Barriers to Discharge: Continued Medical Work up  Expected Discharge Plan and Services Expected Discharge Plan: Home/Self Care   Discharge Planning Services: CM Consult   Living arrangements for the past 2 months: Single Family Home                                       Social Determinants of Health (SDOH) Interventions    Readmission Risk Interventions No flowsheet data found.

## 2021-07-26 NOTE — Progress Notes (Signed)
°   07/26/21 1145  Assess: MEWS Score  BP (!) 76/52  ECG Heart Rate (!) 103  Resp (!) 24  Assess: MEWS Score  MEWS Temp 0  MEWS Systolic 2  MEWS Pulse 1  MEWS RR 1  MEWS LOC 0  MEWS Score 4  MEWS Score Color Red  Assess: if the MEWS score is Yellow or Red  Were vital signs taken at a resting state? Yes  Focused Assessment Change from prior assessment (see assessment flowsheet)  Does the patient meet 2 or more of the SIRS criteria? Yes  Does the patient have a confirmed or suspected source of infection? No  MEWS guidelines implemented *See Row Information* Yes  Take Vital Signs  Increase Vital Sign Frequency  Red: Q 1hr X 4 then Q 4hr X 4, if remains red, continue Q 4hrs  Escalate  MEWS: Escalate Red: discuss with charge nurse/RN and provider, consider discussing with RRT  Notify: Charge Nurse/RN  Name of Charge Nurse/RN Notified Lennox Laity, RN  Date Charge Nurse/RN Notified 07/26/21  Time Charge Nurse/RN Notified 7  Notify: Provider  Provider Name/Title Lonny Prude, MD  Date Provider Notified 07/26/21  Time Provider Notified 1130  Notification Type Face-to-face  Notification Reason Change in status  Provider response At bedside  Date of Provider Response 07/26/21  Time of Provider Response 1130  Notify: Rapid Response  Name of Rapid Response RN Notified Pamala Hurry, RN  Date Rapid Response Notified 07/26/21  Time Rapid Response Notified 1130  Document  Patient Outcome Transferred/level of care increased  Progress note created (see row info) Yes  Assess: SIRS CRITERIA  SIRS Temperature  0  SIRS Pulse 1  SIRS Respirations  1  SIRS WBC 0  SIRS Score Sum  2   Hospitalist and Cardiology notified. Bolus given, pt transferred to step down unit.

## 2021-07-26 NOTE — Care Management Important Message (Signed)
Important Message  Patient Details IM Letter placed in Patients room. Name: James Massey MRN: [id number] Date of Birth: 10-07-1945   Medicare Important Message Given:  Yes     Kerin Salen 07/26/2021, 11:17 AM

## 2021-07-26 NOTE — Assessment & Plan Note (Signed)
Urine osmolality is low. Possibly inability to concentrate urine. No recent obstructions. Patient has hypercalcemia which may be contributing. -Watch UOP closely -Treat hypercalcemia

## 2021-07-26 NOTE — Progress Notes (Signed)
I met with James Massey, James Massey, and James Massey regarding James ongoing care, prognosis, and treatment options. They understand that James cancer is progressing and he is unlikely to regain James previous quality of life with James rapidly progressive decline. Dr. Alen Blew is aware and agrees that James treatment options have been exhausted. We discussed the possibility of changing our focus to maintaining comfort, potentially leaving the hospital to go home with hospice. We will see how the next day goes. We will increase James pain and anxiety medications to better control James symptoms. He has family coming in town tomorrow. He wants to ensure he is comfortable with James time he has left. We discussed code status, and they agree that in the event of a cardiopulmonary arrest, he should not be resuscitated. DNR order changed, MOST form signed with Massey's permission, and RN updated.  James Hy, DO 07/26/21 6:11 PM [facility]

## 2021-07-27 DIAGNOSIS — I4892 Unspecified atrial flutter: Secondary | ICD-10-CM

## 2021-07-27 DIAGNOSIS — C649 Malignant neoplasm of unspecified kidney, except renal pelvis: Secondary | ICD-10-CM

## 2021-07-27 DIAGNOSIS — C7951 Secondary malignant neoplasm of bone: Principal | ICD-10-CM

## 2021-07-27 DIAGNOSIS — N1831 Chronic kidney disease, stage 3a: Secondary | ICD-10-CM

## 2021-07-27 LAB — BASIC METABOLIC PANEL
Anion gap: 8 (ref 5–15)
BUN: 21 mg/dL (ref 8–23)
CO2: 24 mmol/L (ref 22–32)
Calcium: 10.6 mg/dL — ABNORMAL HIGH (ref 8.9–10.3)
Chloride: 101 mmol/L (ref 98–111)
Creatinine, Ser: 1.26 mg/dL — ABNORMAL HIGH (ref 0.61–1.24)
GFR, Estimated: 59 mL/min — ABNORMAL LOW (ref >60)
Glucose, Bld: 98 mg/dL (ref 70–99)
Potassium: 4 mmol/L (ref 3.5–5.1)
Sodium: 133 mmol/L — ABNORMAL LOW (ref 135–145)

## 2021-07-27 LAB — TSH: TSH: 7.18 u[IU]/mL — ABNORMAL HIGH (ref 0.350–4.500)

## 2021-07-27 LAB — OSMOLALITY, URINE: Osmolality, Ur: 405 mOsm/kg (ref 300–900)

## 2021-07-27 LAB — CBC
HCT: 33 % — ABNORMAL LOW (ref 39.0–52.0)
Hemoglobin: 10.8 g/dL — ABNORMAL LOW (ref 13.0–17.0)
MCH: 30.9 pg (ref 26.0–34.0)
MCHC: 32.7 g/dL (ref 30.0–36.0)
MCV: 94.6 fL (ref 80.0–100.0)
Platelets: 331 10*3/uL (ref 150–400)
RBC: 3.49 MIL/uL — ABNORMAL LOW (ref 4.22–5.81)
RDW: 13.6 % (ref 11.5–15.5)
WBC: 10.6 10*3/uL — ABNORMAL HIGH (ref 4.0–10.5)
nRBC: 0 % (ref 0.0–0.2)

## 2021-07-27 MED ORDER — MORPHINE 100MG IN NS 100ML (1MG/ML) PREMIX INFUSION
1.0000 mg/h | INTRAVENOUS | Status: DC
  Administered 2021-07-27: 1 mg/h via INTRAVENOUS
  Administered 2021-07-28: 3 mg/h via INTRAVENOUS
  Filled 2021-07-27 (×3): qty 100

## 2021-07-27 MED ORDER — OXYCODONE HCL 5 MG PO TABS
10.0000 mg | ORAL_TABLET | ORAL | Status: DC | PRN
  Administered 2021-07-27 (×2): 10 mg via ORAL
  Filled 2021-07-27 (×2): qty 2

## 2021-07-27 NOTE — Progress Notes (Signed)
NAME:  James Massey, MRN:  [id number], DOB:  26-Jan-1946, LOS: 5 ADMISSION DATE:  07/31/2021, CONSULTATION DATE:  07/26/21 REFERRING MD:  Lonny Prude - TRH, CHIEF COMPLAINT:  hypotension  History of Present Illness:  76 yo M with recurrent renal cell carcinoma (now stg IV) with notable bony mets including L3 L4 met causing spinal nerve root compression, s/p nephrectomy who presented to [facility] ED 1/16 with AMS, foot drop. MR lumbar spine 1/16 revealed extensive bony mets consistent with prior CT. Admitted to [facility] for further management. Cardiology consulted 1/17 for pAfib, elevated troponins. Over course of hospitalization pt has had variable BP-- with hypotension largely limiting the use of nodal agents. On 1/20 pt was on dilt for Afib RVR and developed worse hypotension. Changed to amio and received 541m Bolus   PCCM was consulted in this setting   Pertinent  Medical History  Metastatic renal cell carcinoma HLD Hypothyroidism   Significant Hospital Events: Including procedures, antibiotic start and stop dates in addition to other pertinent events   [facility] in setting of bony mets to L spine 1/17 cards consult of Afib. First tx of palliative radiation for L spine mets 1/18 NSGY reviewed imaging -- no good surgical option, started IV decadron 1/20 worse hypotension on Dilt. Changed to amio. Transferred to ICU for hypotension. PCCM consult    Scheduled Meds:  allopurinol  300 mg Oral QHS   aspirin EC  81 mg Oral Daily   atorvastatin  40 mg Oral QHS   Chlorhexidine Gluconate Cloth  6 each Topical Daily   feeding supplement  237 mL Oral TID BM   heparin injection (subcutaneous)  5,000 Units Subcutaneous Q8H   levothyroxine  50 mcg Oral QAC breakfast   magnesium oxide  400 mg Oral BID   mouth rinse  15 mL Mouth Rinse BID   midodrine  10 mg Oral TID WC   pantoprazole  40 mg Oral BID   Continuous Infusions:  sodium chloride Stopped (07/26/21 1626)   sodium  chloride 10 mL/hr at 07/27/21 0700   sodium chloride 75 mL/hr at 07/27/21 0700   amiodarone 30 mg/hr (07/27/21 0700)   cefTRIAXone (ROCEPHIN)  IV Stopped (07/26/21 1713)   phenylephrine (NEO-SYNEPHRINE) Adult infusion 90 mcg/min (07/27/21 0904)   PRN Meds:.acetaminophen **OR** acetaminophen, diazepam, ondansetron **OR** ondansetron (ZOFRAN) IV, oxyCODONE, senna-docusate    Interim History / Subjective:   Lying in fetal position, denies specific complaints    Objective   Blood pressure (!) 98/56, pulse 83, temperature 98.9 F (37.2 C), temperature source Axillary, resp. rate 20, height 5' 8"  (1.727 m), weight 81.6 kg, SpO2 96 %.        Intake/Output Summary (Last 24 hours) at 07/27/2021 1030 Last data filed at 07/27/2021 0700 Gross per 24 hour  Intake 6171.72 ml  Output 1600 ml  Net 4571.72 ml   Filed Weights   08/03/2021 1420 07/28/2021 1426  Weight: 81.6 kg 81.6 kg    Examination: Tmax 99.1  General appearance:    terminally ill, minimally interactive   At Rest 02 sats  96% on RA  No jvd Oropharynx clear,  mucosa nl Neck supple Lungs with min rhonchi bilaterally RRR no s3 or or sign murmur Abd soft/ nl excursion  Extr warm with no edema or clubbing noted Neuro  Sensorium sedated on po meds ,  no obvious motor deficits     Resolved Hospital Problem list     Assessment & Plan:  Stg IV renal cell carcinoma - mets to bones, lung (s/p radiation to lung 2021) 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 No good surgical option  P -palliative radiation  -SBRT scheduled for 1/20, however pt declined Will hold off & further discuss GOC  >>> increased oxycodone 5 q 6 to 10 mg q 4h   Afib RVR, back in sinus  -hypotension is limiting agent selection  P >>> amio drip  and off dilt / back in SR so should be able to d/c neo     Hypotension -sepsis vs hypovolemic shock, vs drug-related hypotension in setting of dilt + amio   P -off dilt -check LA, cortisol ok 1/20  -Rx mididrine and d/c neo in ncb setting    CKD 3 Lab Results  Component Value Date   CREATININE 1.26 (H) 07/27/2021   CREATININE 1.36 (H) 07/26/2021   CREATININE 1.08 07/26/2021  >> no w/u indicated     Hyponatremia  Malnutrition  P -IVF, encourage PO intake  -urine studies  c/w siadh   Hypercalcemia due to malignancy -improved since admit   Goals of Care -stg IV renal cell carcinoma, spinal nerve compression due to bone mets -- outpt goal has been palliative radiation -concerned that his body has declined this admission and acuity of interventions is increasing (now on pressors) -continue to address goals of care, code status -- wife planning to come to bedside 1/20 afternoon   Best Practice (right click and "Reselect all SmartList Selections" daily)   Diet/type: Regular consistency (see orders) DVT prophylaxis: prophylactic heparin  GI prophylaxis: PPI Lines: N/A Foley:  N/A Code Status:  full code Last date of multidisciplinary goals of care discussion [1/20 -- Dr. Carlis Abbott, pt wife via phone]  Labs   CBC: Recent Labs  Lab 07/21/2021 1531 07/23/21 0435 07/24/21 0041 07/25/21 0303 07/26/21 0318 07/26/21 1332 07/27/21 0253  WBC 11.0*   < > 8.1 8.8 8.5 10.7* 10.6*  NEUTROABS 8.8*  --   --   --   --   --   --   HGB 11.6*   < > 10.5* 11.2* 11.5* 10.5* 10.8*  HCT 34.5*   < > 31.4* 33.2* 34.7* 31.9* 33.0*  MCV 95.0   < > 94.0 93.3 94.6 94.4 94.6  PLT 275   < > 218 251 249 277 331   < > = values in this interval not displayed.    Basic Metabolic Panel: Recent Labs  Lab 08/03/2021 1531 07/23/21 0435 07/26/21 0318 07/26/21 1332 07/27/21 0253  NA 132* 131* 131* 130* 133*  K 4.4 3.8 3.7 3.9 4.0  CL 96* 97* 97* 98 101  CO2 30 27 27 27 24   GLUCOSE 104* 110* 116* 123* 98  BUN 23 20 24* 23 21  CREATININE 1.58* 1.40*  1.08 1.36* 1.26*  CALCIUM 11.3* 11.1* 10.8* 9.9 10.6*  MG  --   --   --  1.8  --    GFR: Estimated Creatinine Clearance: 49 mL/min (A) (by C-G formula based on SCr of 1.26 mg/dL (H)). Recent Labs  Lab 07/25/21 0303 07/26/21 0318 07/26/21 1332 07/26/21 1643 07/27/21 0253  WBC 8.8 8.5 10.7*  --  10.6*  LATICACIDVEN  --   --  1.4 1.2  --     Liver Function Tests: Recent Labs  Lab 07/24/2021 1531 07/26/21 0318  AST  34 27  ALT 42 30  ALKPHOS 89 68  BILITOT 0.7 0.5  PROT 6.5 6.0*  ALBUMIN 3.0* 2.7*   No results for input(s): LIPASE, AMYLASE in the last 168 hours. No results for input(s): AMMONIA in the last 168 hours.  ABG No results found for: PHART, PCO2ART, PO2ART, HCO3, TCO2, ACIDBASEDEF, O2SAT   Coagulation Profile: Recent Labs  Lab 07/23/21 0435  INR 1.1    Cardiac Enzymes: No results for input(s): CKTOTAL, CKMB, CKMBINDEX, TROPONINI in the last 168 hours.  HbA1C: No results found for: HGBA1C  CBG: No results for input(s): GLUCAP in the last 168 hours.     Christinia Gully, MD Pulmonary and [place] [phone]   After 7:00 pm call Elink  [phone]

## 2021-07-27 NOTE — Progress Notes (Addendum)
Progress Note  Patient Name: James Massey Date of Encounter: 07/27/2021  [facility] Cardiologist: Evalina Field, MD   Subjective   Sleepy.  Denies chest pain/palpitations.   Inpatient Medications    Scheduled Meds:  allopurinol  300 mg Oral QHS   aspirin EC  81 mg Oral Daily   atorvastatin  40 mg Oral QHS   Chlorhexidine Gluconate Cloth  6 each Topical Daily   feeding supplement  237 mL Oral TID BM   heparin injection (subcutaneous)  5,000 Units Subcutaneous Q8H   levothyroxine  50 mcg Oral QAC breakfast   magnesium oxide  400 mg Oral BID   mouth rinse  15 mL Mouth Rinse BID   midodrine  10 mg Oral TID WC   pantoprazole  40 mg Oral BID   Continuous Infusions:  sodium chloride Stopped (07/26/21 1626)   sodium chloride 10 mL/hr at 07/26/21 1815   sodium chloride 75 mL/hr at 07/26/21 1815   amiodarone 30 mg/hr (07/26/21 2331)   cefTRIAXone (ROCEPHIN)  IV Stopped (07/26/21 1713)   phenylephrine (NEO-SYNEPHRINE) Adult infusion 130 mcg/min (07/27/21 0508)   PRN Meds: acetaminophen **OR** acetaminophen, diazepam, ondansetron **OR** ondansetron (ZOFRAN) IV, oxyCODONE, senna-docusate   Vital Signs    Vitals:   07/27/21 0300 07/27/21 0328 07/27/21 0400 07/27/21 0500  BP: (!) 112/57  (!) 104/58 126/82  Pulse: 88  79 94  Resp: (!) 23  (!) 23 (!) 22  Temp:  99.1 F (37.3 C)    TempSrc:  Oral    SpO2: 93%  93% 96%  Weight:      Height:        Intake/Output Summary (Last 24 hours) at 07/27/2021 0606 Last data filed at 07/27/2021 0300 Gross per 24 hour  Intake 3913.93 ml  Output 1600 ml  Net 2313.93 ml   Last 3 Weights 07/13/2021 07/20/2021 07/03/2021  Weight (lbs) 180 lb 180 lb 180 lb  Weight (kg) 81.647 kg 81.647 kg 81.647 kg  Some encounter information is confidential and restricted. Go to Review Flowsheets activity to see all data.      Telemetry    Sinus rhythm.  PACs.  - Personally Reviewed  ECG    07/26/21: Atrial flutter.  Rate 151 bpm. -  Personally Reviewed  Physical Exam   GEN: No acute distress.  Frail Neck: No JVD Cardiac: RRR, no murmurs, rubs, or gallops.  Respiratory: Clear to auscultation bilaterally. GI: Soft, nontender, non-distended  MS: No edema; No deformity. Neuro:  Nonfocal  Psych: Normal affect   Labs    High Sensitivity Troponin:   Recent Labs  Lab 07/16/2021 1641 07/12/2021 1855 07/23/21 0918 07/26/21 1332 07/26/21 1429  TROPONINIHS 146* 174* 83* 40* 38*     Chemistry Recent Labs  Lab 07/31/2021 1531 07/23/21 0435 07/26/21 0318 07/26/21 1332 07/27/21 0253  NA 132*   < > 131* 130* 133*  K 4.4   < > 3.7 3.9 4.0  CL 96*   < > 97* 98 101  CO2 30   < > 27 27 24   GLUCOSE 104*   < > 116* 123* 98  BUN 23   < > 24* 23 21  CREATININE 1.58*   < > 1.08 1.36* 1.26*  CALCIUM 11.3*   < > 10.8* 9.9 10.6*  MG  --   --   --  1.8  --   PROT 6.5  --  6.0*  --   --   ALBUMIN 3.0*  --  2.7*  --   --   AST 34  --  27  --   --   ALT 42  --  30  --   --   ALKPHOS 89  --  68  --   --   BILITOT 0.7  --  0.5  --   --   GFRNONAA 45*   < > >60 54* 59*  ANIONGAP 6   < > 7 5 8    < > = values in this interval not displayed.    Lipids No results for input(s): CHOL, TRIG, HDL, LABVLDL, LDLCALC, CHOLHDL in the last 168 hours.  Hematology Recent Labs  Lab 07/26/21 0318 07/26/21 1332 07/27/21 0253  WBC 8.5 10.7* 10.6*  RBC 3.67* 3.38* 3.49*  HGB 11.5* 10.5* 10.8*  HCT 34.7* 31.9* 33.0*  MCV 94.6 94.4 94.6  MCH 31.3 31.1 30.9  MCHC 33.1 32.9 32.7  RDW 13.2 13.2 13.6  PLT 249 277 331   Thyroid No results for input(s): TSH, FREET4 in the last 168 hours.  BNP Recent Labs  Lab 07/25/2021 1601  BNP 62.5    DDimer No results for input(s): DDIMER in the last 168 hours.   Radiology    No results found.  Cardiac Studies   2D Echo 07/23/21  1. Left ventricular ejection fraction, by estimation, is 65 to 70%. The  left ventricle has normal function. The left ventricle has no regional  wall motion  abnormalities. There is mild concentric left ventricular  hypertrophy. Left ventricular diastolic  parameters were normal.   2. Right ventricular systolic function is normal. The right ventricular  size is normal. Tricuspid regurgitation signal is inadequate for assessing  PA pressure.   3. The mitral valve is grossly normal. Trivial mitral valve  regurgitation. No evidence of mitral stenosis.   4. The aortic valve is tricuspid. There is mild calcification of the  aortic valve. There is mild thickening of the aortic valve. Aortic valve  regurgitation is not visualized. Mild aortic valve stenosis. Aortic valve  mean gradient measures 15.7 mmHg.  Aortic valve Vmax measures 2.71 m/s.   5. The inferior vena cava is normal in size with greater than 50%  respiratory variability, suggesting right atrial pressure of 3 mmHg.   Patient Profile     76 y.o. male with PAF not on anticoagulation, SVT/NSVT, nonobstructive coronary disease, atherosclerosis of the aorta, CKD 3 and stage IV metastatic renal cell carcinoma admitted with progressive neurologic findings and altered mental status in the setting of metastatic lumbar spinal cord compression and widespread pulmonary metastasis.  Cardiology consulted for elevated troponin and A. fib with RVR.  Assessment & Plan    #Paroxysmal atrial fibrillation/atrial flutter: Atrial flutter new this admission.  He is previously had atrial fibrillation and has not been on anticoagulation.  Not a good candidate given his widespread metastatic disease.  Diltiazem discontinued due to hypotension.  He is now back in sinus rhythm.  Recommend switching amiodarone to oral.  However per nursing he is refusing oral medications so continue IV for now.  Would continue for 30 days and re-evaluate.    #Elevated troponin: Likely demand ischemia, though troponin did peak at 174.  Not a candidate for invasive work-up.  Normal systolic function without wall motion abnormalities on  echo.  #Stage IV metastatic renal cancer: Progressive disease despite XRT.  Complicated by hyponatremia and diabetes insipidus induced hypercalcemia.  Goals of care have been reevaluated and he has been made DNR.  #  CAD/Aortic atherosclerosis:  Remains on atorvastatin and aspirin.      For questions or updates, please contact [place] Please consult [email or web address] for contact info under        Signed, Skeet Latch, MD  07/27/2021, 6:06 AM

## 2021-07-27 NOTE — Progress Notes (Signed)
PT Cancellation Note  Patient Details Name: James Massey MRN: [id number] DOB: 05/02/1946   Cancelled Treatment:    Reason Eval/Treat Not Completed: Other (comment).BP soft, and per CCM notes pt possibly going home with Hospice/comfort care, has more family coming in today. Will defer PT at this time but follow until [place] firm.   [facility] 07/27/2021, 9:14 AM

## 2021-07-28 DIAGNOSIS — R531 Weakness: Secondary | ICD-10-CM

## 2021-07-28 DIAGNOSIS — C7951 Secondary malignant neoplasm of bone: Secondary | ICD-10-CM | POA: Diagnosis not present

## 2021-07-28 DIAGNOSIS — I4892 Unspecified atrial flutter: Secondary | ICD-10-CM | POA: Diagnosis not present

## 2021-07-28 DIAGNOSIS — Z7189 Other specified counseling: Secondary | ICD-10-CM

## 2021-07-28 MED ORDER — BIOTENE DRY MOUTH MT LIQD: 15.0000 mL | OROMUCOSAL | Status: DC | PRN

## 2021-07-28 MED ORDER — ACETAMINOPHEN 325 MG PO TABS: 650.0000 mg | ORAL_TABLET | Freq: Four times a day (QID) | ORAL | Status: DC | PRN

## 2021-07-28 MED ORDER — HALOPERIDOL LACTATE 5 MG/ML IJ SOLN
0.5000 mg | INTRAMUSCULAR | Status: DC | PRN
  Administered 2021-07-29: 0.5 mg via INTRAVENOUS
  Filled 2021-07-28: qty 1

## 2021-07-28 MED ORDER — GLYCOPYRROLATE 1 MG PO TABS: 1.0000 mg | ORAL_TABLET | ORAL | Status: DC | PRN

## 2021-07-28 MED ORDER — HALOPERIDOL 0.5 MG PO TABS: 0.5000 mg | ORAL_TABLET | ORAL | Status: DC | PRN

## 2021-07-28 MED ORDER — SODIUM CHLORIDE 0.9 % IV SOLN: INTRAVENOUS | Status: DC

## 2021-07-28 MED ORDER — ACETAMINOPHEN 650 MG RE SUPP: 650.0000 mg | Freq: Four times a day (QID) | RECTAL | Status: DC | PRN

## 2021-07-28 MED ORDER — GLYCOPYRROLATE 0.2 MG/ML IJ SOLN
0.2000 mg | INTRAMUSCULAR | Status: DC | PRN
  Administered 2021-07-28 – 2021-07-29 (×2): 0.2 mg via INTRAVENOUS
  Filled 2021-07-28: qty 1

## 2021-07-28 MED ORDER — ONDANSETRON 4 MG PO TBDP: 4.0000 mg | ORAL_TABLET | Freq: Four times a day (QID) | ORAL | Status: DC | PRN

## 2021-07-28 MED ORDER — ONDANSETRON HCL 4 MG/2ML IJ SOLN: 4.0000 mg | Freq: Four times a day (QID) | INTRAMUSCULAR | Status: DC | PRN

## 2021-07-28 MED ORDER — POLYVINYL ALCOHOL 1.4 % OP SOLN: 1.0000 [drp] | Freq: Four times a day (QID) | OPHTHALMIC | Status: DC | PRN

## 2021-07-28 MED ORDER — HALOPERIDOL LACTATE 2 MG/ML PO CONC: 0.5000 mg | ORAL | Status: DC | PRN

## 2021-07-28 MED ORDER — GLYCOPYRROLATE 0.2 MG/ML IJ SOLN
0.2000 mg | INTRAMUSCULAR | Status: DC | PRN
  Filled 2021-07-28: qty 1

## 2021-07-28 NOTE — Progress Notes (Signed)
NAME:  James Massey, MRN:  [id number], DOB:  [birth year], LOS: 6 ADMISSION DATE:  07/30/2021, CONSULTATION DATE:  07/26/21 REFERRING MD:  Lonny Prude - TR[facility], CHIEF COMPLAINT:  hypotension  History of Present Illness:  76 yo M with recurrent renal cell carcinoma (now stg IV) with notable bony mets including L3 L4 met causing spinal nerve root compression, s/p nephrectomy who presented to [facility] ED 1/16 with AMS, foot drop. MR lumbar spine 1/16 revealed extensive bony mets consistent with prior CT. Admitted to [facility] for further management. Cardiology consulted 1/17 for pAfib, elevated troponins. Over course of hospitalization pt has had variable BP-- with hypotension largely limiting the use of nodal agents. On 1/20 pt was on dilt for Afib RVR and developed worse hypotension. Changed to amio and received 577m Bolus   PCCM was consulted in this setting   Pertinent  Medical History  Metastatic renal cell carcinoma HLD Hypothyroidism   Significant Hospital Events: Including procedures, antibiotic start and stop dates in addition to other pertinent events   [facility] foot drop in setting of bony mets to L spine 1/17 cards consult of Afib. First tx of palliative radiation for L spine mets 1/18 NSGY reviewed imaging -- no good surgical option, started IV decadron 1/20 worse hypotension on Dilt. Changed to amio. Transferred to ICU for hypotension. PCCM consult  1/21 transitioned to full comfort care    Scheduled Meds:  Chlorhexidine Gluconate Cloth  6 each Topical Daily   Continuous Infusions:  sodium chloride 10 mL/hr at 07/28/21 0041   morphine 3 mg/hr (07/28/21 0041)   PRN Meds:.acetaminophen **OR** acetaminophen, diazepam, oxyCODONE    Interim History / Subjective:  Comfort goals met / fm at bedside  Objective   Blood pressure (!) 80/57, pulse 96, temperature 98.2 F (36.8 C), temperature source Axillary, resp. rate 16, height 5' 8"  (1.727 m), weight 81.6 kg, SpO2 97  %.        Intake/Output Summary (Last 24 hours) at 07/28/2021 0604 Last data filed at 07/28/2021 0100 Gross per 24 hour  Intake 3588.88 ml  Output 3250 ml  Net 338.88 ml   Filed Weights   07/21/2021 1420 07/30/2021 1426  Weight: 81.6 kg 81.6 kg    Examination: Appears comfortabel, fetal position        Resolved Hospital Problem list     Assessment & Plan:   Stg IV renal cell carcinoma - mets to bones, lung (s/p radiation to lung 2021) 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 No good surgical option  P MS drip   Afib RVR, back in sinus  -hypotension is limiting agent selection  P Not causing symptoms that can't be addressed with MS  drip > no change rx     Hypotension -sepsis vs hypovolemic shock, vs drug-related hypotension in setting of dilt + amio   P No escalation as this is not causing symtpoms       CKD 3 Lab Results  Component Value Date   CREATININE 1.26 (H) 07/27/2021   CREATININE 1.36 (H) 07/26/2021   CREATININE 1.08 07/26/2021  >> no w/u indicated     Hyponatremia  Malnutrition  P Comfort rx only   Hypercalcemia due to malignancy -improved since admit   Goals of Care -stg IV renal cell carcinoma, spinal nerve compression due to bone mets -- outpt  goal has been palliative radiation > comfort rx only       Labs   CBC: Recent Labs  Lab 08/02/2021 1531 07/23/21 0435 07/24/21 0041 07/25/21 0303 07/26/21 0318 07/26/21 1332 07/27/21 0253  WBC 11.0*   < > 8.1 8.8 8.5 10.7* 10.6*  NEUTROABS 8.8*  --   --   --   --   --   --   HGB 11.6*   < > 10.5* 11.2* 11.5* 10.5* 10.8*  HCT 34.5*   < > 31.4* 33.2* 34.7* 31.9* 33.0*  MCV 95.0   < > 94.0 93.3 94.6 94.4 94.6  PLT 275   < > 218 251 249 277 331   < > = values in this interval not displayed.    Basic Metabolic Panel: Recent Labs  Lab 07/14/2021 1531  07/23/21 0435 07/26/21 0318 07/26/21 1332 07/27/21 0253  NA 132* 131* 131* 130* 133*  K 4.4 3.8 3.7 3.9 4.0  CL 96* 97* 97* 98 101  CO2 30 27 27 27 24   GLUCOSE 104* 110* 116* 123* 98  BUN 23 20 24* 23 21  CREATININE 1.58* 1.40* 1.08 1.36* 1.26*  CALCIUM 11.3* 11.1* 10.8* 9.9 10.6*  MG  --   --   --  1.8  --    GFR: Estimated Creatinine Clearance: 49 mL/min (A) (by C-G formula based on SCr of 1.26 mg/dL (H)). Recent Labs  Lab 07/25/21 0303 07/26/21 0318 07/26/21 1332 07/26/21 1643 07/27/21 0253  WBC 8.8 8.5 10.7*  --  10.6*  LATICACIDVEN  --   --  1.4 1.2  --     Liver Function Tests: Recent Labs  Lab 07/12/2021 1531 07/26/21 0318  AST 34 27  ALT 42 30  ALKPHOS 89 68  BILITOT 0.7 0.5  PROT 6.5 6.0*  ALBUMIN 3.0* 2.7*   No results for input(s): LIPASE, AMYLASE in the last 168 hours. No results for input(s): AMMONIA in the last 168 hours.  ABG No results found for: PHART, PCO2ART, PO2ART, HCO3, TCO2, ACIDBASEDEF, O2SAT   Coagulation Profile: Recent Labs  Lab 07/23/21 0435  INR 1.1    Cardiac Enzymes: No results for input(s): CKTOTAL, CKMB, CKMBINDEX, TROPONINI in the last 168 hours.  HbA1C: No results found for: HGBA1C  CBG: No results for input(s): GLUCAP in the last 168 hours.      Christinia Gully, MD Pulmonary and [place] [phone]   After 7:00 pm call [place]  [phone]

## 2021-07-28 NOTE — Progress Notes (Signed)
Progress Note  Patient Name: James Massey Date of Encounter: 07/28/2021  [facility] Cardiologist: Evalina Field, MD   Subjective   Sleepy.  Not answering questions.   Inpatient Medications    Scheduled Meds:  Chlorhexidine Gluconate Cloth  6 each Topical Daily   Continuous Infusions:  sodium chloride 10 mL/hr at 07/28/21 0041   morphine 3 mg/hr (07/28/21 0041)   PRN Meds: acetaminophen **OR** acetaminophen, diazepam, oxyCODONE   Vital Signs    Vitals:   07/27/21 1300 07/27/21 1400 07/27/21 1500 07/27/21 2000  BP: (!) 82/51 (!) 91/58 (!) 80/57   Pulse: 90 86 90 96  Resp: 17 13 17 16   Temp:      TempSrc:      SpO2: 94% 96% 93% 97%  Weight:      Height:        Intake/Output Summary (Last 24 hours) at 07/28/2021 0800 Last data filed at 07/28/2021 0700 Gross per 24 hour  Intake 1211.09 ml  Output 3500 ml  Net -2288.91 ml   Last 3 Weights 07/23/2021 07/26/2021 07/03/2021  Weight (lbs) 180 lb 180 lb 180 lb  Weight (kg) 81.647 kg 81.647 kg 81.647 kg  Some encounter information is confidential and restricted. Go to Review Flowsheets activity to see all data.      Telemetry    Atrial fibrillation.  Rate 100s-160s  - Personally Reviewed  ECG    07/26/21: Atrial flutter.  Rate 151 bpm. - Personally Reviewed  Physical Exam   GEN: No acute distress.  Frail Neck: No JVD Cardiac: Irregularly irregular.  Tachycardic.  No murmurs, rubs, or gallops.  Respiratory: Clear to auscultation bilaterally. GI: Soft, nontender, non-distended  MS: No edema; No deformity. Neuro:  Nonfocal  Psych: Normal affect   Labs    High Sensitivity Troponin:   Recent Labs  Lab 07/07/2021 1641 07/14/2021 1855 07/23/21 0918 07/26/21 1332 07/26/21 1429  TROPONINIHS 146* 174* 83* 40* 38*     Chemistry Recent Labs  Lab 08/06/2021 1531 07/23/21 0435 07/26/21 0318 07/26/21 1332 07/27/21 0253  NA 132*   < > 131* 130* 133*  K 4.4   < > 3.7 3.9 4.0  CL 96*   < > 97* 98 101   CO2 30   < > 27 27 24   GLUCOSE 104*   < > 116* 123* 98  BUN 23   < > 24* 23 21  CREATININE 1.58*   < > 1.08 1.36* 1.26*  CALCIUM 11.3*   < > 10.8* 9.9 10.6*  MG  --   --   --  1.8  --   PROT 6.5  --  6.0*  --   --   ALBUMIN 3.0*  --  2.7*  --   --   AST 34  --  27  --   --   ALT 42  --  30  --   --   ALKPHOS 89  --  68  --   --   BILITOT 0.7  --  0.5  --   --   GFRNONAA 45*   < > >60 54* 59*  ANIONGAP 6   < > 7 5 8    < > = values in this interval not displayed.    Lipids No results for input(s): CHOL, TRIG, HDL, LABVLDL, LDLCALC, CHOLHDL in the last 168 hours.  Hematology Recent Labs  Lab 07/26/21 0318 07/26/21 1332 07/27/21 0253  WBC 8.5 10.7* 10.6*  RBC 3.67* 3.38* 3.49*  HGB 11.5* 10.5* 10.8*  HCT 34.7* 31.9* 33.0*  MCV 94.6 94.4 94.6  MCH 31.3 31.1 30.9  MCHC 33.1 32.9 32.7  RDW 13.2 13.2 13.6  PLT 249 277 331   Thyroid  Recent Labs  Lab 07/27/21 0253  TSH 7.180*    BNP Recent Labs  Lab 07/14/2021 1601  BNP 62.5    DDimer No results for input(s): DDIMER in the last 168 hours.   Radiology    No results found.  Cardiac Studies   2D Echo 07/23/21  1. Left ventricular ejection fraction, by estimation, is 65 to 70%. The  left ventricle has normal function. The left ventricle has no regional  wall motion abnormalities. There is mild concentric left ventricular  hypertrophy. Left ventricular diastolic  parameters were normal.   2. Right ventricular systolic function is normal. The right ventricular  size is normal. Tricuspid regurgitation signal is inadequate for assessing  PA pressure.   3. The mitral valve is grossly normal. Trivial mitral valve  regurgitation. No evidence of mitral stenosis.   4. The aortic valve is tricuspid. There is mild calcification of the  aortic valve. There is mild thickening of the aortic valve. Aortic valve  regurgitation is not visualized. Mild aortic valve stenosis. Aortic valve  mean gradient measures 15.7 mmHg.  Aortic  valve Vmax measures 2.71 m/s.   5. The inferior vena cava is normal in size with greater than 50%  respiratory variability, suggesting right atrial pressure of 3 mmHg.   Patient Profile     76 y.o. male with PAF not on anticoagulation, SVT/NSVT, nonobstructive coronary disease, atherosclerosis of the aorta, CKD 3 and stage IV metastatic renal cell carcinoma admitted with progressive neurologic findings and altered mental status in the setting of metastatic lumbar spinal cord compression and widespread pulmonary metastasis.  Cardiology consulted for elevated troponin and A. fib with RVR.  Assessment & Plan    #Paroxysmal atrial fibrillation/atrial flutter: Atrial flutter new this admission.  He is previously had atrial fibrillation and has not been on anticoagulation.  Not a good candidate given his widespread metastatic disease.  Diltiazem discontinued due to hypotension.  He has transitioned to comfort care.  Amiodarone discontinued.  He is tachycardic but asymptomatic.  If he has symptomatic tachycardia, he could be given digoxin by hospice.  #Elevated troponin: Likely demand ischemia, though troponin did peak at 174.  Not a candidate for invasive work-up.  Normal systolic function without wall motion abnormalities on echo.  #Stage IV metastatic renal cancer: Progressive disease despite XRT.  Complicated by hyponatremia and diabetes insipidus induced hypercalcemia.  Goals of care have been reevaluated and he has been made DNR and transitioned to comfort care.  # CAD/Aortic atherosclerosis:  Remains on atorvastatin and aspirin.  [facility] will sign off.   Medication Recommendations:  prn digoxin for symptomatic tachycardia Other recommendations (labs, testing, etc):  none Follow up as an outpatient:  n/a      For questions or updates, please contact [place] Please consult [email or web address] for contact info under        Signed, Skeet Latch, MD  07/28/2021, 8:00 AM

## 2021-07-28 NOTE — Progress Notes (Signed)
Chaplain responded to page for family support.  Family requested prayer as their pastor was unable to be with them that day.  Chaplain provided prayer with Richard's wife as well as his daughter and another friend. Chaplain also provided listening presence to Richard's wife in the waiting area while she was giving his daughter time with him.  [place], [place] Pager, [phone] 9:23 PM

## 2021-07-28 NOTE — Consult Note (Signed)
Consultation Note Date: 07/28/2021   Patient Name: James Massey  DOB: [birth year]  MRN: [id number]  Age / Sex: 76 y.o., male  PCP: Myrtis Hopping., MD Referring Physician: Julian Hy, DO  Reason for Consultation: Establishing goals of care  HPI/Patient Profile: 76 y.o. 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 MR lumbar spine 1/16 revealed extensive bony mets consistent with prior CT. Admitted to [facility] for further management. Cardiology consulted 1/17 for pAfib, elevated troponins. Over course of hospitalization pt has had variable BP-- with hypotension largely limiting the use of nodal agents. On 1/20 pt was on dilt for Afib RVR and developed worse hypotension. Changed to amio and received 548m Bolus    PCCM was consulted and patient has since been cared for in the ICU. Goals of care discussions were initiated and code status revised to DNR, since then, additional discussions held and patient now on Morphine drip and comfort measures, a palliative consult has been requested for ongoing comfort care, additional support for family and for pain and non pain symptom management at end of life.   Patient resting in bed, at present in no distress. I met with his sister, wife, daughter and other family members at bedside.   Palliative medicine is specialized medical care for people living with serious illness. It focuses on providing relief from the symptoms and stress of a serious illness. The goal is to improve quality of life for both the patient and the family. Goals of care: Broad aims of medical therapy in relation to the patient's values and preferences. Our aim is to provide  medical care aimed at enabling patients to achieve the goals that matter most to them, given the circumstances of their particular medical situation and their constraints.   Patient's family understands the current condition of the patient and endorse plan to continue comfort measures, will add end-of-life order set for PRNs for any end of life symptoms. Chaplain consult. Offered active listening, supportive presence and other therapeutic techniques with family at bedside today, at the time of this initial consultation. See below.   HCPOA  Wife, daughter.   SUMMARY OF RECOMMENDATIONS   DNR Comfort measures Agree with current comfort measures Chaplain consult for additional support Anticipate hospital death, continue to monitor.   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 The following aspects are pertinent.  Past Medical History:  Diagnosis Date   Anxiety    Barrett esophagus    GERD (gastroesophageal reflux disease)    Gout    Heart murmur    History of anemia    Kidney cancer, primary, 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 to other site [facility])    Mixed hyperlipidemia    Renal cell cancer, right (HCC)    Right renal mass    Wears glasses    Social History   Socioeconomic History   Marital  status: Married    Spouse name: Not on file   Number of children: 2   Years of education: 16   Highest education level: Bachelor's degree (e.g., BA, AB, BS)  Occupational History   Occupation: retired Biochemist, clinical  Tobacco Use   Smoking status: Former    Packs/day: 0.50    Years: 15.00    Pack years: 7.50    Types: Cigarettes    Quit date: 12/12/1987    Years since quitting: 33.6   Smokeless tobacco: Never  Vaping Use   Vaping Use: Never used  Substance and Sexual Activity   Alcohol use: Yes    Comment: occasional beer   Drug use: Never   Sexual activity: Not Currently  Other Topics Concern   Not on file  Social History Narrative   Lives with wife in [place].  Has two grown natural children. Currently has active cancer in several locations   In body, but has not recently spread to new areas according to patient. Retired from [employer].   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   Social Determinants of Radio broadcast assistant Strain: Not on file  Food Insecurity: Not on file  Transportation Needs: Not on file  Physical Activity: Not on file  Stress: Not on file  Social Connections: Not on file   Family History  Problem Relation Age of Onset   Breast cancer Mother    Heart attack Father    Prostate cancer Neg Hx    Pancreatic cancer Neg Hx    Colon cancer Neg Hx    Scheduled Meds:  Chlorhexidine Gluconate Cloth  6 each Topical Daily   Continuous Infusions:  sodium chloride 10 mL/hr at 07/28/21 1000   morphine 3 mg/hr (07/28/21 1000)   PRN Meds:.acetaminophen **OR** acetaminophen, diazepam, oxyCODONE Medications Prior to Admission:  Prior to Admission medications   Medication Sig Start Date End Date Taking? Authorizing Provider  allopurinol (ZYLOPRIM) 300 MG tablet Take 300 mg by mouth at bedtime.    Yes [provider]  atorvastatin (LIPITOR) 40 MG tablet  Take 40 mg by mouth at bedtime.    Yes [provider]  calcium-vitamin D (OSCAL WITH D) 500-200 MG-UNIT tablet Take 2 tablets by mouth 2 (two) times daily. 01/28/21  Yes [name], MD  diazepam (VALIUM) 5 MG tablet Take 5 mg by mouth every 8 (eight) hours as needed for anxiety.   Yes [provider]  furosemide (LASIX) 20 MG tablet TAKE 1 TABLET(20 MG) BY MOUTH DAILY FOR 14 DAYS Patient taking differently: Take 20 mg by mouth daily as needed for fluid or edema. 06/20/21  Yes [name], MD  hydrocortisone 2.5 % cream  Apply 1 application topically 2 (two) times daily as needed (for hemorroidal flare ups).   Yes [provider]  levothyroxine (SYNTHROID) 50 MCG tablet Take 1 tablet (50 mcg total) by mouth daily before breakfast. 01/16/21  Yes Dessa Phi, DO  magnesium oxide (MAG-OX) 400 (240 Mg) MG tablet Take 1 tablet (400 mg total) by mouth 2 (two) times daily. 01/16/21  Yes Dessa Phi, DO  Multiple Vitamin (MULTIVITAMIN) tablet Take 2 tablets by mouth daily.   Yes [provider]  pantoprazole (PROTONIX) 40 MG tablet Take 40 mg by mouth 2 (two) times daily.   Yes [provider]  Simethicone 180 MG CAPS Take 180 mg by mouth 3 (three) times daily as needed (for gas/indigestion.).   Yes [provider]  traMADol (ULTRAM) 50 MG tablet TAKE 1 TABLET(50 MG) BY MOUTH EVERY 6 HOURS AS NEEDED Patient taking differently: Take 50 mg by mouth every 6 (six) hours as needed for severe pain or moderate pain. 03/18/21  Yes [name], MD  levothyroxine (SYNTHROID) 25 MCG tablet TAKE 1 TABLET(25 MCG) BY MOUTH DAILY BEFORE AND BREAKFAST Patient not taking: Reported on 07/27/2021 07/05/21   [name], MD   Allergies  Allergen Reactions   Bee Venom Swelling   Budesonide-Formoterol Fumarate Other (See Comments)    RESPIRATORY ISSUES   Colchicine Nausea And Vomiting   Poison [employer] and ivy   Clindamycin/Lincomycin  Rash   Review of Systems Doesn't awaken or verbalize  Physical Exam Doesn't awaken of verbalize Appears comfortable Shallow regular work of breathing Irregular  Vital Signs: BP (!) 80/57    Pulse (!) 162    Temp (!) 100.6 F (38.1 C) (Axillary)    Resp 15    Ht 5' 8"  (1.727 m)    Wt 81.6 kg    SpO2 (!) 89%    BMI 27.37 kg/m  Pain Scale: CPOT POSS *See Group Information*: S-Acceptable,Sleep, easy to arouse Pain Score: 6    SpO2: SpO2: (!) 89 % O2 Device:SpO2: (!) 89 % O2 Flow Rate: .   IO: Intake/output summary:  Intake/Output Summary (Last 24 hours) at 07/28/2021 1121 Last data filed at 07/28/2021 1000 Gross per 24 hour  Intake 1332.15 ml  Output 2300 ml  Net -967.85 ml    LBM: Last BM Date: 07/25/21 Baseline Weight: Weight: 81.6 kg Most recent weight: Weight: 81.6 kg     Palliative Assessment/Data:   PPS 20%  Time In: 9 Time Out:10  Time Total: 60 Greater than 50%  of this time was spent counseling and coordinating care related to the above assessment and plan.  Signed by: Loistine Chance, MD   Please contact Palliative Medicine Team phone at [phone] for questions and concerns.  For individual provider: See Shea Evans

## 2021-07-28 NOTE — Progress Notes (Signed)
Pt noted with cardiac changes on heart monitor - HR maintained 88-105 throughout night - until 0610, when HR became irregular, ranging from 100's to 160s.  Pt without s/s pain or discomfort at this time.  Morphine drip continues.  Sister at bedside.  Wife called regarding HR changes - she will make her way in.

## 2021-07-29 ENCOUNTER — Ambulatory Visit: Payer: Medicare Other

## 2021-07-29 DIAGNOSIS — C7951 Secondary malignant neoplasm of bone: Secondary | ICD-10-CM | POA: Diagnosis not present

## 2021-07-29 DIAGNOSIS — R531 Weakness: Secondary | ICD-10-CM | POA: Diagnosis not present

## 2021-07-30 ENCOUNTER — Ambulatory Visit: Payer: Medicare Other

## 2021-07-30 DIAGNOSIS — Z66 Do not resuscitate: Secondary | ICD-10-CM

## 2021-07-30 DIAGNOSIS — Z515 Encounter for palliative care: Secondary | ICD-10-CM

## 2021-07-31 ENCOUNTER — Ambulatory Visit: Payer: Medicare Other

## 2021-07-31 LAB — CULTURE, BLOOD (ROUTINE X 2)
Culture: NO GROWTH
Culture: NO GROWTH
Special Requests: ADEQUATE
Special Requests: ADEQUATE

## 2021-08-01 ENCOUNTER — Ambulatory Visit: Payer: Medicare Other

## 2021-08-02 ENCOUNTER — Ambulatory Visit: Payer: Medicare Other

## 2021-08-05 ENCOUNTER — Ambulatory Visit: Payer: Medicare Other

## 2021-08-05 ENCOUNTER — Other Ambulatory Visit (HOSPITAL_BASED_OUTPATIENT_CLINIC_OR_DEPARTMENT_OTHER): Payer: Self-pay

## 2021-08-07 NOTE — Progress Notes (Signed)
Called to room by pts wife and sister, pt absent of respirations and cardiac activity. Time of death 10-02-2218, verified by Joesphine Bare RN. Hortencia Conradi RN

## 2021-08-07 NOTE — Progress Notes (Signed)
Daily Progress Note   Patient Name: James Massey       Date: August 21, 2021 DOB: [birth year]  Age: 76 y.o. MRN#: [id number] Attending Physician: Lanier Clam, MD Primary Care Physician: Myrtis Hopping., MD Admit Date: 07/30/2021  Reason for Consultation/Follow-up: Terminal Care  Subjective:  Resting in bed, no distress, sister and wife at bedside.   Length of Stay: 7  Current Medications: Scheduled Meds:   Chlorhexidine Gluconate Cloth  6 each Topical Daily    Continuous Infusions:  sodium chloride 10 mL/hr at Aug 21, 2021 0800   sodium chloride     morphine 3 mg/hr (August 21, 2021 0800)    PRN Meds: antiseptic oral rinse, diazepam, glycopyrrolate **OR** glycopyrrolate **OR** glycopyrrolate, haloperidol **OR** haloperidol **OR** haloperidol lactate, ondansetron **OR** ondansetron (ZOFRAN) IV, polyvinyl alcohol  Physical Exam         Unresponsive No distress Shallow regular breath sounds No edema  Vital Signs: BP (!) 71/23 Comment: Comfort measures   Pulse (!) 114    Temp (!) 100.6 F (38.1 C) (Axillary)    Resp (!) 25    Ht 5' 8"  (1.727 m)    Wt 81.6 kg    SpO2 (!) 74%    BMI 27.37 kg/m  SpO2: SpO2: (!) 74 % O2 Device: O2 Device: Room Air O2 Flow Rate:    Intake/output summary:  Intake/Output Summary (Last 24 hours) at 08/21/2021 1332 Last data filed at 08/21/2021 0800 Gross per 24 hour  Intake 259.89 ml  Output 500 ml  Net -240.11 ml   LBM: Last BM Date: 07/25/21 Baseline Weight: Weight: 81.6 kg Most recent weight: Weight: 81.6 kg       Palliative Assessment/Data:      Patient Active Problem List   Diagnosis Date Noted   Atrial flutter with rapid ventricular response ([place]) 07/26/2021   Shock ([place]) 07/26/2021   Polyuria 07/26/2021   Hypercalcemia 07/25/2021    Demand ischemia ([place]) 07/31/2021   Foot drop, right 08/06/2021   Chronic kidney disease, stage 3a ([place]) 07/07/2021   B12 deficiency 01/16/2021   Depression 01/16/2021   Hypoalbuminemia 01/16/2021   Generalized weakness 01/16/2021   Hypothyroidism (acquired) 01/13/2021   Altered mental status 37/10/8887   Acute metabolic encephalopathy 16/94/5038   Hypokalemia 01/09/2021   Hypomagnesemia 01/09/2021   Hypocalcemia 01/09/2021   Primary malignant neoplasm  of kidney 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 to other site [facility]) 09/05/2020   Goals of care, counseling/discussion 09/05/2020   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 MR lumbar spine 1/16 revealed extensive bony mets consistent with prior CT. Admitted to [facility] for further management. Cardiology consulted 1/17 for pAfib, elevated troponins. Over course of hospitalization pt has had variable BP-- with hypotension largely limiting the use of nodal agents. On 1/20 pt was on dilt for Afib RVR and developed worse hypotension. Changed to amio and received 566m Bolus    PCCM was consulted and patient has since been cared for in the ICU. Goals of care discussions were initiated and code status revised to DNR, since then, additional discussions held and patient now on Morphine drip and comfort measures, a palliative consult has been requested for ongoing comfort care, additional support for family and for pain and non pain symptom management at end of life.     Assessment:  End of life care.    Recommendations/Plan:  Signs and symptoms at end of life discussed Continue current comfort measures Encouraged self care for family holding vigil at bedside.  Appreciate chaplain consult.   Goals of Care and Additional Recommendations: Limitations on Scope of Treatment: Full Comfort Care  Code Status:    Code Status Orders  (From admission, onward)           Start     Ordered   07/28/21 1134  Do not attempt resuscitation (DNR)  Continuous       Question Answer Comment  In the event of cardiac or respiratory ARREST Do not call a code blue   In the event of cardiac or respiratory ARREST Do not perform Intubation, CPR, defibrillation or ACLS   In the event of cardiac or respiratory ARREST Use medication by any route, position, wound care, and other measures to relive pain and suffering. May use oxygen, suction and manual treatment of airway obstruction as needed for comfort.      07/28/21 1134           Code Status History     Date Active Date Inactive Code Status Order ID Comments User Context   07/26/2021 1804 07/28/2021 1134 DNR [id number] CJulian Hy DO Inpatient   07/24/2021 1431 07/26/2021 1804 Full Code [id number] NMariel Aloe MD Inpatient   07/23/2021 1503 07/24/2021 1431 DNR [id number] LLittle Ishikawa MD Inpatient   07/16/2021 2011 07/23/2021 1502 Full Code [id number] Chotiner, BYevonne Aline MD ED   01/09/2021 1831 01/17/2021 0319 Full Code [id number] AJennye Boroughs MD ED      Advance Directive Documentation    Flowsheet Row Most Recent Value  Type of Advance Directive Healthcare Power of Attorney, Living will  Pre-existing out of facility DNR order (yellow form or pink MOST form) --  "MOST" Form in Place? --       Prognosis:  Hours - Days  Discharge Planning: Anticipated Hospital Death  Care plan was discussed with  patient's wife and sister.  Thank you for allowing the Palliative Medicine Team to assist in the care of this  patient.   Time In: 1300 Time Out: 1325 Total Time 25 Prolonged Time Billed No       Greater than 50%  of this time was spent counseling and coordinating care related to the above assessment and plan.  Loistine Chance, MD  Please contact Palliative Medicine Team phone at [phone] for questions and concerns.

## 2021-08-07 NOTE — Progress Notes (Signed)
Nutrition Brief Note  Patient seen for initial assessment by this RD on 07/23/21. Chart reviewed and patient discussed in rounds this AM. Patient now transitioning to comfort care. He is NPO.   No further nutrition interventions planned at this time. Please re-consult as needed.     Jarome Matin, MS, RD, LDN Inpatient Clinical Dietitian RD pager # available in [place]  After hours/weekend pager # available in [facility]

## 2021-08-07 NOTE — Progress Notes (Signed)
Chaplain engaged in an initial visit with Delfino Lovett, his wife, and his sister.  Through family, Chaplain was able to learn about him.  They shared that Delfino Lovett is extremely intelligent and loves shows like Jeopardy.  His sister stated that he would read the encyclopedia as an adolescent and was able to retain all sorts of information.  He could read something one time and remember it.  He could tell the makes and models of cars right away just because of the studying and reading he did.  Mrs. Krisko talked about how generous, kind and loving Delfino Lovett is.  She voiced that he has done a lot of volunteer work and given to a number of organizations including the [employer] where they met.  They have been married thirteen years.  They have shared a love of business endeavors, antiques, and finding rare and memorable items at [employer]    Mrs. Stockman shared that Delfino Lovett has lived a full and good life. She has been grateful for the ICU team that has provided him care.   Chaplain offered listening, presence, support, and prayer with them.     08-11-21 1200  Clinical Encounter Type  Visited With Patient and family together  Visit Type Spiritual support  Referral From Palliative care team  Consult/Referral To Chaplain  Spiritual Encounters  Spiritual Needs Prayer;Emotional;Grief support

## 2021-08-07 NOTE — Progress Notes (Addendum)
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 MR lumbar spine 1/16 revealed extensive bony mets consistent with prior CT.Marland Kitchen Admitted to [facility] for further management. Cardiology consulted 1/17 for pAfib, elevated troponins. Over course of hospitalization pt has had variable BP-- with hypotension largely limiting the use of nodal agents. On 1/20 pt was on dilt for Afib RVR and developed worse hypotension. Changed to amio and received 574m Bolus   PCCM was consulted in this setting   Pertinent  Medical History  Metastatic renal cell carcinoma HLD Hypothyroidism   Significant Hospital Events: Including procedures, antibiotic start and stop dates in addition to other pertinent events   [facility] setting of bony mets to L spine 1/17 cards consult of Afib. First tx of palliative radiation for L spine mets 1/18 NSGY reviewed imaging -- no good surgical option, started IV decadron 1/20 worse hypotension on Dilt. Changed to amio. Transferred to ICU for hypotension. PCCM consult  1/21 transitioned to full comfort care. Morphine gtt started 1/23 transfer to medsurg    Interim History / Subjective:  Appears comfortable   Objective   Blood pressure (Abnormal) 80/57, pulse (Abnormal) 102, temperature (Abnormal) 100.6 F (38.1 C), temperature source Axillary, resp. rate 15, height 5' 8"  (1.727 m), weight 81.6 kg, SpO2 90 %.        Intake/Output Summary (Last 24 hours) at 101/30/20230733 Last data filed at [id number] Gross per 24 hour  Intake 247.3 ml  Output 500 ml  Net -252.7 ml   Filed  Weights   07/15/2021 1420 08/04/2021 1426  Weight: 81.6 kg 81.6 kg    Examination: General 76year old male lying on left side in fetal position. No distress on Morphine infusion  HENT NCAT no JVD  Pulm clear no accessory use Card rrr Abd soft  Neuro sedated    Resolved Hospital Problem list     Assessment & Plan:   Stg IV renal cell carcinoma - mets to bones, lung (s/p radiation to lung 2021) Spinal nerve root compression due to bone mets Foot drop due to spinal root compression  Acute metabolic encephalopathy (2/2 hypercalcemia)  Afib RVR, back in sinus  Drug related Hypotension: sepsis ruled out. CKD 3 Hyponatremia  Malnutrition  Hypercalcemia due to malignancy Goals of Care: full DNR/Comfort care   Discussion This is a 76year old male w/ stg IV renal cell carcinoma, spinal nerve compression due to bone mets -- outpt goal has been palliative radiation > comfort rx only. Came in w/ acute encephalopathy, AF w/ RVR and hypotension for which PCCM consulted. Given end-stage disease and fairly rapid decline QOL decision was made to transition to full comfort care and pt was transitioned to morphine gtt on 1/21 and MOST form completed. I think he will die in the hospital   Plan Move to medsurg Continue Comfort care Dc XRT (more painful to move him and put him thru this than any potential benefit)    Will transfer to medsurg.  Ask [facility] to re-assume care  PErick ColaceACNP-BC [place]  Pager # [phone] OR # [phone] if no answer

## 2021-08-07 NOTE — Death Summary Note (Signed)
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 [facility]) Active Problems:   Primary malignant neoplasm of kidney 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 to other site [facility])   Goals of care, counseling/discussion   Altered mental status   Depression   Generalized weakness   Demand ischemia ([place])   Foot drop, right   Chronic kidney disease, stage 3a ([place])   Hypercalcemia   Atrial flutter with rapid ventricular response ([place])   Shock ([place])   Polyuria   DNR (do not resuscitate)   Comfort measures only status   Brief Hospital Course (including significant findings, care, treatment, and services provided and events leading to death)  James Massey is a 76 y.o. year old male with metastatic cancer who was admitted with leg weakness found to have compression of spine due to malignant disease.  Course complicated by development of A. fib with RVR.  Some mild hypotension.  Received palliative radiation to the lumbar spine metastases.  No surgical options per neurosurgery.  01/20 had worsening hypertension on diltiazem was changed to amiodarone.  He was transferred to ICU and PCCM was consulted.  After goals of care discussion of the next 24 hours, patient was transitioned to comfort care.  He passed peacefully in the evening of 08-22-21.    Pertinent Labs and Studies  Significant Diagnostic Studies CT Head Wo Contrast  Result Date: 08-15-2021 CLINICAL DATA:  Mental status change, EXAM: CT HEAD WITHOUT CONTRAST TECHNIQUE:  Contiguous axial images were obtained from the base of the skull through the vertex without intravenous contrast. RADIATION DOSE REDUCTION: This exam was performed according to the departmental dose-optimization program which includes automated exposure control, adjustment of the mA and/or kV according to patient size and/or use of iterative reconstruction technique. COMPARISON:  None. FINDINGS: Brain: No acute intracranial hemorrhage. No focal mass lesion. No CT evidence of acute infarction. No midline shift or mass effect. No hydrocephalus. Basilar cisterns are patent. There are periventricular and subcortical white matter hypodensities. Generalized cortical atrophy. Vascular: No hyperdense vessel or unexpected calcification. Skull: Normal. Negative for fracture or focal lesion. Sinuses/Orbits: Paranasal sinuses and mastoid air cells are clear. Orbits are clear. Other: None. IMPRESSION: 1. No acute intracranial findings. 2. Chronic atrophy and white matter microvascular disease. Electronically Signed   By: [name] M.D.   On: 08-15-21 15:52   CT Chest Wo Contrast  Result Date: 2021/08/15 CLINICAL DATA:  Shortness of breath EXAM: CT CHEST WITHOUT CONTRAST TECHNIQUE: Multidetector CT imaging of the chest was performed following the standard protocol without IV contrast. RADIATION DOSE REDUCTION: This exam was performed according to the departmental dose-optimization program which includes automated exposure control, adjustment of the mA and/or kV according to patient size and/or use of iterative reconstruction technique. COMPARISON:  Chest x-ray from earlier the same day, CT from 06/22/2021 FINDINGS: Cardiovascular: Somewhat limited due to lack of IV contrast. Atherosclerotic calcifications are noted. No aneurysmal dilatation is seen. Coronary calcifications are noted. No cardiac enlargement is seen. Mediastinum/Nodes: Thoracic inlet is within normal limits. No sizable hilar or  mediastinal adenopathy  is noted. The esophagus as visualized is within normal limits. Lungs/Pleura: Lungs are well aerated bilaterally. Mild emphysematous changes are seen. Scattered pulmonary nodules are noted throughout both lungs. Largest of these is noted on image number 51 of series 7 measuring up to 7 mm. This shows some increase in size when compare with the prior exam. The left apical nodule seen on prior study is not as well visualized on today's study. No sizable effusion is noted. Scarring is noted in the right apex stable from the prior CT examination. No focal confluent infiltrate is noted. Upper Abdomen: No acute abnormality. 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 Musculoskeletal: Degenerative changes of the thoracic spine are noted. No acute rib abnormality is noted. Chronic mixed lytic and sclerotic lesion in the T3 vertebral body posteriorly is noted. There is an expansile lesion in the inferior aspect of the sternum stable in appearance from the prior exam. Left scapular lytic lesion is again noted and stable in appearance. IMPRESSION: No focal infiltrate is identified. Multiple bilateral pulmonary nodules are noted which have increased in size from the prior exam consistent with mild progressive metastatic disease. Lytic lesions within the left scapula, sternum and T3 vertebral body stable in appearance from the prior exam consistent with metastatic disease. Aortic Atherosclerosis (ICD10-I70.0) and Emphysema (ICD10-J43.9). Electronically Signed   By: Inez Catalina M.D.   On: 07/25/2021 19:59   MR Lumbar Spine W Wo Contrast  Result Date: 07/31/2021 CLINICAL DATA:  Initial evaluation for low back pain, right foot drop. EXAM: MRI LUMBAR SPINE WITHOUT AND WITH CONTRAST TECHNIQUE: Multiplanar and multiecho pulse sequences of the lumbar spine were obtained without and with intravenous contrast. CONTRAST:  45mL GADAVIST GADOBUTROL 1 MMOL/ML IV SOLN COMPARISON:  CT from 06/22/2021. FINDINGS: Segmentation: Standard.  Lowest well-formed disc space labeled the L5-S1 level. Alignment:  Mild levoscoliosis.  4 mm anterolisthesis of L4 on L5. Vertebrae: Large infiltrative and destructive metastatic lesion involving the right greater than left posterior elements at L2 through L4 again seen. Although exact measurements are difficult, mass measures approximately 6.1 x 10.6 x 7.0 cm in greatest dimensions. Lesion involves and largely obliterates the right greater than left posterior elements of L3 and L4. Additional superior extension to involve the posterior elements of L2 noted as well. Extraosseous extension into the adjacent right and posterior paraspinous musculature. Epidural extension into the right greater than left epidural space at the levels of L3 and L4, greatest at L3 where there is resultant severe spinal stenosis. Extension to involve the right L2 and L3 neural foramina noted as well. Involvement of the right greater than left L3 and L4 vertebral bodies. Associated pathologic fracture at L3 with mild 25% central height loss. No visible significant bony retropulsion. Vertebral body height otherwise maintained. No other metastatic implants within the lumbar spine. Conus medullaris and cauda equina: Conus extends to the L1 level. Conus medullaris within normal limits. Paraspinal and other soft tissues: Paraspinous tumor extending from L2 through L4 as above. Paraspinous soft tissues demonstrate no other acute finding. 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 Disc levels: L1-2:  Negative interspace.  Mild facet hypertrophy.  No stenosis. L2-3: Disc desiccation with mild disc bulge, eccentric to the right. Moderate bilateral facet hypertrophy. Infiltrative tumor partially involves the right L2-3 facet and adjacent posterior paraspinous musculature. Tumor extends to partially involve the right neural foramen and epidural space. Resultant moderate spinal stenosis with right greater than left lateral recess narrowing. Mild to moderate  right  foraminal stenosis. Left neural foramina remains patent. L3-4: Extensive tumor involvement involving the right pedicle and posterior elements of L3, with partial involvement of the L3 vertebral body itself. Associated pathologic fracture with up to 25% central height loss without significant bony retropulsion. Extensive epidural involvement involving the right and posterior epidural space (series 11, image 20). Resultant severe canal with bilateral subarticular stenosis, right worse than left. Thecal sac is markedly narrowed measuring approximately 4 mm in AP diameter at its most narrow point. Tumor extends to involve the right neural foramen with resultant moderate to severe right L3 foraminal stenosis. Mild to moderate left foraminal narrowing noted as well related to disc bulge and underlying facet disease. L4-5: 4 mm anterolisthesis with mild disc bulge and disc desiccation. Severe bilateral facet arthrosis. Tumor centered at the L3 level extends inferiorly to partially involve the posterior elements of L4. Small amount of epidural extension into the right posterior epidural space at this level (series 2, image 11). Resultant mild-to-moderate canal with bilateral subarticular stenosis. Foramina remain patent. L5-S1: Disc desiccation without significant disc bulge. Mild facet hypertrophy. No canal or lateral recess stenosis. Foramina remain patent. IMPRESSION: 1. Large infiltrative and destructive metastatic lesion centered about the right greater than left posterior elements of L2 through L4 as above. Tumor extension into the epidural space at the L3 and L4 levels, greatest at L3 where there is resultant severe spinal stenosis and near complete effacement of the thecal sac. Emergent neuro surgical and/or radiation oncology consultation recommended. 2. Additional tumor extension into the right L2 and L3 neural foramina. Resultant mild right L2 foraminal stenosis, with moderate to severe right L3 foraminal  narrowing. 3. Associated pathologic fracture at L3 with up to 25% height loss without significant bony retropulsion. Electronically Signed   By: Jeannine Boga M.D.   On: 07/27/2021 23:43   DG Chest Portable 1 View  Result Date: 07/15/2021 CLINICAL DATA:  Evaluate for pneumonia EXAM: PORTABLE CHEST 1 VIEW COMPARISON:  March 15, 2020 FINDINGS: The heart size and mediastinal contours are within normal limits. Mild increased pulmonary interstitium is identified bilaterally. Mild hazy ground-glass opacities identified in the right lung base. There is no pleural effusion. The visualized skeletal structures are unremarkable. IMPRESSION: Mild increased pulmonary interstitium identified bilaterally, this can be seen in mild edema or viral pneumonitis. Mild hazy ground-glass opacities identified in the right lung base, superimposed pneumonia is not excluded. Electronically Signed   By: Abelardo Diesel M.D.   On: 08/04/2021 16:57   ECHOCARDIOGRAM COMPLETE  Result Date: 07/23/2021    ECHOCARDIOGRAM REPORT   Patient Name:   Pasqual KHAMRON GELLERT Date of Exam: 07/23/2021 Medical Rec #:  [id number]         Height:       68.0 in Accession #:    [id number]        Weight:       180.0 lb Date of Birth:  [birth year]         BSA:          1.954 m Patient Age:    76 years          BP:           88/61 mmHg Patient Gender: M                 HR:           100 bpm. Exam Location:  Inpatient Procedure: 2D Echo, Cardiac Doppler and Color Doppler Indications:    NSTEMI  History:        Patient has prior history of Echocardiogram examinations, most                 recent 04/02/2020. Signs/Symptoms:Murmur. GERD / HLD.  Sonographer:    Beryle Beams Referring Phys: [id number] [place]  1. Left ventricular ejection fraction, by estimation, is 65 to 70%. The left ventricle has normal function. The left ventricle has no regional wall motion abnormalities. There is mild concentric left ventricular hypertrophy. Left  ventricular diastolic parameters were normal.  2. Right ventricular systolic function is normal. The right ventricular size is normal. Tricuspid regurgitation signal is inadequate for assessing PA pressure.  3. The mitral valve is grossly normal. Trivial mitral valve regurgitation. No evidence of mitral stenosis.  4. The aortic valve is tricuspid. There is mild calcification of the aortic valve. There is mild thickening of the aortic valve. Aortic valve regurgitation is not visualized. Mild aortic valve stenosis. Aortic valve mean gradient measures 15.7 mmHg. Aortic valve Vmax measures 2.71 m/s.  5. The inferior vena cava is normal in size with greater than 50% respiratory variability, suggesting right atrial pressure of 3 mmHg. FINDINGS  Left Ventricle: Left ventricular ejection fraction, by estimation, is 65 to 70%. The left ventricle has normal function. The left ventricle has no regional wall motion abnormalities. The left ventricular internal cavity size was normal in size. There is  mild concentric left ventricular hypertrophy. Left ventricular diastolic parameters were normal. Right Ventricle: The right ventricular size is normal. No increase in right ventricular wall thickness. Right ventricular systolic function is normal. Tricuspid regurgitation signal is inadequate for assessing PA pressure. Left Atrium: Left atrial size was normal in size. Right Atrium: Right atrial size was normal in size. Pericardium: There is no evidence of pericardial effusion. Mitral Valve: The mitral valve is grossly normal. Trivial mitral valve regurgitation. No evidence of mitral valve stenosis. Tricuspid Valve: The tricuspid valve is grossly normal. Tricuspid valve regurgitation is trivial. No evidence of tricuspid stenosis. Aortic Valve: The aortic valve is tricuspid. There is mild calcification of the aortic valve. There is mild thickening of the aortic valve. Aortic valve regurgitation is not visualized. Mild aortic stenosis  is present. Aortic valve mean gradient measures  15.7 mmHg. Aortic valve peak gradient measures 29.5 mmHg. Aortic valve area, by VTI measures 1.78 cm. Pulmonic Valve: The pulmonic valve was grossly normal. Pulmonic valve regurgitation is not visualized. No evidence of pulmonic stenosis. Aorta: The aortic root and ascending aorta are structurally normal, with no evidence of dilitation. Venous: The inferior vena cava is normal in size with greater than 50% respiratory variability, suggesting right atrial pressure of 3 mmHg. IAS/Shunts: The atrial septum is grossly normal.  LEFT VENTRICLE PLAX 2D LVIDd:         3.20 cm     Diastology LVIDs:         2.10 cm     LV e' medial:    8.21 cm/s LV PW:         1.10 cm     LV E/e' medial:  11.0 LV IVS:        1.20 cm     LV e' lateral:   9.70 cm/s LVOT diam:     2.20 cm     LV E/e' lateral: 9.3 LV SV:         86 LV SV Index:   44 LVOT Area:     3.80 cm  LV Volumes (MOD)  LV vol d, MOD A4C: 81.0 ml LV vol s, MOD A2C: 74.8 ml LV vol s, MOD A4C: 34.9 ml LV SV MOD A4C:     81.0 ml RIGHT VENTRICLE RV S prime:     24.80 cm/s TAPSE (M-mode): 1.8 cm LEFT ATRIUM           Index        RIGHT ATRIUM           Index LA diam:      4.20 cm 2.15 cm/m   RA Area:     11.70 cm LA Vol (A2C): 91.3 ml 46.72 ml/m  RA Volume:   21.30 ml  10.90 ml/m LA Vol (A4C): 44.0 ml 22.52 ml/m  AORTIC VALVE                     PULMONIC VALVE AV Area (Vmax):    1.68 cm      PV Vmax:       0.85 m/s AV Area (Vmean):   1.63 cm      PV Vmean:      58.400 cm/s AV Area (VTI):     1.78 cm      PV VTI:        0.176 m AV Vmax:           271.47 cm/s   PV Peak grad:  2.9 mmHg AV Vmean:          179.021 cm/s  PV Mean grad:  2.0 mmHg AV VTI:            0.483 m AV Peak Grad:      29.5 mmHg AV Mean Grad:      15.7 mmHg LVOT Vmax:         120.00 cm/s LVOT Vmean:        76.600 cm/s LVOT VTI:          0.226 m LVOT/AV VTI ratio: 0.47  AORTA Ao Root diam: 2.90 cm Ao Asc diam:  3.20 cm MITRAL VALVE MV Area (PHT): 3.91 cm     SHUNTS MV Decel Time: 194 msec    Systemic VTI:  0.23 m MV E velocity: 90.40 cm/s  Systemic Diam: 2.20 cm MV A velocity: 50.10 cm/s MV E/A ratio:  1.80 Eleonore Chiquito MD Electronically signed by Eleonore Chiquito MD Signature Date/Time: 07/23/2021/3:01:19 PM    Final     Microbiology Recent Results (from the past 240 hour(s))  Resp Panel by RT-PCR (Flu A&B, Covid) Nasopharyngeal Swab     Status: None   Collection Time: 07/31/2021  5:01 PM   Specimen: Nasopharyngeal Swab; Nasopharyngeal(NP) swabs in vial transport medium  Result Value Ref Range Status   SARS Coronavirus 2 by RT PCR NEGATIVE NEGATIVE Final    Comment: (NOTE) SARS-CoV-2 target nucleic acids are NOT DETECTED.  The SARS-CoV-2 RNA is generally detectable in upper respiratory specimens during the acute phase of infection. The lowest concentration of SARS-CoV-2 viral copies this assay can detect is 138 copies/mL. A negative result does not preclude SARS-Cov-2 infection and should not be used as the sole basis for treatment or other patient management decisions. A negative result may occur with  improper specimen collection/handling, submission of specimen other than nasopharyngeal swab, presence of viral mutation(s) within the areas targeted by this assay, and inadequate number of viral copies(<138 copies/mL). A negative result must be combined with clinical observations, patient history, and epidemiological information. The expected result is Negative.  Fact Sheet for Patients:  EntrepreneurPulse.com.au  Fact Sheet for Healthcare Providers:  IncredibleEmployment.be  This test is no t yet approved or cleared by the [place] FDA and  has been authorized for detection and/or diagnosis of SARS-CoV-2 by FDA under an Emergency Use Authorization (EUA). This EUA will remain  in effect (meaning this test can be used) for the duration of the COVID-19 declaration under Section 564(b)(1) of the Act,  21 U.S.C.section 360bbb-3(b)(1), unless the authorization is terminated  or revoked sooner.       Influenza A by PCR NEGATIVE NEGATIVE Final   Influenza B by PCR NEGATIVE NEGATIVE Final    Comment: (NOTE) The Xpert Xpress SARS-CoV-2/FLU/RSV plus assay is intended as an aid in the diagnosis of influenza from Nasopharyngeal swab specimens and should not be used as a sole basis for treatment. Nasal washings and aspirates are unacceptable for Xpert Xpress SARS-CoV-2/FLU/RSV testing.  Fact Sheet for Patients: EntrepreneurPulse.com.au  Fact Sheet for Healthcare Providers: IncredibleEmployment.be  This test is not yet approved or cleared by the [place] FDA and has been authorized for detection and/or diagnosis of SARS-CoV-2 by FDA under an Emergency Use Authorization (EUA). This EUA will remain in effect (meaning this test can be used) for the duration of the COVID-19 declaration under Section 564(b)(1) of the Act, 21 U.S.C. section 360bbb-3(b)(1), unless the authorization is terminated or revoked.  Performed at [facility], [place] [address] [place], [place] [address]   MRSA Next Gen by PCR, Nasal     Status: None   Collection Time: 07/26/21 12:17 PM   Specimen: Nasal Mucosa; Nasal Swab  Result Value Ref Range Status   MRSA by PCR Next Gen NOT DETECTED NOT DETECTED Final    Comment: (NOTE) The GeneXpert MRSA Assay (FDA approved for NASAL specimens only), is one component of a comprehensive MRSA colonization surveillance program. It is not intended to diagnose MRSA infection nor to guide or monitor treatment for MRSA infections. Test performance is not FDA approved in patients less than 64 years old. Performed at [facility], [place] [address] [place], [place] [address]   Culture, blood (routine x 2)     Status: None (Preliminary result)   Collection Time: 07/26/21  1:32 PM   Specimen: BLOOD LEFT  FOREARM  Result Value Ref Range Status   Specimen Description   Final    BLOOD LEFT FOREARM Performed at [facility] [address], [place], [place] [address]    Special Requests   Final    BOTTLES DRAWN AEROBIC ONLY Blood Culture adequate volume Performed at [facility] [address], [place], [place] [address]    Culture   Final    NO GROWTH 4 DAYS Performed at [facility], [place] [address], [place], [place] [address]    Report Status PENDING  Incomplete  Culture, blood (routine x 2)     Status: None (Preliminary result)   Collection Time: 07/26/21  1:32 PM   Specimen: Left Antecubital; Blood  Result Value Ref Range Status   Specimen Description   Final    LEFT ANTECUBITAL Performed at [facility] [address], [place], [place] [address]    Special Requests   Final    Blood Culture adequate volume BOTTLES DRAWN AEROBIC ONLY Performed at [facility] [address], [place],  [address]    Culture   Final    NO GROWTH 4 DAYS Performed at [facility],  [address] [place], [place] [address]    Report Status PENDING  Incomplete    Lab Basic Metabolic Panel: Recent Labs  Lab 07/26/21 0318 07/26/21 1332 07/27/21 0253  NA 131* 130* 133*  K 3.7 3.9 4.0  CL 97* 98 101  CO2 27 27 24   GLUCOSE 116* 123* 98  BUN 24* 23 21  CREATININE 1.08 1.36* 1.26*  CALCIUM 10.8* 9.9 10.6*  MG  --  1.8  --    Liver Function Tests: Recent Labs  Lab 07/26/21 0318  AST 27  ALT 30  ALKPHOS 68  BILITOT 0.5  PROT 6.0*  ALBUMIN 2.7*   No results for input(s): LIPASE, AMYLASE in the last 168 hours. No results for input(s): AMMONIA in the last 168 hours. CBC: Recent Labs  Lab 07/24/21 0041 07/25/21 0303 07/26/21 0318 07/26/21 1332 07/27/21 0253  WBC 8.1 8.8 8.5 10.7* 10.6*  HGB 10.5* 11.2* 11.5* 10.5* 10.8*  HCT 31.4* 33.2* 34.7* 31.9* 33.0*  MCV 94.0 93.3 94.6 94.4  94.6  PLT 218 251 249 277 331   Cardiac Enzymes: No results for input(s): CKTOTAL, CKMB, CKMBINDEX, TROPONINI in the last 168 hours. Sepsis Labs: Recent Labs  Lab 07/25/21 0303 07/26/21 0318 07/26/21 1332 07/26/21 1643 07/27/21 0253  WBC 8.8 8.5 10.7*  --  10.6*  LATICACIDVEN  --   --  1.4 1.2  --     Procedures/Operations  As per EMR   Bonna Gains Damiya Sandefur 07/30/2021, 8:28 AM

## 2021-08-07 DEATH — deceased

## 2021-08-08 ENCOUNTER — Ambulatory Visit: Payer: BLUE CROSS/BLUE SHIELD | Admitting: Oncology

## 2021-08-08 ENCOUNTER — Other Ambulatory Visit: Payer: BLUE CROSS/BLUE SHIELD

## 2021-08-09 ENCOUNTER — Ambulatory Visit: Payer: BLUE CROSS/BLUE SHIELD | Admitting: Oncology

## 2021-08-09 ENCOUNTER — Other Ambulatory Visit: Payer: BLUE CROSS/BLUE SHIELD

## 2021-08-12 ENCOUNTER — Ambulatory Visit: Payer: Medicare Other | Admitting: Physical Therapy

## 2021-08-14 ENCOUNTER — Ambulatory Visit: Payer: BLUE CROSS/BLUE SHIELD | Admitting: Physician Assistant

## 2021-08-15 ENCOUNTER — Encounter: Payer: BLUE CROSS/BLUE SHIELD | Admitting: Physical Therapy

## 2021-08-19 ENCOUNTER — Ambulatory Visit: Payer: Medicare Other | Admitting: Physical Therapy

## 2021-08-22 ENCOUNTER — Encounter: Payer: BLUE CROSS/BLUE SHIELD | Admitting: Physical Therapy

## 2021-08-26 ENCOUNTER — Encounter: Payer: BLUE CROSS/BLUE SHIELD | Admitting: Physical Therapy

## 2021-08-28 ENCOUNTER — Ambulatory Visit: Payer: Self-pay | Admitting: Urology

## 2021-08-29 ENCOUNTER — Encounter: Payer: BLUE CROSS/BLUE SHIELD | Admitting: Physical Therapy

## 2021-09-02 ENCOUNTER — Encounter: Payer: BLUE CROSS/BLUE SHIELD | Admitting: Physical Therapy

## 2021-09-05 ENCOUNTER — Encounter: Payer: BLUE CROSS/BLUE SHIELD | Admitting: Physical Therapy

## 2021-09-09 ENCOUNTER — Encounter: Payer: BLUE CROSS/BLUE SHIELD | Admitting: Physical Therapy

## 2021-09-12 ENCOUNTER — Encounter: Payer: BLUE CROSS/BLUE SHIELD | Admitting: Physical Therapy

## 2021-09-26 ENCOUNTER — Ambulatory Visit: Payer: BLUE CROSS/BLUE SHIELD | Admitting: Physician Assistant
